# Patient Record
Sex: Male | Born: 1950 | Race: Black or African American | Hispanic: No | Marital: Married | Smoking: Never smoker
Health system: Southern US, Community
[De-identification: ages and names within clinical notes are randomized; demographics above are authoritative.]

## PROBLEM LIST (undated history)

## (undated) DIAGNOSIS — E119 Type 2 diabetes mellitus without complications: Secondary | ICD-10-CM

## (undated) DIAGNOSIS — G4730 Sleep apnea, unspecified: Secondary | ICD-10-CM

## (undated) DIAGNOSIS — C801 Malignant (primary) neoplasm, unspecified: Secondary | ICD-10-CM

## (undated) DIAGNOSIS — M109 Gout, unspecified: Secondary | ICD-10-CM

## (undated) DIAGNOSIS — E785 Hyperlipidemia, unspecified: Secondary | ICD-10-CM

## (undated) DIAGNOSIS — I10 Essential (primary) hypertension: Secondary | ICD-10-CM

## (undated) HISTORY — DX: Hyperlipidemia, unspecified: E78.5

## (undated) HISTORY — DX: Gout, unspecified: M10.9

## (undated) HISTORY — DX: Type 2 diabetes mellitus without complications: E11.9

## (undated) HISTORY — DX: Sleep apnea, unspecified: G47.30

## (undated) HISTORY — PX: CIRCUMCISION: SUR203

## (undated) HISTORY — PX: PROSTATE SURGERY: SHX751

## (undated) HISTORY — DX: Essential (primary) hypertension: I10

## (undated) HISTORY — DX: Malignant (primary) neoplasm, unspecified: C80.1

---

## 2001-05-22 HISTORY — PX: OTHER SURGICAL HISTORY: SHX169

## 2002-05-22 HISTORY — PX: TONSILLECTOMY AND ADENOIDECTOMY: SHX28

## 2004-10-24 ENCOUNTER — Other Ambulatory Visit: Payer: Self-pay

## 2004-10-24 ENCOUNTER — Emergency Department: Payer: Self-pay | Admitting: Emergency Medicine

## 2005-11-21 ENCOUNTER — Emergency Department: Payer: Self-pay | Admitting: Emergency Medicine

## 2006-04-10 ENCOUNTER — Ambulatory Visit: Payer: Self-pay | Admitting: Unknown Physician Specialty

## 2006-12-13 ENCOUNTER — Ambulatory Visit: Payer: Self-pay | Admitting: Unknown Physician Specialty

## 2009-02-08 ENCOUNTER — Ambulatory Visit: Payer: Self-pay | Admitting: Family Medicine

## 2009-03-30 ENCOUNTER — Emergency Department: Payer: Self-pay | Admitting: Emergency Medicine

## 2010-04-04 ENCOUNTER — Emergency Department: Payer: Self-pay | Admitting: Emergency Medicine

## 2010-04-20 ENCOUNTER — Ambulatory Visit: Payer: Self-pay | Admitting: Urology

## 2010-05-10 ENCOUNTER — Ambulatory Visit: Payer: Self-pay | Admitting: Gastroenterology

## 2010-05-18 LAB — PATHOLOGY REPORT

## 2010-06-28 ENCOUNTER — Ambulatory Visit: Payer: Self-pay | Admitting: Unknown Physician Specialty

## 2011-05-23 DIAGNOSIS — C801 Malignant (primary) neoplasm, unspecified: Secondary | ICD-10-CM

## 2011-05-23 HISTORY — PX: PROSTATECTOMY: SHX69

## 2011-05-23 HISTORY — DX: Malignant (primary) neoplasm, unspecified: C80.1

## 2012-09-25 ENCOUNTER — Ambulatory Visit: Payer: Self-pay | Admitting: Radiation Oncology

## 2012-09-26 LAB — PSA: PSA: <0.1 ng/mL (ref 0.0–4.0)

## 2012-10-17 LAB — CBC CANCER CENTER
Basophil #: 0.1 x10 3/mm (ref 0.0–0.1)
Basophil %: 0.8 %
Eosinophil #: 0.2 x10 3/mm (ref 0.0–0.7)
Eosinophil %: 2.7 %
HCT: 34.7 % — ABNORMAL LOW (ref 40.0–52.0)
HGB: 11.5 g/dL — ABNORMAL LOW (ref 13.0–18.0)
Lymphocyte #: 1.8 x10 3/mm (ref 1.0–3.6)
Lymphocyte %: 20.2 %
MCH: 27.2 pg (ref 26.0–34.0)
MCHC: 33.1 g/dL (ref 32.0–36.0)
MCV: 82 fL (ref 80–100)
Monocyte #: 0.8 x10 3/mm (ref 0.2–1.0)
Monocyte %: 9.1 %
Neutrophil #: 6 x10 3/mm (ref 1.4–6.5)
Neutrophil %: 67.2 %
Platelet: 241 x10 3/mm (ref 150–440)
RBC: 4.22 10*6/uL — ABNORMAL LOW (ref 4.40–5.90)
RDW: 16.7 % — ABNORMAL HIGH (ref 11.5–14.5)
WBC: 8.9 x10 3/mm (ref 3.8–10.6)

## 2012-10-20 ENCOUNTER — Ambulatory Visit: Payer: Self-pay | Admitting: Radiation Oncology

## 2012-10-24 LAB — CBC CANCER CENTER
Basophil %: 1 %
Eosinophil #: 0.3 x10 3/mm (ref 0.0–0.7)
Eosinophil %: 4.1 %
HCT: 36 % — ABNORMAL LOW (ref 40.0–52.0)
Lymphocyte #: 1.4 x10 3/mm (ref 1.0–3.6)
Monocyte %: 8.5 %
Neutrophil %: 67.7 %
Platelet: 231 x10 3/mm (ref 150–440)
RDW: 17.6 % — ABNORMAL HIGH (ref 11.5–14.5)
WBC: 7.4 x10 3/mm (ref 3.8–10.6)

## 2012-10-26 ENCOUNTER — Emergency Department: Payer: Self-pay | Admitting: Emergency Medicine

## 2012-10-26 LAB — CBC WITH DIFFERENTIAL/PLATELET
Eosinophil #: 0.1 10*3/uL (ref 0.0–0.7)
Lymphocyte %: 6.4 %
MCHC: 33.5 g/dL (ref 32.0–36.0)
Monocyte #: 1.2 x10 3/mm — ABNORMAL HIGH (ref 0.2–1.0)
Monocyte %: 8.1 %
Neutrophil #: 12.1 10*3/uL — ABNORMAL HIGH (ref 1.4–6.5)
RBC: 4.22 10*6/uL — ABNORMAL LOW (ref 4.40–5.90)
RDW: 17.8 % — ABNORMAL HIGH (ref 11.5–14.5)

## 2012-10-26 LAB — URINALYSIS, COMPLETE
Bilirubin,UR: NEGATIVE
Glucose,UR: NEGATIVE mg/dL (ref 0–75)
Nitrite: NEGATIVE
Protein: NEGATIVE
RBC,UR: 11 /HPF (ref 0–5)
Specific Gravity: 1.014 (ref 1.003–1.030)
Squamous Epithelial: <1
WBC UR: 38 /HPF (ref 0–5)

## 2012-10-26 LAB — BASIC METABOLIC PANEL
BUN: 24 mg/dL — ABNORMAL HIGH (ref 7–18)
Calcium, Total: 9.2 mg/dL (ref 8.5–10.1)
Chloride: 104 mmol/L (ref 98–107)
Co2: 27 mmol/L (ref 21–32)
Creatinine: 1.49 mg/dL — ABNORMAL HIGH (ref 0.60–1.30)
EGFR (Non-African Amer.): 50 — ABNORMAL LOW
Osmolality: 280 (ref 275–301)
Potassium: 3.9 mmol/L (ref 3.5–5.1)

## 2012-10-29 LAB — URINE CULTURE

## 2012-10-31 LAB — CBC CANCER CENTER
Basophil #: 0 x10 3/mm (ref 0.0–0.1)
Eosinophil #: 0.2 x10 3/mm (ref 0.0–0.7)
HGB: 11.4 g/dL — ABNORMAL LOW (ref 13.0–18.0)
Lymphocyte #: 1.2 x10 3/mm (ref 1.0–3.6)
MCH: 28.1 pg (ref 26.0–34.0)
MCHC: 34.3 g/dL (ref 32.0–36.0)
MCV: 82 fL (ref 80–100)
Neutrophil #: 4.7 x10 3/mm (ref 1.4–6.5)
Platelet: 237 x10 3/mm (ref 150–440)
RDW: 17.1 % — ABNORMAL HIGH (ref 11.5–14.5)

## 2012-11-01 LAB — CULTURE, BLOOD (SINGLE)

## 2012-11-07 LAB — CBC CANCER CENTER
Eosinophil %: 3.4 %
HCT: 34.1 % — ABNORMAL LOW (ref 40.0–52.0)
HGB: 11.6 g/dL — ABNORMAL LOW (ref 13.0–18.0)
Lymphocyte #: 1.1 x10 3/mm (ref 1.0–3.6)
MCHC: 34 g/dL (ref 32.0–36.0)
MCV: 83 fL (ref 80–100)
Monocyte #: 0.6 x10 3/mm (ref 0.2–1.0)
Monocyte %: 7.8 %
Platelet: 246 x10 3/mm (ref 150–440)
RBC: 4.11 10*6/uL — ABNORMAL LOW (ref 4.40–5.90)
RDW: 17.7 % — ABNORMAL HIGH (ref 11.5–14.5)
WBC: 7.1 x10 3/mm (ref 3.8–10.6)

## 2012-11-14 LAB — CBC CANCER CENTER
Basophil #: 0 x10 3/mm (ref 0.0–0.1)
Eosinophil %: 2.8 %
HCT: 34.5 % — ABNORMAL LOW (ref 40.0–52.0)
Lymphocyte #: 0.8 x10 3/mm — ABNORMAL LOW (ref 1.0–3.6)
MCH: 28.3 pg (ref 26.0–34.0)
MCHC: 34.1 g/dL (ref 32.0–36.0)
MCV: 83 fL (ref 80–100)
Monocyte %: 8.9 %
Platelet: 211 x10 3/mm (ref 150–440)
RDW: 17.9 % — ABNORMAL HIGH (ref 11.5–14.5)
WBC: 5.9 x10 3/mm (ref 3.8–10.6)

## 2012-11-19 ENCOUNTER — Ambulatory Visit: Payer: Self-pay | Admitting: Radiation Oncology

## 2012-11-21 LAB — CBC CANCER CENTER
Basophil %: 0.6 %
HCT: 33.9 % — ABNORMAL LOW (ref 40.0–52.0)
HGB: 11.5 g/dL — ABNORMAL LOW (ref 13.0–18.0)
Lymphocyte #: 0.9 x10 3/mm — ABNORMAL LOW (ref 1.0–3.6)
MCH: 28.5 pg (ref 26.0–34.0)
MCV: 84 fL (ref 80–100)
Monocyte %: 11.4 %
Neutrophil #: 4.8 x10 3/mm (ref 1.4–6.5)
Neutrophil %: 72.5 %
Platelet: 180 x10 3/mm (ref 150–440)
RBC: 4.05 10*6/uL — ABNORMAL LOW (ref 4.40–5.90)
WBC: 6.6 x10 3/mm (ref 3.8–10.6)

## 2012-11-28 LAB — CBC CANCER CENTER
Basophil #: 0 x10 3/mm (ref 0.0–0.1)
Basophil %: 0.6 %
Eosinophil #: 0.2 x10 3/mm (ref 0.0–0.7)
Lymphocyte #: 0.8 x10 3/mm — ABNORMAL LOW (ref 1.0–3.6)
MCV: 84 fL (ref 80–100)
Monocyte #: 0.7 x10 3/mm (ref 0.2–1.0)
Neutrophil #: 6.2 x10 3/mm (ref 1.4–6.5)
RDW: 17.5 % — ABNORMAL HIGH (ref 11.5–14.5)
WBC: 7.9 x10 3/mm (ref 3.8–10.6)

## 2012-12-20 ENCOUNTER — Ambulatory Visit: Payer: Self-pay | Admitting: Radiation Oncology

## 2013-05-06 ENCOUNTER — Ambulatory Visit: Payer: Self-pay | Admitting: Radiation Oncology

## 2013-05-08 LAB — PSA: PSA: <0.1 ng/mL (ref 0.0–4.0)

## 2013-05-22 ENCOUNTER — Ambulatory Visit: Payer: Self-pay | Admitting: Radiation Oncology

## 2013-05-22 HISTORY — PX: CYST EXCISION: SHX5701

## 2013-11-04 ENCOUNTER — Ambulatory Visit: Payer: Self-pay | Admitting: Radiation Oncology

## 2013-11-06 LAB — PSA: PSA: <0.1 ng/mL (ref 0.0–4.0)

## 2013-11-19 ENCOUNTER — Ambulatory Visit: Payer: Self-pay | Admitting: Radiation Oncology

## 2014-05-22 HISTORY — PX: TOTAL HIP ARTHROPLASTY: SHX124

## 2014-05-22 HISTORY — PX: JOINT REPLACEMENT: SHX530

## 2014-09-11 NOTE — Consult Note (Signed)
Reason for Visit: This 64 year old Male patient presents to the clinic for initial evaluation of  prostate cancer .   Referred by to Dr. Arrie Senate Department of radiation oncology.  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 Bernardo Heater for several years for rising PSA which began 2000 well. Eventually came to biopsy was found to have Gleason score of 7 (4+3) adenocarcinoma of the prostate. Was seen at [facility] and underwent a laparoscopic assisted robotic prostatectomy in June 07, 2012. 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 Patient has had postop urinary incontinence improving over time although still uses a pad. He was seen by radiation oncology at [facility] and recommended to have salvage radiation therapy. He is seen today closer to home for consultation. He has not had postop PSA check. He is doing fairly well. He is having no diarrhea. Still has urinary incontinence as described above. Does have trouble obtaining an erection at this time. This  Past Hx:    Prostate Cancer:    Hypercholesterolemia:    Diabetes:    htn:    Tonsillectomy:   Past, Family and Social History:  Past Medical History positive   Cardiovascular hyperlipidemia; hypertension   Endocrine diabetes mellitus   Past Surgical History tonsillectomy   Past Medical History Comments morbid obesity   Family History positive   Family History Comments family history positive for lung cancer, prostate cancer breast cancer and hypertension.   Social  History noncontributory   Additional Past Medical and Surgical History seen by himself today   Allergies:   No Known Allergies:   Home Meds:  Home Medications: Medication Instructions Status  Humulin 70/30 subcutaneous suspension 100 units subcutaneous 2 times a day  Active  ibuprofen 800 mg oral tablet 1 tab(s) orally 3 times a day as needed for pain.  Active  carvedilol 25 mg oral tablet 1   2 times a day Active  simvastatin 20 mg oral tablet 1 tab(s) orally once a day (at bedtime) Active  clonidine 0.2 mg oral tablet 1  orally 2 times a day Active  Levemir 100 units/mL subcutaneous solution 50 unit(s) subcutaneous once a day (at bedtime) Active  allopurinol 300 mg oral tablet 1 tab(s) orally once a day Active  Tribenzor 10 mg-25 mg-40 mg oral tablet 1 tab(s) orally once a day Active   Review of Systems:  General negative   Performance Status (ECOG) 0   Skin negative   Breast negative   Ophthalmologic negative   ENMT negative   Respiratory and Thorax negative   Cardiovascular negative   Gastrointestinal negative   Genitourinary see HPI   Musculoskeletal negative   Neurological negative   Hematology/Lymphatics negative   Endocrine negative   Allergic/Immunologic negative   Nursing Notes:  Nursing Vital Signs and Chemo Nursing Nursing Notes: *CC Vital Signs Flowsheet:   07-May-14 14:12  Temp Temperature 99.6  Pulse Pulse 84  Respirations Respirations 20  SBP SBP 161  DBP DBP 86  Pain Scale (0-10)  0  Current Weight (kg) (kg) 145.8  Height (  cm) centimeters 188  BSA (m2) 2.6   Physical Exam:  General/Skin/HEENT:  Skin normal   Eyes normal   ENMT normal   Head and Neck normal   Additional PE well-developed morbidly obese male in NAD. No cervical or supraclavicular adenopathy is identified lungs are clear to A&P cardiac examination shows regular rate and rhythm. Abdomen is obese no organomegaly or masses are noted. On rectal exam rectal  sphincter tone is good. Prostate fossa is clear without evidence of nodularity or mass.   Breasts/Resp/CV/GI/GU:  Respiratory and Thorax normal   Cardiovascular normal   Gastrointestinal normal   Genitourinary normal   MS/Neuro/Psych/Lymph:  Musculoskeletal normal   Neurological normal   Lymphatics normal   Assessment and Plan: Impression:   locally advanced stage III adenocarcinoma prostate status post prostatectomy in 64 year old male Plan:   at this time I am attempting to obtain the official pathology report as well as operative report and clinical notes from urology at [facility]. From what I am reviewing at this time do not see an indication for treating his pelvic nodes. Would follow [place] guidelines and treat his prostatic fossa up to approximately 7000 cGy over 7 weeks using IMRT treatment planning and delivery. I've also obtained a PSA on him today. Risks and benefits of treatment including possible worsening of his urinary incontinence, increasing in severity of frequency and urgency and nocturia, possible diarrhea, possible fatigue, and possible alteration of blood counts were all explained in detail to the patient. He seems to comprehend her treatment plan well. I have set him up for CT simulation early next week.  I would like to take this opportunity to thank you for allowing me to continue to participate in this patient's care.  CC Referral:  cc: Dr. Bridgett Larsson Department radiation oncology [facility], Dr. Lottie Rater Department of urology [facility], Dr. Bernardo Heater, Dr. Golden Pop   Electronic Signatures: Armstead Peaks (MD)  (Signed 07-May-14 15:23)  Authored: HPI, Diagnosis, Past Hx, PFSH, Allergies, Home Meds, ROS, Nursing Notes, Physical Exam, Encounter Assessment and Plan, CC Referring Physician   Last Updated: 07-May-14 15:23 by Armstead Peaks (MD)

## 2014-11-03 ENCOUNTER — Other Ambulatory Visit: Payer: Self-pay | Admitting: Family Medicine

## 2014-11-09 ENCOUNTER — Other Ambulatory Visit: Payer: Self-pay | Admitting: *Deleted

## 2014-11-09 ENCOUNTER — Ambulatory Visit: Payer: BLUE CROSS/BLUE SHIELD | Attending: Radiation Oncology | Admitting: Radiation Oncology

## 2014-11-09 ENCOUNTER — Inpatient Hospital Stay: Payer: BLUE CROSS/BLUE SHIELD | Attending: Radiation Oncology

## 2014-11-09 DIAGNOSIS — C61 Malignant neoplasm of prostate: Secondary | ICD-10-CM

## 2014-11-16 DIAGNOSIS — E785 Hyperlipidemia, unspecified: Secondary | ICD-10-CM | POA: Insufficient documentation

## 2014-11-16 DIAGNOSIS — I10 Essential (primary) hypertension: Secondary | ICD-10-CM | POA: Insufficient documentation

## 2014-11-16 DIAGNOSIS — E1159 Type 2 diabetes mellitus with other circulatory complications: Secondary | ICD-10-CM | POA: Insufficient documentation

## 2014-11-16 DIAGNOSIS — I152 Hypertension secondary to endocrine disorders: Secondary | ICD-10-CM | POA: Insufficient documentation

## 2014-11-16 DIAGNOSIS — E1169 Type 2 diabetes mellitus with other specified complication: Secondary | ICD-10-CM | POA: Insufficient documentation

## 2014-11-17 ENCOUNTER — Ambulatory Visit (INDEPENDENT_AMBULATORY_CARE_PROVIDER_SITE_OTHER): Payer: BLUE CROSS/BLUE SHIELD | Admitting: Family Medicine

## 2014-11-17 ENCOUNTER — Encounter: Payer: Self-pay | Admitting: Family Medicine

## 2014-11-17 VITALS — BP 134/74 | HR 73 | Temp 98.4°F | Ht 74.5 in | Wt 328.0 lb

## 2014-11-17 DIAGNOSIS — R310 Gross hematuria: Secondary | ICD-10-CM

## 2014-11-17 DIAGNOSIS — R319 Hematuria, unspecified: Secondary | ICD-10-CM

## 2014-11-17 LAB — UA/M W/RFLX CULTURE, ROUTINE
Bilirubin, UA: NEGATIVE
GLUCOSE, UA: NEGATIVE
KETONES UA: NEGATIVE
Leukocytes, UA: NEGATIVE
Nitrite, UA: NEGATIVE
Protein, UA: NEGATIVE
SPEC GRAV UA: 1.02 (ref 1.005–1.030)
UUROB: 0.2 mg/dL (ref 0.2–1.0)
pH, UA: 6 (ref 5.0–7.5)

## 2014-11-17 LAB — MICROSCOPIC EXAMINATION: WBC, UA: NONE SEEN /hpf (ref 0–?)

## 2014-11-17 NOTE — Progress Notes (Signed)
   BP 134/74 mmHg  Pulse 73  Temp(Src) 98.4 F (36.9 C)  Ht 6' 2.5" (1.892 m)  Wt 328 lb (148.78 kg)  BMI 41.56 kg/m2  SpO2 98%   Subjective:    Patient ID: Aaron [name], male    DOB: 1951/04/21, 64 y.o.   MRN: [id number]  HPI: Aaron [name] is a 64 y.o. male  Chief Complaint  Patient presents with  . Hematuria  Painless blood with no sx 2 times yesterday blood gross and in PM no blood No blood in semen  No back pain or GU pain  No trauma Had after prostate cancer surger and tx for UTI Had radiation 35 tx No outside lesions No UTI sx   Relevant past medical, surgical, family and social history reviewed and updated as indicated. Interim medical history since our last visit reviewed. Allergies and medications reviewed and updated.  Review of Systems  Constitutional: Negative.   Respiratory: Negative.   Cardiovascular: Negative.     Per HPI unless specifically indicated above     Objective:    BP 134/74 mmHg  Pulse 73  Temp(Src) 98.4 F (36.9 C)  Ht 6' 2.5" (1.892 m)  Wt 328 lb (148.78 kg)  BMI 41.56 kg/m2  SpO2 98%  Wt Readings from Last 3 Encounters:  11/17/14 328 lb (148.78 kg)  09/24/14 330 lb (149.687 kg)    Physical Exam  Constitutional: He is oriented to person, place, and time. He appears well-developed and well-nourished. No distress.  HENT:  Head: Normocephalic and atraumatic.  Right Ear: Hearing normal.  Left Ear: Hearing normal.  Nose: Nose normal.  Eyes: Conjunctivae and lids are normal. Right eye exhibits no discharge. Left eye exhibits no discharge. No scleral icterus.  Pulmonary/Chest: Effort normal. No respiratory distress.  Genitourinary: Testes normal and penis normal. No phimosis, penile erythema or penile tenderness. No discharge found.  Musculoskeletal: Normal range of motion.  Neurological: He is alert and oriented to person, place, and time.  Skin: Skin is intact. No rash noted.  Psychiatric: He has a normal mood and  affect. His speech is normal and behavior is normal. Judgment and thought content normal. Cognition and memory are normal.    Results for orders placed or performed in visit on 11/04/13  PSA  Result Value Ref Range   PSA <0.1 0.0-4.0 ng/mL      Assessment & Plan:   Problem List Items Addressed This Visit    None    Visit Diagnoses    Hematuria    -  Primary    Relevant Orders    Urinalysis, Routine w reflex microscopic    Urine culture    Ambulatory referral to Urology    Hematuria, gross        pt with no signs or sx now with hx radiation most 7 old radiation injury. will get urine culter and referral to his urology [facility]        Follow up plan: Return if symptoms worsen or fail to improve.

## 2014-11-26 LAB — URINE CULTURE

## 2014-11-30 ENCOUNTER — Other Ambulatory Visit: Payer: Self-pay

## 2014-11-30 ENCOUNTER — Other Ambulatory Visit: Payer: BLUE CROSS/BLUE SHIELD

## 2014-11-30 DIAGNOSIS — R319 Hematuria, unspecified: Secondary | ICD-10-CM

## 2014-12-01 ENCOUNTER — Telehealth: Payer: Self-pay

## 2014-12-02 LAB — URINE CULTURE

## 2014-12-21 DIAGNOSIS — R310 Gross hematuria: Secondary | ICD-10-CM | POA: Insufficient documentation

## 2015-01-05 ENCOUNTER — Telehealth: Payer: Self-pay

## 2015-01-05 ENCOUNTER — Encounter: Payer: Self-pay | Admitting: Family Medicine

## 2015-01-05 ENCOUNTER — Ambulatory Visit (INDEPENDENT_AMBULATORY_CARE_PROVIDER_SITE_OTHER): Payer: BLUE CROSS/BLUE SHIELD | Admitting: Family Medicine

## 2015-01-05 VITALS — BP 137/71 | HR 68 | Temp 98.2°F | Ht 74.2 in | Wt 333.0 lb

## 2015-01-05 DIAGNOSIS — E119 Type 2 diabetes mellitus without complications: Secondary | ICD-10-CM

## 2015-01-05 DIAGNOSIS — I10 Essential (primary) hypertension: Secondary | ICD-10-CM

## 2015-01-05 DIAGNOSIS — E785 Hyperlipidemia, unspecified: Secondary | ICD-10-CM

## 2015-01-05 LAB — LP+ALT+AST PICCOLO, WAIVED
ALT (SGPT) PICCOLO, WAIVED: 20 U/L (ref 10–47)
AST (SGOT) PICCOLO, WAIVED: 23 U/L (ref 11–38)
Chol/HDL Ratio Piccolo,Waive: 3.7 mg/dL
Cholesterol Piccolo, Waived: 99 mg/dL (ref <200)
HDL CHOL PICCOLO, WAIVED: 27 mg/dL — AB (ref >59)
LDL Chol Calc Piccolo Waived: 37 mg/dL (ref <100)
Triglycerides Piccolo,Waived: 177 mg/dL — ABNORMAL HIGH (ref <150)
VLDL Chol Calc Piccolo,Waive: 35 mg/dL — ABNORMAL HIGH (ref <30)

## 2015-01-05 LAB — MICROALBUMIN, URINE WAIVED
Creatinine, Urine Waived: 300 mg/dL (ref 10–300)
MICROALB, UR WAIVED: 30 mg/L — AB (ref 0–19)

## 2015-01-05 LAB — BAYER DCA HB A1C WAIVED: HB A1C: 8.2 % — AB (ref <7.0)

## 2015-01-05 MED ORDER — ALLOPURINOL 300 MG PO TABS: 300.0000 mg | ORAL_TABLET | Freq: Every day | ORAL | Status: DC

## 2015-01-05 MED ORDER — DICLOFENAC SODIUM 75 MG PO TBEC: 75.0000 mg | DELAYED_RELEASE_TABLET | Freq: Two times a day (BID) | ORAL | Status: DC

## 2015-01-05 MED ORDER — ALBIGLUTIDE 50 MG ~~LOC~~ PEN: 50.0000 mg | PEN_INJECTOR | SUBCUTANEOUS | Status: DC

## 2015-01-05 MED ORDER — HYDROCHLOROTHIAZIDE 25 MG PO TABS: 25.0000 mg | ORAL_TABLET | Freq: Every day | ORAL | Status: DC

## 2015-01-05 MED ORDER — DAPAGLIFLOZIN PROPANEDIOL 10 MG PO TABS: 10.0000 mg | ORAL_TABLET | Freq: Every day | ORAL | Status: DC

## 2015-01-05 MED ORDER — INSULIN DETEMIR 100 UNIT/ML FLEXPEN: 60.0000 [IU] | PEN_INJECTOR | Freq: Every day | SUBCUTANEOUS | Status: DC

## 2015-01-05 MED ORDER — INSULIN NPH ISOPHANE & REGULAR (70-30) 100 UNIT/ML ~~LOC~~ SUSP: 100.0000 [IU] | Freq: Two times a day (BID) | SUBCUTANEOUS | Status: DC

## 2015-01-05 MED ORDER — ALBIGLUTIDE 50 MG ~~LOC~~ PEN: 50.0000 mg | PEN_INJECTOR | Freq: Every day | SUBCUTANEOUS | Status: DC

## 2015-01-05 MED ORDER — SITAGLIPTIN PHOSPHATE 100 MG PO TABS: 100.0000 mg | ORAL_TABLET | Freq: Every day | ORAL | Status: DC

## 2015-01-05 MED ORDER — LOSARTAN POTASSIUM 100 MG PO TABS: 100.0000 mg | ORAL_TABLET | Freq: Every day | ORAL | Status: DC

## 2015-01-05 MED ORDER — AMLODIPINE BESYLATE 10 MG PO TABS: 10.0000 mg | ORAL_TABLET | Freq: Every day | ORAL | Status: DC

## 2015-01-05 MED ORDER — CARVEDILOL 25 MG PO TABS: 25.0000 mg | ORAL_TABLET | Freq: Two times a day (BID) | ORAL | Status: DC

## 2015-01-05 MED ORDER — ATORVASTATIN CALCIUM 20 MG PO TABS: 20.0000 mg | ORAL_TABLET | Freq: Every day | ORAL | Status: DC

## 2015-01-05 NOTE — Assessment & Plan Note (Signed)
Diabetes with poor control hemoglobin A1c of 8.2 today Will double  down on diet add Farxiga Discuss adjusting daytime insulin

## 2015-01-05 NOTE — Progress Notes (Signed)
BP 137/71 mmHg  Pulse 68  Temp(Src) 98.2 F (36.8 C)  Ht 6' 2.2" (1.885 m)  Wt 333 lb (151.048 kg)  BMI 42.51 kg/m2  SpO2 97%   Subjective:    Patient ID: Aaron [name], male    DOB: 1950/08/26, 64 y.o.   MRN: [id number]  HPI: Aaron [name] is a 64 y.o. male  Chief Complaint  Patient presents with  . Diabetes  . Hyperlipidemia  . Hypertension   Patient follow-up medical problems has been doing stable no complaints from medications no side effects takes faithfully. Diabetes fasting glucose around low 100s. Uses Levemir 50 units. Novolin 70/30 100 units twice a day and blood sugars after meals around mid to upper 100s.  Blood pressure lipids no complaints no problems with medicines and no gout symptoms.  Relevant past medical, surgical, family and social history reviewed and updated as indicated. Interim medical history since our last visit reviewed. Allergies and medications reviewed and updated.  Review of Systems  Constitutional: Negative.   Respiratory: Negative.   Cardiovascular: Negative.     Per HPI unless specifically indicated above     Objective:    BP 137/71 mmHg  Pulse 68  Temp(Src) 98.2 F (36.8 C)  Ht 6' 2.2" (1.885 m)  Wt 333 lb (151.048 kg)  BMI 42.51 kg/m2  SpO2 97%  Wt Readings from Last 3 Encounters:  01/05/15 333 lb (151.048 kg)  11/17/14 328 lb (148.78 kg)  09/24/14 330 lb (149.687 kg)    Physical Exam  Constitutional: He is oriented to person, place, and time. He appears well-developed and well-nourished. No distress.  HENT:  Head: Normocephalic and atraumatic.  Right Ear: Hearing normal.  Left Ear: Hearing normal.  Nose: Nose normal.  Eyes: Conjunctivae and lids are normal. Right eye exhibits no discharge. Left eye exhibits no discharge. No scleral icterus.  Cardiovascular: Normal rate, regular rhythm and normal heart sounds.   Pulmonary/Chest: Effort normal and breath sounds normal. No respiratory distress.   Musculoskeletal: Normal range of motion.  Neurological: He is alert and oriented to person, place, and time.  Skin: Skin is intact. No rash noted.  Psychiatric: He has a normal mood and affect. His speech is normal and behavior is normal. Judgment and thought content normal. Cognition and memory are normal.    Results for orders placed or performed in visit on 11/30/14  Urine Culture  Result Value Ref Range   Urine Culture, Routine Final report (A)    Result 1 Enterococcus species (A)    ANTIMICROBIAL SUSCEPTIBILITY Comment       Assessment & Plan:   Problem List Items Addressed This Visit      Cardiovascular and Mediastinum   Hypertension    The current medical regimen is effective;  continue present plan and medications.       Relevant Medications   amLODipine (NORVASC) 10 MG tablet   atorvastatin (LIPITOR) 20 MG tablet   carvedilol (COREG) 25 MG tablet   hydrochlorothiazide (HYDRODIURIL) 25 MG tablet   losartan (COZAAR) 100 MG tablet     Endocrine   Diabetes mellitus without complication - Primary    Diabetes with poor control hemoglobin A1c of 8.2 today Will double  down on diet add Farxiga Discuss adjusting daytime insulin      Relevant Medications   Albiglutide (TANZEUM) 50 MG PEN   atorvastatin (LIPITOR) 20 MG tablet   Insulin Detemir (LEVEMIR FLEXPEN) 100 UNIT/ML Pen   insulin NPH-regular Human (NOVOLIN 70/30) (  70-30) 100 UNIT/ML injection   losartan (COZAAR) 100 MG tablet   sitaGLIPtin (JANUVIA) 100 MG tablet   dapagliflozin propanediol (FARXIGA) 10 MG TABS tablet   Other Relevant Orders   Bayer DCA Hb A1c Waived   Microalbumin, Urine Waived     Other   Hyperlipidemia    The current medical regimen is effective;  continue present plan and medications.       Relevant Medications   amLODipine (NORVASC) 10 MG tablet   atorvastatin (LIPITOR) 20 MG tablet   carvedilol (COREG) 25 MG tablet   hydrochlorothiazide (HYDRODIURIL) 25 MG tablet   losartan  (COZAAR) 100 MG tablet    Other Visit Diagnoses    Hyperlipemia        Relevant Medications    amLODipine (NORVASC) 10 MG tablet    atorvastatin (LIPITOR) 20 MG tablet    carvedilol (COREG) 25 MG tablet    hydrochlorothiazide (HYDRODIURIL) 25 MG tablet    losartan (COZAAR) 100 MG tablet    Other Relevant Orders    LP+ALT+AST Piccolo, Waived    Essential hypertension, benign        Relevant Medications    amLODipine (NORVASC) 10 MG tablet    atorvastatin (LIPITOR) 20 MG tablet    carvedilol (COREG) 25 MG tablet    hydrochlorothiazide (HYDRODIURIL) 25 MG tablet    losartan (COZAAR) 100 MG tablet    Other Relevant Orders    Basic metabolic panel    Microalbumin, Urine Waived        Follow up plan: Return in about 3 months (around 04/07/2015), or if symptoms worsen or fail to improve, for Physical Exam.

## 2015-01-05 NOTE — Telephone Encounter (Signed)
Requesting a 90 day Rx for patient's Levemir And also need Instructions for Tanzeum Pen - Resend including new directions

## 2015-01-05 NOTE — Assessment & Plan Note (Signed)
The current medical regimen is effective;  continue present plan and medications.  

## 2015-01-05 NOTE — Telephone Encounter (Signed)
New rx for Novolin vial 70/30 mix

## 2015-01-05 NOTE — Telephone Encounter (Signed)
Need 90 day quantity - new rx  [facility]

## 2015-01-06 ENCOUNTER — Encounter: Payer: Self-pay | Admitting: Family Medicine

## 2015-01-06 ENCOUNTER — Telehealth: Payer: Self-pay | Admitting: Family Medicine

## 2015-01-06 ENCOUNTER — Other Ambulatory Visit: Payer: Self-pay | Admitting: Family Medicine

## 2015-01-06 DIAGNOSIS — E119 Type 2 diabetes mellitus without complications: Secondary | ICD-10-CM

## 2015-01-06 LAB — BASIC METABOLIC PANEL
BUN / CREAT RATIO: 12 (ref 10–22)
BUN: 13 mg/dL (ref 8–27)
CALCIUM: 9 mg/dL (ref 8.6–10.2)
CO2: 27 mmol/L (ref 18–29)
CREATININE: 1.06 mg/dL (ref 0.76–1.27)
Chloride: 101 mmol/L (ref 97–108)
GFR calc Af Amer: 87 mL/min/{1.73_m2} (ref >59)
GFR calc non Af Amer: 75 mL/min/{1.73_m2} (ref >59)
GLUCOSE: 107 mg/dL — AB (ref 65–99)
Potassium: 4.1 mmol/L (ref 3.5–5.2)
Sodium: 142 mmol/L (ref 134–144)

## 2015-01-06 MED ORDER — INSULIN NPH ISOPHANE & REGULAR (70-30) 100 UNIT/ML ~~LOC~~ SUSP: 100.0000 [IU] | Freq: Two times a day (BID) | SUBCUTANEOUS | Status: DC

## 2015-01-06 NOTE — Telephone Encounter (Signed)
[facility] has a Careers information officer for [employer], 10 ml would only last pt 5 days. They would like to make sure this is the correct quantity. Thanks.

## 2015-01-12 ENCOUNTER — Other Ambulatory Visit: Payer: Self-pay | Admitting: Family Medicine

## 2015-01-12 DIAGNOSIS — E119 Type 2 diabetes mellitus without complications: Secondary | ICD-10-CM

## 2015-01-12 DIAGNOSIS — M5440 Lumbago with sciatica, unspecified side: Secondary | ICD-10-CM

## 2015-01-12 MED ORDER — INSULIN DETEMIR 100 UNIT/ML FLEXPEN: 60.0000 [IU] | PEN_INJECTOR | Freq: Every day | SUBCUTANEOUS | Status: DC

## 2015-01-12 NOTE — Progress Notes (Signed)
Patient with note from Dr. Darlys Gales showing CT of lumbar spine with mottled appearance of the L1 vertebral body's recommendation for MRI. Patient in the past treated for prostate cancer. PSA is 0 so unlikely metastatic prostate cancer causing CT changes. We will contact the patient in order the MRI of lumbar spine

## 2015-01-22 ENCOUNTER — Ambulatory Visit
Admission: RE | Admit: 2015-01-22 | Discharge: 2015-01-22 | Disposition: A | Discharge status: Home / Self Care | Payer: BLUE CROSS/BLUE SHIELD | Source: Ambulatory Visit | Attending: Family Medicine | Admitting: Family Medicine

## 2015-01-22 ENCOUNTER — Telehealth: Payer: Self-pay | Admitting: Family Medicine

## 2015-01-22 DIAGNOSIS — M4806 Spinal stenosis, lumbar region: Secondary | ICD-10-CM | POA: Insufficient documentation

## 2015-01-22 DIAGNOSIS — D1800 Hemangioma unspecified site: Secondary | ICD-10-CM | POA: Insufficient documentation

## 2015-01-22 DIAGNOSIS — M5440 Lumbago with sciatica, unspecified side: Secondary | ICD-10-CM

## 2015-01-22 NOTE — Telephone Encounter (Signed)
See previous note from today not sure if got through?

## 2015-01-22 NOTE — Telephone Encounter (Signed)
Phone call with pt MRI with no metastatic or ca concern. Discussed DDD and DJD and wt loss posture etc. Will try to get current CT from [facility] for [facility] to review with MRI as per MRI note and request from Dr  [facility] office.

## 2015-02-01 NOTE — Telephone Encounter (Signed)
Can you please contact [facility] for CT results, thank you

## 2015-02-07 ENCOUNTER — Other Ambulatory Visit: Payer: Self-pay | Admitting: Unknown Physician Specialty

## 2015-02-07 NOTE — Telephone Encounter (Signed)
Your patient.  Thanks 

## 2015-04-26 ENCOUNTER — Ambulatory Visit (INDEPENDENT_AMBULATORY_CARE_PROVIDER_SITE_OTHER): Payer: BLUE CROSS/BLUE SHIELD | Admitting: Family Medicine

## 2015-04-26 ENCOUNTER — Encounter: Payer: Self-pay | Admitting: Family Medicine

## 2015-04-26 VITALS — BP 133/77 | HR 75 | Temp 98.9°F | Ht 73.8 in | Wt 326.0 lb

## 2015-04-26 DIAGNOSIS — I10 Essential (primary) hypertension: Secondary | ICD-10-CM

## 2015-04-26 DIAGNOSIS — E119 Type 2 diabetes mellitus without complications: Secondary | ICD-10-CM | POA: Diagnosis not present

## 2015-04-26 DIAGNOSIS — C61 Malignant neoplasm of prostate: Secondary | ICD-10-CM

## 2015-04-26 DIAGNOSIS — G4730 Sleep apnea, unspecified: Secondary | ICD-10-CM | POA: Diagnosis not present

## 2015-04-26 DIAGNOSIS — M109 Gout, unspecified: Secondary | ICD-10-CM | POA: Insufficient documentation

## 2015-04-26 DIAGNOSIS — Z113 Encounter for screening for infections with a predominantly sexual mode of transmission: Secondary | ICD-10-CM | POA: Diagnosis not present

## 2015-04-26 DIAGNOSIS — M1A0720 Idiopathic chronic gout, left ankle and foot, without tophus (tophi): Secondary | ICD-10-CM | POA: Diagnosis not present

## 2015-04-26 DIAGNOSIS — Z0000 Encounter for general adult medical examination without abnormal findings: Secondary | ICD-10-CM

## 2015-04-26 DIAGNOSIS — Z6841 Body Mass Index (BMI) 40.0 and over, adult: Secondary | ICD-10-CM | POA: Diagnosis not present

## 2015-04-26 DIAGNOSIS — E785 Hyperlipidemia, unspecified: Secondary | ICD-10-CM | POA: Diagnosis not present

## 2015-04-26 DIAGNOSIS — Z96649 Presence of unspecified artificial hip joint: Secondary | ICD-10-CM | POA: Insufficient documentation

## 2015-04-26 DIAGNOSIS — Z8546 Personal history of malignant neoplasm of prostate: Secondary | ICD-10-CM | POA: Insufficient documentation

## 2015-04-26 DIAGNOSIS — Z9660 Presence of unspecified orthopedic joint implant: Secondary | ICD-10-CM | POA: Diagnosis not present

## 2015-04-26 LAB — MICROSCOPIC EXAMINATION: WBC, UA: NONE SEEN /hpf (ref 0–?)

## 2015-04-26 LAB — URINALYSIS, ROUTINE W REFLEX MICROSCOPIC
Bilirubin, UA: NEGATIVE
KETONES UA: NEGATIVE
LEUKOCYTES UA: NEGATIVE
Nitrite, UA: NEGATIVE
Protein, UA: NEGATIVE
SPEC GRAV UA: 1.02 (ref 1.005–1.030)
Urobilinogen, Ur: 0.2 mg/dL (ref 0.2–1.0)
pH, UA: 5.5 (ref 5.0–7.5)

## 2015-04-26 LAB — BAYER DCA HB A1C WAIVED: HB A1C (BAYER DCA - WAIVED): 6.5 %

## 2015-04-26 NOTE — Assessment & Plan Note (Signed)
The current medical regimen is effective;  continue present plan and medications.  

## 2015-04-26 NOTE — Progress Notes (Signed)
BP 133/77 mmHg  Pulse 75  Temp(Src) 98.9 F (37.2 C)  Ht 6' 1.8" (1.875 m)  Wt 326 lb (147.873 kg)  BMI 42.06 kg/m2  SpO2 99%   Subjective:    Patient ID: Aaron [name], male    DOB: 08-Dec-1950, 64 y.o.   MRN: [id number]  HPI: Aaron [name] is a 64 y.o. male  Chief Complaint  Patient presents with  . Annual Exam   Patient doing well with medications noted low blood sugar spells doing well with medications taken faithfully  Relevant past medical, surgical, family and social history reviewed and updated as indicated. Interim medical history since our last visit reviewed. Allergies and medications reviewed and updated.  Review of Systems  Constitutional: Negative.   HENT: Negative.   Eyes: Negative.   Respiratory: Negative.   Cardiovascular: Negative.   Gastrointestinal: Negative.   Endocrine: Negative.   Genitourinary: Negative.   Musculoskeletal: Negative.   Skin: Negative.   Allergic/Immunologic: Negative.   Neurological: Negative.   Hematological: Negative.   Psychiatric/Behavioral: Negative.     Per HPI unless specifically indicated above     Objective:    BP 133/77 mmHg  Pulse 75  Temp(Src) 98.9 F (37.2 C)  Ht 6' 1.8" (1.875 m)  Wt 326 lb (147.873 kg)  BMI 42.06 kg/m2  SpO2 99%  Wt Readings from Last 3 Encounters:  04/26/15 326 lb (147.873 kg)  01/22/15 333 lb (151.048 kg)  01/05/15 333 lb (151.048 kg)    Physical Exam  Constitutional: He is oriented to person, place, and time. He appears well-developed and well-nourished.  HENT:  Head: Normocephalic and atraumatic.  Right Ear: External ear normal.  Left Ear: External ear normal.  Eyes: Conjunctivae and EOM are normal. Pupils are equal, round, and reactive to light.  Neck: Normal range of motion. Neck supple.  Cardiovascular: Normal rate, regular rhythm, normal heart sounds and intact distal pulses.   Pulmonary/Chest: Effort normal and breath sounds normal.  Abdominal: Soft. Bowel  sounds are normal. There is no splenomegaly or hepatomegaly.  Genitourinary: Rectum normal and penis normal.  Prostate removed  Musculoskeletal: Normal range of motion.  Neurological: He is alert and oriented to person, place, and time. He has normal reflexes.  Skin: No rash noted. No erythema.  Psychiatric: He has a normal mood and affect. His behavior is normal. Judgment and thought content normal.    Results for orders placed or performed in visit on 01/05/15  Bayer DCA Hb A1c Waived  Result Value Ref Range   Bayer DCA Hb A1c Waived 8.2 (H) <7.0 %  LP+ALT+AST Piccolo, Waived  Result Value Ref Range   ALT (SGPT) Piccolo, Waived 20 10 - 47 U/L   AST (SGOT) Piccolo, Waived 23 11 - 38 U/L   Cholesterol Piccolo, Waived 99 <200 mg/dL   HDL Chol Piccolo, Waived 27 (L) >59 mg/dL   Triglycerides Piccolo,Waived 177 (H) <150 mg/dL   Chol/HDL Ratio Piccolo,Waive 3.7 mg/dL   LDL Chol Calc Piccolo Waived 37 <100 mg/dL   VLDL Chol Calc Piccolo,Waive 35 (H) <30 mg/dL  Basic metabolic panel  Result Value Ref Range   Glucose 107 (H) 65 - 99 mg/dL   BUN 13 8 - 27 mg/dL   Creatinine, Ser 1.06 0.76 - 1.27 mg/dL   GFR calc non Af Amer 75 >59 mL/min/1.73   GFR calc Af Amer 87 >59 mL/min/1.73   BUN/Creatinine Ratio 12 10 - 22   Sodium 142 134 - 144 mmol/L  Potassium 4.1 3.5 - 5.2 mmol/L   Chloride 101 97 - 108 mmol/L   CO2 27 18 - 29 mmol/L   Calcium 9.0 8.6 - 10.2 mg/dL  Microalbumin, Urine Waived  Result Value Ref Range   Microalb, Ur Waived 30 (H) 0 - 19 mg/L   Creatinine, Urine Waived 300 10 - 300 mg/dL   Microalb/Creat Ratio <30 <30 mg/g      Assessment & Plan:   Problem List Items Addressed This Visit      Cardiovascular and Mediastinum   Hypertension    The current medical regimen is effective;  continue present plan and medications.         Endocrine   Diabetes mellitus without complication ([place])    The current medical regimen is effective;  continue present plan and  medications.       Relevant Orders   Bayer DCA Hb A1c Waived     Genitourinary   Prostate cancer [facility])     Other   Sleep apnea   S/P hip replacement   Hyperlipidemia    The current medical regimen is effective;  continue present plan and medications.       Gout   BMI 40.0-44.9, adult (HCC)    Discuss diet and exercise       Other Visit Diagnoses    Routine general medical examination at a health care facility    -  Primary    Relevant Orders    CBC with Differential/Platelet    Comprehensive metabolic panel    Lipid Panel w/o Chol/HDL Ratio    PSA    TSH    Urinalysis, Routine w reflex microscopic (not at [facility])    Routine screening for STI (sexually transmitted infection)        Relevant Orders    HIV antibody    Hepatitis C Antibody        Follow up plan: Return in about 3 months (around 07/25/2015) for a1c.

## 2015-04-26 NOTE — Assessment & Plan Note (Signed)
Discuss diet and exercise 

## 2015-04-27 ENCOUNTER — Encounter: Payer: Self-pay | Admitting: Family Medicine

## 2015-04-27 NOTE — Telephone Encounter (Signed)
done

## 2015-05-28 ENCOUNTER — Telehealth: Payer: Self-pay | Admitting: Family Medicine

## 2015-05-28 DIAGNOSIS — E119 Type 2 diabetes mellitus without complications: Secondary | ICD-10-CM

## 2015-05-28 MED ORDER — INSULIN DETEMIR 100 UNIT/ML FLEXPEN: 60.0000 [IU] | PEN_INJECTOR | Freq: Every day | SUBCUTANEOUS | Status: DC

## 2015-05-28 MED ORDER — INSULIN NPH ISOPHANE & REGULAR (70-30) 100 UNIT/ML ~~LOC~~ SUSP: SUBCUTANEOUS | Status: DC

## 2015-05-28 NOTE — Telephone Encounter (Signed)
NOVOLIN 70/30 (70-30) 100 UNIT/ML injection Insulin Detemir (LEVEMIR FLEXPEN) 100 UNIT/ML Pen Ibuprofen  [facility] on [address]  Patient needs refill on his medication for 90 day supply. He change insurance and this will cost him less.

## 2015-06-09 LAB — CBC WITH DIFFERENTIAL/PLATELET
BASOS: 0 %
Basophils Absolute: 0 10*3/uL (ref 0.0–0.2)
EOS (ABSOLUTE): 0.3 10*3/uL (ref 0.0–0.4)
EOS: 4 %
HEMATOCRIT: 35.7 % — AB (ref 37.5–51.0)
Hemoglobin: 11.4 g/dL — ABNORMAL LOW (ref 12.6–17.7)
IMMATURE GRANS (ABS): 0 10*3/uL (ref 0.0–0.1)
IMMATURE GRANULOCYTES: 0 %
LYMPHS: 14 %
Lymphocytes Absolute: 1.1 10*3/uL (ref 0.7–3.1)
MCH: 26.7 pg (ref 26.6–33.0)
MCHC: 31.9 g/dL (ref 31.5–35.7)
MCV: 84 fL (ref 79–97)
Monocytes Absolute: 0.7 10*3/uL (ref 0.1–0.9)
Monocytes: 9 %
NEUTROS ABS: 5.8 10*3/uL (ref 1.4–7.0)
NEUTROS PCT: 73 %
Platelets: 235 10*3/uL (ref 150–379)
RBC: 4.27 x10E6/uL (ref 4.14–5.80)
RDW: 16.4 % — ABNORMAL HIGH (ref 12.3–15.4)
WBC: 8 10*3/uL (ref 3.4–10.8)

## 2015-06-09 LAB — COMPREHENSIVE METABOLIC PANEL
ALK PHOS: 113 IU/L (ref 39–117)
ALT: 21 IU/L (ref 0–44)
AST: 14 IU/L (ref 0–40)
Albumin/Globulin Ratio: 1.4 (ref 1.1–2.5)
Albumin: 3.8 g/dL (ref 3.6–4.8)
BUN/Creatinine Ratio: 15 (ref 10–22)
BUN: 16 mg/dL (ref 8–27)
Bilirubin Total: 0.3 mg/dL (ref 0.0–1.2)
CALCIUM: 8.9 mg/dL (ref 8.6–10.2)
CO2: 25 mmol/L (ref 18–29)
CREATININE: 1.09 mg/dL (ref 0.76–1.27)
Chloride: 104 mmol/L (ref 97–106)
GFR calc Af Amer: 84 mL/min/{1.73_m2} (ref >59)
GFR, EST NON AFRICAN AMERICAN: 72 mL/min/{1.73_m2} (ref >59)
Globulin, Total: 2.8 g/dL (ref 1.5–4.5)
Glucose: 53 mg/dL — ABNORMAL LOW (ref 65–99)
POTASSIUM: 3.9 mmol/L (ref 3.5–5.2)
Sodium: 143 mmol/L (ref 136–144)
Total Protein: 6.6 g/dL (ref 6.0–8.5)

## 2015-06-09 LAB — TSH: TSH: 3.12 u[IU]/mL (ref 0.450–4.500)

## 2015-06-09 LAB — LIPID PANEL W/O CHOL/HDL RATIO
CHOLESTEROL TOTAL: 140 mg/dL (ref 100–199)
HDL: 30 mg/dL — ABNORMAL LOW (ref >39)
LDL Calculated: 78 mg/dL (ref 0–99)
Triglycerides: 160 mg/dL — ABNORMAL HIGH (ref 0–149)
VLDL Cholesterol Cal: 32 mg/dL (ref 5–40)

## 2015-06-09 LAB — HEPATITIS C ANTIBODY

## 2015-06-09 LAB — PSA: Prostate Specific Ag, Serum: <0.1 ng/mL (ref 0.0–4.0)

## 2015-06-09 LAB — HIV ANTIBODY (ROUTINE TESTING W REFLEX): HIV SCREEN 4TH GENERATION: NONREACTIVE

## 2015-06-22 ENCOUNTER — Telehealth: Payer: Self-pay | Admitting: Family Medicine

## 2015-06-22 DIAGNOSIS — E785 Hyperlipidemia, unspecified: Secondary | ICD-10-CM

## 2015-06-22 DIAGNOSIS — I10 Essential (primary) hypertension: Secondary | ICD-10-CM

## 2015-06-22 MED ORDER — LOSARTAN POTASSIUM 100 MG PO TABS: 100.0000 mg | ORAL_TABLET | Freq: Every day | ORAL | Status: DC

## 2015-06-22 MED ORDER — CARVEDILOL 25 MG PO TABS: 25.0000 mg | ORAL_TABLET | Freq: Two times a day (BID) | ORAL | Status: DC

## 2015-06-22 MED ORDER — HYDROCHLOROTHIAZIDE 25 MG PO TABS: 25.0000 mg | ORAL_TABLET | Freq: Every day | ORAL | Status: DC

## 2015-06-22 MED ORDER — ATORVASTATIN CALCIUM 20 MG PO TABS: 20.0000 mg | ORAL_TABLET | Freq: Every day | ORAL | Status: DC

## 2015-06-22 MED ORDER — AMLODIPINE BESYLATE 10 MG PO TABS: 10.0000 mg | ORAL_TABLET | Freq: Every day | ORAL | Status: DC

## 2015-06-22 NOTE — Telephone Encounter (Signed)
Pt has changed to [facility] [address] and would like losartan, amlodipine, atorvastatin, and hydochlorothiazide, carvedilol sent in with a 90 day supply.

## 2015-07-23 ENCOUNTER — Other Ambulatory Visit: Payer: Self-pay | Admitting: Family Medicine

## 2015-07-27 ENCOUNTER — Encounter: Payer: Self-pay | Admitting: Family Medicine

## 2015-07-27 ENCOUNTER — Telehealth: Payer: Self-pay | Admitting: Family Medicine

## 2015-07-27 ENCOUNTER — Ambulatory Visit (INDEPENDENT_AMBULATORY_CARE_PROVIDER_SITE_OTHER): Payer: BLUE CROSS/BLUE SHIELD | Admitting: Family Medicine

## 2015-07-27 VITALS — BP 109/64 | HR 64 | Temp 98.5°F | Ht 74.2 in | Wt 317.0 lb

## 2015-07-27 DIAGNOSIS — E785 Hyperlipidemia, unspecified: Secondary | ICD-10-CM | POA: Diagnosis not present

## 2015-07-27 DIAGNOSIS — I10 Essential (primary) hypertension: Secondary | ICD-10-CM | POA: Diagnosis not present

## 2015-07-27 DIAGNOSIS — E119 Type 2 diabetes mellitus without complications: Secondary | ICD-10-CM

## 2015-07-27 DIAGNOSIS — M1A0720 Idiopathic chronic gout, left ankle and foot, without tophus (tophi): Secondary | ICD-10-CM

## 2015-07-27 LAB — BAYER DCA HB A1C WAIVED: HB A1C: 9 % — AB (ref <7.0)

## 2015-07-27 MED ORDER — AMLODIPINE BESYLATE 10 MG PO TABS: 10.0000 mg | ORAL_TABLET | Freq: Every day | ORAL | Status: DC

## 2015-07-27 MED ORDER — CARVEDILOL 25 MG PO TABS: 25.0000 mg | ORAL_TABLET | Freq: Two times a day (BID) | ORAL | Status: DC

## 2015-07-27 MED ORDER — ATORVASTATIN CALCIUM 20 MG PO TABS: 20.0000 mg | ORAL_TABLET | Freq: Every day | ORAL | Status: DC

## 2015-07-27 MED ORDER — ALLOPURINOL 300 MG PO TABS: 300.0000 mg | ORAL_TABLET | Freq: Every day | ORAL | Status: DC

## 2015-07-27 MED ORDER — HYDROCHLOROTHIAZIDE 25 MG PO TABS: 25.0000 mg | ORAL_TABLET | Freq: Every day | ORAL | Status: DC

## 2015-07-27 MED ORDER — SITAGLIPTIN PHOSPHATE 100 MG PO TABS: 100.0000 mg | ORAL_TABLET | Freq: Every day | ORAL | Status: DC

## 2015-07-27 MED ORDER — LOSARTAN POTASSIUM 100 MG PO TABS: 100.0000 mg | ORAL_TABLET | Freq: Every day | ORAL | Status: DC

## 2015-07-27 MED ORDER — ALBIGLUTIDE 50 MG ~~LOC~~ PEN: 50.0000 mg | PEN_INJECTOR | SUBCUTANEOUS | Status: DC

## 2015-07-27 NOTE — Progress Notes (Signed)
BP 109/64 mmHg  Pulse 64  Temp(Src) 98.5 F (36.9 C)  Ht 6' 2.2" (1.885 m)  Wt 317 lb (143.79 kg)  BMI 40.47 kg/m2  SpO2 99%   Subjective:    Patient ID: Aaron [name], male    DOB: 19-Mar-1951, 65 y.o.   MRN: [id number]  HPI: Aaron [name] is a 66 y.o. male  Chief Complaint  Patient presents with  . Diabetes   patient follow-up diabetes blood sugar has been all in all doing well taking 60 units of Levemir in 100 units of NovoLog 7030 blood sugar still being up doesn't seem to be coming down fasting blood sugars in the 180s hasn't gone pass 60 units of Levemir Other medications premature blood pressure cholesterol all seem to be doing well Concerned with knee replacement surgery done in [place] in November was told to have stage III renal failure. Chart review of labs since that time renal functions returned to normal  `  Relevant past medical, surgical, family and social history reviewed and updated as indicated. Interim medical history since our last visit reviewed. Allergies and medications reviewed and updated.  Review of Systems  Constitutional: Negative.   Respiratory: Negative.   Cardiovascular: Negative.     Per HPI unless specifically indicated above     Objective:    BP 109/64 mmHg  Pulse 64  Temp(Src) 98.5 F (36.9 C)  Ht 6' 2.2" (1.885 m)  Wt 317 lb (143.79 kg)  BMI 40.47 kg/m2  SpO2 99%  Wt Readings from Last 3 Encounters:  07/27/15 317 lb (143.79 kg)  04/26/15 326 lb (147.873 kg)  01/22/15 333 lb (151.048 kg)    Physical Exam  Constitutional: He is oriented to person, place, and time. He appears well-developed and well-nourished. No distress.  HENT:  Head: Normocephalic and atraumatic.  Right Ear: Hearing normal.  Left Ear: Hearing normal.  Nose: Nose normal.  Eyes: Conjunctivae and lids are normal. Right eye exhibits no discharge. Left eye exhibits no discharge. No scleral icterus.  Cardiovascular: Normal rate, regular rhythm and normal  heart sounds.   Pulmonary/Chest: Effort normal and breath sounds normal. No respiratory distress.  Musculoskeletal: Normal range of motion.  Neurological: He is alert and oriented to person, place, and time.  Skin: Skin is intact. No rash noted.  Psychiatric: He has a normal mood and affect. His speech is normal and behavior is normal. Judgment and thought content normal. Cognition and memory are normal.    Results for orders placed or performed in visit on 04/26/15  Microscopic Examination  Result Value Ref Range   WBC, UA None seen 0 -  5 /hpf   RBC, UA 0-2 0 -  2 /hpf   Epithelial Cells (non renal) 0-10 0 - 10 /hpf   Mucus, UA Present Not Estab.   Bacteria, UA Few None seen/Few  CBC with Differential/Platelet  Result Value Ref Range   WBC 8.0 3.4 - 10.8 x10E3/uL   RBC 4.27 4.14 - 5.80 x10E6/uL   Hemoglobin 11.4 (L) 12.6 - 17.7 g/dL   Hematocrit 35.7 (L) 37.5 - 51.0 %   MCV 84 79 - 97 fL   MCH 26.7 26.6 - 33.0 pg   MCHC 31.9 31.5 - 35.7 g/dL   RDW 16.4 (H) 12.3 - 15.4 %   Platelets 235 150 - 379 x10E3/uL   Neutrophils 73 %   Lymphs 14 %   Monocytes 9 %   Eos 4 %   Basos 0 %  Neutrophils Absolute 5.8 1.4 - 7.0 x10E3/uL   Lymphocytes Absolute 1.1 0.7 - 3.1 x10E3/uL   Monocytes Absolute 0.7 0.1 - 0.9 x10E3/uL   EOS (ABSOLUTE) 0.3 0.0 - 0.4 x10E3/uL   Basophils Absolute 0.0 0.0 - 0.2 x10E3/uL   Immature Granulocytes 0 %   Immature Grans (Abs) 0.0 0.0 - 0.1 x10E3/uL  Comprehensive metabolic panel  Result Value Ref Range   Glucose 53 (L) 65 - 99 mg/dL   BUN 16 8 - 27 mg/dL   Creatinine, Ser 1.09 0.76 - 1.27 mg/dL   GFR calc non Af Amer 72 >59 mL/min/1.73   GFR calc Af Amer 84 >59 mL/min/1.73   BUN/Creatinine Ratio 15 10 - 22   Sodium 143 136 - 144 mmol/L   Potassium 3.9 3.5 - 5.2 mmol/L   Chloride 104 97 - 106 mmol/L   CO2 25 18 - 29 mmol/L   Calcium 8.9 8.6 - 10.2 mg/dL   Total Protein 6.6 6.0 - 8.5 g/dL   Albumin 3.8 3.6 - 4.8 g/dL   Globulin, Total 2.8 1.5 - 4.5  g/dL   Albumin/Globulin Ratio 1.4 1.1 - 2.5   Bilirubin Total 0.3 0.0 - 1.2 mg/dL   Alkaline Phosphatase 113 39 - 117 IU/L   AST 14 0 - 40 IU/L   ALT 21 0 - 44 IU/L  Lipid Panel w/o Chol/HDL Ratio  Result Value Ref Range   Cholesterol, Total 140 100 - 199 mg/dL   Triglycerides 160 (H) 0 - 149 mg/dL   HDL 30 (L) >39 mg/dL   VLDL Cholesterol Cal 32 5 - 40 mg/dL   LDL Calculated 78 0 - 99 mg/dL  PSA  Result Value Ref Range   Prostate Specific Ag, Serum <0.1 0.0 - 4.0 ng/mL  TSH  Result Value Ref Range   TSH 3.120 0.450 - 4.500 uIU/mL  Urinalysis, Routine w reflex microscopic (not at [facility])  Result Value Ref Range   Specific Gravity, UA 1.020 1.005 - 1.030   pH, UA 5.5 5.0 - 7.5   Color, UA Yellow Yellow   Appearance Ur Clear Clear   Leukocytes, UA Negative Negative   Protein, UA Negative Negative/Trace   Glucose, UA 2+ (A) Negative   Ketones, UA Negative Negative   RBC, UA Trace (A) Negative   Bilirubin, UA Negative Negative   Urobilinogen, Ur 0.2 0.2 - 1.0 mg/dL   Nitrite, UA Negative Negative   Microscopic Examination See below:   HIV antibody  Result Value Ref Range   HIV Screen 4th Generation wRfx Non Reactive Non Reactive  Hepatitis C Antibody  Result Value Ref Range   Hep C Virus Ab <0.1 0.0 - 0.9 s/co ratio  Bayer DCA Hb A1c Waived  Result Value Ref Range   Bayer DCA Hb A1c Waived 6.5 <7.0 %      Assessment & Plan:   Problem List Items Addressed This Visit      Cardiovascular and Mediastinum   Hypertension    The current medical regimen is effective;  continue present plan and medications.       Relevant Medications   amLODipine (NORVASC) 10 MG tablet   atorvastatin (LIPITOR) 20 MG tablet   carvedilol (COREG) 25 MG tablet   hydrochlorothiazide (HYDRODIURIL) 25 MG tablet   losartan (COZAAR) 100 MG tablet     Endocrine   Diabetes mellitus without complication (HCC) - Primary    Review of chart patient with poor control of diabetes with intermittent  spells of good control  patient on high-dose insulin will refer to endocrinology to further optimize patient's care of diabetes For now will continue to titrate Levemir up Continued daytime insulins Discussed diet weight loss is patient's doing a good job will continue      Relevant Medications   Albiglutide (TANZEUM) 50 MG PEN   atorvastatin (LIPITOR) 20 MG tablet   losartan (COZAAR) 100 MG tablet   sitaGLIPtin (JANUVIA) 100 MG tablet   Other Relevant Orders   Bayer DCA Hb A1c Waived   Ambulatory referral to Endocrinology     Other   Hyperlipidemia    The current medical regimen is effective;  continue present plan and medications.       Relevant Medications   amLODipine (NORVASC) 10 MG tablet   atorvastatin (LIPITOR) 20 MG tablet   carvedilol (COREG) 25 MG tablet   hydrochlorothiazide (HYDRODIURIL) 25 MG tablet   losartan (COZAAR) 100 MG tablet   Gout    The current medical regimen is effective;  continue present plan and medications.        Other Visit Diagnoses    Essential hypertension, benign        Relevant Medications    amLODipine (NORVASC) 10 MG tablet    atorvastatin (LIPITOR) 20 MG tablet    carvedilol (COREG) 25 MG tablet    hydrochlorothiazide (HYDRODIURIL) 25 MG tablet    losartan (COZAAR) 100 MG tablet    Hyperlipemia        Relevant Medications    amLODipine (NORVASC) 10 MG tablet    atorvastatin (LIPITOR) 20 MG tablet    carvedilol (COREG) 25 MG tablet    hydrochlorothiazide (HYDRODIURIL) 25 MG tablet    losartan (COZAAR) 100 MG tablet        Follow up plan: Return in about 3 months (around 10/27/2015) for BMP, lipids, ALT, AST no A1c is patient will be followed at endocrinology.

## 2015-07-27 NOTE — Assessment & Plan Note (Signed)
The current medical regimen is effective;  continue present plan and medications.  

## 2015-07-27 NOTE — Telephone Encounter (Signed)
Call in rxs plz   :(

## 2015-07-27 NOTE — Telephone Encounter (Signed)
Pt called stated Dr. Jeananne Rama called in some medications for him this morning but not the ones the pt needs. Pt needs the following:  Syringes Needles Test strips for One Touch Ultra Meter Lancets  Pharm is [facility] on [address]. Thanks.

## 2015-07-27 NOTE — Assessment & Plan Note (Signed)
Review of chart patient with poor control of diabetes with intermittent spells of good control patient on high-dose insulin will refer to endocrinology to further optimize patient's care of diabetes For now will continue to titrate Levemir up Continued daytime insulins Discussed diet weight loss is patient's doing a good job will continue

## 2015-08-16 ENCOUNTER — Encounter: Payer: Self-pay | Admitting: Family Medicine

## 2015-09-10 ENCOUNTER — Other Ambulatory Visit: Payer: Self-pay | Admitting: Family Medicine

## 2015-09-11 ENCOUNTER — Other Ambulatory Visit: Payer: Self-pay | Admitting: Family Medicine

## 2015-11-09 ENCOUNTER — Encounter: Payer: Self-pay | Admitting: Family Medicine

## 2015-11-09 ENCOUNTER — Ambulatory Visit (INDEPENDENT_AMBULATORY_CARE_PROVIDER_SITE_OTHER): Payer: BLUE CROSS/BLUE SHIELD | Admitting: Family Medicine

## 2015-11-09 VITALS — BP 126/73 | HR 65 | Temp 98.1°F | Ht 74.2 in | Wt 323.0 lb

## 2015-11-09 DIAGNOSIS — E785 Hyperlipidemia, unspecified: Secondary | ICD-10-CM | POA: Diagnosis not present

## 2015-11-09 DIAGNOSIS — M1A0720 Idiopathic chronic gout, left ankle and foot, without tophus (tophi): Secondary | ICD-10-CM | POA: Diagnosis not present

## 2015-11-09 DIAGNOSIS — I10 Essential (primary) hypertension: Secondary | ICD-10-CM | POA: Diagnosis not present

## 2015-11-09 DIAGNOSIS — E119 Type 2 diabetes mellitus without complications: Secondary | ICD-10-CM | POA: Diagnosis not present

## 2015-11-09 LAB — LP+ALT+AST PICCOLO, WAIVED
ALT (SGPT) Piccolo, Waived: 14 U/L (ref 10–47)
AST (SGOT) PICCOLO, WAIVED: 23 U/L (ref 11–38)
CHOL/HDL RATIO PICCOLO,WAIVE: 3.3 mg/dL
Cholesterol Piccolo, Waived: 90 mg/dL (ref <200)
HDL Chol Piccolo, Waived: 27 mg/dL — ABNORMAL LOW (ref >59)
LDL CHOL CALC PICCOLO WAIVED: 40 mg/dL (ref <100)
TRIGLYCERIDES PICCOLO,WAIVED: 113 mg/dL (ref <150)
VLDL CHOL CALC PICCOLO,WAIVE: 23 mg/dL (ref <30)

## 2015-11-09 NOTE — Progress Notes (Signed)
   BP 126/73 mmHg  Pulse 65  Temp(Src) 98.1 F (36.7 C)  Ht 6' 2.2" (1.885 m)  Wt 323 lb (146.512 kg)  BMI 41.23 kg/m2  SpO2 97%   Subjective:    Patient ID: Aaron [name], male    DOB: 02/22/1951, 65 y.o.   MRN: [id number]  HPI: Aaron [name] is a 65 y.o. male  Chief Complaint  Patient presents with  . Hypertension  . Hyperlipidemia   Patient presents for routine follow up today.   HTN - Blood pressures have been WNL, no dizzy spells or syncopal episodes. Hyperlipidemia - Doing well on lipitor, no side effects noted.  DM - Following with endocrinology. On insulin pump now and doing well.  Gout - No issues recently, no concerns with the allopurinol Sleep apnea - Sleeping well with CPAP  Continuing to work on lifestyle modification, very motivated to lose weight. Counting carbs very closely. Compliant with medications.   Relevant past medical, surgical, family and social history reviewed and updated as indicated. Interim medical history since our last visit reviewed. Allergies and medications reviewed and updated.  Review of Systems  Per HPI unless specifically indicated above     Objective:    BP 126/73 mmHg  Pulse 65  Temp(Src) 98.1 F (36.7 C)  Ht 6' 2.2" (1.885 m)  Wt 323 lb (146.512 kg)  BMI 41.23 kg/m2  SpO2 97%  Wt Readings from Last 3 Encounters:  11/09/15 323 lb (146.512 kg)  07/27/15 317 lb (143.79 kg)  04/26/15 326 lb (147.873 kg)    Physical Exam  Results for orders placed or performed in visit on 07/27/15  Bayer DCA Hb A1c Waived  Result Value Ref Range   Bayer DCA Hb A1c Waived 9.0 (H) <7.0 %      Assessment & Plan:   Problem List Items Addressed This Visit      Cardiovascular and Mediastinum   Hypertension    The current medical regimen is effective;  continue present plan and medications.       Relevant Orders   Basic metabolic panel   LP+ALT+AST [place], [place]     Endocrine   Diabetes mellitus without complication  ([place])    Followed by endocrinology. Recently switched to insulin pump and BS much improved.       Relevant Medications   HUMALOG 100 UNIT/ML injection     Other   Hyperlipidemia - Primary    The current medical regimen is effective;  continue present plan and medications.       Relevant Orders   Basic metabolic panel   LP+ALT+AST [place], Waived   Gout    The current medical regimen is effective;  continue present plan and medications.           Follow up plan: Return in about 6 months (around 05/10/2016) for Physical Exam.

## 2015-11-09 NOTE — Assessment & Plan Note (Signed)
Followed by endocrinology. Recently switched to insulin pump and BS much improved.

## 2015-11-09 NOTE — Assessment & Plan Note (Signed)
The current medical regimen is effective;  continue present plan and medications.  

## 2015-11-10 ENCOUNTER — Encounter: Payer: Self-pay | Admitting: Family Medicine

## 2015-11-10 LAB — BASIC METABOLIC PANEL
BUN / CREAT RATIO: 16 (ref 10–24)
BUN: 17 mg/dL (ref 8–27)
CO2: 26 mmol/L (ref 18–29)
CREATININE: 1.08 mg/dL (ref 0.76–1.27)
Calcium: 9.1 mg/dL (ref 8.6–10.2)
Chloride: 100 mmol/L (ref 96–106)
GFR calc non Af Amer: 73 mL/min/{1.73_m2} (ref >59)
GFR, EST AFRICAN AMERICAN: 84 mL/min/{1.73_m2} (ref >59)
Glucose: 103 mg/dL — ABNORMAL HIGH (ref 65–99)
Potassium: 4.2 mmol/L (ref 3.5–5.2)
SODIUM: 142 mmol/L (ref 134–144)

## 2015-11-15 ENCOUNTER — Ambulatory Visit: Payer: BLUE CROSS/BLUE SHIELD | Admitting: Family Medicine

## 2016-04-26 ENCOUNTER — Encounter: Payer: BLUE CROSS/BLUE SHIELD | Admitting: Family Medicine

## 2016-05-01 ENCOUNTER — Encounter: Payer: BLUE CROSS/BLUE SHIELD | Admitting: Family Medicine

## 2016-05-29 ENCOUNTER — Encounter: Payer: BLUE CROSS/BLUE SHIELD | Admitting: Family Medicine

## 2016-06-11 ENCOUNTER — Emergency Department: Payer: BLUE CROSS/BLUE SHIELD

## 2016-06-11 ENCOUNTER — Encounter: Payer: Self-pay | Admitting: Emergency Medicine

## 2016-06-11 ENCOUNTER — Emergency Department
Admission: EM | Admit: 2016-06-11 | Discharge: 2016-06-11 | Disposition: A | Discharge status: Home / Self Care | Payer: BLUE CROSS/BLUE SHIELD | Attending: Student in an Organized Health Care Education/Training Program | Admitting: Student in an Organized Health Care Education/Training Program

## 2016-06-11 DIAGNOSIS — I10 Essential (primary) hypertension: Secondary | ICD-10-CM | POA: Insufficient documentation

## 2016-06-11 DIAGNOSIS — Z79899 Other long term (current) drug therapy: Secondary | ICD-10-CM | POA: Insufficient documentation

## 2016-06-11 DIAGNOSIS — M7522 Bicipital tendinitis, left shoulder: Secondary | ICD-10-CM | POA: Insufficient documentation

## 2016-06-11 DIAGNOSIS — R42 Dizziness and giddiness: Secondary | ICD-10-CM | POA: Diagnosis not present

## 2016-06-11 DIAGNOSIS — E119 Type 2 diabetes mellitus without complications: Secondary | ICD-10-CM | POA: Diagnosis not present

## 2016-06-11 DIAGNOSIS — R55 Syncope and collapse: Secondary | ICD-10-CM | POA: Insufficient documentation

## 2016-06-11 DIAGNOSIS — R519 Headache, unspecified: Secondary | ICD-10-CM

## 2016-06-11 DIAGNOSIS — M25512 Pain in left shoulder: Secondary | ICD-10-CM | POA: Diagnosis present

## 2016-06-11 DIAGNOSIS — R51 Headache: Secondary | ICD-10-CM | POA: Diagnosis not present

## 2016-06-11 LAB — URINALYSIS, COMPLETE (UACMP) WITH MICROSCOPIC
BACTERIA UA: NONE SEEN
BILIRUBIN URINE: NEGATIVE
Glucose, UA: NEGATIVE mg/dL
KETONES UR: NEGATIVE mg/dL
LEUKOCYTES UA: NEGATIVE
Nitrite: NEGATIVE
PH: 5 (ref 5.0–8.0)
Protein, ur: 30 mg/dL — AB
Specific Gravity, Urine: 1.027 (ref 1.005–1.030)

## 2016-06-11 LAB — CBC
HEMATOCRIT: 39.2 % — AB (ref 40.0–52.0)
Hemoglobin: 13.4 g/dL (ref 13.0–18.0)
MCH: 27.3 pg (ref 26.0–34.0)
MCHC: 34.2 g/dL (ref 32.0–36.0)
MCV: 80 fL (ref 80.0–100.0)
PLATELETS: 252 10*3/uL (ref 150–440)
RBC: 4.89 MIL/uL (ref 4.40–5.90)
RDW: 16.2 % — ABNORMAL HIGH (ref 11.5–14.5)
WBC: 10.1 10*3/uL (ref 3.8–10.6)

## 2016-06-11 LAB — BASIC METABOLIC PANEL
Anion gap: 7 (ref 5–15)
BUN: 20 mg/dL (ref 6–20)
CHLORIDE: 102 mmol/L (ref 101–111)
CO2: 29 mmol/L (ref 22–32)
CREATININE: 1.19 mg/dL (ref 0.61–1.24)
Calcium: 8.8 mg/dL — ABNORMAL LOW (ref 8.9–10.3)
GFR calc Af Amer: >60 mL/min (ref >60)
GFR calc non Af Amer: >60 mL/min (ref >60)
Glucose, Bld: 74 mg/dL (ref 65–99)
POTASSIUM: 3.7 mmol/L (ref 3.5–5.1)
Sodium: 138 mmol/L (ref 135–145)

## 2016-06-11 MED ORDER — NAPROXEN 375 MG PO TABS: 375.0000 mg | ORAL_TABLET | Freq: Two times a day (BID) | ORAL | 0 refills | Status: AC

## 2016-06-11 MED ORDER — CYCLOBENZAPRINE HCL 10 MG PO TABS: 10.0000 mg | ORAL_TABLET | Freq: Three times a day (TID) | ORAL | 0 refills | Status: DC | PRN

## 2016-06-11 NOTE — ED Triage Notes (Addendum)
Pt to ED after having a syncopal episode yesterday morning around 7:30 am. Pt states he has not had similar episodes before. Pt denies recent illness. Pt states he has a headache and left arm pain.

## 2016-06-11 NOTE — Discharge Instructions (Signed)

## 2016-06-11 NOTE — ED Provider Notes (Signed)
[facility] Emergency Department Provider Note    First MD Initiated Contact with Patient 06/11/16 1200     (approximate)  I have reviewed the triage vital signs and the nursing notes.   HISTORY  Chief Complaint Loss of Consciousness    HPI Aaron [name] is a 66 y.o. male history of diabetes and hypertension presents with concern for left shoulder pain and left face pain after having a syncopal episode yesterday morning around 7:30 AM after getting out of the shower. Since he was otherwise feeling well. Got out of the shower and began feeling dizzy. He then fell to the ground landing on his left shoulder and left face. There is no prolonged LOC. Patient states that he did not fully lose consciousness. States that he just felt dizzy and lost balance. Denies any chest pain. No headache. No otherwise had a normal day yesterday. He presents to the ER today at the encouragement of his family. He is not on any blood thinners.   Past Medical History:  Diagnosis Date  . Diabetes mellitus without complication ([name])   . Gout   . Hyperlipidemia   . Hypertension   . Sleep apnea    Family History  Problem Relation Age of Onset  . Cancer Mother   . Stroke Father   . Cancer Sister   . Cancer Maternal Uncle   . Stroke Maternal Uncle    Past Surgical History:  Procedure Laterality Date  . CIRCUMCISION    . deviated septrum    . JOINT REPLACEMENT Left    Hip  . PROSTATE SURGERY    . TONSILLECTOMY AND ADENOIDECTOMY     Patient Active Problem List   Diagnosis Date Noted  . Sleep apnea 04/26/2015  . Gout 04/26/2015  . Prostate cancer ([name]) 04/26/2015  . 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 BMI 40.0-44.9, adult ([name]) 04/26/2015  . Diabetes mellitus without complication ([name])   . Hyperlipidemia   . Hypertension       Prior to Admission medications   Medication Sig Start Date End Date Taking? Authorizing Provider  allopurinol (ZYLOPRIM) 300 MG tablet  Take 1 tablet (300 mg total) by mouth daily. 07/27/15  Yes [name], MD  amLODipine (NORVASC) 10 MG tablet Take 1 tablet (10 mg total) by mouth daily. 07/27/15  Yes [name], MD  atorvastatin (LIPITOR) 20 MG tablet Take 1 tablet (20 mg total) by mouth daily. 07/27/15  Yes [name], MD  BD INSULIN SYRINGE ULTRAFINE 31G X 15/64" 1 ML MISC USE AS DIRECTED 07/26/15  Yes [name], MD  carvedilol (COREG) 25 MG tablet Take 1 tablet (25 mg total) by mouth 2 (two) times daily with a meal. 07/27/15  Yes [name], MD  Dulaglutide 1.5 MG/0.5ML SOPN Inject 1.5 mg into the skin every 7 (seven) days. 03/02/16  Yes Historical Provider, MD  HUMALOG 100 UNIT/ML injection USE UP TO 300 UNITS D VIA INSULIN PUMP 10/28/15  Yes Historical Provider, MD  hydrochlorothiazide (HYDRODIURIL) 25 MG tablet Take 1 tablet (25 mg total) by mouth daily. 07/27/15  Yes [name], MD  losartan (COZAAR) 100 MG tablet Take 1 tablet (100 mg total) by mouth daily. 07/27/15  Yes [name], MD  Albiglutide (TANZEUM) 50 MG PEN Inject 50 mg into the skin once a week. Patient not taking: Reported on 06/11/2016 07/27/15   [name], MD  cyclobenzaprine (FLEXERIL) 10 MG tablet Take 1 tablet (10  mg total) by mouth 3 (three) times daily as needed for muscle spasms. 06/11/16   Merlyn Lot, MD  naproxen (NAPROSYN) 375 MG tablet Take 1 tablet (375 mg total) by mouth 2 (two) times daily with a meal. 06/11/16 06/21/16  Merlyn Lot, MD    Allergies Glucophage [metformin]    Social History Social History  Substance Use Topics  . Smoking status: Never Smoker  . Smokeless tobacco: Never Used  . Alcohol use No    Review of Systems Patient denies headaches, rhinorrhea, blurry vision, numbness, shortness of breath, chest pain, edema, cough, abdominal pain, nausea, vomiting, diarrhea, dysuria, fevers, rashes or hallucinations unless otherwise stated above in  HPI. ____________________________________________   PHYSICAL EXAM:  VITAL SIGNS: Vitals:   06/11/16 0952 06/11/16 1438  BP: 117/68 115/65  Pulse: 92 77  Resp: 18   Temp: 98.2 F (36.8 C)     Constitutional: Alert and oriented. Well appearing and in no acute distress. Eyes: Conjunctivae are normal. PERRL. EOMI. Head: Atraumatic. Nose: No congestion/rhinnorhea. Mouth/Throat: Mucous membranes are moist.  Oropharynx non-erythematous. Neck: No stridor. Painless ROM. No cervical spine tenderness to palpation Hematological/Lymphatic/Immunilogical: No cervical lymphadenopathy. Cardiovascular: Normal rate, regular rhythm. Grossly normal heart sounds.  Good peripheral circulation. Respiratory: Normal respiratory effort.  No retractions. Lungs CTAB. Gastrointestinal: Soft and nontender. No distention. No abdominal bruits. No CVA tenderness. Genitourinary:  Musculoskeletal: No lower extremity tenderness nor edema.  No joint effusions.  ttp to anterior humeral head of  Left shoulder with pain reproduced with anterior abduction.  No deformity or effusion Neurologic:  CN- intact.  No facial droop, Normal FNF.  Normal heel to shin.  Sensation intact bilaterally. Normal speech and language. No gross focal neurologic deficits are appreciated. No gait instability.  Skin:  Skin is warm, dry and intact. No rash noted. Psychiatric: Mood and affect are normal. Speech and behavior are normal.  ____________________________________________   LABS (all labs ordered are listed, but only abnormal results are displayed)  Results for orders placed or performed during the hospital encounter of 06/11/16 (from the past 24 hour(s))  Basic metabolic panel     Status: Abnormal   Collection Time: 06/11/16 10:08 AM  Result Value Ref Range   Sodium 138 135 - 145 mmol/L   Potassium 3.7 3.5 - 5.1 mmol/L   Chloride 102 101 - 111 mmol/L   CO2 29 22 - 32 mmol/L   Glucose, Bld 74 65 - 99 mg/dL   BUN 20 6 - 20 mg/dL    Creatinine, Ser 1.19 0.61 - 1.24 mg/dL   Calcium 8.8 (L) 8.9 - 10.3 mg/dL   GFR calc non Af Amer >60 >60 mL/min   GFR calc Af Amer >60 >60 mL/min   Anion gap 7 5 - 15  CBC     Status: Abnormal   Collection Time: 06/11/16 10:08 AM  Result Value Ref Range   WBC 10.1 3.8 - 10.6 K/uL   RBC 4.89 4.40 - 5.90 MIL/uL   Hemoglobin 13.4 13.0 - 18.0 g/dL   HCT 39.2 (L) 40.0 - 52.0 %   MCV 80.0 80.0 - 100.0 fL   MCH 27.3 26.0 - 34.0 pg   MCHC 34.2 32.0 - 36.0 g/dL   RDW 16.2 (H) 11.5 - 14.5 %   Platelets 252 150 - 440 K/uL  Urinalysis, Complete w Microscopic     Status: Abnormal   Collection Time: 06/11/16 12:48 PM  Result Value Ref Range   Color, Urine AMBER (A) YELLOW  APPearance CLEAR (A) CLEAR   Specific Gravity, Urine 1.027 1.005 - 1.030   pH 5.0 5.0 - 8.0   Glucose, UA NEGATIVE NEGATIVE mg/dL   Hgb urine dipstick SMALL (A) NEGATIVE   Bilirubin Urine NEGATIVE NEGATIVE   Ketones, ur NEGATIVE NEGATIVE mg/dL   Protein, ur 30 (A) NEGATIVE mg/dL   Nitrite NEGATIVE NEGATIVE   Leukocytes, UA NEGATIVE NEGATIVE   RBC / HPF 0-5 0 - 5 RBC/hpf   WBC, UA 0-5 0 - 5 WBC/hpf   Bacteria, UA NONE SEEN NONE SEEN   Squamous Epithelial / LPF 0-5 (A) NONE SEEN   Mucous PRESENT    ____________________________________________  EKG My review and personal interpretation at Time: 9:53   Indication: syncope  Rate: 85  Rhythm: sinus Axis: normal Other: normal intervals, no stemi ____________________________________________  RADIOLOGY  I personally reviewed all radiographic images ordered to evaluate for the above acute complaints and reviewed radiology reports and findings.  These findings were personally discussed with the patient.  Please see medical record for radiology report. ____________________________________________   PROCEDURES  Procedure(s) performed:  Procedures    Critical Care performed: no ____________________________________________   INITIAL IMPRESSION / ASSESSMENT AND  PLAN / ED COURSE  Pertinent labs & imaging results that were available during my care of the patient were reviewed by me and considered in my medical decision making (see chart for details).  DDX: dehydation, hypoglycemia, dysrythmia, fracture, sdh, iph  ARISTIDIS ADLER is a 66 y.o. who presents to the ED with with facial pain and left shoulder pain after nursing, Ven yesterday and fall. Patient is afebrile hemodynamically stable. No focal neurodeficits. EKG is without any dysrhythmia or acute ischemia. Blood work is reassuring. Lane film of left shoulder shows no evidence of dislocation. I do suspect his injury is consistent with acute biceps tendinitis. CT imaging of Max face and head ordered due to concern for acute trauma status post fall with left facial pain. CT imaging without acute intracranial abnormality or evidence of fracture. Patient able to tolerate oral hydration. No lightheadedness or dizziness today. Patient able to ambulate with a steady gait.  Have discussed with the patient and available family all diagnostics and treatments performed thus far and all questions were answered to the best of my ability. The patient demonstrates understanding and agreement with plan.    ____________________________________________   FINAL CLINICAL IMPRESSION(S) / ED DIAGNOSES  Final diagnoses:  Dizziness  Facial pain, acute  Biceps tendonitis on left      NEW MEDICATIONS STARTED DURING THIS VISIT:  Discharge Medication List as of 06/11/2016  2:10 PM    START taking these medications   Details  cyclobenzaprine (FLEXERIL) 10 MG tablet Take 1 tablet (10 mg total) by mouth 3 (three) times daily as needed for muscle spasms., Starting Sun 06/11/2016, Print    naproxen (NAPROSYN) 375 MG tablet Take 1 tablet (375 mg total) by mouth 2 (two) times daily with a meal., Starting Sun 06/11/2016, Until Wed 06/21/2016, Print         Note:  This document was prepared using Dragon voice recognition  software and may include unintentional dictation errors.    Merlyn Lot, MD 06/11/16 1710

## 2016-07-11 ENCOUNTER — Encounter: Payer: Self-pay | Admitting: Family Medicine

## 2016-07-11 ENCOUNTER — Ambulatory Visit (INDEPENDENT_AMBULATORY_CARE_PROVIDER_SITE_OTHER): Payer: BLUE CROSS/BLUE SHIELD | Admitting: Family Medicine

## 2016-07-11 VITALS — BP 126/68 | HR 71 | Ht 74.25 in | Wt 303.0 lb

## 2016-07-11 DIAGNOSIS — Z6841 Body Mass Index (BMI) 40.0 and over, adult: Secondary | ICD-10-CM

## 2016-07-11 DIAGNOSIS — E7800 Pure hypercholesterolemia, unspecified: Secondary | ICD-10-CM

## 2016-07-11 DIAGNOSIS — E119 Type 2 diabetes mellitus without complications: Secondary | ICD-10-CM

## 2016-07-11 DIAGNOSIS — Z0000 Encounter for general adult medical examination without abnormal findings: Secondary | ICD-10-CM

## 2016-07-11 DIAGNOSIS — Z23 Encounter for immunization: Secondary | ICD-10-CM | POA: Diagnosis not present

## 2016-07-11 DIAGNOSIS — C61 Malignant neoplasm of prostate: Secondary | ICD-10-CM

## 2016-07-11 DIAGNOSIS — G4730 Sleep apnea, unspecified: Secondary | ICD-10-CM

## 2016-07-11 DIAGNOSIS — M1A0720 Idiopathic chronic gout, left ankle and foot, without tophus (tophi): Secondary | ICD-10-CM

## 2016-07-11 DIAGNOSIS — Z96641 Presence of right artificial hip joint: Secondary | ICD-10-CM

## 2016-07-11 DIAGNOSIS — Z2911 Encounter for prophylactic immunotherapy for respiratory syncytial virus (RSV): Secondary | ICD-10-CM

## 2016-07-11 DIAGNOSIS — Z1329 Encounter for screening for other suspected endocrine disorder: Secondary | ICD-10-CM

## 2016-07-11 DIAGNOSIS — E785 Hyperlipidemia, unspecified: Secondary | ICD-10-CM

## 2016-07-11 DIAGNOSIS — I10 Essential (primary) hypertension: Secondary | ICD-10-CM

## 2016-07-11 LAB — URINALYSIS, ROUTINE W REFLEX MICROSCOPIC
Bilirubin, UA: NEGATIVE
GLUCOSE, UA: NEGATIVE
KETONES UA: NEGATIVE
LEUKOCYTES UA: NEGATIVE
Nitrite, UA: NEGATIVE
Protein, UA: NEGATIVE
Specific Gravity, UA: 1.02 (ref 1.005–1.030)
Urobilinogen, Ur: 1 mg/dL (ref 0.2–1.0)
pH, UA: 6 (ref 5.0–7.5)

## 2016-07-11 MED ORDER — LOSARTAN POTASSIUM 100 MG PO TABS: 100.0000 mg | ORAL_TABLET | Freq: Every day | ORAL | 4 refills | Status: DC

## 2016-07-11 MED ORDER — CARVEDILOL 25 MG PO TABS: 25.0000 mg | ORAL_TABLET | Freq: Two times a day (BID) | ORAL | 4 refills | Status: DC

## 2016-07-11 MED ORDER — ALLOPURINOL 300 MG PO TABS: 300.0000 mg | ORAL_TABLET | Freq: Every day | ORAL | 4 refills | Status: DC

## 2016-07-11 MED ORDER — ATORVASTATIN CALCIUM 20 MG PO TABS: 20.0000 mg | ORAL_TABLET | Freq: Every day | ORAL | 4 refills | Status: DC

## 2016-07-11 MED ORDER — AMLODIPINE BESYLATE 10 MG PO TABS: 10.0000 mg | ORAL_TABLET | Freq: Every day | ORAL | 4 refills | Status: DC

## 2016-07-11 MED ORDER — HYDROCHLOROTHIAZIDE 25 MG PO TABS: 25.0000 mg | ORAL_TABLET | Freq: Every day | ORAL | 4 refills | Status: DC

## 2016-07-11 NOTE — Assessment & Plan Note (Signed)
Doing well no signs or symptoms

## 2016-07-11 NOTE — Assessment & Plan Note (Signed)
The current medical regimen is effective;  continue present plan and medications.  

## 2016-07-11 NOTE — Assessment & Plan Note (Addendum)
The current medical regimen is effective;  continue present plan and medications.  

## 2016-07-11 NOTE — Progress Notes (Addendum)
BP 126/68 (BP Location: Left Arm)   Pulse 71   Ht 6' 2.25" (1.886 m)   Wt (!) 303 lb (137.4 kg)   SpO2 98%   BMI 38.64 kg/m    Subjective:    Patient ID: Aaron [name], male    DOB: Apr 02, 1951, 66 y.o.   MRN: [id number]  HPI: Aaron [name] is a 66 y.o. male  Chief Complaint  Patient presents with  . Annual Exam  Patient also follow-up blood pressure doing well taking medications without problems or concerns, blood pressure monitoring indicating good control as is here. Also rechecking diabetes doing well no complaints from medications has had no lows or 2 but otherwise doing well with no significant problems. No complaints with gout or signs or symptoms taking medications without problems. Cholesterol doing well no complaints taking medications without problems or issues.  Relevant past medical, surgical, family and social history reviewed and updated as indicated. Interim medical history since our last visit reviewed. Allergies and medications reviewed and updated.  Review of Systems  Constitutional: Negative.   HENT: Negative.   Eyes: Negative.   Respiratory: Negative.   Cardiovascular: Negative.   Gastrointestinal: Negative.   Endocrine: Negative.   Genitourinary: Negative.   Musculoskeletal: Negative.   Skin: Negative.   Allergic/Immunologic: Negative.   Neurological: Negative.   Hematological: Negative.   Psychiatric/Behavioral: Negative.     Per HPI unless specifically indicated above     Objective:    BP 126/68 (BP Location: Left Arm)   Pulse 71   Ht 6' 2.25" (1.886 m)   Wt (!) 303 lb (137.4 kg)   SpO2 98%   BMI 38.64 kg/m   Wt Readings from Last 3 Encounters:  07/11/16 (!) 303 lb (137.4 kg)  06/11/16 300 lb (136.1 kg)  11/09/15 (!) 323 lb (146.5 kg)    Physical Exam  Constitutional: He is oriented to person, place, and time. He appears well-developed and well-nourished.  HENT:  Head: Normocephalic and atraumatic.  Right Ear: External ear  normal.  Left Ear: External ear normal.  Eyes: Conjunctivae and EOM are normal. Pupils are equal, round, and reactive to light.  Neck: Normal range of motion. Neck supple.  Cardiovascular: Normal rate, regular rhythm, normal heart sounds and intact distal pulses.   Pulmonary/Chest: Effort normal and breath sounds normal.  Abdominal: Soft. Bowel sounds are normal. There is no splenomegaly or hepatomegaly.  Genitourinary: Rectum normal and penis normal.  Genitourinary Comments: Prostate removed nl exam  Musculoskeletal: Normal range of motion.  Neurological: He is alert and oriented to person, place, and time. He has normal reflexes.  Skin: No rash noted. No erythema.  Psychiatric: He has a normal mood and affect. His behavior is normal. Judgment and thought content normal.    Results for orders placed or performed during the hospital encounter of [id number]  Basic metabolic panel  Result Value Ref Range   Sodium 138 135 - 145 mmol/L   Potassium 3.7 3.5 - 5.1 mmol/L   Chloride 102 101 - 111 mmol/L   CO2 29 22 - 32 mmol/L   Glucose, Bld 74 65 - 99 mg/dL   BUN 20 6 - 20 mg/dL   Creatinine, Ser 1.19 0.61 - 1.24 mg/dL   Calcium 8.8 (L) 8.9 - 10.3 mg/dL   GFR calc non Af Amer >60 >60 mL/min   GFR calc Af Amer >60 >60 mL/min   Anion gap 7 5 - 15  CBC  Result Value Ref Range  WBC 10.1 3.8 - 10.6 K/uL   RBC 4.89 4.40 - 5.90 MIL/uL   Hemoglobin 13.4 13.0 - 18.0 g/dL   HCT 39.2 (L) 40.0 - 52.0 %   MCV 80.0 80.0 - 100.0 fL   MCH 27.3 26.0 - 34.0 pg   MCHC 34.2 32.0 - 36.0 g/dL   RDW 16.2 (H) 11.5 - 14.5 %   Platelets 252 150 - 440 K/uL  Urinalysis, Complete w Microscopic  Result Value Ref Range   Color, Urine AMBER (A) YELLOW   APPearance CLEAR (A) CLEAR   Specific Gravity, Urine 1.027 1.005 - 1.030   pH 5.0 5.0 - 8.0   Glucose, UA NEGATIVE NEGATIVE mg/dL   Hgb urine dipstick SMALL (A) NEGATIVE   Bilirubin Urine NEGATIVE NEGATIVE   Ketones, ur NEGATIVE NEGATIVE mg/dL   Protein, ur  30 (A) NEGATIVE mg/dL   Nitrite NEGATIVE NEGATIVE   Leukocytes, UA NEGATIVE NEGATIVE   RBC / HPF 0-5 0 - 5 RBC/hpf   WBC, UA 0-5 0 - 5 WBC/hpf   Bacteria, UA NONE SEEN NONE SEEN   Squamous Epithelial / LPF 0-5 (A) NONE SEEN   Mucous PRESENT       Assessment & Plan:   Problem List Items Addressed This Visit      Cardiovascular and Mediastinum   Hypertension    The current medical regimen is effective;  continue present plan and medications.       Relevant Medications   amLODipine (NORVASC) 10 MG tablet   atorvastatin (LIPITOR) 20 MG tablet   carvedilol (COREG) 25 MG tablet   hydrochlorothiazide (HYDRODIURIL) 25 MG tablet   losartan (COZAAR) 100 MG tablet   Other Relevant Orders   CBC with Differential/Platelet   Comprehensive metabolic panel   Urinalysis, Routine w reflex microscopic     Respiratory   Sleep apnea    Using CPAP faithfully without problems.        Endocrine   Diabetes mellitus without complication ([place])    The current medical regimen is effective;  continue present plan and medications. Followed by endocrinology insulin pump doing well.       Relevant Medications   atorvastatin (LIPITOR) 20 MG tablet   losartan (COZAAR) 100 MG tablet   Other Relevant Orders   CBC with Differential/Platelet   Comprehensive metabolic panel     Genitourinary   Prostate cancer ([place])    Doing well no signs or symptoms      Relevant Medications   allopurinol (ZYLOPRIM) 300 MG tablet   Other Relevant Orders   PSA     Other   Hyperlipidemia    The current medical regimen is effective;  continue present plan and medications.       Relevant Medications   amLODipine (NORVASC) 10 MG tablet   atorvastatin (LIPITOR) 20 MG tablet   carvedilol (COREG) 25 MG tablet   hydrochlorothiazide (HYDRODIURIL) 25 MG tablet   losartan (COZAAR) 100 MG tablet   Other Relevant Orders   CBC with Differential/Platelet   Comprehensive metabolic panel   Lipid panel    Urinalysis, Routine w reflex microscopic   Gout    The current medical regimen is effective;  continue present plan and medications.       Relevant Medications   allopurinol (ZYLOPRIM) 300 MG tablet   S/P hip replacement    Looking to  have his other hip replaced maybe this summer.      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       Other Visit Diagnoses    Annual physical exam    -  Primary   Relevant Orders   CBC with Differential/Platelet   Comprehensive metabolic panel   Lipid panel   PSA   TSH   Urinalysis, Routine w reflex microscopic   Thyroid disorder screen       Relevant Orders   TSH   Need for Zostavax administration       Relevant Orders   Varicella-zoster vaccine subcutaneous (Completed)   Need for influenza vaccination       Relevant Orders   Flu Vaccine QUAD 36+ mos PF IM (Fluarix & Fluzone Quad PF) (Completed)   Essential hypertension, benign       Relevant Medications   amLODipine (NORVASC) 10 MG tablet   atorvastatin (LIPITOR) 20 MG tablet   carvedilol (COREG) 25 MG tablet   hydrochlorothiazide (HYDRODIURIL) 25 MG tablet   losartan (COZAAR) 100 MG tablet       Follow up plan: Return in about 6 months (around 01/08/2017) for BMP,  Lipids, ALT, AST.

## 2016-07-11 NOTE — Assessment & Plan Note (Addendum)
The current medical regimen is effective;  continue present plan and medications. Followed by endocrinology insulin pump doing well.

## 2016-07-11 NOTE — Assessment & Plan Note (Signed)
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

## 2016-07-11 NOTE — Assessment & Plan Note (Signed)
Looking to  have his other hip replaced maybe this summer.

## 2016-07-12 ENCOUNTER — Telehealth: Payer: Self-pay | Admitting: Family Medicine

## 2016-07-12 DIAGNOSIS — D649 Anemia, unspecified: Secondary | ICD-10-CM

## 2016-07-12 LAB — PSA

## 2016-07-12 LAB — CBC WITH DIFFERENTIAL/PLATELET
BASOS: 0 %
Basophils Absolute: 0 10*3/uL (ref 0.0–0.2)
EOS (ABSOLUTE): 0.2 10*3/uL (ref 0.0–0.4)
EOS: 2 %
HEMATOCRIT: 38.5 % (ref 37.5–51.0)
HEMOGLOBIN: 12.5 g/dL — AB (ref 13.0–17.7)
IMMATURE GRANS (ABS): 0 10*3/uL (ref 0.0–0.1)
IMMATURE GRANULOCYTES: 1 %
LYMPHS: 20 %
Lymphocytes Absolute: 1.7 10*3/uL (ref 0.7–3.1)
MCH: 26.8 pg (ref 26.6–33.0)
MCHC: 32.5 g/dL (ref 31.5–35.7)
MCV: 82 fL (ref 79–97)
MONOCYTES: 7 %
Monocytes Absolute: 0.6 10*3/uL (ref 0.1–0.9)
NEUTROS ABS: 6.2 10*3/uL (ref 1.4–7.0)
NEUTROS PCT: 70 %
PLATELETS: 290 10*3/uL (ref 150–379)
RBC: 4.67 x10E6/uL (ref 4.14–5.80)
RDW: 17.1 % — ABNORMAL HIGH (ref 12.3–15.4)
WBC: 8.7 10*3/uL (ref 3.4–10.8)

## 2016-07-12 LAB — COMPREHENSIVE METABOLIC PANEL
A/G RATIO: 1.5 (ref 1.2–2.2)
ALT: 11 IU/L (ref 0–44)
AST: 15 IU/L (ref 0–40)
Albumin: 4.3 g/dL (ref 3.6–4.8)
Alkaline Phosphatase: 100 IU/L (ref 39–117)
BILIRUBIN TOTAL: 0.3 mg/dL (ref 0.0–1.2)
BUN / CREAT RATIO: 15 (ref 10–24)
BUN: 15 mg/dL (ref 8–27)
CALCIUM: 9.1 mg/dL (ref 8.6–10.2)
CHLORIDE: 102 mmol/L (ref 96–106)
CO2: 26 mmol/L (ref 18–29)
Creatinine, Ser: 1 mg/dL (ref 0.76–1.27)
GFR, EST AFRICAN AMERICAN: 92 (ref >59)
GFR, EST NON AFRICAN AMERICAN: 80 (ref >59)
Globulin, Total: 2.8 (ref 1.5–4.5)
Glucose: 101 mg/dL — ABNORMAL HIGH (ref 65–99)
POTASSIUM: 4.2 mmol/L (ref 3.5–5.2)
Sodium: 144 mmol/L (ref 134–144)
TOTAL PROTEIN: 7.1 g/dL (ref 6.0–8.5)

## 2016-07-12 LAB — LIPID PANEL
CHOL/HDL RATIO: 2.8 (ref 0.0–5.0)
Cholesterol, Total: 90 mg/dL — ABNORMAL LOW (ref 100–199)
HDL: 32 mg/dL — AB (ref >39)
LDL Calculated: 43 (ref 0–99)
Triglycerides: 75 mg/dL (ref 0–149)
VLDL CHOLESTEROL CAL: 15 (ref 5–40)

## 2016-07-12 LAB — TSH: TSH: 1.51 u[IU]/mL (ref 0.450–4.500)

## 2016-07-12 NOTE — Telephone Encounter (Signed)
Phone call Discussed with patient mild anemia normal last month with anemia from your year ago patient not aware of any anemia type workup. No complaints of bleeding bruising or other type blood loss symptoms. We will have patient come back in a week or 2 to recheck CBC.

## 2016-09-12 ENCOUNTER — Other Ambulatory Visit: Payer: Self-pay | Admitting: Family Medicine

## 2016-09-12 DIAGNOSIS — I10 Essential (primary) hypertension: Secondary | ICD-10-CM

## 2016-09-12 DIAGNOSIS — E785 Hyperlipidemia, unspecified: Secondary | ICD-10-CM

## 2017-01-09 ENCOUNTER — Ambulatory Visit: Payer: BLUE CROSS/BLUE SHIELD | Admitting: Family Medicine

## 2017-02-05 LAB — MICROALBUMIN, URINE
MICROALB UR: 9
Microalb, Ur: 9

## 2017-02-15 ENCOUNTER — Ambulatory Visit (INDEPENDENT_AMBULATORY_CARE_PROVIDER_SITE_OTHER): Payer: BLUE CROSS/BLUE SHIELD | Admitting: Family Medicine

## 2017-02-15 VITALS — BP 136/68 | HR 68 | Wt 314.0 lb

## 2017-02-15 DIAGNOSIS — E785 Hyperlipidemia, unspecified: Secondary | ICD-10-CM | POA: Diagnosis not present

## 2017-02-15 DIAGNOSIS — E119 Type 2 diabetes mellitus without complications: Secondary | ICD-10-CM

## 2017-02-15 DIAGNOSIS — Z23 Encounter for immunization: Secondary | ICD-10-CM

## 2017-02-15 DIAGNOSIS — I10 Essential (primary) hypertension: Secondary | ICD-10-CM | POA: Diagnosis not present

## 2017-02-15 DIAGNOSIS — M4802 Spinal stenosis, cervical region: Secondary | ICD-10-CM | POA: Insufficient documentation

## 2017-02-15 DIAGNOSIS — M47812 Spondylosis without myelopathy or radiculopathy, cervical region: Secondary | ICD-10-CM | POA: Diagnosis not present

## 2017-02-15 LAB — LP+ALT+AST PICCOLO, WAIVED
ALT (SGPT) PICCOLO, WAIVED: 16 U/L (ref 10–47)
AST (SGOT) PICCOLO, WAIVED: 15 U/L (ref 11–38)
Chol/HDL Ratio Piccolo,Waive: 2.9 mg/dL
Cholesterol Piccolo, Waived: 89 mg/dL (ref <200)
HDL Chol Piccolo, Waived: 30 mg/dL — ABNORMAL LOW (ref >59)
LDL Chol Calc Piccolo Waived: 35 mg/dL (ref <100)
TRIGLYCERIDES PICCOLO,WAIVED: 117 mg/dL (ref <150)
VLDL Chol Calc Piccolo,Waive: 23 mg/dL (ref <30)

## 2017-02-15 MED ORDER — MELOXICAM 15 MG PO TABS: 15.0000 mg | ORAL_TABLET | Freq: Every day | ORAL | 6 refills | Status: DC

## 2017-02-15 NOTE — Progress Notes (Signed)
BP 136/68 (BP Location: Left Arm)   Pulse 68   Wt (!) 314 lb (142.4 kg)   SpO2 98%   BMI 40.04 kg/m    Subjective:    Patient ID: Aaron [name], male    DOB: [birth year], 66 y.o.   MRN: [id number]  HPI: [name] is a 66 y.o. male  Chief Complaint  Patient presents with  . Follow-up  . Hyperlipidemia  . Hypertension  Patient follow-up doing really well following with endocrinology for diabetes and is on continuous glucose monitoring and insulin pump with great response. Hypertension doing well no complaints from medications taking medications faithfully with good response. Cholesterol doing well on low-dose Lipitor with no complaints or issues. Patient also concerned about spinal stenosis secondary to degenerative arthritis of the cervical spine. Patient with no cervical stenosis symptoms but just arthritis complaints in his neck. Hasn't tried arthritis medications to his knowledge. Reviewed orthopedic notes from this June.  Relevant past medical, surgical, family and social history reviewed and updated as indicated. Interim medical history since our last visit reviewed. Allergies and medications reviewed and updated.  Review of Systems  Constitutional: Negative.   Respiratory: Negative.   Cardiovascular: Negative.     Per HPI unless specifically indicated above     Objective:    BP 136/68 (BP Location: Left Arm)   Pulse 68   Wt (!) 314 lb (142.4 kg)   SpO2 98%   BMI 40.04 kg/m   Wt Readings from Last 3 Encounters:  02/15/17 (!) 314 lb (142.4 kg)  07/11/16 (!) 303 lb (137.4 kg)  06/11/16 300 lb (136.1 kg)    Physical Exam  Constitutional: He is oriented to person, place, and time. He appears well-developed and well-nourished.  HENT:  Head: Normocephalic and atraumatic.  Eyes: Conjunctivae and EOM are normal.  Neck: Normal range of motion.  Cardiovascular: Normal rate, regular rhythm and normal heart sounds.   Pulmonary/Chest: Effort normal and  breath sounds normal.  Musculoskeletal: Normal range of motion.  Neurological: He is alert and oriented to person, place, and time.  Skin: No erythema.  Psychiatric: He has a normal mood and affect. His behavior is normal. Judgment and thought content normal.    Results for orders placed or performed in visit on 07/11/16  CBC with Differential/Platelet  Result Value Ref Range   WBC 8.7 3.4 - 10.8 x10E3/uL   RBC 4.67 4.14 - 5.80 x10E6/uL   Hemoglobin 12.5 (L) 13.0 - 17.7 g/dL   Hematocrit 38.5 37.5 - 51.0 %   MCV 82 79 - 97 fL   MCH 26.8 26.6 - 33.0 pg   MCHC 32.5 31.5 - 35.7 g/dL   RDW 17.1 (H) 12.3 - 15.4 %   Platelets 290 150 - 379 x10E3/uL   Neutrophils 70 Not Estab. %   Lymphs 20 Not Estab. %   Monocytes 7 Not Estab. %   Eos 2 Not Estab. %   Basos 0 Not Estab. %   Neutrophils Absolute 6.2 1.4 - 7.0 x10E3/uL   Lymphocytes Absolute 1.7 0.7 - 3.1 x10E3/uL   Monocytes Absolute 0.6 0.1 - 0.9 x10E3/uL   EOS (ABSOLUTE) 0.2 0.0 - 0.4 x10E3/uL   Basophils Absolute 0.0 0.0 - 0.2 x10E3/uL   Immature Granulocytes 1 Not Estab. %   Immature Grans (Abs) 0.0 0.0 - 0.1 x10E3/uL  Comprehensive metabolic panel  Result Value Ref Range   Glucose 101 (H) 65 - 99 mg/dL   BUN 15 8 - 27  mg/dL   Creatinine, Ser 1.00 0.76 - 1.27 mg/dL   GFR calc non Af Amer 80 >59   GFR calc Af Amer 92 >59   BUN/Creatinine Ratio 15 10 - 24   Sodium 144 134 - 144 mmol/L   Potassium 4.2 3.5 - 5.2 mmol/L   Chloride 102 96 - 106 mmol/L   CO2 26 18 - 29 mmol/L   Calcium 9.1 8.6 - 10.2 mg/dL   Total Protein 7.1 6.0 - 8.5 g/dL   Albumin 4.3 3.6 - 4.8 g/dL   Globulin, Total 2.8 1.5 - 4.5   Albumin/Globulin Ratio 1.5 1.2 - 2.2   Bilirubin Total 0.3 0.0 - 1.2 mg/dL   Alkaline Phosphatase 100 39 - 117 IU/L   AST 15 0 - 40 IU/L   ALT 11 0 - 44 IU/L  Lipid panel  Result Value Ref Range   Cholesterol, Total 90 (L) 100 - 199 mg/dL   Triglycerides 75 0 - 149 mg/dL   HDL 32 (L) >39 mg/dL   VLDL Cholesterol Cal 15 5  - 40   LDL Calculated 43 0 - 99   Chol/HDL Ratio 2.8 0.0 - 5.0  PSA  Result Value Ref Range   Prostate Specific Ag, Serum <0.1 0.0 - 4.0 ng/mL  TSH  Result Value Ref Range   TSH 1.510 0.450 - 4.500 uIU/mL  Urinalysis, Routine w reflex microscopic  Result Value Ref Range   Specific Gravity, UA 1.020 1.005 - 1.030   pH, UA 6.0 5.0 - 7.5   Color, UA Yellow Yellow   Appearance Ur Clear Clear   Leukocytes, UA Negative Negative   Protein, UA Negative Negative/Trace   Glucose, UA Negative Negative   Ketones, UA Negative Negative   RBC, UA Trace (A) Negative   Bilirubin, UA Negative Negative   Urobilinogen, Ur 1.0 0.2 - 1.0 mg/dL   Nitrite, UA Negative Negative      Assessment & Plan:   Problem List Items Addressed This Visit      Cardiovascular and Mediastinum   Hypertension - Primary    The current medical regimen is effective;  continue present plan and medications.       Relevant Orders   Basic metabolic panel   LP+ALT+AST Piccolo, [place]     Endocrine   Diabetes mellitus without complication ([place])   Relevant Orders   Basic metabolic panel   LP+ALT+AST Piccolo, Waived     Musculoskeletal and Integument   Degenerative arthritis of cervical spine    Discussed care and treatment will try meloxicam      Relevant Medications   meloxicam (MOBIC) 15 MG tablet     Other   Hyperlipidemia    The current medical regimen is effective;  continue present plan and medications.        Other Visit Diagnoses    Needs flu shot       Relevant Orders   Flu Vaccine QUAD 6+ mos PF IM (Fluarix Quad PF) (Completed)       Follow up plan: Return in about 6 months (around 08/15/2017) for Physical Exam.

## 2017-02-15 NOTE — Patient Instructions (Signed)

## 2017-02-15 NOTE — Assessment & Plan Note (Signed)
The current medical regimen is effective;  continue present plan and medications.  

## 2017-02-15 NOTE — Assessment & Plan Note (Signed)
Discussed care and treatment will try meloxicam

## 2017-02-16 LAB — BASIC METABOLIC PANEL
BUN / CREAT RATIO: 13 (ref 10–24)
BUN: 17 mg/dL (ref 8–27)
CALCIUM: 9.6 mg/dL (ref 8.6–10.2)
CHLORIDE: 100 mmol/L (ref 96–106)
CO2: 27 mmol/L (ref 20–29)
CREATININE: 1.36 mg/dL — AB (ref 0.76–1.27)
GFR, EST AFRICAN AMERICAN: 63 mL/min/{1.73_m2} (ref >59)
GFR, EST NON AFRICAN AMERICAN: 55 mL/min/{1.73_m2} — AB (ref >59)
Glucose: 158 mg/dL — ABNORMAL HIGH (ref 65–99)
Potassium: 4.2 mmol/L (ref 3.5–5.2)
Sodium: 147 mmol/L — ABNORMAL HIGH (ref 134–144)

## 2017-06-06 ENCOUNTER — Other Ambulatory Visit: Payer: Self-pay | Admitting: Family Medicine

## 2017-06-06 DIAGNOSIS — I10 Essential (primary) hypertension: Secondary | ICD-10-CM

## 2017-06-06 DIAGNOSIS — E7800 Pure hypercholesterolemia, unspecified: Secondary | ICD-10-CM

## 2017-06-06 DIAGNOSIS — M1A0720 Idiopathic chronic gout, left ankle and foot, without tophus (tophi): Secondary | ICD-10-CM

## 2017-06-06 MED ORDER — CARVEDILOL 25 MG PO TABS: 25.0000 mg | ORAL_TABLET | Freq: Two times a day (BID) | ORAL | 0 refills | Status: DC

## 2017-06-06 MED ORDER — HYDROCHLOROTHIAZIDE 25 MG PO TABS: 25.0000 mg | ORAL_TABLET | Freq: Every day | ORAL | 0 refills | Status: DC

## 2017-06-06 MED ORDER — LOSARTAN POTASSIUM 100 MG PO TABS: 100.0000 mg | ORAL_TABLET | Freq: Every day | ORAL | 0 refills | Status: DC

## 2017-06-06 MED ORDER — ALLOPURINOL 300 MG PO TABS: 300.0000 mg | ORAL_TABLET | Freq: Every day | ORAL | 0 refills | Status: DC

## 2017-06-06 MED ORDER — AMLODIPINE BESYLATE 10 MG PO TABS: 10.0000 mg | ORAL_TABLET | Freq: Every day | ORAL | 0 refills | Status: DC

## 2017-06-06 MED ORDER — ATORVASTATIN CALCIUM 20 MG PO TABS: 20.0000 mg | ORAL_TABLET | Freq: Every day | ORAL | 0 refills | Status: DC

## 2017-08-15 ENCOUNTER — Ambulatory Visit (INDEPENDENT_AMBULATORY_CARE_PROVIDER_SITE_OTHER): Payer: Medicare HMO | Admitting: Family Medicine

## 2017-08-15 ENCOUNTER — Encounter: Payer: Self-pay | Admitting: Family Medicine

## 2017-08-15 VITALS — BP 135/80 | HR 73 | Ht 74.0 in | Wt 317.0 lb

## 2017-08-15 DIAGNOSIS — Z1329 Encounter for screening for other suspected endocrine disorder: Secondary | ICD-10-CM

## 2017-08-15 DIAGNOSIS — E7800 Pure hypercholesterolemia, unspecified: Secondary | ICD-10-CM

## 2017-08-15 DIAGNOSIS — M1A0720 Idiopathic chronic gout, left ankle and foot, without tophus (tophi): Secondary | ICD-10-CM | POA: Diagnosis not present

## 2017-08-15 DIAGNOSIS — Z6841 Body Mass Index (BMI) 40.0 and over, adult: Secondary | ICD-10-CM

## 2017-08-15 DIAGNOSIS — C61 Malignant neoplasm of prostate: Secondary | ICD-10-CM

## 2017-08-15 DIAGNOSIS — G4730 Sleep apnea, unspecified: Secondary | ICD-10-CM | POA: Diagnosis not present

## 2017-08-15 DIAGNOSIS — I10 Essential (primary) hypertension: Secondary | ICD-10-CM

## 2017-08-15 DIAGNOSIS — M47812 Spondylosis without myelopathy or radiculopathy, cervical region: Secondary | ICD-10-CM | POA: Diagnosis not present

## 2017-08-15 DIAGNOSIS — Z794 Long term (current) use of insulin: Secondary | ICD-10-CM | POA: Diagnosis not present

## 2017-08-15 DIAGNOSIS — E119 Type 2 diabetes mellitus without complications: Secondary | ICD-10-CM

## 2017-08-15 DIAGNOSIS — Z7189 Other specified counseling: Secondary | ICD-10-CM | POA: Insufficient documentation

## 2017-08-15 DIAGNOSIS — E782 Mixed hyperlipidemia: Secondary | ICD-10-CM | POA: Diagnosis not present

## 2017-08-15 LAB — MICROSCOPIC EXAMINATION: Bacteria, UA: NONE SEEN

## 2017-08-15 LAB — URINALYSIS, ROUTINE W REFLEX MICROSCOPIC
BILIRUBIN UA: NEGATIVE
Glucose, UA: NEGATIVE
Ketones, UA: NEGATIVE
LEUKOCYTES UA: NEGATIVE
Nitrite, UA: NEGATIVE
PH UA: 6.5 (ref 5.0–7.5)
Protein, UA: NEGATIVE
Specific Gravity, UA: 1.015 (ref 1.005–1.030)
Urobilinogen, Ur: 0.2 mg/dL (ref 0.2–1.0)

## 2017-08-15 LAB — HEMOGLOBIN A1C: Hemoglobin A1C: 7.6

## 2017-08-15 MED ORDER — LOSARTAN POTASSIUM 100 MG PO TABS: 100.0000 mg | ORAL_TABLET | Freq: Every day | ORAL | 4 refills | Status: DC

## 2017-08-15 MED ORDER — HYDROCHLOROTHIAZIDE 25 MG PO TABS: 25.0000 mg | ORAL_TABLET | Freq: Every day | ORAL | 4 refills | Status: DC

## 2017-08-15 MED ORDER — ALLOPURINOL 300 MG PO TABS: 300.0000 mg | ORAL_TABLET | Freq: Every day | ORAL | 4 refills | Status: DC

## 2017-08-15 MED ORDER — AMLODIPINE BESYLATE 10 MG PO TABS: 10.0000 mg | ORAL_TABLET | Freq: Every day | ORAL | 4 refills | Status: DC

## 2017-08-15 MED ORDER — ATORVASTATIN CALCIUM 20 MG PO TABS: 20.0000 mg | ORAL_TABLET | Freq: Every day | ORAL | 4 refills | Status: DC

## 2017-08-15 MED ORDER — CARVEDILOL 25 MG PO TABS: 25.0000 mg | ORAL_TABLET | Freq: Two times a day (BID) | ORAL | 4 refills | Status: DC

## 2017-08-15 NOTE — Progress Notes (Signed)
BP 135/80   Pulse 73   Ht 6\' 2"  (1.88 m)   Wt (!) 317 lb (143.8 kg)   SpO2 98%   BMI 40.70 kg/m    Subjective:    Patient ID: Aaron [name], male    DOB: 01-27-1951, 67 y.o.   MRN: [id number]  HPI: Aaron [name] is a 67 y.o. male  Chief Complaint  Patient presents with  . Annual Exam   Patient all in all doing well no complaints no gout issues. Blood pressure doing well with no issues with medications. Cholesterol also doing well. Diabetes has had a few low blood sugar spells but nothing special and doing well otherwise. Takes meloxicam for his neck which is not doing any good. Patient's retired and quit work and is less active and is gained 14 pounds over this last year.  Relevant past medical, surgical, family and social history reviewed and updated as indicated. Interim medical history since our last visit reviewed. Allergies and medications reviewed and updated.  Review of Systems  Constitutional: Negative.   HENT: Negative.   Eyes: Negative.   Respiratory: Negative.   Cardiovascular: Negative.   Gastrointestinal: Negative.   Endocrine: Negative.   Genitourinary: Negative.   Musculoskeletal: Negative.   Skin: Negative.   Allergic/Immunologic: Negative.   Neurological: Negative.   Hematological: Negative.   Psychiatric/Behavioral: Negative.     Per HPI unless specifically indicated above     Objective:    BP 135/80   Pulse 73   Ht 6\' 2"  (1.88 m)   Wt (!) 317 lb (143.8 kg)   SpO2 98%   BMI 40.70 kg/m   Wt Readings from Last 3 Encounters:  08/15/17 (!) 317 lb (143.8 kg)  02/15/17 (!) 314 lb (142.4 kg)  07/11/16 (!) 303 lb (137.4 kg)    Physical Exam  Constitutional: He is oriented to person, place, and time. He appears well-developed and well-nourished.  HENT:  Head: Normocephalic and atraumatic.  Right Ear: External ear normal.  Left Ear: External ear normal.  Eyes: Pupils are equal, round, and reactive to light. Conjunctivae and EOM are  normal.  Neck: Normal range of motion. Neck supple.  Cardiovascular: Normal rate, regular rhythm, normal heart sounds and intact distal pulses.  Pulmonary/Chest: Effort normal and breath sounds normal.  Abdominal: Soft. Bowel sounds are normal. There is no splenomegaly or hepatomegaly.  Genitourinary: Rectum normal, prostate normal and penis normal.  Musculoskeletal: Normal range of motion.  Limited range of motion of neck due to arthritis  Neurological: He is alert and oriented to person, place, and time. He has normal reflexes.  Skin: No rash noted. No erythema.  Psychiatric: He has a normal mood and affect. His behavior is normal. Judgment and thought content normal.    Results for orders placed or performed in visit on 72/53/66  Basic metabolic panel  Result Value Ref Range   Glucose 158 (H) 65 - 99 mg/dL   BUN 17 8 - 27 mg/dL   Creatinine, Ser 1.36 (H) 0.76 - 1.27 mg/dL   GFR calc non Af Amer 55 (L) >59 mL/min/1.73   GFR calc Af Amer 63 >59 mL/min/1.73   BUN/Creatinine Ratio 13 10 - 24   Sodium 147 (H) 134 - 144 mmol/L   Potassium 4.2 3.5 - 5.2 mmol/L   Chloride 100 96 - 106 mmol/L   CO2 27 20 - 29 mmol/L   Calcium 9.6 8.6 - 10.2 mg/dL  LP+ALT+AST Piccolo, Waived  Result Value Ref  Range   ALT (SGPT) Piccolo, Waived 16 10 - 47 U/L   AST (SGOT) Piccolo, Waived 15 11 - 38 U/L   Cholesterol Piccolo, Waived 89 <200 mg/dL   HDL Chol Piccolo, Waived 30 (L) >59 mg/dL   Triglycerides Piccolo,Waived 117 <150 mg/dL   Chol/HDL Ratio Piccolo,Waive 2.9 mg/dL   LDL Chol Calc Piccolo Waived 35 <100 mg/dL   VLDL Chol Calc Piccolo,Waive 23 <30 mg/dL      Assessment & Plan:   Problem List Items Addressed This Visit      Cardiovascular and Mediastinum   Hypertension - Primary    The current medical regimen is effective;  continue present plan and medications.       Relevant Medications   losartan (COZAAR) 100 MG tablet   hydrochlorothiazide (HYDRODIURIL) 25 MG tablet   carvedilol  (COREG) 25 MG tablet   atorvastatin (LIPITOR) 20 MG tablet   amLODipine (NORVASC) 10 MG tablet   Other Relevant Orders   CBC with Differential/Platelet   Comprehensive metabolic panel   Urinalysis, Routine w reflex microscopic     Respiratory   Sleep apnea    Uses every night        Endocrine   Diabetes mellitus without complication ([facility])    Followed by endo      Relevant Medications   losartan (COZAAR) 100 MG tablet   atorvastatin (LIPITOR) 20 MG tablet     Musculoskeletal and Integument   Degenerative arthritis of cervical spine    stable      Relevant Medications   allopurinol (ZYLOPRIM) 300 MG tablet     Genitourinary   Prostate cancer ([facility])   Relevant Medications   allopurinol (ZYLOPRIM) 300 MG tablet   Other Relevant Orders   PSA     Other   Hyperlipidemia    The current medical regimen is effective;  continue present plan and medications.       Relevant Medications   losartan (COZAAR) 100 MG tablet   hydrochlorothiazide (HYDRODIURIL) 25 MG tablet   carvedilol (COREG) 25 MG tablet   atorvastatin (LIPITOR) 20 MG tablet   amLODipine (NORVASC) 10 MG tablet   Other Relevant Orders   CBC with Differential/Platelet   Comprehensive metabolic panel   Lipid panel   Urinalysis, Routine w reflex microscopic   Gout    The current medical regimen is effective;  continue present plan and medications.       Relevant Medications   allopurinol (ZYLOPRIM) 300 MG tablet   Other Relevant Orders   Uric acid   BMI 40.0-44.9, adult ([facility])    Wt loss discussion      Advanced care planning/counseling discussion    A voluntary discussion about advance care planning including the explanation and discussion of advance directives was extensively discussed  with the patient.  Explanation about the health care proxy and Living will was reviewed and packet with forms with explanation of how to fill them out was given.    Time spent:  Encounter 16+ min      Individuals  present: pt        Other Visit Diagnoses    Thyroid disorder screen       Relevant Orders   TSH   Essential hypertension, benign       Relevant Medications   losartan (COZAAR) 100 MG tablet   hydrochlorothiazide (HYDRODIURIL) 25 MG tablet   carvedilol (COREG) 25 MG tablet   atorvastatin (LIPITOR) 20 MG tablet   amLODipine (NORVASC) 10 MG  tablet       Follow up plan: Return in about 6 months (around 02/15/2018) for BMP,  Lipids, ALT, AST.

## 2017-08-15 NOTE — Assessment & Plan Note (Signed)
A voluntary discussion about advance care planning including the explanation and discussion of advance directives was extensively discussed  with the patient.  Explanation about the health care proxy and Living will was reviewed and packet with forms with explanation of how to fill them out was given.    Time spent:   Encounter 16+ min     Individuals present pt  

## 2017-08-15 NOTE — Assessment & Plan Note (Signed)
The current medical regimen is effective;  continue present plan and medications.  

## 2017-08-15 NOTE — Assessment & Plan Note (Signed)
Wt loss discussion

## 2017-08-15 NOTE — Assessment & Plan Note (Signed)
Followed by endo.  

## 2017-08-15 NOTE — Assessment & Plan Note (Signed)
Uses every night

## 2017-08-15 NOTE — Assessment & Plan Note (Signed)
stable °

## 2017-08-16 ENCOUNTER — Telehealth: Payer: Self-pay | Admitting: Family Medicine

## 2017-08-16 DIAGNOSIS — R748 Abnormal levels of other serum enzymes: Secondary | ICD-10-CM

## 2017-08-16 LAB — CBC WITH DIFFERENTIAL/PLATELET
BASOS ABS: 0 10*3/uL (ref 0.0–0.2)
Basos: 1 %
EOS (ABSOLUTE): 0.2 10*3/uL (ref 0.0–0.4)
Eos: 2 %
HEMOGLOBIN: 13.7 g/dL (ref 13.0–17.7)
Hematocrit: 41.3 % (ref 37.5–51.0)
IMMATURE GRANS (ABS): 0 10*3/uL (ref 0.0–0.1)
Immature Granulocytes: 0 %
LYMPHS: 20 %
Lymphocytes Absolute: 1.7 10*3/uL (ref 0.7–3.1)
MCH: 27.3 pg (ref 26.6–33.0)
MCHC: 33.2 g/dL (ref 31.5–35.7)
MCV: 82 fL (ref 79–97)
MONOCYTES: 8 %
Monocytes Absolute: 0.7 10*3/uL (ref 0.1–0.9)
NEUTROS ABS: 6 10*3/uL (ref 1.4–7.0)
Neutrophils: 69 %
PLATELETS: 262 10*3/uL (ref 150–379)
RBC: 5.01 x10E6/uL (ref 4.14–5.80)
RDW: 15.8 % — ABNORMAL HIGH (ref 12.3–15.4)
WBC: 8.6 10*3/uL (ref 3.4–10.8)

## 2017-08-16 LAB — COMPREHENSIVE METABOLIC PANEL
ALBUMIN: 4.4 g/dL (ref 3.6–4.8)
ALK PHOS: 137 IU/L — AB (ref 39–117)
ALT: 12 IU/L (ref 0–44)
AST: 14 IU/L (ref 0–40)
Albumin/Globulin Ratio: 1.5 (ref 1.2–2.2)
BILIRUBIN TOTAL: 0.4 mg/dL (ref 0.0–1.2)
BUN/Creatinine Ratio: 15 (ref 10–24)
BUN: 17 mg/dL (ref 8–27)
CHLORIDE: 101 mmol/L (ref 96–106)
CO2: 26 mmol/L (ref 20–29)
CREATININE: 1.12 mg/dL (ref 0.76–1.27)
Calcium: 9.6 mg/dL (ref 8.6–10.2)
GFR calc Af Amer: 79 mL/min/{1.73_m2} (ref >59)
GFR calc non Af Amer: 69 mL/min/{1.73_m2} (ref >59)
GLUCOSE: 172 mg/dL — AB (ref 65–99)
Globulin, Total: 3 g/dL (ref 1.5–4.5)
Potassium: 4.3 mmol/L (ref 3.5–5.2)
Sodium: 143 mmol/L (ref 134–144)
TOTAL PROTEIN: 7.4 g/dL (ref 6.0–8.5)

## 2017-08-16 LAB — PSA: Prostate Specific Ag, Serum: <0.1 ng/mL (ref 0.0–4.0)

## 2017-08-16 LAB — LIPID PANEL
CHOLESTEROL TOTAL: 103 mg/dL (ref 100–199)
Chol/HDL Ratio: 3.1 ratio (ref 0.0–5.0)
HDL: 33 mg/dL — ABNORMAL LOW (ref >39)
LDL CALC: 49 mg/dL (ref 0–99)
TRIGLYCERIDES: 107 mg/dL (ref 0–149)
VLDL Cholesterol Cal: 21 mg/dL (ref 5–40)

## 2017-08-16 LAB — TSH: TSH: 2.78 u[IU]/mL (ref 0.450–4.500)

## 2017-08-16 LAB — URIC ACID: URIC ACID: 4.2 mg/dL (ref 3.7–8.6)

## 2017-08-16 NOTE — Telephone Encounter (Signed)
Phone call Discussed with patient elevated alkaline phosphatase will recheck in a month or so.

## 2017-08-16 NOTE — Telephone Encounter (Signed)
-----   Message from Amada Kingfisher, [place] sent at 08/16/2017 12:00 PM EDT ----- Patient was transferred to provider for telephone conversation.

## 2017-08-22 ENCOUNTER — Telehealth: Payer: Self-pay | Admitting: Family Medicine

## 2017-08-22 DIAGNOSIS — G4730 Sleep apnea, unspecified: Secondary | ICD-10-CM

## 2017-08-22 DIAGNOSIS — Z9641 Presence of insulin pump (external) (internal): Secondary | ICD-10-CM | POA: Diagnosis not present

## 2017-08-22 DIAGNOSIS — Z794 Long term (current) use of insulin: Secondary | ICD-10-CM | POA: Diagnosis not present

## 2017-08-22 DIAGNOSIS — E119 Type 2 diabetes mellitus without complications: Secondary | ICD-10-CM | POA: Diagnosis not present

## 2017-08-22 NOTE — Telephone Encounter (Signed)
Copied from [place]. Topic: Quick Communication - See Telephone Encounter >> Aug 22, 2017  1:19 PM Conception Chancy, NT wrote: CRM for notification. See Telephone encounter for: 08/22/17.  Patient is calling and states he needs a new cpap machine with a mask and tubing faxed to [facility] fax# [phone]

## 2017-08-23 MED ORDER — AMBULATORY NON FORMULARY MEDICATION: 0 refills | Status: DC

## 2017-08-23 NOTE — Telephone Encounter (Signed)
RX and sleep study faxed.

## 2017-08-23 NOTE — Telephone Encounter (Signed)
Ok to write? 

## 2017-08-23 NOTE — Telephone Encounter (Signed)
Okay to write? Please advise.   Pt did have sleep study done 11/01/2009. Report printed from [facility]

## 2017-08-30 NOTE — Telephone Encounter (Signed)
done

## 2017-08-30 NOTE — Telephone Encounter (Signed)
[facility] Fax: [phone]

## 2017-08-30 NOTE — Telephone Encounter (Signed)
Aaron [name] is needing a new Rx or documentation so that they can fill the request for the cpap machine, contact the number that is associated w/ the contact info w/ this Telephone Encounter

## 2017-08-31 NOTE — Telephone Encounter (Signed)
RX faxed to [facility]

## 2017-09-03 NOTE — Telephone Encounter (Signed)
OV notes along with RX faxed to [facility] [place] @ [phone]

## 2017-09-03 NOTE — Telephone Encounter (Signed)
Spoke with patient. He stated he uses '12'

## 2017-09-03 NOTE — Telephone Encounter (Signed)
Report printed and given to P.A.

## 2017-09-03 NOTE — Telephone Encounter (Signed)
Please review and update RX. Please advise.

## 2017-09-03 NOTE — Telephone Encounter (Signed)
Can you pull sleep study records on him?

## 2017-09-03 NOTE — Telephone Encounter (Signed)
Rx written and given to [facility]

## 2017-09-03 NOTE — Telephone Encounter (Signed)
I don't see a recommendation for pressure settings in sleep study - can we call the pt and ask if he knows what setting he's been on or if it's an auto-titrated machine?

## 2017-09-03 NOTE — Telephone Encounter (Signed)
Shanon Brow w/ [facility] calling to request Rx for the CPAP with pressure setting be faxed to their office at  [phone]. They will wait 2 days then hold the order if they do not receive the documents needed

## 2017-09-05 DIAGNOSIS — G4733 Obstructive sleep apnea (adult) (pediatric): Secondary | ICD-10-CM | POA: Diagnosis not present

## 2017-09-06 ENCOUNTER — Encounter: Payer: Self-pay | Admitting: Family Medicine

## 2017-09-06 ENCOUNTER — Ambulatory Visit (INDEPENDENT_AMBULATORY_CARE_PROVIDER_SITE_OTHER): Payer: Medicare HMO | Admitting: Family Medicine

## 2017-09-06 VITALS — BP 159/82 | HR 76 | Wt 321.4 lb

## 2017-09-06 DIAGNOSIS — R319 Hematuria, unspecified: Secondary | ICD-10-CM | POA: Diagnosis not present

## 2017-09-06 DIAGNOSIS — N3001 Acute cystitis with hematuria: Secondary | ICD-10-CM

## 2017-09-06 MED ORDER — CIPROFLOXACIN HCL 500 MG PO TABS: 500.0000 mg | ORAL_TABLET | Freq: Two times a day (BID) | ORAL | 0 refills | Status: DC

## 2017-09-06 NOTE — Progress Notes (Signed)
BP (!) 159/82 (BP Location: Left Arm, Patient Position: Sitting, Cuff Size: Large)   Pulse 76   Wt (!) 321 lb 6 oz (145.8 kg)   SpO2 99%   BMI 41.26 kg/m    Subjective:    Patient ID: Aaron [name], male    DOB: [birth year], 67 y.o.   MRN: [id number]  HPI: JB DULWORTH is a 67 y.o. male  Chief Complaint  Patient presents with  . Urinary Tract Infection    hematuria and frequency.   URINARY SYMPTOMS Duration: 4-5 days Dysuria: burning Urinary frequency: yes Urgency: yes Small volume voids: no Symptom severity: moderate Urinary incontinence: no Foul odor: no Hematuria: yes Abdominal pain: no Back pain: no Suprapubic pain/pressure: no Flank pain: no Fever:  no Vomiting: no Relief with cranberry juice: no Relief with pyridium: no Status: stable Previous urinary tract infection: yes Recurrent urinary tract infection: no History of sexually transmitted disease: no Penile discharge: no Treatments attempted: pyridium, cranberry and increasing fluids    Relevant past medical, surgical, family and social history reviewed and updated as indicated. Interim medical history since our last visit reviewed. Allergies and medications reviewed and updated.  Review of Systems  Constitutional: Negative.   Respiratory: Negative.   Cardiovascular: Negative.   Gastrointestinal: Negative.   Genitourinary: Positive for dysuria, flank pain, frequency, hematuria and urgency. Negative for decreased urine volume, difficulty urinating, discharge, enuresis, genital sores, penile pain, penile swelling, scrotal swelling and testicular pain.  Musculoskeletal: Negative for arthralgias, back pain, gait problem, joint swelling, myalgias, neck pain and neck stiffness.  Skin: Negative.   Psychiatric/Behavioral: Negative.     Per HPI unless specifically indicated above     Objective:    BP (!) 159/82 (BP Location: Left Arm, Patient Position: Sitting, Cuff Size: Large)   Pulse 76   Wt  (!) 321 lb 6 oz (145.8 kg)   SpO2 99%   BMI 41.26 kg/m   Wt Readings from Last 3 Encounters:  09/06/17 (!) 321 lb 6 oz (145.8 kg)  08/15/17 (!) 317 lb (143.8 kg)  02/15/17 (!) 314 lb (142.4 kg)    Physical Exam  Constitutional: He is oriented to person, place, and time. He appears well-developed and well-nourished. No distress.  HENT:  Head: Normocephalic and atraumatic.  Right Ear: Hearing normal.  Left Ear: Hearing normal.  Nose: Nose normal.  Eyes: Conjunctivae and lids are normal. Right eye exhibits no discharge. Left eye exhibits no discharge. No scleral icterus.  Cardiovascular: Normal rate, regular rhythm, normal heart sounds and intact distal pulses. Exam reveals no gallop and no friction rub.  No murmur heard. Pulmonary/Chest: Effort normal and breath sounds normal. No stridor. No respiratory distress. He has no wheezes. He has no rales. He exhibits no tenderness.  Abdominal: Soft. Bowel sounds are normal. He exhibits no distension and no mass. There is no tenderness. There is no rebound and no guarding. No hernia.  Musculoskeletal: Normal range of motion. He exhibits no edema, tenderness or deformity.  Neurological: He is alert and oriented to person, place, and time.  Skin: Skin is warm, dry and intact. Capillary refill takes less than 2 seconds. No rash noted. He is not diaphoretic. No erythema. No pallor.  Psychiatric: He has a normal mood and affect. His speech is normal and behavior is normal. Judgment and thought content normal. Cognition and memory are normal.  Nursing note and vitals reviewed.   Results for orders placed or performed in visit on 08/15/17  Microscopic  Examination  Result Value Ref Range   WBC, UA 0-5 0 - 5 /hpf   RBC, UA 0-2 0 - 2 /hpf   Epithelial Cells (non renal) 0-10 0 - 10 /hpf   Bacteria, UA None seen None seen/Few  CBC with Differential/Platelet  Result Value Ref Range   WBC 8.6 3.4 - 10.8 x10E3/uL   RBC 5.01 4.14 - 5.80 x10E6/uL    Hemoglobin 13.7 13.0 - 17.7 g/dL   Hematocrit 41.3 37.5 - 51.0 %   MCV 82 79 - 97 fL   MCH 27.3 26.6 - 33.0 pg   MCHC 33.2 31.5 - 35.7 g/dL   RDW 15.8 (H) 12.3 - 15.4 %   Platelets 262 150 - 379 x10E3/uL   Neutrophils 69 Not Estab. %   Lymphs 20 Not Estab. %   Monocytes 8 Not Estab. %   Eos 2 Not Estab. %   Basos 1 Not Estab. %   Neutrophils Absolute 6.0 1.4 - 7.0 x10E3/uL   Lymphocytes Absolute 1.7 0.7 - 3.1 x10E3/uL   Monocytes Absolute 0.7 0.1 - 0.9 x10E3/uL   EOS (ABSOLUTE) 0.2 0.0 - 0.4 x10E3/uL   Basophils Absolute 0.0 0.0 - 0.2 x10E3/uL   Immature Granulocytes 0 Not Estab. %   Immature Grans (Abs) 0.0 0.0 - 0.1 x10E3/uL  Comprehensive metabolic panel  Result Value Ref Range   Glucose 172 (H) 65 - 99 mg/dL   BUN 17 8 - 27 mg/dL   Creatinine, Ser 1.12 0.76 - 1.27 mg/dL   GFR calc non Af Amer 69 >59 mL/min/1.73   GFR calc Af Amer 79 >59 mL/min/1.73   BUN/Creatinine Ratio 15 10 - 24   Sodium 143 134 - 144 mmol/L   Potassium 4.3 3.5 - 5.2 mmol/L   Chloride 101 96 - 106 mmol/L   CO2 26 20 - 29 mmol/L   Calcium 9.6 8.6 - 10.2 mg/dL   Total Protein 7.4 6.0 - 8.5 g/dL   Albumin 4.4 3.6 - 4.8 g/dL   Globulin, Total 3.0 1.5 - 4.5 g/dL   Albumin/Globulin Ratio 1.5 1.2 - 2.2   Bilirubin Total 0.4 0.0 - 1.2 mg/dL   Alkaline Phosphatase 137 (H) 39 - 117 IU/L   AST 14 0 - 40 IU/L   ALT 12 0 - 44 IU/L  Lipid panel  Result Value Ref Range   Cholesterol, Total 103 100 - 199 mg/dL   Triglycerides 107 0 - 149 mg/dL   HDL 33 (L) >39 mg/dL   VLDL Cholesterol Cal 21 5 - 40 mg/dL   LDL Calculated 49 0 - 99 mg/dL   Chol/HDL Ratio 3.1 0.0 - 5.0 ratio  PSA  Result Value Ref Range   Prostate Specific Ag, Serum <0.1 0.0 - 4.0 ng/mL  TSH  Result Value Ref Range   TSH 2.780 0.450 - 4.500 uIU/mL  Urinalysis, Routine w reflex microscopic  Result Value Ref Range   Specific Gravity, UA 1.015 1.005 - 1.030   pH, UA 6.5 5.0 - 7.5   Color, UA Yellow Yellow   Appearance Ur Clear Clear    Leukocytes, UA Negative Negative   Protein, UA Negative Negative/Trace   Glucose, UA Negative Negative   Ketones, UA Negative Negative   RBC, UA Trace (A) Negative   Bilirubin, UA Negative Negative   Urobilinogen, Ur 0.2 0.2 - 1.0 mg/dL   Nitrite, UA Negative Negative   Microscopic Examination See below:   Uric acid  Result Value Ref Range   Uric Acid  4.2 3.7 - 8.6 mg/dL      Assessment & Plan:   Problem List Items Addressed This Visit    None    Visit Diagnoses    Acute cystitis with hematuria    -  Primary   Will treat with 2 weeks of cipro and recheck in about 20 days. Call with any concerns.    Relevant Orders   UA/M w/rflx Culture, Routine   Hematuria, unspecified type       + UTI- will treat   Relevant Orders   UA/M w/rflx Culture, Routine       Follow up plan: Return 20 days for lab visit for repeat urine, otherwise as scheduled.

## 2017-09-09 LAB — UA/M W/RFLX CULTURE, ROUTINE
Bilirubin, UA: NEGATIVE
Glucose, UA: NEGATIVE
Nitrite, UA: NEGATIVE
Specific Gravity, UA: 1.02 (ref 1.005–1.030)
Urobilinogen, Ur: 1 mg/dL (ref 0.2–1.0)
pH, UA: 7 (ref 5.0–7.5)

## 2017-09-09 LAB — MICROSCOPIC EXAMINATION: WBC, UA: >30 /hpf — AB (ref 0–5)

## 2017-09-09 LAB — URINE CULTURE, REFLEX

## 2017-09-27 ENCOUNTER — Other Ambulatory Visit: Payer: Medicare HMO

## 2017-09-27 DIAGNOSIS — N3001 Acute cystitis with hematuria: Secondary | ICD-10-CM | POA: Diagnosis not present

## 2017-09-27 LAB — UA/M W/RFLX CULTURE, ROUTINE
Bilirubin, UA: NEGATIVE
Glucose, UA: NEGATIVE
Ketones, UA: NEGATIVE
Leukocytes, UA: NEGATIVE
NITRITE UA: NEGATIVE
Specific Gravity, UA: 1.025 (ref 1.005–1.030)
Urobilinogen, Ur: 0.2 mg/dL (ref 0.2–1.0)
pH, UA: 6 (ref 5.0–7.5)

## 2017-09-27 LAB — MICROSCOPIC EXAMINATION

## 2017-10-01 ENCOUNTER — Telehealth: Payer: Self-pay | Admitting: Family Medicine

## 2017-10-01 DIAGNOSIS — N3001 Acute cystitis with hematuria: Secondary | ICD-10-CM

## 2017-10-01 NOTE — Telephone Encounter (Signed)
Copied from [place] [id number]. Topic: Quick Communication - See Telephone Encounter >> Oct 01, 2017  2:30 PM Vernona Rieger wrote: CRM for notification. See Telephone encounter for: 10/01/17. Patient states he had labs done on Thursday 5/9. He said is wanting to know the results and what he needs to further do because his condition has not changed. Call back is [phone]

## 2017-10-01 NOTE — Telephone Encounter (Signed)
Please advise 

## 2017-10-01 NOTE — Telephone Encounter (Signed)
Spoke with Merry Proud at [facility]. No culture was done as it was a reflex culture and did not meet the requirements. Provider was made aware. Asked to have pt come in for another sample or refer to urology. Pt wishes to leave another specimen. Will come by in the morning.

## 2017-10-01 NOTE — Telephone Encounter (Signed)
Culture still not back- can we check on this please?

## 2017-10-05 DIAGNOSIS — G4733 Obstructive sleep apnea (adult) (pediatric): Secondary | ICD-10-CM | POA: Diagnosis not present

## 2017-10-10 ENCOUNTER — Telehealth: Payer: Self-pay | Admitting: Family Medicine

## 2017-10-10 DIAGNOSIS — N3001 Acute cystitis with hematuria: Secondary | ICD-10-CM

## 2017-10-10 NOTE — Telephone Encounter (Signed)
Copied from [place] [id number]. Topic: Referral - Request >> Oct 10, 2017  3:35 PM Robina Ade, Helene Kelp D wrote: Reason for CRM: Patient called and said that his UTI has not gone away and would like for Dr. Wynetta Emery to send a referral for him to see urologist about this. If there are any question please call patient back, thanks.

## 2017-10-11 NOTE — Telephone Encounter (Signed)
Referral generated

## 2017-10-12 DIAGNOSIS — E119 Type 2 diabetes mellitus without complications: Secondary | ICD-10-CM | POA: Diagnosis not present

## 2017-11-05 DIAGNOSIS — G4733 Obstructive sleep apnea (adult) (pediatric): Secondary | ICD-10-CM | POA: Diagnosis not present

## 2017-11-07 DIAGNOSIS — M1611 Unilateral primary osteoarthritis, right hip: Secondary | ICD-10-CM | POA: Diagnosis not present

## 2017-11-14 ENCOUNTER — Encounter: Payer: Self-pay | Admitting: Urology

## 2017-11-14 ENCOUNTER — Encounter
Admission: RE | Admit: 2017-11-14 | Discharge: 2017-11-14 | Disposition: A | Discharge status: Home / Self Care | Payer: Medicare HMO | Source: Ambulatory Visit | Attending: Orthopedic Surgery | Admitting: Orthopedic Surgery

## 2017-11-14 ENCOUNTER — Ambulatory Visit: Payer: Medicare HMO | Admitting: Urology

## 2017-11-14 ENCOUNTER — Other Ambulatory Visit: Payer: Self-pay

## 2017-11-14 VITALS — BP 115/68 | HR 64 | Ht 74.0 in | Wt 315.8 lb

## 2017-11-14 DIAGNOSIS — Z01812 Encounter for preprocedural laboratory examination: Secondary | ICD-10-CM | POA: Insufficient documentation

## 2017-11-14 DIAGNOSIS — Z8546 Personal history of malignant neoplasm of prostate: Secondary | ICD-10-CM | POA: Diagnosis not present

## 2017-11-14 DIAGNOSIS — E119 Type 2 diabetes mellitus without complications: Secondary | ICD-10-CM | POA: Diagnosis not present

## 2017-11-14 DIAGNOSIS — R310 Gross hematuria: Secondary | ICD-10-CM

## 2017-11-14 DIAGNOSIS — I10 Essential (primary) hypertension: Secondary | ICD-10-CM | POA: Diagnosis not present

## 2017-11-14 DIAGNOSIS — N3001 Acute cystitis with hematuria: Secondary | ICD-10-CM | POA: Diagnosis not present

## 2017-11-14 DIAGNOSIS — Z01810 Encounter for preprocedural cardiovascular examination: Secondary | ICD-10-CM | POA: Diagnosis not present

## 2017-11-14 LAB — CBC
HEMATOCRIT: 42.6 % (ref 40.0–52.0)
HEMOGLOBIN: 14.2 g/dL (ref 13.0–18.0)
MCH: 27.8 pg (ref 26.0–34.0)
MCHC: 33.4 g/dL (ref 32.0–36.0)
MCV: 83.3 fL (ref 80.0–100.0)
Platelets: 268 10*3/uL (ref 150–440)
RBC: 5.11 MIL/uL (ref 4.40–5.90)
RDW: 15.9 % — ABNORMAL HIGH (ref 11.5–14.5)
WBC: 9.5 10*3/uL (ref 3.8–10.6)

## 2017-11-14 LAB — URINALYSIS, ROUTINE W REFLEX MICROSCOPIC
BILIRUBIN URINE: NEGATIVE
Bacteria, UA: NONE SEEN
GLUCOSE, UA: NEGATIVE mg/dL
KETONES UR: NEGATIVE mg/dL
Leukocytes, UA: NEGATIVE
NITRITE: NEGATIVE
PH: 6 (ref 5.0–8.0)
Protein, ur: NEGATIVE mg/dL
SPECIFIC GRAVITY, URINE: 1.016 (ref 1.005–1.030)
Squamous Epithelial / LPF: NONE SEEN (ref 0–5)

## 2017-11-14 LAB — BASIC METABOLIC PANEL
Anion gap: 11 (ref 5–15)
BUN: 16 mg/dL (ref 8–23)
CHLORIDE: 102 mmol/L (ref 98–111)
CO2: 27 mmol/L (ref 22–32)
CREATININE: 1.13 mg/dL (ref 0.61–1.24)
Calcium: 9.2 mg/dL (ref 8.9–10.3)
GFR calc Af Amer: >60 mL/min (ref >60)
GFR calc non Af Amer: >60 mL/min (ref >60)
Glucose, Bld: 137 mg/dL — ABNORMAL HIGH (ref 70–99)
POTASSIUM: 3.3 mmol/L — AB (ref 3.5–5.1)
Sodium: 140 mmol/L (ref 135–145)

## 2017-11-14 LAB — TYPE AND SCREEN
ABO/RH(D): O POS
Antibody Screen: NEGATIVE

## 2017-11-14 LAB — APTT: aPTT: 38 seconds — ABNORMAL HIGH (ref 24–36)

## 2017-11-14 LAB — SURGICAL PCR SCREEN
MRSA, PCR: NEGATIVE
STAPHYLOCOCCUS AUREUS: NEGATIVE

## 2017-11-14 LAB — SEDIMENTATION RATE: Sed Rate: 43 mm/hr — ABNORMAL HIGH (ref 0–20)

## 2017-11-14 LAB — PROTIME-INR
INR: 1.06
Prothrombin Time: 13.7 seconds (ref 11.4–15.2)

## 2017-11-14 NOTE — Progress Notes (Signed)
11/14/2017 1:42 PM   DEVAUN HERNANDEZ 1950-12-08 [id number]  Referring provider: Valerie Roys, DO [place], [place] [address]  Chief Complaint  Patient presents with  . Hematuria    HPI: Tremont Gavitt is a 67 year old male seen at the request of Dr. Wynetta Emery for evaluation of UTI and gross hematuria.  He was seen on 09/06/2017 with a 5-day history of urinary frequency, urgency and dysuria. His urinalysis showed 11-30 RBC and greater than 30  WBCs.  A urine culture was positive for E. coli.  He was treated with Cipro with resolution of his symptoms.  He developed gross hematuria on 09/27/2017 and urinalysis at that time showed greater than 30 RBCs.  A urine culture was not run.  He continues to have intermittent gross hematuria but remains free of dysuria.  He does have baseline frequency and urgency.  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  I saw him in August 2016 for total gross painless hematuria.  A CT urogram showed no significant upper tract abnormalities.  There was a punctate intraparenchymal calcification in the right kidney.  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  His gross hematuria subsequently resolved until now.  His last PSA was in March 2019 and was undetectable at <0.1.    PMH: Past Medical History:  Diagnosis Date  . Cancer [facility]) 2013   prostate (radiation and surgery)  . Diabetes mellitus without complication ([place])   . Gout   . Hyperlipidemia   . Hypertension   . Sleep apnea     Surgical History: Past Surgical History:  Procedure Laterality Date  . CIRCUMCISION    . CYST EXCISION Left 2015   arm  . deviated septrum  2003  . JOINT REPLACEMENT Left 2016   Hip  . PROSTATE SURGERY     prostatectomy  due to cancer  . PROSTATECTOMY  2013  . TONSILLECTOMY AND ADENOIDECTOMY  2004  . TOTAL HIP ARTHROPLASTY Left 2016    Home Medications:  Allergies as of 11/14/2017      Reactions   Glucophage [metformin] Diarrhea      Medication List        Accurate as of 11/14/17  1:42 PM. Always use your most recent med list.          allopurinol 300 MG tablet Commonly known as:  ZYLOPRIM Take 1 tablet (300 mg total) by mouth daily.   AMBULATORY NON FORMULARY MEDICATION Medication Name: CPAP Equipment, including new machine, mask, and tubing   amLODipine 10 MG tablet Commonly known as:  NORVASC Take 1 tablet (10 mg total) by mouth daily.   atorvastatin 20 MG tablet Commonly known as:  LIPITOR Take 1 tablet (20 mg total) by mouth daily.   BD INSULIN SYRINGE ULTRAFINE 31G X 15/64" 1 ML Misc Generic drug:  Insulin Syringe-Needle U-100 USE AS DIRECTED   carvedilol 25 MG tablet Commonly known as:  COREG Take 1 tablet (25 mg total) by mouth 2 (two) times daily with a meal.   hydrochlorothiazide 25 MG tablet Commonly known as:  HYDRODIURIL Take 1 tablet (25 mg total) by mouth daily.   insulin pump Soln Inject into the skin. HUMALOG 100 UNIT/ML injection  USE UP TO 300 UNITS D VIA INSULIN PUMP   losartan 100 MG tablet  Commonly known as:  COZAAR Take 1 tablet (100 mg total) by mouth daily.       Allergies:  Allergies  Allergen Reactions  . Glucophage [Metformin] Diarrhea    Family History: Family History  Problem Relation Age of Onset  . Cancer Mother   . Breast cancer Mother   . Stroke Father   . Cancer Sister   . Cancer Maternal Uncle   . Stroke Maternal Uncle     Social History:  reports that he has never smoked. He has never used smokeless tobacco. He reports that he does not drink alcohol or use drugs.  ROS: UROLOGY Frequent Urination?: Yes Hard to postpone urination?: Yes Burning/pain with urination?: No Get up at night to urinate?: Yes Leakage of urine?:  No Urine stream starts and stops?: No Trouble starting stream?: No Do you have to strain to urinate?: No Blood in urine?: Yes Urinary tract infection?: Yes Sexually transmitted disease?: No Injury to kidneys or bladder?: No Painful intercourse?: No Weak stream?: No Erection problems?: Yes Penile pain?: No  Gastrointestinal Nausea?: No Vomiting?: No Indigestion/heartburn?: No Diarrhea?: No Constipation?: No  Constitutional Fever: No Night sweats?: No Weight loss?: No Fatigue?: No  Skin Skin rash/lesions?: No Itching?: No  Eyes Blurred vision?: No Double vision?: No  Ears/Nose/Throat Sore throat?: No Sinus problems?: No  Hematologic/Lymphatic Swollen glands?: No Easy bruising?: No  Cardiovascular Leg swelling?: No Chest pain?: No  Respiratory Cough?: No Shortness of breath?: No  Endocrine Excessive thirst?: No  Musculoskeletal Back pain?: No Joint pain?: Yes  Neurological Headaches?: No Dizziness?: No  Psychologic Depression?: No Anxiety?: No  Physical Exam: BP 115/68 (BP Location: Left Arm, Patient Position: Sitting, Cuff Size: Large)   Pulse 64   Ht 6\' 2"  (1.88 m)   Wt (!) 315 lb 12.8 oz (143.2 kg)   BMI 40.55 kg/m    Constitutional:  Alert and oriented, No acute distress. HEENT: [place] AT, moist mucus membranes.  Trachea midline, no masses. Cardiovascular: No clubbing, cyanosis, or edema. Respiratory: Normal respiratory effort, no increased work of breathing. GI: Abdomen is soft, nontender, nondistended, no abdominal masses GU: No CVA tenderness Lymph: No cervical or inguinal lymphadenopathy. Skin: No rashes, bruises or suspicious lesions. Neurologic: Grossly intact, no focal deficits, moving all 4 extremities. Psychiatric: Normal mood and affect.  Laboratory Data:  Urinalysis Dipstick 3+ blood; microscopy > 30 RBC.   Assessment & Plan:   67 year old male with intermittent gross hematuria and microhematuria.  He had a positive  urine culture in April however was symptomatic and had primarily pyuria.  A culture was repeated today however I suspect it will be negative.  Since it is been 3 years since his last hematuria evaluation I recommended restudying with CT urogram and cystoscopy.  He desires to schedule.   Abbie Sons, [facility] [address] [place], [place] [address] [phone]

## 2017-11-14 NOTE — Patient Instructions (Addendum)
Your procedure is scheduled on: Tuesday, November 27, 2017 Report to Day Surgery on the 2nd floor of the [facility]. To find out your arrival time, please call [phone] between 1PM - 3PM on: Monday, November 26, 2017  REMEMBER: Instructions that are not followed completely may result in serious medical risk, up to and including death; or upon the discretion of your surgeon and anesthesiologist your surgery may need to be rescheduled.  Do not eat food after midnight the night before your procedure.  No gum chewing, lozengers or hard candies.  You may however, drink water up to 2 hours before you are scheduled to arrive for your surgery. Do not drink anything within 2 hours of the start of your surgery.  No Alcohol for 24 hours before or after surgery.  No Smoking including e-cigarettes for 24 hours prior to surgery.  No chewable tobacco products for at least 6 hours prior to surgery.  No nicotine patches on the day of surgery.  On the morning of surgery brush your teeth with toothpaste and water, you may rinse your mouth with mouthwash if you wish. Do not swallow any toothpaste or mouthwash.  Notify your doctor if there is any change in your medical condition (cold, fever, infection).  Do not wear jewelry, make-up, hairpins, clips or nail polish.  Do not wear lotions, powders, or perfumes. You may wear deodorant.  Do not shave 48 hours prior to surgery. Men may shave face and neck.  Contacts and dentures may not be worn into surgery.  Do not bring valuables to the hospital, including drivers license, insurance or credit cards.  [facility] is not responsible for any belongings or valuables.   TAKE THESE MEDICATIONS THE MORNING OF SURGERY:  1. CARVEDILOL  Use CHG Soap as directed on instruction sheet.  Bring your C-PAP to the hospital with you.   Leave your insulin pump on as usual until you arrive to the hospital and are told to remove it. Your capillary blood sugar will be  checked when you arrive to the hospital.  On July 2 - Stop Anti-inflammatories (NSAIDS) such as Advil, Aleve, Ibuprofen, Motrin, Naproxen, Naprosyn and Aspirin based products such as Excedrin, Goodys Powder, BC Powder. (May take Tylenol or Acetaminophen if needed.)  On July 2 - Stop ANY OVER THE COUNTER supplements until after surgery.  Wear comfortable clothing (specific to your surgery type) to the hospital.  Plan for stool softeners for home use.  If you are being admitted to the hospital overnight, leave your suitcase in the car. After surgery it may be brought to your room.  Please call [phone] if you have any questions about these instructions.

## 2017-11-15 LAB — URINALYSIS, COMPLETE
Bilirubin, UA: NEGATIVE
GLUCOSE, UA: NEGATIVE
KETONES UA: NEGATIVE
Leukocytes, UA: NEGATIVE
NITRITE UA: NEGATIVE
PROTEIN UA: NEGATIVE
SPEC GRAV UA: 1.015 (ref 1.005–1.030)
UUROB: 0.2 mg/dL (ref 0.2–1.0)
pH, UA: 6 (ref 5.0–7.5)

## 2017-11-15 LAB — URINE CULTURE: Culture: NO GROWTH

## 2017-11-15 LAB — MICROSCOPIC EXAMINATION
RBC, UA: >30 /hpf — ABNORMAL HIGH (ref 0–2)
WBC UA: NONE SEEN /HPF (ref 0–5)

## 2017-11-19 LAB — CULTURE, URINE COMPREHENSIVE

## 2017-11-20 ENCOUNTER — Telehealth: Payer: Self-pay

## 2017-11-20 NOTE — Telephone Encounter (Signed)
-----   Message from Abbie Sons, MD sent at 11/20/2017 11:18 AM EDT ----- Urine culture was negative for infection.  Follow-up as scheduled

## 2017-11-27 ENCOUNTER — Inpatient Hospital Stay: Payer: Medicare HMO

## 2017-11-27 ENCOUNTER — Other Ambulatory Visit: Payer: Self-pay

## 2017-11-27 ENCOUNTER — Inpatient Hospital Stay
Admission: RE | Admit: 2017-11-27 | Discharge: 2017-11-30 | DRG: 470 | Disposition: A | Discharge status: Home Health | Payer: Medicare HMO | Attending: Orthopedic Surgery | Admitting: Orthopedic Surgery

## 2017-11-27 ENCOUNTER — Inpatient Hospital Stay: Payer: Medicare HMO | Admitting: Anesthesiology

## 2017-11-27 ENCOUNTER — Encounter
Admission: RE | Disposition: A | Discharge status: Home Health | Payer: Self-pay | Source: Home / Self Care | Attending: Orthopedic Surgery

## 2017-11-27 ENCOUNTER — Encounter: Payer: Self-pay | Admitting: *Deleted

## 2017-11-27 DIAGNOSIS — M1611 Unilateral primary osteoarthritis, right hip: Secondary | ICD-10-CM | POA: Diagnosis not present

## 2017-11-27 DIAGNOSIS — G4730 Sleep apnea, unspecified: Secondary | ICD-10-CM | POA: Diagnosis present

## 2017-11-27 DIAGNOSIS — G8918 Other acute postprocedural pain: Secondary | ICD-10-CM

## 2017-11-27 DIAGNOSIS — Z794 Long term (current) use of insulin: Secondary | ICD-10-CM

## 2017-11-27 DIAGNOSIS — M109 Gout, unspecified: Secondary | ICD-10-CM | POA: Diagnosis present

## 2017-11-27 DIAGNOSIS — Z6841 Body Mass Index (BMI) 40.0 and over, adult: Secondary | ICD-10-CM

## 2017-11-27 DIAGNOSIS — I959 Hypotension, unspecified: Secondary | ICD-10-CM | POA: Diagnosis not present

## 2017-11-27 DIAGNOSIS — Z8546 Personal history of malignant neoplasm of prostate: Secondary | ICD-10-CM

## 2017-11-27 DIAGNOSIS — I10 Essential (primary) hypertension: Secondary | ICD-10-CM | POA: Diagnosis present

## 2017-11-27 DIAGNOSIS — Z803 Family history of malignant neoplasm of breast: Secondary | ICD-10-CM

## 2017-11-27 DIAGNOSIS — Z96641 Presence of right artificial hip joint: Secondary | ICD-10-CM | POA: Diagnosis not present

## 2017-11-27 DIAGNOSIS — Z471 Aftercare following joint replacement surgery: Secondary | ICD-10-CM | POA: Diagnosis not present

## 2017-11-27 DIAGNOSIS — Z833 Family history of diabetes mellitus: Secondary | ICD-10-CM | POA: Diagnosis not present

## 2017-11-27 DIAGNOSIS — E119 Type 2 diabetes mellitus without complications: Secondary | ICD-10-CM | POA: Diagnosis not present

## 2017-11-27 DIAGNOSIS — Z419 Encounter for procedure for purposes other than remedying health state, unspecified: Secondary | ICD-10-CM

## 2017-11-27 DIAGNOSIS — Z96642 Presence of left artificial hip joint: Secondary | ICD-10-CM | POA: Diagnosis present

## 2017-11-27 DIAGNOSIS — E785 Hyperlipidemia, unspecified: Secondary | ICD-10-CM | POA: Diagnosis not present

## 2017-11-27 HISTORY — PX: TOTAL HIP ARTHROPLASTY: SHX124

## 2017-11-27 LAB — POCT I-STAT 4, (NA,K, GLUC, HGB,HCT)
Glucose, Bld: 136 mg/dL — ABNORMAL HIGH (ref 70–99)
HCT: 39 % (ref 39.0–52.0)
HEMOGLOBIN: 13.3 g/dL (ref 13.0–17.0)
Potassium: 3.6 mmol/L (ref 3.5–5.1)
SODIUM: 143 mmol/L (ref 135–145)

## 2017-11-27 LAB — GLUCOSE, CAPILLARY
Glucose-Capillary: 133 mg/dL — ABNORMAL HIGH (ref 70–99)
Glucose-Capillary: 122 mg/dL — ABNORMAL HIGH (ref 70–99)
Glucose-Capillary: 140 mg/dL — ABNORMAL HIGH (ref 70–99)
Glucose-Capillary: 111 mg/dL — ABNORMAL HIGH (ref 70–99)
Glucose-Capillary: 91 mg/dL (ref 70–99)

## 2017-11-27 LAB — ABO/RH: ABO/RH(D): O POS

## 2017-11-27 SURGERY — ARTHROPLASTY, HIP, TOTAL, ANTERIOR APPROACH
Anesthesia: Spinal | Site: Hip | Laterality: Right | Wound class: Clean

## 2017-11-27 MED ORDER — INSULIN PUMP
Freq: Three times a day (TID) | SUBCUTANEOUS | Status: DC
  Administered 2017-11-27: 17:00:00 via SUBCUTANEOUS
  Administered 2017-11-27: 1 via SUBCUTANEOUS
  Administered 2017-11-28 – 2017-11-29 (×3): via SUBCUTANEOUS
  Administered 2017-11-29: 9 via SUBCUTANEOUS
  Administered 2017-11-29: 08:00:00 via SUBCUTANEOUS
  Filled 2017-11-27: qty 1

## 2017-11-27 MED ORDER — ACETAMINOPHEN 500 MG PO TABS
1000.0000 mg | ORAL_TABLET | Freq: Four times a day (QID) | ORAL | Status: AC
  Administered 2017-11-27 – 2017-11-28 (×4): 1000 mg via ORAL
  Filled 2017-11-27 (×4): qty 2

## 2017-11-27 MED ORDER — FENTANYL CITRATE (PF) 100 MCG/2ML IJ SOLN
INTRAMUSCULAR | Status: AC
  Filled 2017-11-27: qty 2

## 2017-11-27 MED ORDER — METOCLOPRAMIDE HCL 5 MG/ML IJ SOLN: 5.0000 mg | Freq: Three times a day (TID) | INTRAMUSCULAR | Status: DC | PRN

## 2017-11-27 MED ORDER — SODIUM CHLORIDE 0.9 % IV SOLN
INTRAVENOUS | Status: DC
  Administered 2017-11-27 – 2017-11-28 (×2): via INTRAVENOUS

## 2017-11-27 MED ORDER — ALUM & MAG HYDROXIDE-SIMETH 200-200-20 MG/5ML PO SUSP: 30.0000 mL | ORAL | Status: DC | PRN

## 2017-11-27 MED ORDER — MIDAZOLAM HCL 5 MG/5ML IJ SOLN
INTRAMUSCULAR | Status: DC | PRN
  Administered 2017-11-27: 2 mg via INTRAVENOUS

## 2017-11-27 MED ORDER — FAMOTIDINE 20 MG PO TABS
20.0000 mg | ORAL_TABLET | Freq: Once | ORAL | Status: AC
  Administered 2017-11-27: 20 mg via ORAL

## 2017-11-27 MED ORDER — LACTATED RINGERS IV SOLN
INTRAVENOUS | Status: DC | PRN
  Administered 2017-11-27: 11:00:00 via INTRAVENOUS

## 2017-11-27 MED ORDER — PROPOFOL 500 MG/50ML IV EMUL
INTRAVENOUS | Status: AC
  Filled 2017-11-27: qty 50

## 2017-11-27 MED ORDER — PROPOFOL 500 MG/50ML IV EMUL
INTRAVENOUS | Status: DC | PRN
  Administered 2017-11-27: 100 ug/kg/min via INTRAVENOUS

## 2017-11-27 MED ORDER — HYDROCHLOROTHIAZIDE 25 MG PO TABS
25.0000 mg | ORAL_TABLET | Freq: Every day | ORAL | Status: DC
  Administered 2017-11-27 – 2017-11-29 (×3): 25 mg via ORAL
  Filled 2017-11-27 (×3): qty 1

## 2017-11-27 MED ORDER — OXYCODONE HCL 5 MG PO TABS: 10.0000 mg | ORAL_TABLET | ORAL | Status: DC | PRN

## 2017-11-27 MED ORDER — ONDANSETRON HCL 4 MG/2ML IJ SOLN: 4.0000 mg | Freq: Four times a day (QID) | INTRAMUSCULAR | Status: DC | PRN

## 2017-11-27 MED ORDER — METHOCARBAMOL 500 MG PO TABS
500.0000 mg | ORAL_TABLET | Freq: Four times a day (QID) | ORAL | Status: DC | PRN
  Administered 2017-11-27: 500 mg via ORAL
  Filled 2017-11-27: qty 1

## 2017-11-27 MED ORDER — INSULIN PUMP
Freq: Three times a day (TID) | SUBCUTANEOUS | Status: DC
  Filled 2017-11-27: qty 1

## 2017-11-27 MED ORDER — CARVEDILOL 25 MG PO TABS
25.0000 mg | ORAL_TABLET | Freq: Two times a day (BID) | ORAL | Status: DC
  Administered 2017-11-28 (×2): 25 mg via ORAL
  Filled 2017-11-27 (×3): qty 1

## 2017-11-27 MED ORDER — SEVOFLURANE IN SOLN
RESPIRATORY_TRACT | Status: AC
  Filled 2017-11-27: qty 250

## 2017-11-27 MED ORDER — PHENOL 1.4 % MT LIQD
1.0000 | OROMUCOSAL | Status: DC | PRN
  Filled 2017-11-27: qty 177

## 2017-11-27 MED ORDER — NON FORMULARY: 1.0000 "application " | Freq: Every day | Status: DC

## 2017-11-27 MED ORDER — SODIUM CHLORIDE 0.9 % IV SOLN
INTRAVENOUS | Status: DC
  Administered 2017-11-27: 09:00:00 via INTRAVENOUS

## 2017-11-27 MED ORDER — ONDANSETRON HCL 4 MG PO TABS: 4.0000 mg | ORAL_TABLET | Freq: Four times a day (QID) | ORAL | Status: DC | PRN

## 2017-11-27 MED ORDER — OXYCODONE HCL 5 MG PO TABS: 5.0000 mg | ORAL_TABLET | Freq: Once | ORAL | Status: DC | PRN

## 2017-11-27 MED ORDER — EPHEDRINE SULFATE 50 MG/ML IJ SOLN
INTRAMUSCULAR | Status: DC | PRN
  Administered 2017-11-27 (×2): 10 mg via INTRAVENOUS

## 2017-11-27 MED ORDER — OXYCODONE HCL 5 MG PO TABS
5.0000 mg | ORAL_TABLET | ORAL | Status: DC | PRN
  Administered 2017-11-27: 5 mg via ORAL
  Administered 2017-11-28 (×2): 10 mg via ORAL
  Administered 2017-11-29 (×2): 5 mg via ORAL
  Filled 2017-11-27 (×4): qty 1
  Filled 2017-11-27: qty 2
  Filled 2017-11-27: qty 1

## 2017-11-27 MED ORDER — FENTANYL CITRATE (PF) 100 MCG/2ML IJ SOLN: 25.0000 ug | INTRAMUSCULAR | Status: DC | PRN

## 2017-11-27 MED ORDER — PROPOFOL 10 MG/ML IV BOLUS
INTRAVENOUS | Status: AC
  Filled 2017-11-27: qty 20

## 2017-11-27 MED ORDER — METOCLOPRAMIDE HCL 10 MG PO TABS: 5.0000 mg | ORAL_TABLET | Freq: Three times a day (TID) | ORAL | Status: DC | PRN

## 2017-11-27 MED ORDER — LOSARTAN POTASSIUM 50 MG PO TABS
100.0000 mg | ORAL_TABLET | Freq: Every day | ORAL | Status: DC
  Administered 2017-11-27 – 2017-11-29 (×3): 100 mg via ORAL
  Filled 2017-11-27 (×4): qty 2

## 2017-11-27 MED ORDER — SODIUM CHLORIDE 0.9 % IV SOLN
INTRAVENOUS | Status: DC | PRN
  Administered 2017-11-27: 50 ug/min via INTRAVENOUS

## 2017-11-27 MED ORDER — CEFAZOLIN SODIUM 10 G IJ SOLR
3.0000 g | Freq: Four times a day (QID) | INTRAMUSCULAR | Status: DC
  Administered 2017-11-27 (×2): 3 g via INTRAVENOUS
  Filled 2017-11-27 (×2): qty 3000
  Filled 2017-11-27 (×2): qty 3

## 2017-11-27 MED ORDER — TRANEXAMIC ACID 1000 MG/10ML IV SOLN
1000.0000 mg | INTRAVENOUS | Status: AC
  Administered 2017-11-27: 1000 mg via INTRAVENOUS
  Filled 2017-11-27: qty 1100

## 2017-11-27 MED ORDER — MIDAZOLAM HCL 2 MG/2ML IJ SOLN
INTRAMUSCULAR | Status: AC
  Filled 2017-11-27: qty 2

## 2017-11-27 MED ORDER — FENTANYL CITRATE (PF) 100 MCG/2ML IJ SOLN
INTRAMUSCULAR | Status: DC | PRN
  Administered 2017-11-27: 100 ug via INTRAVENOUS
  Administered 2017-11-27: 50 ug via INTRAVENOUS

## 2017-11-27 MED ORDER — BUPIVACAINE HCL (PF) 0.5 % IJ SOLN
INTRAMUSCULAR | Status: DC | PRN
  Administered 2017-11-27: 3.5 mL

## 2017-11-27 MED ORDER — METHOCARBAMOL 1000 MG/10ML IJ SOLN
500.0000 mg | Freq: Four times a day (QID) | INTRAMUSCULAR | Status: DC | PRN
  Filled 2017-11-27: qty 5

## 2017-11-27 MED ORDER — AMLODIPINE BESYLATE 10 MG PO TABS
10.0000 mg | ORAL_TABLET | Freq: Every evening | ORAL | Status: DC
  Administered 2017-11-28: 10 mg via ORAL
  Filled 2017-11-27 (×2): qty 1

## 2017-11-27 MED ORDER — GLYCOPYRROLATE 0.2 MG/ML IJ SOLN
INTRAMUSCULAR | Status: DC | PRN
  Administered 2017-11-27: 0.2 mg via INTRAVENOUS

## 2017-11-27 MED ORDER — SENNOSIDES-DOCUSATE SODIUM 8.6-50 MG PO TABS: 1.0000 | ORAL_TABLET | Freq: Every evening | ORAL | Status: DC | PRN

## 2017-11-27 MED ORDER — ATORVASTATIN CALCIUM 20 MG PO TABS
20.0000 mg | ORAL_TABLET | Freq: Every evening | ORAL | Status: DC
  Administered 2017-11-27 – 2017-11-29 (×3): 20 mg via ORAL
  Filled 2017-11-27 (×3): qty 1

## 2017-11-27 MED ORDER — ASPIRIN EC 325 MG PO TBEC
325.0000 mg | DELAYED_RELEASE_TABLET | Freq: Every day | ORAL | Status: DC
  Administered 2017-11-28 – 2017-11-30 (×3): 325 mg via ORAL
  Filled 2017-11-27 (×3): qty 1

## 2017-11-27 MED ORDER — TRAMADOL HCL 50 MG PO TABS
50.0000 mg | ORAL_TABLET | Freq: Four times a day (QID) | ORAL | Status: DC
  Administered 2017-11-27 – 2017-11-30 (×12): 50 mg via ORAL
  Filled 2017-11-27 (×12): qty 1

## 2017-11-27 MED ORDER — INSULIN ASPART 100 UNIT/ML ~~LOC~~ SOLN: 0.0000 [IU] | Freq: Three times a day (TID) | SUBCUTANEOUS | Status: DC

## 2017-11-27 MED ORDER — ZOLPIDEM TARTRATE 5 MG PO TABS: 5.0000 mg | ORAL_TABLET | Freq: Every evening | ORAL | Status: DC | PRN

## 2017-11-27 MED ORDER — MENTHOL 3 MG MT LOZG
1.0000 | LOZENGE | OROMUCOSAL | Status: DC | PRN
  Filled 2017-11-27: qty 9

## 2017-11-27 MED ORDER — ACETAMINOPHEN 325 MG PO TABS: 325.0000 mg | ORAL_TABLET | Freq: Four times a day (QID) | ORAL | Status: DC | PRN

## 2017-11-27 MED ORDER — HYDROMORPHONE HCL 1 MG/ML IJ SOLN: 0.5000 mg | INTRAMUSCULAR | Status: DC | PRN

## 2017-11-27 MED ORDER — DIPHENHYDRAMINE HCL 12.5 MG/5ML PO ELIX: 12.5000 mg | ORAL_SOLUTION | ORAL | Status: DC | PRN

## 2017-11-27 MED ORDER — DOCUSATE SODIUM 100 MG PO CAPS
100.0000 mg | ORAL_CAPSULE | Freq: Two times a day (BID) | ORAL | Status: DC
  Administered 2017-11-27 – 2017-11-30 (×6): 100 mg via ORAL
  Filled 2017-11-27 (×6): qty 1

## 2017-11-27 MED ORDER — MAGNESIUM CITRATE PO SOLN
1.0000 | Freq: Once | ORAL | Status: AC | PRN
  Administered 2017-11-28: 1 via ORAL
  Filled 2017-11-27 (×2): qty 296

## 2017-11-27 MED ORDER — ALLOPURINOL 300 MG PO TABS
300.0000 mg | ORAL_TABLET | Freq: Every day | ORAL | Status: DC
  Administered 2017-11-27 – 2017-11-29 (×3): 300 mg via ORAL
  Filled 2017-11-27 (×4): qty 1

## 2017-11-27 MED ORDER — DEXTROSE 5 % IV SOLN
3.0000 g | Freq: Once | INTRAVENOUS | Status: AC
  Administered 2017-11-27: 3 g via INTRAVENOUS
  Filled 2017-11-27: qty 3000

## 2017-11-27 MED ORDER — NEOMYCIN-POLYMYXIN B GU 40-200000 IR SOLN
Status: DC | PRN
  Administered 2017-11-27: 4 mL

## 2017-11-27 MED ORDER — BISACODYL 5 MG PO TBEC: 5.0000 mg | DELAYED_RELEASE_TABLET | Freq: Every day | ORAL | Status: DC | PRN

## 2017-11-27 MED ORDER — OXYCODONE HCL 5 MG/5ML PO SOLN: 5.0000 mg | Freq: Once | ORAL | Status: DC | PRN

## 2017-11-27 MED ORDER — FAMOTIDINE 20 MG PO TABS
ORAL_TABLET | ORAL | Status: AC
  Filled 2017-11-27: qty 1

## 2017-11-27 MED ORDER — BUPIVACAINE-EPINEPHRINE 0.25% -1:200000 IJ SOLN
INTRAMUSCULAR | Status: DC | PRN
  Administered 2017-11-27: 30 mL

## 2017-11-27 MED ORDER — PROPOFOL 10 MG/ML IV BOLUS
INTRAVENOUS | Status: DC | PRN
  Administered 2017-11-27: 30 mg via INTRAVENOUS
  Administered 2017-11-27: 50 mg via INTRAVENOUS

## 2017-11-27 SURGICAL SUPPLY — 59 items
BLADE SAGITTAL AGGR TOOTH XLG (BLADE) ×3 IMPLANT
BNDG COHESIVE 6X5 TAN STRL LF (GAUZE/BANDAGES/DRESSINGS) ×9 IMPLANT
CANISTER SUCT 1200ML W/VALVE (MISCELLANEOUS) ×3 IMPLANT
CHLORAPREP W/TINT 26ML (MISCELLANEOUS) ×3 IMPLANT
DRAPE C-ARM XRAY 36X54 (DRAPES) ×3 IMPLANT
DRAPE INCISE IOBAN 66X60 STRL (DRAPES) IMPLANT
DRAPE POUCH INSTRU U-SHP 10X18 (DRAPES) ×3 IMPLANT
DRAPE SHEET LG 3/4 BI-LAMINATE (DRAPES) ×9 IMPLANT
DRAPE TABLE BACK 80X90 (DRAPES) ×3 IMPLANT
DRESSING SURGICEL FIBRLLR 1X2 (HEMOSTASIS) ×2 IMPLANT
DRSG OPSITE POSTOP 4X8 (GAUZE/BANDAGES/DRESSINGS) IMPLANT
DRSG SURGICEL FIBRILLAR 1X2 (HEMOSTASIS) ×6
ELECT BLADE 6.5 EXT (BLADE) ×3 IMPLANT
ELECT REM PT RETURN 9FT ADLT (ELECTROSURGICAL) ×3
ELECTRODE REM PT RTRN 9FT ADLT (ELECTROSURGICAL) ×1 IMPLANT
GLOVE BIOGEL PI IND STRL 7.0 (GLOVE) ×6 IMPLANT
GLOVE BIOGEL PI IND STRL 9 (GLOVE) ×1 IMPLANT
GLOVE BIOGEL PI INDICATOR 7.0 (GLOVE) ×12
GLOVE BIOGEL PI INDICATOR 9 (GLOVE) ×2
GLOVE SURG SYN 9.0  PF PI (GLOVE) ×4
GLOVE SURG SYN 9.0 PF PI (GLOVE) ×2 IMPLANT
GOWN SRG 2XL LVL 4 RGLN SLV (GOWNS) ×1 IMPLANT
GOWN STRL NON-REIN 2XL LVL4 (GOWNS) ×2
GOWN STRL REUS W/ TWL LRG LVL3 (GOWN DISPOSABLE) ×2 IMPLANT
GOWN STRL REUS W/TWL LRG LVL3 (GOWN DISPOSABLE) ×4
HEAD FEMORAL 28MM SZ S (Head) ×3 IMPLANT
HEMOVAC 400CC 10FR (MISCELLANEOUS) ×3 IMPLANT
HOLDER FOLEY CATH W/STRAP (MISCELLANEOUS) ×3 IMPLANT
HOOD PEEL AWAY FLYTE STAYCOOL (MISCELLANEOUS) ×3 IMPLANT
KIT PREVENA INCISION MGT 13 (CANNISTER) ×3 IMPLANT
LINER DM 28MM (Liner) ×3 IMPLANT
LINER DM SZH 28X56 (Liner) ×1 IMPLANT
MAT BLUE FLOOR 46X72 FLO (MISCELLANEOUS) ×3 IMPLANT
NDL SAFETY ECLIPSE 18X1.5 (NEEDLE) ×1 IMPLANT
NEEDLE HYPO 18GX1.5 SHARP (NEEDLE) ×2
NEEDLE SPNL 18GX3.5 QUINCKE PK (NEEDLE) ×3 IMPLANT
NS IRRIG 1000ML POUR BTL (IV SOLUTION) ×3 IMPLANT
PACK HIP COMPR (MISCELLANEOUS) ×3 IMPLANT
SCALPEL PROTECTED #10 DISP (BLADE) ×6 IMPLANT
SHELL ACETABULAR DM  56MM (Shell) ×3 IMPLANT
SOL PREP PVP 2OZ (MISCELLANEOUS) ×3
SOLUTION PREP PVP 2OZ (MISCELLANEOUS) ×1 IMPLANT
SPONGE DRAIN TRACH 4X4 STRL 2S (GAUZE/BANDAGES/DRESSINGS) ×3 IMPLANT
STAPLER SKIN PROX 35W (STAPLE) ×3 IMPLANT
STEM FEMORAL SZ3  STD COLLARED (Stem) ×3 IMPLANT
STRAP SAFETY 5IN WIDE (MISCELLANEOUS) ×3 IMPLANT
SUT DVC 2 QUILL PDO  T11 36X36 (SUTURE) ×2
SUT DVC 2 QUILL PDO T11 36X36 (SUTURE) ×1 IMPLANT
SUT SILK 0 (SUTURE) ×2
SUT SILK 0 30XBRD TIE 6 (SUTURE) ×1 IMPLANT
SUT V-LOC 90 ABS DVC 3-0 CL (SUTURE) ×3 IMPLANT
SUT VIC AB 1 CT1 36 (SUTURE) ×3 IMPLANT
SYR 20CC LL (SYRINGE) ×3 IMPLANT
SYR 30ML LL (SYRINGE) ×3 IMPLANT
SYR BULB IRRIG 60ML STRL (SYRINGE) ×3 IMPLANT
TAPE MICROFOAM 4IN (TAPE) ×3 IMPLANT
TOWEL OR 17X26 4PK STRL BLUE (TOWEL DISPOSABLE) IMPLANT
TRAY FOLEY MTR SLVR 16FR STAT (SET/KITS/TRAYS/PACK) ×3 IMPLANT
WND VAC CANISTER 500ML (MISCELLANEOUS) ×3 IMPLANT

## 2017-11-27 NOTE — Anesthesia Preprocedure Evaluation (Signed)
Anesthesia Evaluation  Patient identified by MRN, date of birth, ID band Patient awake    Reviewed: Allergy & Precautions, H&P , NPO status , Patient's Chart, lab work & pertinent test results  History of Anesthesia Complications Negative for: history of anesthetic complications  Airway Mallampati: III  TM Distance: <3 FB Neck ROM: limited    Dental  (+) Chipped, Poor Dentition   Pulmonary neg shortness of breath, sleep apnea and Continuous Positive Airway Pressure Ventilation ,           Cardiovascular Exercise Tolerance: Good hypertension, (-) angina(-) Past MI and (-) DOE      Neuro/Psych negative neurological ROS  negative psych ROS   GI/Hepatic negative GI ROS, Neg liver ROS, neg GERD  ,  Endo/Other  diabetes, Type 2, Insulin DependentInsulin pump  Renal/GU      Musculoskeletal  (+) Arthritis ,   Abdominal   Peds  Hematology negative hematology ROS (+)   Anesthesia Other Findings Past Medical History: 2013: Cancer ([place])     Comment:  prostate (radiation and surgery) No date: Diabetes mellitus without complication ([place]) No date: Gout No date: Hyperlipidemia No date: Hypertension No date: Sleep apnea  Past Surgical History: No date: CIRCUMCISION 2015: CYST EXCISION; Left     Comment:  arm 2003: deviated septrum 2016: JOINT REPLACEMENT; Left     Comment:  Hip No date: PROSTATE SURGERY     Comment:  prostatectomy due to cancer 2013: PROSTATECTOMY 2004: TONSILLECTOMY AND ADENOIDECTOMY 2016: TOTAL HIP ARTHROPLASTY; Left  BMI    Body Mass Index:  40.55 kg/m      Reproductive/Obstetrics negative OB ROS                             Anesthesia Physical Anesthesia Plan  ASA: III  Anesthesia Plan: Spinal   Post-op Pain Management:    Induction:   PONV Risk Score and Plan:   Airway Management Planned: Natural Airway and Nasal Cannula  Additional Equipment:    Intra-op Plan:   Post-operative Plan:   Informed Consent: I have reviewed the patients History and Physical, chart, labs and discussed the procedure including the risks, benefits and alternatives for the proposed anesthesia with the patient or authorized representative who has indicated his/her understanding and acceptance.   Dental Advisory Given  Plan Discussed with: Anesthesiologist, CRNA and Surgeon  Anesthesia Plan Comments: (Patient reports no bleeding problems and no anticoagulant use.  Plan for spinal with backup GA  Patient consented for risks of anesthesia including but not limited to:  - adverse reactions to medications - risk of bleeding, infection, nerve damage and headache - risk of failed spinal - damage to teeth, lips or other oral mucosa - sore throat or hoarseness - Damage to heart, brain, lungs or loss of life  Patient voiced understanding.)        Anesthesia Quick Evaluation

## 2017-11-27 NOTE — NC FL2 (Signed)
[place] LEVEL OF CARE SCREENING TOOL     IDENTIFICATION  Patient Name: Aaron [name] Birthdate: Jan 12, 1951 Sex: male Admission Date (Current Location): 11/27/2017  [place] and [place] Number:  Engineering geologist and Address:  [facility], [address] [place], [place] [address]      Provider Number: [id number]  Attending Physician Name and Address:  Hessie Knows, MD  Relative Name and Phone Number:       Current Level of Care: Hospital Recommended Level of Care: [place] Prior Approval Number:    Date Approved/Denied:   PASRR Number: [id number])  Discharge Plan: SNF    Current Diagnoses: Patient Active Problem List   Diagnosis Date Noted  . Status post total hip replacement, right 11/27/2017  . Personal history of prostate cancer 11/14/2017  . Advanced care planning/counseling discussion 08/15/2017  . Degenerative arthritis of cervical spine 02/15/2017  . Sleep apnea 04/26/2015  . Gout 04/26/2015  . Prostate cancer ([place]) 04/26/2015  . 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 BMI 40.0-44.9, adult ([place]) 04/26/2015  . Hematuria, gross 12/21/2014  . Diabetes mellitus without complication ([place])   . Hyperlipidemia   . Hypertension     Orientation RESPIRATION BLADDER Height & Weight     Self, Time, Situation, Place  Normal Continent Weight: (!) 315 lb 12.8 oz (143.2 kg) Height:  6\' 2"  (188 cm)  BEHAVIORAL SYMPTOMS/MOOD NEUROLOGICAL BOWEL NUTRITION STATUS      Continent Diet(Diet: Carb Modified. )  AMBULATORY STATUS COMMUNICATION OF NEEDS Skin   Extensive Assist Verbally Surgical wounds, Wound Vac(Incision: Right Hip, provena wound vac. )                       Personal Care Assistance Level of Assistance  Bathing, Feeding, Dressing Bathing Assistance: Limited assistance Feeding assistance: Independent Dressing Assistance: Limited assistance     Functional Limitations Info  Sight, Hearing, Speech  Sight Info: Adequate Hearing Info: Adequate Speech Info: Adequate    SPECIAL CARE FACTORS FREQUENCY  PT (By licensed PT), OT (By licensed OT)     PT Frequency: (5) OT Frequency: (5)            Contractures      Additional Factors Info  Code Status, Allergies Code Status Info: (Full Code. ) Allergies Info: (Glucophage Metformin)           Current Medications (11/27/2017):  This is the current hospital active medication list Current Facility-Administered Medications  Medication Dose Route Frequency Provider Last Rate Last Dose  . 0.9 %  sodium chloride infusion   Intravenous Continuous Hessie Knows, MD 100 mL/hr at 11/27/17 1335    . acetaminophen (TYLENOL) tablet 1,000 mg  1,000 mg Oral Q6H Hessie Knows, MD      . Derrill Memo ON 11/28/2017] acetaminophen (TYLENOL) tablet 325-650 mg  325-650 mg Oral Q6H PRN Hessie Knows, MD      . allopurinol (ZYLOPRIM) tablet 300 mg  300 mg Oral QHS Hessie Knows, MD      . alum & mag hydroxide-simeth (MAALOX/MYLANTA) 200-200-20 MG/5ML suspension 30 mL  30 mL Oral Q4H PRN Hessie Knows, MD      . amLODipine (NORVASC) tablet 10 mg  10 mg Oral QPM Hessie Knows, MD      . Derrill Memo ON 11/28/2017] aspirin EC tablet 325 mg  325 mg Oral Q breakfast Hessie Knows, MD      . atorvastatin (LIPITOR) tablet 20 mg  20 mg Oral  QPM Hessie Knows, MD      . bisacodyl (DULCOLAX) EC tablet 5 mg  5 mg Oral Daily PRN Hessie Knows, MD      . carvedilol (COREG) tablet 25 mg  25 mg Oral BID WC Hessie Knows, MD      . ceFAZolin (ANCEF) 3 g in dextrose 5 % 50 mL IVPB  3 g Intravenous Q6H Hessie Knows, MD      . diphenhydrAMINE (BENADRYL) 12.5 MG/5ML elixir 12.5-25 mg  12.5-25 mg Oral Q4H PRN Hessie Knows, MD      . docusate sodium (COLACE) capsule 100 mg  100 mg Oral BID Hessie Knows, MD      . famotidine (PEPCID) 20 MG tablet           . hydrochlorothiazide (HYDRODIURIL) tablet 25 mg  25 mg Oral QHS Hessie Knows, MD      . HYDROmorphone (DILAUDID) injection 0.5-1  mg  0.5-1 mg Intravenous Q4H PRN Hessie Knows, MD      . insulin pump   Subcutaneous TID AC, HS, 0200 Hessie Knows, MD      . losartan (COZAAR) tablet 100 mg  100 mg Oral QHS Hessie Knows, MD      . magnesium citrate solution 1 Bottle  1 Bottle Oral Once PRN Hessie Knows, MD      . menthol-cetylpyridinium (CEPACOL) lozenge 3 mg  1 lozenge Oral PRN Hessie Knows, MD       Or  . phenol (CHLORASEPTIC) mouth spray 1 spray  1 spray Mouth/Throat PRN Hessie Knows, MD      . methocarbamol (ROBAXIN) tablet 500 mg  500 mg Oral Q6H PRN Hessie Knows, MD       Or  . methocarbamol (ROBAXIN) 500 mg in dextrose 5 % 50 mL IVPB  500 mg Intravenous Q6H PRN Hessie Knows, MD      . metoCLOPramide (REGLAN) tablet 5-10 mg  5-10 mg Oral Q8H PRN Hessie Knows, MD       Or  . metoCLOPramide (REGLAN) injection 5-10 mg  5-10 mg Intravenous Q8H PRN Hessie Knows, MD      . ondansetron [facility]) tablet 4 mg  4 mg Oral Q6H PRN Hessie Knows, MD       Or  . ondansetron [facility]) injection 4 mg  4 mg Intravenous Q6H PRN Hessie Knows, MD      . oxyCODONE (Oxy IR/ROXICODONE) immediate release tablet 10-15 mg  10-15 mg Oral Q4H PRN Hessie Knows, MD      . oxyCODONE (Oxy IR/ROXICODONE) immediate release tablet 5-10 mg  5-10 mg Oral Q4H PRN Hessie Knows, MD      . senna-docusate (Senokot-S) tablet 1 tablet  1 tablet Oral QHS PRN Hessie Knows, MD      . traMADol [facility]) tablet 50 mg  50 mg Oral Q6H Hessie Knows, MD      . zolpidem (AMBIEN) tablet 5 mg  5 mg Oral QHS PRN Hessie Knows, MD         Discharge Medications: Please see discharge summary for a list of discharge medications.  Relevant Imaging Results:  Relevant Lab Results:   Additional Information (SSN: [id number])  Samyra Limb, Veronia Beets, LCSW

## 2017-11-27 NOTE — Op Note (Signed)
11/27/2017  12:10 PM  PATIENT:  Aaron [name]  67 y.o. male  PRE-OPERATIVE DIAGNOSIS:  primary osteoarthritis of right hip  POST-OPERATIVE DIAGNOSIS:  primary osteoarthritis of right hip  PROCEDURE:  Procedure(s): TOTAL HIP ARTHROPLASTY ANTERIOR APPROACH (Right)  SURGEON: Laurene Footman, MD  ASSISTANTS: None  ANESTHESIA:   spinal  EBL:  Total I/O In: 1000 [I.V.:1000] Out: -   BLOOD ADMINISTERED:none  DRAINS: (To) Hemovact drain(s) in the Subcutaneous layer with  Suction Open   LOCAL MEDICATIONS USED:  MARCAINE     SPECIMEN:  Source of Specimen:  Right femoral head  DISPOSITION OF SPECIMEN:  PATHOLOGY  COUNTS:  YES  TOURNIQUET:  * No tourniquets in log *  IMPLANTS: Medacta 3 AMIS standard stem, 56 mm Mpact DM cup with liner and S 28 mm ceramic head  DICTATION: .Dragon Dictation   The patient was brought to the operating room and after spinal anesthesia was obtained patient was placed on the operative table with the ipsilateral foot into the Medacta attachment, contralateral leg on a well-padded table. C-arm was brought in and preop template x-ray taken. After prepping and draping in usual sterile fashion appropriate patient identification and timeout procedures were completed. Anterior approach to the hip was performed with a bikini type approach and centered over the greater trochanter and TFL muscle. The subcutaneous tissue was incised hemostasis being achieved by electrocautery. TFL fascia was incised and the muscle retracted laterally deep retractor placed. The lateral femoral circumflex vessels were identified and ligated. The anterior capsule was exposed and a capsulotomy performed. The neck was identified and a femoral neck cut carried out with a saw. The head was removed without difficulty and showed sclerotic femoral head and acetabulum. Reaming was carried out to 56 mm and a 56 mm cup trial gave appropriate tightness to the acetabular component a 56 DM cup was  impacted into position. The leg was then externally rotated and ischiofemoral and publofemoral releases carried out. The femur was sequentially broached to a size 3, size 3 standard with S head trials were placed and the final components chosen. The 3 standard stem was inserted along with a ceramic S 28 mm head and 56 mm liner. The hip was reduced and was stable the wound was thoroughly irrigated with fibrillar placed along the posterior capsule and medial neck. The deep fascia was closed using a heavy Quill after infiltration of 30 cc of quarter percent Sensorcaine with epinephrine. Subcutaneous drains were then inserted, 3-0 v-locto close the skin with skin staples and incisional wound VAC applied  PLAN OF CARE: Admit to inpatient

## 2017-11-27 NOTE — H&P (Signed)
Reviewed paper H+P, will be scanned into chart. No changes noted.  

## 2017-11-27 NOTE — Progress Notes (Signed)
Pt has home CPAP plugged into red outlet. No signs of damage

## 2017-11-27 NOTE — Anesthesia Procedure Notes (Signed)
Spinal  Patient location during procedure: OR Start time: 11/27/2017 9:30 AM End time: 11/27/2017 10:00 AM Staffing Anesthesiologist: Piscitello, Precious Haws, MD Resident/CRNA: Nelda Marseille, CRNA Performed: resident/CRNA and anesthesiologist  Preanesthetic Checklist Completed: patient identified, site marked, surgical consent, pre-op evaluation, timeout performed, IV checked, risks and benefits discussed and monitors and equipment checked Spinal Block Patient position: sitting Prep: Betadine Patient monitoring: heart rate, continuous pulse ox, blood pressure and cardiac monitor Approach: midline Location: L4-5 Injection technique: single-shot Needle Needle type: Introducer and Quincke  Needle gauge: 22 G Needle length: 9 cm Additional Notes Negative paresthesia. Negative blood return. Positive free-flowing CSF. Expiration date of kit checked and confirmed. Patient tolerated procedure well, without complications. First attempt per CRNA unsuccessful.  @nd  attempt per MDA successful JP

## 2017-11-27 NOTE — Anesthesia Post-op Follow-up Note (Signed)
Anesthesia QCDR form completed.        

## 2017-11-27 NOTE — Evaluation (Addendum)
Physical Therapy Evaluation Patient Details Name: Aaron [name] MRN: [id number] DOB: [birth year] Today's Date: 11/27/2017   History of Present Illness  PAtient is 67 yo male s/p R THA direct anterior approach with PWB  (50%)  with PMH of HTN, diabetes type II insulin dependent, arthritis, prostate cancer, hyperlipidemia, L THA in 2016.  Clinical Impression  Patient A&Ox4 with pain 6/10 at start of session. Patient reports independence prior to admission, with single point cane use due to R hip pain. The patient was able to complete therapeutic exercises with minimal assistance/verbal cues, bed mobility minAx1, and sit to stand CGA. Patient needed min Ax1 for stand to sit due to low chair. Patient ambulated 2 ft to chair with RW, CGA and cues for PWB precautions. The patient would benefit from further skilled PT to address change from PLOF including decreased mobility, strength, activity tolerance, endurance, gait and balance abnormalities and continued patient/family education.     Follow Up Recommendations Home health PT    Equipment Recommendations  None recommended by PT    Recommendations for Other Services       Precautions / Restrictions Precautions Precautions: Anterior Hip Restrictions Weight Bearing Restrictions: Yes RLE Weight Bearing: Partial weight bearing(50%)      Mobility  Bed Mobility Overal bed mobility: Needs Assistance Bed Mobility: Supine to Sit     Supine to sit: Min assist     General bed mobility comments: min Ax1 for RLE, handheld assist to pull up to sitting fully.   Transfers Overall transfer level: Needs assistance Equipment used: Rolling walker (2 wheeled) Transfers: Sit to/from Stand Sit to Stand: Min guard;Min assist         General transfer comment: min Ax1 for eccentric lowering to chair due to low chair.   Ambulation/Gait Ambulation/Gait assistance: Min guard Gait Distance (Feet): 2 Feet Assistive device: Rolling walker (2  wheeled) Gait Pattern/deviations: Step-to pattern;Decreased stride length     General Gait Details: Patient demonstrated good adherence to [facility] precautions on RLE with verbal cues.   Stairs            Wheelchair Mobility    Modified Rankin (Stroke Patients Only)       Balance Overall balance assessment: Needs assistance Sitting-balance support: Single extremity supported;Feet supported Sitting balance-Leahy Scale: Fair       Standing balance-Leahy Scale: Poor                               Pertinent Vitals/Pain Pain Assessment: 0-10 Pain Score: 6  Pain Location: R hip Pain Descriptors / Indicators: Aching Pain Intervention(s): Limited activity within patient's tolerance;Monitored during session;Repositioned    Home Living Family/patient expects to be discharged to:: Private residence Living Arrangements: Spouse/significant other;Children Available Help at Discharge: Family Type of Home: House Home Access: Stairs to enter Entrance Stairs-Rails: Can reach both Entrance Stairs-Number of Steps: 3 STE Home Layout: One level Home Equipment: Bedside commode;Toilet riser;Walker - 2 wheels;Cane - single point      Prior Function Level of Independence: Independent         Comments: Patient reports that he started using his cane when his hip became really painful a few months ago     Hand Dominance   Dominant Hand: Right    Extremity/Trunk Assessment   Upper Extremity Assessment Upper Extremity Assessment: Overall WFL for tasks assessed    Lower Extremity Assessment Lower Extremity Assessment: RLE deficits/detail;LLE deficits/detail RLE Deficits /  Details: 3/5  RLE: Unable to fully assess due to pain RLE Sensation: WNL LLE Deficits / Details: 4/5 LLE Sensation: WNL       Communication   Communication: No difficulties  Cognition Arousal/Alertness: Awake/alert Behavior During Therapy: WFL for tasks assessed/performed                                           General Comments      Exercises Total Joint Exercises Ankle Circles/Pumps: AROM;Both;20 reps Quad Sets: AROM;Both;20 reps Gluteal Sets: AROM;Both;20 reps Towel Squeeze: AROM;Both;20 reps Hip ABduction/ADduction: AROM;Right;AAROM;10 reps Straight Leg Raises: AAROM;Strengthening;Right;10 reps   Assessment/Plan    PT Assessment Patient needs continued PT services  PT Problem List Pain;Decreased strength;Decreased range of motion;Decreased activity tolerance;Decreased knowledge of use of DME;Decreased balance;Decreased safety awareness;Decreased mobility       PT Treatment Interventions DME instruction;Therapeutic exercise;Gait training;Balance training;Stair training;Neuromuscular re-education;Therapeutic activities;Patient/family education;Functional mobility training    PT Goals (Current goals can be found in the Care Plan section)  Acute Rehab PT Goals Patient Stated Goal: Patient wants to return to PLOF PT Goal Formulation: With patient Time For Goal Achievement: 12/11/17 Potential to Achieve Goals: Good Additional Goals Additional Goal #1: Patient will demonstrate bed mobility mod I to maximize independence and in prep for EOB/OOB activities    Frequency BID   Barriers to discharge        Co-evaluation               AM-PAC PT "6 Clicks" Daily Activity  Outcome Measure Difficulty turning over in bed (including adjusting bedclothes, sheets and blankets)?: Unable Difficulty moving from lying on back to sitting on the side of the bed? : Unable Difficulty sitting down on and standing up from a chair with arms (e.g., wheelchair, bedside commode, etc,.)?: Unable Help needed moving to and from a bed to chair (including a wheelchair)?: A Little Help needed walking in hospital room?: A Little Help needed climbing 3-5 steps with a railing? : A Lot 6 Click Score: 11    End of Session Equipment Utilized During Treatment: Gait  belt Activity Tolerance: Patient limited by pain;Patient limited by fatigue Patient left: in chair;with chair alarm set;with call bell/phone within reach;with SCD's reapplied(heels elevated) Nurse Communication: Mobility status PT Visit Diagnosis: Unsteadiness on feet (R26.81);Other abnormalities of gait and mobility (R26.89);Difficulty in walking, not elsewhere classified (R26.2);Muscle weakness (generalized) (M62.81);Pain Pain - Right/Left: Right Pain - part of body: Hip    Time: 1449-1536 PT Time Calculation (min) (ACUTE ONLY): 47 min   Charges:   PT Evaluation $PT Eval Low Complexity: 1 Low PT Treatments $Therapeutic Exercise: 8-22 mins $Therapeutic Activity: 8-22 mins   PT G Codes:       Lieutenant Diego PT, DPT 3:55 PM,11/27/17 [phone]

## 2017-11-27 NOTE — Transfer of Care (Signed)
Immediate Anesthesia Transfer of Care Note  Patient: Aaron [name]  Procedure(s) Performed: TOTAL HIP ARTHROPLASTY ANTERIOR APPROACH (Right Hip)  Patient Location: PACU  Anesthesia Type:Spinal  Level of Consciousness: awake and sedated  Airway & Oxygen Therapy: Patient Spontanous Breathing and Patient connected to face mask oxygen  Post-op Assessment: Report given to RN and Post -op Vital signs reviewed and stable  Post vital signs: Reviewed and stable  Last Vitals:  Vitals Value Taken Time  BP 110/62 11/27/2017 12:15 PM  Temp    Pulse 59 11/27/2017 12:15 PM  Resp 15 11/27/2017 12:15 PM  SpO2 100 % 11/27/2017 12:15 PM  Vitals shown include unvalidated device data.  Last Pain:  Vitals:   11/27/17 0758  TempSrc: Temporal  PainSc: 5          Complications: No apparent anesthesia complications

## 2017-11-27 NOTE — Progress Notes (Addendum)
Inpatient Diabetes Program Recommendations  AACE/ADA: New Consensus Statement on Inpatient Glycemic Control (2015)  Target Ranges:  Prepandial:   less than 140 mg/dL      Peak postprandial:   less than 180 mg/dL (1-2 hours)      Critically ill patients:  140 - 180 mg/dL   Lab Results  Component Value Date   GLUCAP 140 (H) 11/27/2017    Review of Glycemic ControlResults for Aaron, [name] (MRN [id number]) as of 11/27/2017 14:26  Ref. Range 11/27/2017 08:10 11/27/2017 09:19 11/27/2017 12:17  Glucose-Capillary Latest Ref Range: 70 - 99 mg/dL 133 (H) 122 (H) 140 (H)    Diabetes history: Type 2 DM per MD's notes- 630 G insulin pump  Outpatient Diabetes medications:  Insulin pump (settings per Dr. Gabriel Carina visist on 08/21/17) Basal rates 12 am 3.85 units/hr 4 am 4.0 units/hr 8:30 am 2 units/hr 11 am 2.8 units.hr 2 pm 3.6 units/hr 24-hr basal = 82.8 units Humalog Bolus settings I:C ratios 12 am 1:4, 1 pm 1:3 Sensitivity 10 Target 100-110 Actuve insulin time 4 hrs, max bolus 25 units  Current orders for Inpatient glycemic control:  Insulin pump, Novolog moderate tid with meals and HS  Inpatient Diabetes Program Recommendations:    Call received from RN regarding patient having insulin pump.  Current blood sugars well controlled.  Called and discussed with Dr. Rudene Christians.  Orders received for insulin pump order set and d/c of Novolog moderate correction tid with meals. Will follow.   Thanks,  Adah Perl, RN, BC-ADM Inpatient Diabetes Coordinator Pager [phone] (8a-5p)  15:30 Spoke briefly with patient.  He states that he changed site this morning.  He states that insulin pump settings have not changed since he saw Dr. Gabriel Carina in April, 2019.  He has no needs or questions at this time.  RN has contract for patient to sign and flowsheet for him to fill out.

## 2017-11-27 NOTE — Progress Notes (Signed)
ADMISSION NOTE:  Pt arrived to room 141 from PACU. Pt a&o X4. No complaints of pain. Surgical dressing clean dry and intact. Pt has home insulin pump in LLQ, clean dry intact. Home CPAP at bedside. Family at bedside. Bed in lowest position call bell in reach bed alarm on.

## 2017-11-28 ENCOUNTER — Encounter: Payer: Self-pay | Admitting: Orthopedic Surgery

## 2017-11-28 LAB — BASIC METABOLIC PANEL
ANION GAP: 9 (ref 5–15)
BUN: 15 mg/dL (ref 8–23)
CO2: 27 mmol/L (ref 22–32)
Calcium: 8.2 mg/dL — ABNORMAL LOW (ref 8.9–10.3)
Chloride: 101 mmol/L (ref 98–111)
Creatinine, Ser: 1.01 mg/dL (ref 0.61–1.24)
GFR calc Af Amer: >60 mL/min (ref >60)
GFR calc non Af Amer: >60 mL/min (ref >60)
GLUCOSE: 124 mg/dL — AB (ref 70–99)
POTASSIUM: 3.6 mmol/L (ref 3.5–5.1)
Sodium: 137 mmol/L (ref 135–145)

## 2017-11-28 LAB — CBC
HEMATOCRIT: 34.3 % — AB (ref 40.0–52.0)
Hemoglobin: 11.5 g/dL — ABNORMAL LOW (ref 13.0–18.0)
MCH: 27.9 pg (ref 26.0–34.0)
MCHC: 33.6 g/dL (ref 32.0–36.0)
MCV: 83 fL (ref 80.0–100.0)
Platelets: 239 10*3/uL (ref 150–440)
RBC: 4.13 MIL/uL — AB (ref 4.40–5.90)
RDW: 16 % — ABNORMAL HIGH (ref 11.5–14.5)
WBC: 14.3 10*3/uL — ABNORMAL HIGH (ref 3.8–10.6)

## 2017-11-28 LAB — GLUCOSE, CAPILLARY
GLUCOSE-CAPILLARY: 139 mg/dL — AB (ref 70–99)
GLUCOSE-CAPILLARY: 113 mg/dL — AB (ref 70–99)
GLUCOSE-CAPILLARY: 72 mg/dL (ref 70–99)
Glucose-Capillary: 109 mg/dL — ABNORMAL HIGH (ref 70–99)
Glucose-Capillary: 120 mg/dL — ABNORMAL HIGH (ref 70–99)

## 2017-11-28 NOTE — Progress Notes (Signed)
Physical Therapy Treatment Patient Details Name: Aaron [name] MRN: [id number] DOB: [birth year] Today's Date: 11/28/2017    History of Present Illness Patient is 67 yo male s/p R THA direct anterior approach with PWB  (50%)  with PMH of HTN, diabetes type II insulin dependent, arthritis, prostate cancer, hyperlipidemia, L THA in 2016.    PT Comments    Patient up in chair at start of session agreeable to therapy with R hip pain 5/10, has had pain medications prior to session. Patient needed assistance with R hip abduction/adduction but able to complete other exercises without assistance. Patient transferred and ambulated 41ft with RW, CGA, and occasional cueing PWB precautions. Patient attempted to progress to step through pattern while ambulating for some distance which increased fatigue. The patient would benefit from further skilled PT to continue to address limitations in mobility and return patient to PLOF.     Follow Up Recommendations  Home health PT     Equipment Recommendations  Rolling walker with 5" wheels;Other (comment)(TALL walker)    Recommendations for Other Services       Precautions / Restrictions Precautions Precautions: Anterior Hip;Fall Restrictions Weight Bearing Restrictions: Yes RLE Weight Bearing: Partial weight bearing RLE Partial Weight Bearing Percentage or Pounds: 50%    Mobility  Bed Mobility Overal bed mobility: Needs Assistance Bed Mobility: Sit to Supine       Sit to supine: Min assist   General bed mobility comments: Patient needed assistance for RLE management for sit to supine transfer  Transfers Overall transfer level: Needs assistance Equipment used: Rolling walker (2 wheeled) Transfers: Sit to/from Stand Sit to Stand: Min guard         General transfer comment: Patient able to perform sit <> stand transfer with CGA with elevated bed due to patient's height.   Ambulation/Gait Ambulation/Gait assistance: Min guard Gait  Distance (Feet): 70 Feet Assistive device: Rolling walker (2 wheeled)       General Gait Details: Patient demonstrated improved AD management with less verbal cues needed, increased pace, less rest breaks needed. Patient attempted to progress to step through pattern for some distance.    Stairs             Wheelchair Mobility    Modified Rankin (Stroke Patients Only)       Balance                                  Cognition                                      Exercises Total Joint Exercises Ankle Circles/Pumps: AROM;Both;20 reps Quad Sets: AROM;Both;20 reps Gluteal Sets: AROM;Both;20 reps Towel Squeeze: AROM;Strengthening;Both;20 reps Hip ABduction/ADduction: AROM;Left;20 reps;AAROM;Right Long Arc Quad: AROM;10 reps;Right Seated marching: AROM;10 reps;Right    General Comments        Pertinent Vitals/Pain Pain Assessment: 0-10 Pain Score: 5  Pain Location: R hip Pain Descriptors / Indicators: Sore;Moaning Pain Intervention(s): Repositioned;Premedicated before session    Home Living      Prior Function    PT Goals (current goals can now be found in the care plan section) Acute Rehab PT Goals Patient Stated Goal: Patient wants to return to PLOF Progress towards PT goals: Progressing toward goals    Frequency    BID      PT Plan  Current plan remains appropriate    Co-evaluation              AM-PAC PT "6 Clicks" Daily Activity  Outcome Measure  Difficulty turning over in bed (including adjusting bedclothes, sheets and blankets)?: Unable Difficulty moving from lying on back to sitting on the side of the bed? : Unable Difficulty sitting down on and standing up from a chair with arms (e.g., wheelchair, bedside commode, etc,.)?: Unable Help needed moving to and from a bed to chair (including a wheelchair)?: A Little Help needed walking in hospital room?: A Little Help needed climbing 3-5 steps with a railing? :  A Lot 6 Click Score: 11    End of Session Equipment Utilized During Treatment: Gait belt Activity Tolerance: Patient limited by pain;Patient limited by fatigue Patient left: in bed;with call bell/phone within reach;with bed alarm set;with SCD's reapplied;Other (comment)(heels elevated) Nurse Communication: Mobility status PT Visit Diagnosis: Unsteadiness on feet (R26.81);Other abnormalities of gait and mobility (R26.89);Difficulty in walking, not elsewhere classified (R26.2);Muscle weakness (generalized) (M62.81);Pain Pain - Right/Left: Right Pain - part of body: Hip     Time: 7628-3151 PT Time Calculation (min) (ACUTE ONLY): 38 min  Charges:  $Gait Training: 8-22 mins $Therapeutic Exercise: 8-22 mins $Therapeutic Activity: 8-22 mins                    G Codes:      Lieutenant Diego PT, DPT 2:27 PM,11/28/17 [phone]

## 2017-11-28 NOTE — Progress Notes (Signed)
Physical Therapy Treatment Patient Details Name: Aaron [name] MRN: [id number] DOB: 11-04-1950 Today's Date: 11/28/2017    History of Present Illness PAtient is 67 yo male s/p R THA direct anterior approach with PWB  (50%)  with PMH of HTN, diabetes type II insulin dependent, arthritis, prostate cancer, hyperlipidemia, L THA in 2016.    PT Comments    Patient alert in bed agreeable to therapy at start of session, pain 5/10 in R hip. Patient able to complete bed level exercises AROM/AAROM: AAROM for SLR, hip abduction, heel slides. Patient demonstrated improved bed mobility with min Ax1 for RLE only, extended time given to promote independence. Patient ambulated 18ft in room with RW, CGA cues for pacing and AD management. Stand to sit transfer patient mod Ax1 due to poor eccentric control/low chair. Patient in chair at end of session with all needs in reach.     Follow Up Recommendations  Home health PT     Equipment Recommendations  Rolling walker with 5" wheels;Other (comment)(TALL walker)    Recommendations for Other Services       Precautions / Restrictions Precautions Precautions: Anterior Hip Restrictions Weight Bearing Restrictions: Yes RLE Weight Bearing: Partial weight bearing RLE Partial Weight Bearing Percentage or Pounds: 50%    Mobility  Bed Mobility Overal bed mobility: Needs Assistance Bed Mobility: Supine to Sit     Supine to sit: Min assist     General bed mobility comments: min Ax1 for RLE management, verbal/visual cues for hand placement/technique, extended time needed to increase independence  Transfers Overall transfer level: Needs assistance Equipment used: Rolling walker (2 wheeled)(TALL walker) Transfers: Sit to/from Stand Sit to Stand: Min guard            Ambulation/Gait Ambulation/Gait assistance: Min guard Gait Distance (Feet): 30 Feet Assistive device: Rolling walker (2 wheeled) Gait Pattern/deviations: Step-to pattern;Decreased  stride length     General Gait Details: Patient demonstrated good adherence to [facility] precautions on RLE with verbal cues. Slow pace, needed cues for AD management   Stairs             Wheelchair Mobility    Modified Rankin (Stroke Patients Only)       Balance                                            Cognition                                              Exercises Total Joint Exercises Ankle Circles/Pumps: AROM;Both;20 reps Quad Sets: AROM;Both;20 reps Gluteal Sets: AROM;Both;20 reps Short Arc Quad: AROM;Right;20 reps Heel Slides: AAROM;Right;15 reps Hip ABduction/ADduction: AAROM;Strengthening;Right;20 reps Straight Leg Raises: AAROM;Right;20 reps    General Comments        Pertinent Vitals/Pain Pain Assessment: 0-10 Pain Score: 5  Pain Descriptors / Indicators: Sore Pain Intervention(s): Limited activity within patient's tolerance;Monitored during session;Premedicated before session;Repositioned    Home Living                      Prior Function            PT Goals (current goals can now be found in the care plan section) Acute Rehab PT Goals Patient Stated Goal: Patient  wants to return to PLOF PT Goal Formulation: With patient Time For Goal Achievement: 12/11/17 Potential to Achieve Goals: Good Progress towards PT goals: Progressing toward goals    Frequency    BID      PT Plan      Co-evaluation              AM-PAC PT "6 Clicks" Daily Activity  Outcome Measure  Difficulty turning over in bed (including adjusting bedclothes, sheets and blankets)?: Unable Difficulty moving from lying on back to sitting on the side of the bed? : Unable Difficulty sitting down on and standing up from a chair with arms (e.g., wheelchair, bedside commode, etc,.)?: Unable Help needed moving to and from a bed to chair (including a wheelchair)?: A Little Help needed walking in hospital room?: A Little Help  needed climbing 3-5 steps with a railing? : A Lot 6 Click Score: 11    End of Session Equipment Utilized During Treatment: Gait belt Activity Tolerance: Patient limited by pain;Patient limited by fatigue Patient left: in chair;with chair alarm set;with call bell/phone within reach;with SCD's reapplied(heels elevated) Nurse Communication: Mobility status PT Visit Diagnosis: Unsteadiness on feet (R26.81);Other abnormalities of gait and mobility (R26.89);Difficulty in walking, not elsewhere classified (R26.2);Muscle weakness (generalized) (M62.81);Pain Pain - Right/Left: Right Pain - part of body: Hip     Time: 0910-0956 PT Time Calculation (min) (ACUTE ONLY): 46 min  Charges:  $Gait Training: 8-22 mins $Therapeutic Exercise: 8-22 mins $Therapeutic Activity: 8-22 mins                    G Codes:       Lieutenant Diego PT, DPT 10:13 AM,11/28/17 [phone]

## 2017-11-28 NOTE — Progress Notes (Signed)
   Subjective: 1 Day Post-Op Procedure(s) (LRB): TOTAL HIP ARTHROPLASTY ANTERIOR APPROACH (Right) Patient reports pain as 5 on 0-10 scale.   Patient is well, and has had no acute complaints or problems Denies any CP, SOB, ABD pain. We will continue therapy today.  Plan is to go Home after hospital stay.  Objective: Vital signs in last 24 hours: Temp:  [97.5 F (36.4 C)-99.4 F (37.4 C)] 99.4 F (37.4 C) (07/10 0757) Pulse Rate:  [51-80] 61 (07/10 0757) Resp:  [11-19] 18 (07/10 0757) BP: (74-146)/(47-86) 128/61 (07/10 0757) SpO2:  [98 %-100 %] 98 % (07/10 0757)  Intake/Output from previous day: 07/09 0701 - 07/10 0700 In: 3614.3 [I.V.:3564.3; IV Piggyback:50] Out: 3888 [Urine:3200; Drains:70; Blood:500] Intake/Output this shift: No intake/output data recorded.  Recent Labs    11/27/17 0819 11/28/17 0449  HGB 13.3 11.5*   Recent Labs    11/27/17 0819 11/28/17 0449  WBC  --  14.3*  RBC  --  4.13*  HCT 39.0 34.3*  PLT  --  239   Recent Labs    11/27/17 0819 11/28/17 0449  NA 143 137  K 3.6 3.6  CL  --  101  CO2  --  27  BUN  --  15  CREATININE  --  1.01  GLUCOSE 136* 124*  CALCIUM  --  8.2*   No results for input(s): LABPT, INR in the last 72 hours.  EXAM General - Patient is Alert, Appropriate and Oriented Extremity - Sensation intact distally Intact pulses distally Dorsiflexion/Plantar flexion intact No cellulitis present Compartment soft Dressing - dressing C/D/I and no drainage, hemovac and wound vac intact Motor Function - intact, moving foot and toes well on exam.   Past Medical History:  Diagnosis Date  . Cancer [facility]) 2013   prostate (radiation and surgery)  . Diabetes mellitus without complication ([place])   . Gout   . Hyperlipidemia   . Hypertension   . Sleep apnea     Assessment/Plan:   1 Day Post-Op Procedure(s) (LRB): TOTAL HIP ARTHROPLASTY ANTERIOR APPROACH (Right) Active Problems:   Status post total hip replacement,  right  Estimated body mass index is 40.55 kg/m as calculated from the following:   Height as of this encounter: 6\' 2"  (1.88 m).   Weight as of this encounter: 143.2 kg (315 lb 12.8 oz). Advance diet Up with therapy  Needs BM Recheck labs in the am Pain controlled VSS CM to assist with discharge  DVT Prophylaxis - Aspirin, TED hose and SCD Weight-Bearing as tolerated to right leg   T. Rachelle Hora, PA-C [place] 11/28/2017, 8:00 AM

## 2017-11-28 NOTE — Evaluation (Signed)
Occupational Therapy Evaluation Patient Details Name: Aaron [name] MRN: [id number] DOB: 1951/05/11 Today's Date: 11/28/2017    History of Present Illness PAtient is 67 yo male s/p R THA direct anterior approach with PWB  (50%)  with PMH of HTN, diabetes type II insulin dependent, arthritis, prostate cancer, hyperlipidemia, L THA in 2016.   Clinical Impression   Pt seen for OT evaluation this date, POD#1 from above surgery. Pt was independent in all ADLs prior to surgery, living in single story home with significant other, however occasionally using SPC in recent months for functional mobility due to hip pain. Pt is eager to return to PLOF with less pain and improved safety and independence. Pt currently requires minimal assist for LB dressing while in seated position with use of AE (sock aid/reacher) due to pain and limited AROM of R hip. Pt able to recall PWB (50%) precautions at start of session and with education provided by OT-demonstrates understanding of implementation with sit to stand for functional ADL transfer training and practice. Because pt is familiar with procedures following THA, has knowledge of use of AE and demonstrates correct use on eval, and has assistance available around the clock at home, it does not appear OT follow up is necessary in acute setting at this time and no HHOT needs anticipated.    Follow Up Recommendations  No OT follow up    Equipment Recommendations       Recommendations for Other Services       Precautions / Restrictions Precautions Precautions: Anterior Hip;Fall Restrictions Weight Bearing Restrictions: Yes RLE Weight Bearing: Partial weight bearing RLE Partial Weight Bearing Percentage or Pounds: 50%      Mobility Bed Mobility Overal bed mobility: Needs Assistance Bed Mobility: Supine to Sit     Supine to sit: Min assist     General bed mobility comments: min Ax1 for RLE management, verbal/visual cues for hand  placement/technique, extended time needed to increase independence  Transfers Overall transfer level: Needs assistance Equipment used: Rolling walker (2 wheeled) Transfers: Sit to/from Stand Sit to Stand: Mod assist         General transfer comment: MOD A required to stand from recliner, pt had been sitting up for >1hr since PT as well and chair is low.     Balance Overall balance assessment: Needs assistance         Standing balance support: Bilateral upper extremity supported Standing balance-Leahy Scale: Poor Standing balance comment: Pt standing with FWW in front of recliner with min A for stabilization.                            ADL either performed or assessed with clinical judgement   ADL Overall ADL's : Needs assistance/impaired Eating/Feeding: Independent   Grooming: Independent       Lower Body Bathing: Minimal assistance Lower Body Bathing Details (indicate cue type and reason): secondary to pain, aware of use and demonstrates simulated use of AE for bathing     Lower Body Dressing: Minimal assistance Lower Body Dressing Details (indicate cue type and reason): with AE secondary to guarding/limiting d/t pain in R hip                     Vision Baseline Vision/History: Wears glasses Patient Visual Report: No change from baseline       Perception     Praxis      Pertinent Vitals/Pain Pain Assessment:  0-10 Pain Score: 5  Pain Location: R hip Pain Descriptors / Indicators: Aching Pain Intervention(s): Limited activity within patient's tolerance;Monitored during session     Hand Dominance Right   Extremity/Trunk Assessment Upper Extremity Assessment Upper Extremity Assessment: Overall WFL for tasks assessed;RUE deficits/detail;LUE deficits/detail RUE Deficits / Details: 4/5 MMT shoulder/elbow LUE Deficits / Details: 4/5 MMT shoulder/elbow   Lower Extremity Assessment Lower Extremity Assessment: Defer to PT evaluation RLE  Deficits / Details: noted guarding/limiting due to pain, but grossly 3/5       Communication Communication Communication: No difficulties   Cognition Arousal/Alertness: Awake/alert Behavior During Therapy: WFL for tasks assessed/performed Overall Cognitive Status: Within Functional Limits for tasks assessed                                     General Comments       Exercises  Other Exercises Other Exercises: Pt educated on two tricep execises to improve independence with push to stand given PWB status: push up from seated position on arm rests, and tricep extensions from seated position with household items as a weight. Pt returns demonstration and verbalizes understanding with 100% accuracy.  Other Exercises: Pt educated about compression stocking management.    Shoulder Instructions      Home Living Family/patient expects to be discharged to:: Private residence Living Arrangements: Spouse/significant other;Children Available Help at Discharge: Family Type of Home: House Home Access: Stairs to enter Technical brewer of Steps: 3 STE Entrance Stairs-Rails: Can reach both [place]: One level     Bathroom Shower/Tub: Occupational psychologist: [place]: Bedside commode;Toilet riser;Walker - 2 wheels;Cane - single point;Shower seat          Prior Functioning/Environment Level of Independence: Independent        Comments: started using SPC in recent months secondary to pain.        OT Problem List: Decreased strength;Decreased range of motion;Decreased activity tolerance;Impaired balance (sitting and/or standing);Pain      OT Treatment/Interventions:      OT Goals(Current goals can be found in the care plan section) Acute Rehab OT Goals Patient Stated Goal: Patient wants to return to PLOF OT Goal Formulation: With patient Time For Goal Achievement: 12/12/17 Potential to Achieve Goals: Good  OT Frequency:      Barriers to D/C:            Co-evaluation              AM-PAC PT "6 Clicks" Daily Activity     Outcome Measure Help from another person eating meals?: None   Help from another person toileting, which includes using toliet, bedpan, or urinal?: A Little Help from another person bathing (including washing, rinsing, drying)?: A Little Help from another person to put on and taking off regular upper body clothing?: A Little Help from another person to put on and taking off regular lower body clothing?: A Little 6 Click Score: 16   End of Session Equipment Utilized During Treatment: Gait belt;Rolling walker Nurse Communication: Mobility status  Activity Tolerance: Patient tolerated treatment well;Patient limited by pain Patient left: in chair;with chair alarm set;with SCD's reapplied  OT Visit Diagnosis: Unsteadiness on feet (R26.81);Other abnormalities of gait and mobility (R26.89);Muscle weakness (generalized) (M62.81)                Time: 7026-3785 OT Time Calculation (min): 24  min Charges:  OT General Charges $OT Visit: 1 Visit OT Evaluation $OT Eval Low Complexity: 1 Low OT Treatments $Self Care/Home Management : 8-22 mins Gerrianne Scale, MS, OTR/L ascom [phone] or [phone] 11/28/17, 1:27 PM

## 2017-11-28 NOTE — Progress Notes (Signed)
Clinical Social Worker (CSW) received SNF consult. PT is recommending home health. RN case manager aware of above. Please reconsult if future social work needs arise. CSW signing off.   Elsi Stelzer, LCSW [phone] 

## 2017-11-28 NOTE — Care Management Note (Addendum)
Case Management Note  Patient Details  Name: Aaron [name] MRN: [id number] Date of Birth: Sep 16, 1950  Subjective/Objective:  Met with patient at bedside to discuss discharge planning. Patient lives at home with his spouse, step daughter and niece. He has a walker and bsc. Provided patient with a list of home care providers. Referral to [facility] for HHPT. Anticoagulant- ASA.  PCP is Dr. Jeananne Rama                 Action/Plan: [facility] for HHPT, No DME, ASA.  Expected Discharge Date:                  Expected Discharge Plan:  [place]  In-House Referral:     Discharge planning Services  CM Consult  Post Acute Care Choice:  Home Health Choice offered to:  Patient  DME Arranged:    DME Agency:     HH Arranged:  PT [place]:  [facility] (formerly [employer])  Status of Service:  In process, will continue to follow  If discussed at Long Length of Stay Meetings, dates discussed:    Additional Comments:  Jolly Mango, RN 11/28/2017, 3:08 PM

## 2017-11-29 LAB — CBC
HCT: 33.4 % — ABNORMAL LOW (ref 40.0–52.0)
Hemoglobin: 11.2 g/dL — ABNORMAL LOW (ref 13.0–18.0)
MCH: 28.2 pg (ref 26.0–34.0)
MCHC: 33.6 g/dL (ref 32.0–36.0)
MCV: 84.1 fL (ref 80.0–100.0)
PLATELETS: 199 10*3/uL (ref 150–440)
RBC: 3.97 MIL/uL — ABNORMAL LOW (ref 4.40–5.90)
RDW: 16.1 % — AB (ref 11.5–14.5)
WBC: 15 10*3/uL — ABNORMAL HIGH (ref 3.8–10.6)

## 2017-11-29 LAB — GLUCOSE, CAPILLARY
GLUCOSE-CAPILLARY: 190 mg/dL — AB (ref 70–99)
GLUCOSE-CAPILLARY: 164 mg/dL — AB (ref 70–99)
Glucose-Capillary: 163 mg/dL — ABNORMAL HIGH (ref 70–99)
Glucose-Capillary: 101 mg/dL — ABNORMAL HIGH (ref 70–99)
Glucose-Capillary: 121 mg/dL — ABNORMAL HIGH (ref 70–99)

## 2017-11-29 LAB — BASIC METABOLIC PANEL
Anion gap: 7 (ref 5–15)
BUN: 19 mg/dL (ref 8–23)
CALCIUM: 8.3 mg/dL — AB (ref 8.9–10.3)
CO2: 32 mmol/L (ref 22–32)
Chloride: 98 mmol/L (ref 98–111)
Creatinine, Ser: 1.52 mg/dL — ABNORMAL HIGH (ref 0.61–1.24)
GFR, EST AFRICAN AMERICAN: 54 mL/min — AB (ref >60)
GFR, EST NON AFRICAN AMERICAN: 46 mL/min — AB (ref >60)
Glucose, Bld: 162 mg/dL — ABNORMAL HIGH (ref 70–99)
Potassium: 3.7 mmol/L (ref 3.5–5.1)
SODIUM: 137 mmol/L (ref 135–145)

## 2017-11-29 LAB — SURGICAL PATHOLOGY

## 2017-11-29 MED ORDER — SODIUM CHLORIDE 0.9 % IV BOLUS
500.0000 mL | Freq: Once | INTRAVENOUS | Status: AC
  Administered 2017-11-29: 500 mL via INTRAVENOUS

## 2017-11-29 MED ORDER — BISACODYL 10 MG RE SUPP
10.0000 mg | Freq: Once | RECTAL | Status: AC
  Administered 2017-11-29: 10 mg via RECTAL
  Filled 2017-11-29: qty 1

## 2017-11-29 MED ORDER — OXYCODONE HCL 5 MG PO TABS: 5.0000 mg | ORAL_TABLET | ORAL | 0 refills | Status: DC | PRN

## 2017-11-29 MED ORDER — METHOCARBAMOL 500 MG PO TABS: 500.0000 mg | ORAL_TABLET | Freq: Four times a day (QID) | ORAL | 0 refills | Status: DC | PRN

## 2017-11-29 MED ORDER — MAGNESIUM HYDROXIDE 400 MG/5ML PO SUSP
15.0000 mL | Freq: Every day | ORAL | Status: DC | PRN
  Administered 2017-11-29: 15 mL via ORAL
  Filled 2017-11-29: qty 30

## 2017-11-29 MED ORDER — DOCUSATE SODIUM 100 MG PO CAPS: 100.0000 mg | ORAL_CAPSULE | Freq: Two times a day (BID) | ORAL | 0 refills | Status: DC

## 2017-11-29 MED ORDER — ACETAMINOPHEN 500 MG PO TABS: 500.0000 mg | ORAL_TABLET | Freq: Four times a day (QID) | ORAL | Status: DC | PRN

## 2017-11-29 MED ORDER — ASPIRIN 325 MG PO TBEC: 325.0000 mg | DELAYED_RELEASE_TABLET | Freq: Every day | ORAL | 0 refills | Status: DC

## 2017-11-29 NOTE — Progress Notes (Addendum)
   Subjective: 2 Days Post-Op Procedure(s) (LRB): TOTAL HIP ARTHROPLASTY ANTERIOR APPROACH (Right) Patient reports pain as 5 on 0-10 scale.   Patient is well, and has had no acute complaints or problems Denies any CP, SOB, ABD pain. We will continue therapy today.  Plan is to go Home after hospital stay.  Objective: Vital signs in last 24 hours: Temp:  [97.8 F (36.6 C)-99.4 F (37.4 C)] 97.8 F (36.6 C) (07/11 0017) Pulse Rate:  [61-82] 82 (07/11 0017) Resp:  [17-20] 17 (07/11 0017) BP: (91-135)/(56-61) 91/56 (07/11 0017) SpO2:  [97 %-100 %] 97 % (07/11 0017)  Intake/Output from previous day: 07/10 0701 - 07/11 0700 In: 1491.7 [P.O.:840; I.V.:651.7] Out: 1595 [NATFT:7322; Drains:20] Intake/Output this shift: No intake/output data recorded.  Recent Labs    11/27/17 0819 11/28/17 0449 11/29/17 0454  HGB 13.3 11.5* 11.2*   Recent Labs    11/28/17 0449 11/29/17 0454  WBC 14.3* 15.0*  RBC 4.13* 3.97*  HCT 34.3* 33.4*  PLT 239 199   Recent Labs    11/28/17 0449 11/29/17 0454  NA 137 137  K 3.6 3.7  CL 101 98  CO2 27 32  BUN 15 19  CREATININE 1.01 1.52*  GLUCOSE 124* 162*  CALCIUM 8.2* 8.3*   No results for input(s): LABPT, INR in the last 72 hours.  EXAM General - Patient is Alert, Appropriate and Oriented Extremity - Sensation intact distally Intact pulses distally Dorsiflexion/Plantar flexion intact No cellulitis present Compartment soft Dressing - dressing C/D/I and no drainage, wound vac intact. hemovac removed Motor Function - intact, moving foot and toes well on exam.   Past Medical History:  Diagnosis Date  . Cancer [facility]) 2013   prostate (radiation and surgery)  . Diabetes mellitus without complication ([place])   . Gout   . Hyperlipidemia   . Hypertension   . Sleep apnea     Assessment/Plan:   2 Days Post-Op Procedure(s) (LRB): TOTAL HIP ARTHROPLASTY ANTERIOR APPROACH (Right) Active Problems:   Status post total hip replacement,  right  Estimated body mass index is 40.55 kg/m as calculated from the following:   Height as of this encounter: 6\' 2"  (1.88 m).   Weight as of this encounter: 143.2 kg (315 lb 12.8 oz). Advance diet Up with therapy, PWB RLE Needs BM Recheck labs in the am Encouraged PO fluids Pain controlled VSS - BP low, patient asymptomatic. Will hold am Carvedilol. Give IV fluids 500cc.  Continue to monitor VSS. May need to hold PM meds if continued Hypotension. CM to assist with discharge to home with HHPT   DVT Prophylaxis - Aspirin, TED hose and SCD Partial weight-Bearing as tolerated to right leg   T. Rachelle Hora, PA-C [place] 11/29/2017, 7:13 AM

## 2017-11-29 NOTE — Progress Notes (Signed)
Pt's last two BP readings have been 91/56 & 91/53. HR ranging from 70-80s. Pt is asymptomatic, but has not been out of bed. Rachelle Hora PA notified, received orders for 500 cc NS bolus and to hold this mornings dose of coreg. Will continue to monitor.   [place], Jerry Caras

## 2017-11-29 NOTE — Progress Notes (Signed)
Physical Therapy Treatment Patient Details Name: Aaron [name] MRN: [id number] DOB: August 20, 1950 Today's Date: 11/29/2017    History of Present Illness Patient is 67 yo male s/p R THA direct anterior approach with PWB  (50%)  with PMH of HTN, diabetes type II insulin dependent, arthritis, prostate cancer, hyperlipidemia, L THA in 2016.    PT Comments    Patient alert and in bed, agreeable to PT R hip pain 4/10. BP medication had just finished at start of session, BP in supine 99/49 HR 60, patient asymptomatic. Patient demonstrated improved quality of therapeutic exercises, though assistance from PT still needed for SLR, hip abduction, and heel slides on RLE. Patient mobilized to EOB with supervision and extended time, BP in sitting 126/55, HR 79.  Patient wanted to ambulate to the bathroom, ambulated a total of 39ft in room with RW, CGA and adherence to [facility] precautions on RLE, fatigue and minimal SOB noted though patient without complaint. Patient up in chair at end of session all needs in reach. BP in sitting 87/43, nursing notified immediately. Patient in NAD, pulse and spO2 stable. Nursing gave consent for patient to remain in chair and was on their way to check on patient.      Follow Up Recommendations  Home health PT     Equipment Recommendations  Rolling walker with 5" wheels;Other (comment)    Recommendations for Other Services       Precautions / Restrictions Precautions Precautions: Anterior Hip;Fall Restrictions Weight Bearing Restrictions: Yes RLE Weight Bearing: Partial weight bearing RLE Partial Weight Bearing Percentage or Pounds: 50%    Mobility  Bed Mobility Overal bed mobility: Needs Assistance Bed Mobility: Sit to Supine     Supine to sit: Supervision     General bed mobility comments: Patient needed extended time to perform bed mobility to maximize independence, verbal cues for encouragement  Transfers Overall transfer level: Needs  assistance Equipment used: Rolling walker (2 wheeled) Transfers: Sit to/from Stand Sit to Stand: Min guard         General transfer comment: Patient able to perform sit <> stand transfer with CGA with elevated bed due to patient's height. Cues for hand placement/technique. Two pillows placed in chair to elevate chair height, patient still exhibiting poor eccentric control for stand to sit, though better than last session.   Ambulation/Gait Ambulation/Gait assistance: Min guard Gait Distance (Feet): 35 Feet Assistive device: Rolling walker (2 wheeled) Gait Pattern/deviations: Step-to pattern;Decreased stride length     General Gait Details: Patient able to ambulate in room with RW and PWB precautions in place, demonstrated fatigue after commode transfer and minimal SOB.    Stairs             Wheelchair Mobility    Modified Rankin (Stroke Patients Only)       Balance                                            Cognition                                              Exercises Total Joint Exercises Ankle Circles/Pumps: AROM;Both;20 reps Quad Sets: AROM;Both;20 reps;Strengthening Gluteal Sets: AROM;Both;20 reps;Strengthening Heel Slides: AROM;Right;20 reps Hip ABduction/ADduction: AROM;AAROM;Right;20 reps Straight Leg Raises:  AAROM;Right;15 reps    General Comments        Pertinent Vitals/Pain Pain Assessment: 0-10 Pain Score: 4  Pain Location: R hip Pain Descriptors / Indicators: Aching Pain Intervention(s): Repositioned;Premedicated before session    Home Living                      Prior Function            PT Goals (current goals can now be found in the care plan section) Progress towards PT goals: Progressing toward goals    Frequency    BID      PT Plan Current plan remains appropriate    Co-evaluation              AM-PAC PT "6 Clicks" Daily Activity  Outcome Measure  Difficulty  turning over in bed (including adjusting bedclothes, sheets and blankets)?: A Little Difficulty moving from lying on back to sitting on the side of the bed? : A Lot Difficulty sitting down on and standing up from a chair with arms (e.g., wheelchair, bedside commode, etc,.)?: Unable Help needed moving to and from a bed to chair (including a wheelchair)?: A Little Help needed walking in hospital room?: A Little Help needed climbing 3-5 steps with a railing? : A Lot 6 Click Score: 14    End of Session Equipment Utilized During Treatment: Gait belt Activity Tolerance: Patient limited by pain;Patient limited by fatigue Patient left: in bed;with call bell/phone within reach;with bed alarm set;with SCD's reapplied;Other (comment) Nurse Communication: Mobility status PT Visit Diagnosis: Unsteadiness on feet (R26.81);Other abnormalities of gait and mobility (R26.89);Difficulty in walking, not elsewhere classified (R26.2);Muscle weakness (generalized) (M62.81);Pain Pain - Right/Left: Right Pain - part of body: Hip     Time: 9432-7614 PT Time Calculation (min) (ACUTE ONLY): 48 min  Charges:  $Gait Training: 8-22 mins $Therapeutic Exercise: 8-22 mins $Therapeutic Activity: 8-22 mins                    G Codes:       Lieutenant Diego PT, DPT 11:41 AM,11/29/17 [phone]

## 2017-11-29 NOTE — Anesthesia Postprocedure Evaluation (Signed)
Anesthesia Post Note  Patient: CHARBEL LOS  Procedure(s) Performed: TOTAL HIP ARTHROPLASTY ANTERIOR APPROACH (Right Hip)  Patient location during evaluation: Nursing Unit Anesthesia Type: Spinal Level of consciousness: awake, awake and alert and oriented Pain management: pain level controlled Vital Signs Assessment: post-procedure vital signs reviewed and stable Respiratory status: spontaneous breathing Cardiovascular status: stable Postop Assessment: no headache, no backache, patient able to bend at knees, no apparent nausea or vomiting, adequate PO intake and able to ambulate Anesthetic complications: no     Last Vitals:  Vitals:   11/29/17 0017 11/29/17 0747  BP: (!) 91/56 (!) 91/53  Pulse: 82 71  Resp: 17   Temp: 36.6 C 37.2 C  SpO2: 97% 95%    Last Pain:  Vitals:   11/29/17 0747  TempSrc: Oral  PainSc:                  Ricki Miller

## 2017-11-29 NOTE — Progress Notes (Signed)
Physical Therapy Treatment Patient Details Name: Aaron [name] MRN: [id number] DOB: [birth year] Today's Date: 11/29/2017    History of Present Illness Patient is 67 yo male s/p R THA direct anterior approach with PWB  (50%)  with PMH of HTN, diabetes type II insulin dependent, arthritis, prostate cancer, hyperlipidemia, L THA in 2016.    PT Comments    Patient agreeable to therapy at start of session, pain in R hip 4/10. BP in supine 111/55. Patient able to assist more with SLR for RLE this session. EOB BP 128/62. Patient ambulated with RW and CGA ~72ft, patient reports fatigue and did not want to walk any further. Returned to chair with verbal/visual cues for controlled transfer. BP in sitting 100/49, patient asymptomatic and nursing notified of BP.      Follow Up Recommendations  Home health PT     Equipment Recommendations  Rolling walker with 5" wheels;Other (comment)    Recommendations for Other Services       Precautions / Restrictions Precautions Precautions: Anterior Hip;Fall Restrictions Weight Bearing Restrictions: Yes RLE Weight Bearing: Partial weight bearing RLE Partial Weight Bearing Percentage or Pounds: 50%    Mobility  Bed Mobility Overal bed mobility: Needs Assistance Bed Mobility: Supine to Sit     Supine to sit: Supervision     General bed mobility comments: Patient needed extended time to complete supine to sit transfer  Transfers Overall transfer level: Needs assistance Equipment used: Rolling walker (2 wheeled) Transfers: Sit to/from Stand Sit to Stand: Min guard         General transfer comment: Patient able to perform sit <> stand transfer with CGA with elevated bed due to patient's height. Cues for hand placement/technique.   Ambulation/Gait Ambulation/Gait assistance: Min guard Gait Distance (Feet): 40 Feet Assistive device: Rolling walker (2 wheeled) Gait Pattern/deviations: Step-to pattern;Decreased stride length     General  Gait Details: Patient able to ambulated to doorway this session, demonstrated less fatigue than last time.   Stairs             Wheelchair Mobility    Modified Rankin (Stroke Patients Only)       Balance                                            Cognition                                              Exercises Total Joint Exercises Ankle Circles/Pumps: AROM;Both;20 reps Gluteal Sets: AROM;Both;20 reps;Strengthening Short Arc Quad: AROM;Right;20 reps Heel Slides: Right;20 reps;AAROM Straight Leg Raises: AAROM;Right;20 reps Long Arc Quad: AROM;10 reps;Right    General Comments        Pertinent Vitals/Pain Pain Assessment: 0-10 Pain Score: 4  Pain Location: R hip Pain Descriptors / Indicators: Sore Pain Intervention(s): Repositioned;Monitored during session    Home Living                      Prior Function            PT Goals (current goals can now be found in the care plan section) Progress towards PT goals: Progressing toward goals    Frequency    BID      PT  Plan Current plan remains appropriate    Co-evaluation              AM-PAC PT "6 Clicks" Daily Activity  Outcome Measure  Difficulty turning over in bed (including adjusting bedclothes, sheets and blankets)?: A Little Difficulty moving from lying on back to sitting on the side of the bed? : A Lot Difficulty sitting down on and standing up from a chair with arms (e.g., wheelchair, bedside commode, etc,.)?: Unable Help needed moving to and from a bed to chair (including a wheelchair)?: A Little Help needed walking in hospital room?: A Little Help needed climbing 3-5 steps with a railing? : A Lot 6 Click Score: 14    End of Session Equipment Utilized During Treatment: Gait belt Activity Tolerance: Patient limited by fatigue Patient left: in chair;with chair alarm set;with call bell/phone within reach;with SCD's reapplied Nurse  Communication: Mobility status PT Visit Diagnosis: Unsteadiness on feet (R26.81);Other abnormalities of gait and mobility (R26.89);Difficulty in walking, not elsewhere classified (R26.2);Muscle weakness (generalized) (M62.81);Pain Pain - Right/Left: Right Pain - part of body: Shoulder     Time: 6812-7517 PT Time Calculation (min) (ACUTE ONLY): 38 min  Charges:  $Gait Training: 8-22 mins $Therapeutic Exercise: 8-22 mins $Therapeutic Activity: 8-22 mins                    G Codes:      Lieutenant Diego PT, DPT 4:52 PM,11/29/17 [phone]

## 2017-11-29 NOTE — Discharge Summary (Addendum)
Physician Discharge Summary  Patient ID: Aaron [name] MRN: [id number] DOB/AGE: 02/14/1951 67 y.o.  Admit date: 11/27/2017 Discharge date: 11/30/2017 Admission Diagnoses:  primary osteoarthritis of right hip   Discharge Diagnoses: Patient Active Problem List   Diagnosis Date Noted  . Status post total hip replacement, right 11/27/2017  . Personal history of prostate cancer 11/14/2017  . Advanced care planning/counseling discussion 08/15/2017  . Degenerative arthritis of cervical spine 02/15/2017  . Sleep apnea 04/26/2015  . Gout 04/26/2015  . Prostate cancer ([place]) 04/26/2015  . 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 BMI 40.0-44.9, adult ([place]) 04/26/2015  . Hematuria, gross 12/21/2014  . Diabetes mellitus without complication ([place])   . Hyperlipidemia   . Hypertension     Past Medical History:  Diagnosis Date  . Cancer [facility]) 2013   prostate (radiation and surgery)  . Diabetes mellitus without complication ([place])   . Gout   . Hyperlipidemia   . Hypertension   . Sleep apnea      Transfusion: none   Consultants (if any):   Discharged Condition: Improved  Hospital Course: Aaron [name] is an 67 y.o. male who was admitted 11/27/2017 with a diagnosis of right hip osteoarthritis and went to the operating room on 11/27/2017 and underwent the above named procedures.    Surgeries: Procedure(s): TOTAL HIP ARTHROPLASTY ANTERIOR APPROACH on 11/27/2017 Patient tolerated the surgery well. Taken to PACU where she was stabilized and then transferred to the orthopedic floor.  Started on aspirin 325 mg daily. SCDs applied bilaterally at 80 mm. Heels elevated on bed with rolled towels. No evidence of DVT. Negative [place]. Physical therapy started on day #1 for gait training and transfer. OT started day #1 for ADL and assisted devices.  Patient's foley was d/c on day #1. Patient's IV and hemovac was d/c on day #2. Patient with hypotension. BP meds held. Patient advised to dc BP meds for 1 weeks  then restart at home. He will monitor his BP daily.  On post op day #3 patient was stable and ready for discharge to home with HHPT.  Implants: Medacta 3 AMIS standard stem, 56 mm Mpact DM cup with liner and S 28 mm ceramic head    He was given perioperative antibiotics:  Anti-infectives (From admission, onward)   Start     Dose/Rate Route Frequency Ordered Stop   11/27/17 1600  ceFAZolin (ANCEF) 3 g in dextrose 5 % 50 mL IVPB  Status:  Discontinued     3 g 100 mL/hr over 30 Minutes Intravenous Every 6 hours 11/27/17 1320 11/28/17 0301   11/27/17 0245  ceFAZolin (ANCEF) 3 g in dextrose 5 % 50 mL IVPB     3 g 100 mL/hr over 30 Minutes Intravenous  Once 11/27/17 0235 11/27/17 1019    .  He was given sequential compression devices, early ambulation, and aspirin with TED hose for DVT prophylaxis.  He benefited maximally from the hospital stay and there were no complications.    Recent vital signs:  Vitals:   11/30/17 0934 11/30/17 0938  BP: (!) 99/50 (!) 100/52  Pulse: 64 65  Resp:    Temp:    SpO2:      Recent laboratory studies:  Lab Results  Component Value Date   HGB 10.2 (L) 11/30/2017   HGB 11.2 (L) 11/29/2017   HGB 11.5 (L) 11/28/2017   Lab Results  Component Value Date   WBC 13.1 (H) 11/30/2017   PLT 185 11/30/2017   Lab Results  Component Value Date   INR 1.06 11/14/2017   Lab Results  Component Value Date   NA 136 11/30/2017   K 4.0 11/30/2017   CL 99 11/30/2017   CO2 30 11/30/2017   BUN 28 (H) 11/30/2017   CREATININE 1.30 (H) 11/30/2017   GLUCOSE 116 (H) 11/30/2017    Discharge Medications:   Allergies as of 11/30/2017      Reactions   Glucophage [metformin] Diarrhea      Medication List    STOP taking these medications   amLODipine 10 MG tablet Commonly known as:  NORVASC   carvedilol 25 MG tablet Commonly known as:  COREG   hydrochlorothiazide 25 MG tablet Commonly known as:  HYDRODIURIL   losartan 100 MG tablet Commonly known  as:  COZAAR     TAKE these medications   acetaminophen 500 MG tablet Commonly known as:  TYLENOL Take 1-2 tablets (500-1,000 mg total) by mouth every 6 (six) hours as needed for mild pain (pain score 1-3 or temp > 100.5).   allopurinol 300 MG tablet Commonly known as:  ZYLOPRIM Take 1 tablet (300 mg total) by mouth daily. What changed:  when to take this   [place] Medication Name: CPAP Equipment, including new machine, mask, and tubing   aspirin 325 MG EC tablet Take 1 tablet (325 mg total) by mouth daily with breakfast.   atorvastatin 20 MG tablet Commonly known as:  LIPITOR Take 1 tablet (20 mg total) by mouth daily. What changed:  when to take this   BD INSULIN SYRINGE ULTRAFINE 31G X 15/64" 1 ML Misc Generic drug:  Insulin Syringe-Needle U-100 USE AS DIRECTED   docusate sodium 100 MG capsule Commonly known as:  COLACE Take 1 capsule (100 mg total) by mouth 2 (two) times daily.   insulin pump Soln Inject into the skin. HUMALOG 100 UNIT/ML injection  USE UP TO 300 UNITS D VIA INSULIN PUMP   methocarbamol 500 MG tablet Commonly known as:  ROBAXIN Take 1 tablet (500 mg total) by mouth every 6 (six) hours as needed for muscle spasms.   oxyCODONE 5 MG immediate release tablet Commonly known as:  Oxy IR/ROXICODONE Take 1-2 tablets (5-10 mg total) by mouth every 4 (four) hours as needed for moderate pain (pain score 4-6).            Durable Medical Equipment  (From admission, onward)        Start     Ordered   11/27/17 1321  DME Walker rolling  Once    Question:  Patient needs a walker to treat with the following condition  Answer:  Status post total hip replacement, right   11/27/17 1320   11/27/17 1321  DME 3 n 1  Once     11/27/17 1320   11/27/17 1321  DME Bedside commode  Once    Question:  Patient needs a bedside commode to treat with the following condition  Answer:  Status post total hip replacement, right   11/27/17 1320       Diagnostic Studies: Dg Hip Operative Unilat W Or W/o Pelvis Right  Result Date: 11/27/2017 CLINICAL DATA:  RIGHT hip surgery EXAM: OPERATIVE RIGHT HIP (WITH PELVIS IF PERFORMED) 2 VIEWS TECHNIQUE: Fluoroscopic spot image(s) were submitted for interpretation post-operatively. COMPARISON:  0 minutes 12 seconds Dose: 7.2 mGy FINDINGS: RIGHT hip prosthesis identified. Tip of femoral component not imaged. No fracture or dislocation identified within visualized structures. IMPRESSION: RIGHT hip prosthesis without acute  complication. Electronically Signed   By: Lavonia Dana M.D.   On: 11/27/2017 12:17   Dg Hip Unilat W Or W/o Pelvis 2-3 Views Right  Result Date: 11/27/2017 CLINICAL DATA:  Status post right hip replacement. EXAM: DG HIP (WITH OR WITHOUT PELVIS) 2-3V RIGHT COMPARISON:  No recent studies in PACs FINDINGS: Two fluoro spot images of the right hip are reviewed. Radiographic positioning of the prosthetic components is good. The interface with the native bone appears normal. There are surgical drain lines and skin staples present. IMPRESSION: No immediate postprocedure complication following right hip joint prosthesis placement. Electronically Signed   By: David  Martinique M.D.   On: 11/27/2017 13:15    Disposition: Discharge disposition: 01-Home or Self Care         Follow-up Information    Duanne Guess, PA-C On 12/12/2017.   Specialties:  Orthopedic Surgery, Emergency Medicine Why:  for staple removal and steri strip application AT [id number] Contact information: [place] [address] [phone]            Signed: Dorise H[facility] 11/30/2017, 12:06 PM

## 2017-11-29 NOTE — Discharge Instructions (Signed)

## 2017-11-30 LAB — BASIC METABOLIC PANEL
Anion gap: 7 (ref 5–15)
BUN: 28 mg/dL — ABNORMAL HIGH (ref 8–23)
CALCIUM: 8.3 mg/dL — AB (ref 8.9–10.3)
CHLORIDE: 99 mmol/L (ref 98–111)
CO2: 30 mmol/L (ref 22–32)
CREATININE: 1.3 mg/dL — AB (ref 0.61–1.24)
GFR calc Af Amer: >60 mL/min (ref >60)
GFR calc non Af Amer: 56 mL/min — ABNORMAL LOW (ref >60)
Glucose, Bld: 116 mg/dL — ABNORMAL HIGH (ref 70–99)
Potassium: 4 mmol/L (ref 3.5–5.1)
SODIUM: 136 mmol/L (ref 135–145)

## 2017-11-30 LAB — CBC
HCT: 30.3 % — ABNORMAL LOW (ref 40.0–52.0)
Hemoglobin: 10.2 g/dL — ABNORMAL LOW (ref 13.0–18.0)
MCH: 28.1 pg (ref 26.0–34.0)
MCHC: 33.6 g/dL (ref 32.0–36.0)
MCV: 83.5 fL (ref 80.0–100.0)
Platelets: 185 10*3/uL (ref 150–440)
RBC: 3.62 MIL/uL — ABNORMAL LOW (ref 4.40–5.90)
RDW: 15.9 % — AB (ref 11.5–14.5)
WBC: 13.1 10*3/uL — ABNORMAL HIGH (ref 3.8–10.6)

## 2017-11-30 LAB — GLUCOSE, CAPILLARY
GLUCOSE-CAPILLARY: 120 mg/dL — AB (ref 70–99)
Glucose-Capillary: 81 mg/dL (ref 70–99)
Glucose-Capillary: 77 mg/dL (ref 70–99)

## 2017-11-30 MED ORDER — SODIUM CHLORIDE 0.9 % IV BOLUS
500.0000 mL | Freq: Once | INTRAVENOUS | Status: AC
  Administered 2017-11-30: 500 mL via INTRAVENOUS

## 2017-11-30 NOTE — Care Management Important Message (Signed)
Important Message  Patient Details  Name: Aaron [name] MRN: [id number] Date of Birth: [birth year]   Medicare Important Message Given:  Yes    [name] 11/30/2017, 11:37 AM

## 2017-11-30 NOTE — Progress Notes (Signed)
Physical Therapy Treatment Patient Details Name: Aaron [name] MRN: [id number] DOB: September 14, 1950 Today's Date: 11/30/2017    History of Present Illness Patient is 67 yo male s/p R THA direct anterior approach with PWB  (50%)  with PMH of HTN, diabetes type II insulin dependent, arthritis, prostate cancer, hyperlipidemia, L THA in 2016.    PT Comments    Patient alert in bed agreeable to therapy, BP at rest 124/58. Patient able to complete therapeutic exercises AROM today, with some assistance for intermittent reps of SLR on RLE. Patient mobilized to EOB with extended time with supervision, transferred with CGA x2, BP slightly lower though no complaints from patient. The patient was able to ambulate with RW, CGA ~3ft this session with complaints of fatigue. Stair navigation performed with CGA, bilateral rails, step to pattern, and verbal cues, patient adhering to [facility] precautions. Patient in chair at end of session BP 78/43. Nursing notified immediately.  BP assessed every 2-56minutes, 88/48, and 76/44, 82/30. Patient in no acute distress, stable HR, spO2. Nursing in room to administer medications, as well as physician to address patient at end of session.      Follow Up Recommendations  Home health PT     Equipment Recommendations  Rolling walker with 5" wheels;Other (comment)    Recommendations for Other Services       Precautions / Restrictions Precautions Precautions: Anterior Hip;Fall Restrictions Weight Bearing Restrictions: Yes RLE Weight Bearing: Partial weight bearing RLE Partial Weight Bearing Percentage or Pounds: 50%    Mobility  Bed Mobility Overal bed mobility: Needs Assistance Bed Mobility: Supine to Sit     Supine to sit: Supervision        Transfers Overall transfer level: Needs assistance Equipment used: Rolling walker (2 wheeled) Transfers: Sit to/from Stand Sit to Stand: Min assist         General transfer comment: Patient needs minAx1 to return  to chair due to fatigue/poor eccentric control  Ambulation/Gait   Gait Distance (Feet): 80 Feet Assistive device: Rolling walker (2 wheeled) Gait Pattern/deviations: Step-to pattern;Decreased stride length     General Gait Details: Patient able to ambulate to stairs, perform stair navigation, and then to nurses station with RW, CGA and within weight bearing precautions. Complaints of fatigue.   Stairs             Wheelchair Mobility    Modified Rankin (Stroke Patients Only)       Balance                                            Cognition Arousal/Alertness: Awake/alert Behavior During Therapy: WFL for tasks assessed/performed                                          Exercises Total Joint Exercises Ankle Circles/Pumps: AROM;Both;20 reps Heel Slides: AROM;Right;20 reps Hip ABduction/ADduction: AROM;Right;20 reps Straight Leg Raises: AROM;Right;AAROM;20 reps    General Comments        Pertinent Vitals/Pain Pain Assessment: 0-10 Pain Score: 4  Pain Location: R hip Pain Descriptors / Indicators: Sore Pain Intervention(s): Repositioned;Monitored during session;Limited activity within patient's tolerance    Home Living  Prior Function            PT Goals (current goals can now be found in the care plan section) Acute Rehab PT Goals Patient Stated Goal: Patient wants to return to PLOF PT Goal Formulation: With patient Time For Goal Achievement: 12/11/17 Potential to Achieve Goals: Good Progress towards PT goals: Progressing toward goals    Frequency    BID      PT Plan Current plan remains appropriate    Co-evaluation              AM-PAC PT "6 Clicks" Daily Activity  Outcome Measure  Difficulty turning over in bed (including adjusting bedclothes, sheets and blankets)?: A Little Difficulty moving from lying on back to sitting on the side of the bed? : A Lot Difficulty  sitting down on and standing up from a chair with arms (e.g., wheelchair, bedside commode, etc,.)?: Unable Help needed moving to and from a bed to chair (including a wheelchair)?: A Little Help needed walking in hospital room?: A Little Help needed climbing 3-5 steps with a railing? : A Little 6 Click Score: 15    End of Session Equipment Utilized During Treatment: Gait belt Activity Tolerance: Patient limited by fatigue Patient left: in chair;with chair alarm set;with call bell/phone within reach;with SCD's reapplied;with nursing/sitter in room Nurse Communication: Mobility status;Other (comment)(Patients BP) PT Visit Diagnosis: Unsteadiness on feet (R26.81);Other abnormalities of gait and mobility (R26.89);Difficulty in walking, not elsewhere classified (R26.2);Muscle weakness (generalized) (M62.81);Pain Pain - Right/Left: Right Pain - part of body: Hip     Time: 0817-0912 PT Time Calculation (min) (ACUTE ONLY): 55 min  Charges:  $Gait Training: 8-22 mins $Therapeutic Exercise: 8-22 mins $Therapeutic Activity: 8-22 mins                    G Codes:      Lieutenant Diego PT, DPT 9:25 AM,11/30/17 [phone]

## 2017-11-30 NOTE — Care Management (Signed)
confirmed that patient does have a walker and bedside commode in the home

## 2017-11-30 NOTE — Progress Notes (Signed)
Patient wound vac converted to Prevena prior to discharge. AVS reviewed with verbal understanding. Answered all questions. Escorted to personal vehicle via wc.

## 2017-11-30 NOTE — Care Management (Signed)
Notified [facility] of discharge order.

## 2017-11-30 NOTE — Progress Notes (Addendum)
   Subjective: 3 Days Post-Op Procedure(s) (LRB): TOTAL HIP ARTHROPLASTY ANTERIOR APPROACH (Right) Patient reports pain as mild.   Patient is well, and has had no acute complaints or problems Denies any CP, SOB, ABD pain. We will continue therapy today.  Plan is to go Home after hospital stay.  Objective: Vital signs in last 24 hours: Temp:  [98 F (36.7 C)-98.8 F (37.1 C)] 98 F (36.7 C) (07/12 0351) Pulse Rate:  [63-75] 63 (07/12 0351) Resp:  [18] 18 (07/12 0351) BP: (87-111)/(43-56) 102/52 (07/12 0351) SpO2:  [97 %-100 %] 99 % (07/12 0351)  Intake/Output from previous day: 07/11 0701 - 07/12 0700 In: 1320 [P.O.:1320] Out: 850 [Urine:850] Intake/Output this shift: Total I/O In: 0  Out: 325 [Urine:325]  Recent Labs    11/27/17 0819 11/28/17 0449 11/29/17 0454 11/30/17 0355  HGB 13.3 11.5* 11.2* 10.2*   Recent Labs    11/29/17 0454 11/30/17 0355  WBC 15.0* 13.1*  RBC 3.97* 3.62*  HCT 33.4* 30.3*  PLT 199 185   Recent Labs    11/29/17 0454 11/30/17 0355  NA 137 136  K 3.7 4.0  CL 98 99  CO2 32 30  BUN 19 28*  CREATININE 1.52* 1.30*  GLUCOSE 162* 116*  CALCIUM 8.3* 8.3*   No results for input(s): LABPT, INR in the last 72 hours.  EXAM General - Patient is Alert, Appropriate and Oriented Extremity - Sensation intact distally Intact pulses distally Dorsiflexion/Plantar flexion intact No cellulitis present Compartment soft Dressing - dressing C/D/I and no drainage, wound vac intact.  Motor Function - intact, moving foot and toes well on exam.   Past Medical History:  Diagnosis Date  . Cancer [facility]) 2013   prostate (radiation and surgery)  . Diabetes mellitus without complication ([place])   . Gout   . Hyperlipidemia   . Hypertension   . Sleep apnea     Assessment/Plan:   3 Days Post-Op Procedure(s) (LRB): TOTAL HIP ARTHROPLASTY ANTERIOR APPROACH (Right) Active Problems:   Status post total hip replacement, right  Estimated body mass  index is 40.55 kg/m as calculated from the following:   Height as of this encounter: 6\' 2"  (1.88 m).   Weight as of this encounter: 143.2 kg (315 lb 12.8 oz). Advance diet Up with therapy, PWB RLE Encouraged PO fluids Pain controlled VSS - BP Improving, patient asymptomatic. Will hold am Carvedilol. Continue to monitor VS. Addendum: BP low this am, BP meds held and patient will Hold BP meds for 1 week at home then restart. Patient will continue to monitor BP at home daily. CM to assist with discharge to home with [place]  discharge home with home health PT today pending completion of physical therapy goals  DVT Prophylaxis - Aspirin, TED hose and SCD Partial weight-Bearing as tolerated to right leg   T. Rachelle Hora, PA-C [place] 11/30/2017, 7:47 AM

## 2017-12-02 DIAGNOSIS — M109 Gout, unspecified: Secondary | ICD-10-CM | POA: Diagnosis not present

## 2017-12-02 DIAGNOSIS — Z794 Long term (current) use of insulin: Secondary | ICD-10-CM | POA: Diagnosis not present

## 2017-12-02 DIAGNOSIS — I10 Essential (primary) hypertension: Secondary | ICD-10-CM | POA: Diagnosis not present

## 2017-12-02 DIAGNOSIS — E119 Type 2 diabetes mellitus without complications: Secondary | ICD-10-CM | POA: Diagnosis not present

## 2017-12-02 DIAGNOSIS — M47812 Spondylosis without myelopathy or radiculopathy, cervical region: Secondary | ICD-10-CM | POA: Diagnosis not present

## 2017-12-02 DIAGNOSIS — Z471 Aftercare following joint replacement surgery: Secondary | ICD-10-CM | POA: Diagnosis not present

## 2017-12-04 DIAGNOSIS — I10 Essential (primary) hypertension: Secondary | ICD-10-CM | POA: Diagnosis not present

## 2017-12-04 DIAGNOSIS — Z794 Long term (current) use of insulin: Secondary | ICD-10-CM | POA: Diagnosis not present

## 2017-12-04 DIAGNOSIS — Z471 Aftercare following joint replacement surgery: Secondary | ICD-10-CM | POA: Diagnosis not present

## 2017-12-04 DIAGNOSIS — E119 Type 2 diabetes mellitus without complications: Secondary | ICD-10-CM | POA: Diagnosis not present

## 2017-12-04 DIAGNOSIS — M109 Gout, unspecified: Secondary | ICD-10-CM | POA: Diagnosis not present

## 2017-12-04 DIAGNOSIS — M47812 Spondylosis without myelopathy or radiculopathy, cervical region: Secondary | ICD-10-CM | POA: Diagnosis not present

## 2017-12-05 DIAGNOSIS — G4733 Obstructive sleep apnea (adult) (pediatric): Secondary | ICD-10-CM | POA: Diagnosis not present

## 2017-12-06 DIAGNOSIS — M47812 Spondylosis without myelopathy or radiculopathy, cervical region: Secondary | ICD-10-CM | POA: Diagnosis not present

## 2017-12-06 DIAGNOSIS — Z471 Aftercare following joint replacement surgery: Secondary | ICD-10-CM | POA: Diagnosis not present

## 2017-12-06 DIAGNOSIS — E119 Type 2 diabetes mellitus without complications: Secondary | ICD-10-CM | POA: Diagnosis not present

## 2017-12-06 DIAGNOSIS — I10 Essential (primary) hypertension: Secondary | ICD-10-CM | POA: Diagnosis not present

## 2017-12-06 DIAGNOSIS — M109 Gout, unspecified: Secondary | ICD-10-CM | POA: Diagnosis not present

## 2017-12-06 DIAGNOSIS — Z794 Long term (current) use of insulin: Secondary | ICD-10-CM | POA: Diagnosis not present

## 2017-12-10 DIAGNOSIS — M47812 Spondylosis without myelopathy or radiculopathy, cervical region: Secondary | ICD-10-CM | POA: Diagnosis not present

## 2017-12-10 DIAGNOSIS — M109 Gout, unspecified: Secondary | ICD-10-CM | POA: Diagnosis not present

## 2017-12-10 DIAGNOSIS — Z471 Aftercare following joint replacement surgery: Secondary | ICD-10-CM | POA: Diagnosis not present

## 2017-12-10 DIAGNOSIS — I10 Essential (primary) hypertension: Secondary | ICD-10-CM | POA: Diagnosis not present

## 2017-12-10 DIAGNOSIS — Z794 Long term (current) use of insulin: Secondary | ICD-10-CM | POA: Diagnosis not present

## 2017-12-10 DIAGNOSIS — E119 Type 2 diabetes mellitus without complications: Secondary | ICD-10-CM | POA: Diagnosis not present

## 2017-12-12 DIAGNOSIS — M109 Gout, unspecified: Secondary | ICD-10-CM | POA: Diagnosis not present

## 2017-12-12 DIAGNOSIS — M47812 Spondylosis without myelopathy or radiculopathy, cervical region: Secondary | ICD-10-CM | POA: Diagnosis not present

## 2017-12-12 DIAGNOSIS — I10 Essential (primary) hypertension: Secondary | ICD-10-CM | POA: Diagnosis not present

## 2017-12-12 DIAGNOSIS — Z471 Aftercare following joint replacement surgery: Secondary | ICD-10-CM | POA: Diagnosis not present

## 2017-12-12 DIAGNOSIS — E119 Type 2 diabetes mellitus without complications: Secondary | ICD-10-CM | POA: Diagnosis not present

## 2017-12-12 DIAGNOSIS — Z794 Long term (current) use of insulin: Secondary | ICD-10-CM | POA: Diagnosis not present

## 2017-12-13 DIAGNOSIS — Z794 Long term (current) use of insulin: Secondary | ICD-10-CM | POA: Diagnosis not present

## 2017-12-13 DIAGNOSIS — M109 Gout, unspecified: Secondary | ICD-10-CM | POA: Diagnosis not present

## 2017-12-13 DIAGNOSIS — M47812 Spondylosis without myelopathy or radiculopathy, cervical region: Secondary | ICD-10-CM | POA: Diagnosis not present

## 2017-12-13 DIAGNOSIS — Z471 Aftercare following joint replacement surgery: Secondary | ICD-10-CM | POA: Diagnosis not present

## 2017-12-13 DIAGNOSIS — I10 Essential (primary) hypertension: Secondary | ICD-10-CM | POA: Diagnosis not present

## 2017-12-13 DIAGNOSIS — E119 Type 2 diabetes mellitus without complications: Secondary | ICD-10-CM | POA: Diagnosis not present

## 2017-12-17 DIAGNOSIS — M109 Gout, unspecified: Secondary | ICD-10-CM | POA: Diagnosis not present

## 2017-12-17 DIAGNOSIS — M47812 Spondylosis without myelopathy or radiculopathy, cervical region: Secondary | ICD-10-CM | POA: Diagnosis not present

## 2017-12-17 DIAGNOSIS — Z794 Long term (current) use of insulin: Secondary | ICD-10-CM | POA: Diagnosis not present

## 2017-12-17 DIAGNOSIS — Z471 Aftercare following joint replacement surgery: Secondary | ICD-10-CM | POA: Diagnosis not present

## 2017-12-17 DIAGNOSIS — E119 Type 2 diabetes mellitus without complications: Secondary | ICD-10-CM | POA: Diagnosis not present

## 2017-12-17 DIAGNOSIS — I10 Essential (primary) hypertension: Secondary | ICD-10-CM | POA: Diagnosis not present

## 2017-12-19 DIAGNOSIS — I10 Essential (primary) hypertension: Secondary | ICD-10-CM | POA: Diagnosis not present

## 2017-12-19 DIAGNOSIS — E119 Type 2 diabetes mellitus without complications: Secondary | ICD-10-CM | POA: Diagnosis not present

## 2017-12-19 DIAGNOSIS — M109 Gout, unspecified: Secondary | ICD-10-CM | POA: Diagnosis not present

## 2017-12-19 DIAGNOSIS — M47812 Spondylosis without myelopathy or radiculopathy, cervical region: Secondary | ICD-10-CM | POA: Diagnosis not present

## 2017-12-19 DIAGNOSIS — Z794 Long term (current) use of insulin: Secondary | ICD-10-CM | POA: Diagnosis not present

## 2017-12-19 DIAGNOSIS — Z471 Aftercare following joint replacement surgery: Secondary | ICD-10-CM | POA: Diagnosis not present

## 2017-12-24 DIAGNOSIS — Z794 Long term (current) use of insulin: Secondary | ICD-10-CM | POA: Diagnosis not present

## 2017-12-24 DIAGNOSIS — Z471 Aftercare following joint replacement surgery: Secondary | ICD-10-CM | POA: Diagnosis not present

## 2017-12-24 DIAGNOSIS — M47812 Spondylosis without myelopathy or radiculopathy, cervical region: Secondary | ICD-10-CM | POA: Diagnosis not present

## 2017-12-24 DIAGNOSIS — M109 Gout, unspecified: Secondary | ICD-10-CM | POA: Diagnosis not present

## 2017-12-24 DIAGNOSIS — E119 Type 2 diabetes mellitus without complications: Secondary | ICD-10-CM | POA: Diagnosis not present

## 2017-12-24 DIAGNOSIS — I10 Essential (primary) hypertension: Secondary | ICD-10-CM | POA: Diagnosis not present

## 2017-12-27 ENCOUNTER — Encounter: Payer: Self-pay | Admitting: Urology

## 2017-12-27 ENCOUNTER — Ambulatory Visit: Payer: Medicare HMO | Admitting: Urology

## 2017-12-27 VITALS — BP 173/93 | HR 80 | Ht 74.0 in | Wt 310.2 lb

## 2017-12-27 DIAGNOSIS — R82998 Other abnormal findings in urine: Secondary | ICD-10-CM | POA: Diagnosis not present

## 2017-12-27 DIAGNOSIS — Z8546 Personal history of malignant neoplasm of prostate: Secondary | ICD-10-CM

## 2017-12-27 DIAGNOSIS — R310 Gross hematuria: Secondary | ICD-10-CM | POA: Diagnosis not present

## 2017-12-27 LAB — URINALYSIS, COMPLETE
Bilirubin, UA: NEGATIVE
Glucose, UA: NEGATIVE
Ketones, UA: NEGATIVE
Leukocytes, UA: NEGATIVE
NITRITE UA: NEGATIVE
PH UA: 5.5 (ref 5.0–7.5)
Specific Gravity, UA: >1.03 — ABNORMAL HIGH (ref 1.005–1.030)
UUROB: 0.2 mg/dL (ref 0.2–1.0)

## 2017-12-27 LAB — MICROSCOPIC EXAMINATION

## 2017-12-27 MED ORDER — CIPROFLOXACIN HCL 500 MG PO TABS
500.0000 mg | ORAL_TABLET | Freq: Once | ORAL | Status: AC
  Administered 2017-12-27: 500 mg via ORAL

## 2017-12-27 MED ORDER — LIDOCAINE HCL URETHRAL/MUCOSAL 2 % EX GEL
1.0000 "application " | Freq: Once | CUTANEOUS | Status: AC
  Administered 2017-12-27: 1 via URETHRAL

## 2017-12-27 NOTE — Progress Notes (Signed)
   12/27/17  CC:  Chief Complaint  Patient presents with  . Cysto    HPI: Seen 6/26 for a UTI and intermittent gross hematuria.  CTU not performed and patient states he was never called by radiology to schedule.  Blood pressure (!) 173/93, pulse 80, height 6\' 2"  (1.88 m), weight (!) 310 lb 3.2 oz (140.7 kg). NED. A&Ox3.   No respiratory distress   Abd soft, NT, ND Normal phallus with bilateral descended testicles  Cystoscopy Procedure Note  Patient identification was confirmed, informed consent was obtained, and patient was prepped using Betadine solution.  Lidocaine jelly was administered per urethral meatus.    Preoperative abx where received prior to procedure.     Pre-Procedure: - Inspection reveals a normal caliber ureteral meatus.  Procedure: The flexible cystoscope was introduced without difficulty - No urethral strictures/lesions are present. - Surgically absent prostate  - Normal bladder neck - Bilateral ureteral orifices identified - Bladder mucosa  reveals no ulcers, tumors.  Scattered hypervascularity consistent with radiation change. - No bladder stones - No trabeculation  Retroflexion shows no abnormalities   Post-Procedure: - Patient tolerated the procedure well  Assessment/ Plan: Cystoscopy consistent with radiation change which is the most likely source of his hematuria.  Urine cytology was sent.  CT urogram will be scheduled.

## 2017-12-28 DIAGNOSIS — Z471 Aftercare following joint replacement surgery: Secondary | ICD-10-CM | POA: Diagnosis not present

## 2017-12-28 DIAGNOSIS — E119 Type 2 diabetes mellitus without complications: Secondary | ICD-10-CM | POA: Diagnosis not present

## 2017-12-28 DIAGNOSIS — M47812 Spondylosis without myelopathy or radiculopathy, cervical region: Secondary | ICD-10-CM | POA: Diagnosis not present

## 2017-12-28 DIAGNOSIS — M109 Gout, unspecified: Secondary | ICD-10-CM | POA: Diagnosis not present

## 2017-12-28 DIAGNOSIS — I10 Essential (primary) hypertension: Secondary | ICD-10-CM | POA: Diagnosis not present

## 2017-12-28 DIAGNOSIS — Z794 Long term (current) use of insulin: Secondary | ICD-10-CM | POA: Diagnosis not present

## 2017-12-28 IMAGING — CR DG SHOULDER 2+V*L*
1 series · 3 of 3 positions shown · non-contrast
Comparison: None.

CLINICAL DATA: Pain after fall

EXAM:
LEFT SHOULDER - 2+ VIEW

[Series 1: dg shoulder left · 0.14mm/px · 3 of 3 slices shown]
[im 1/3]
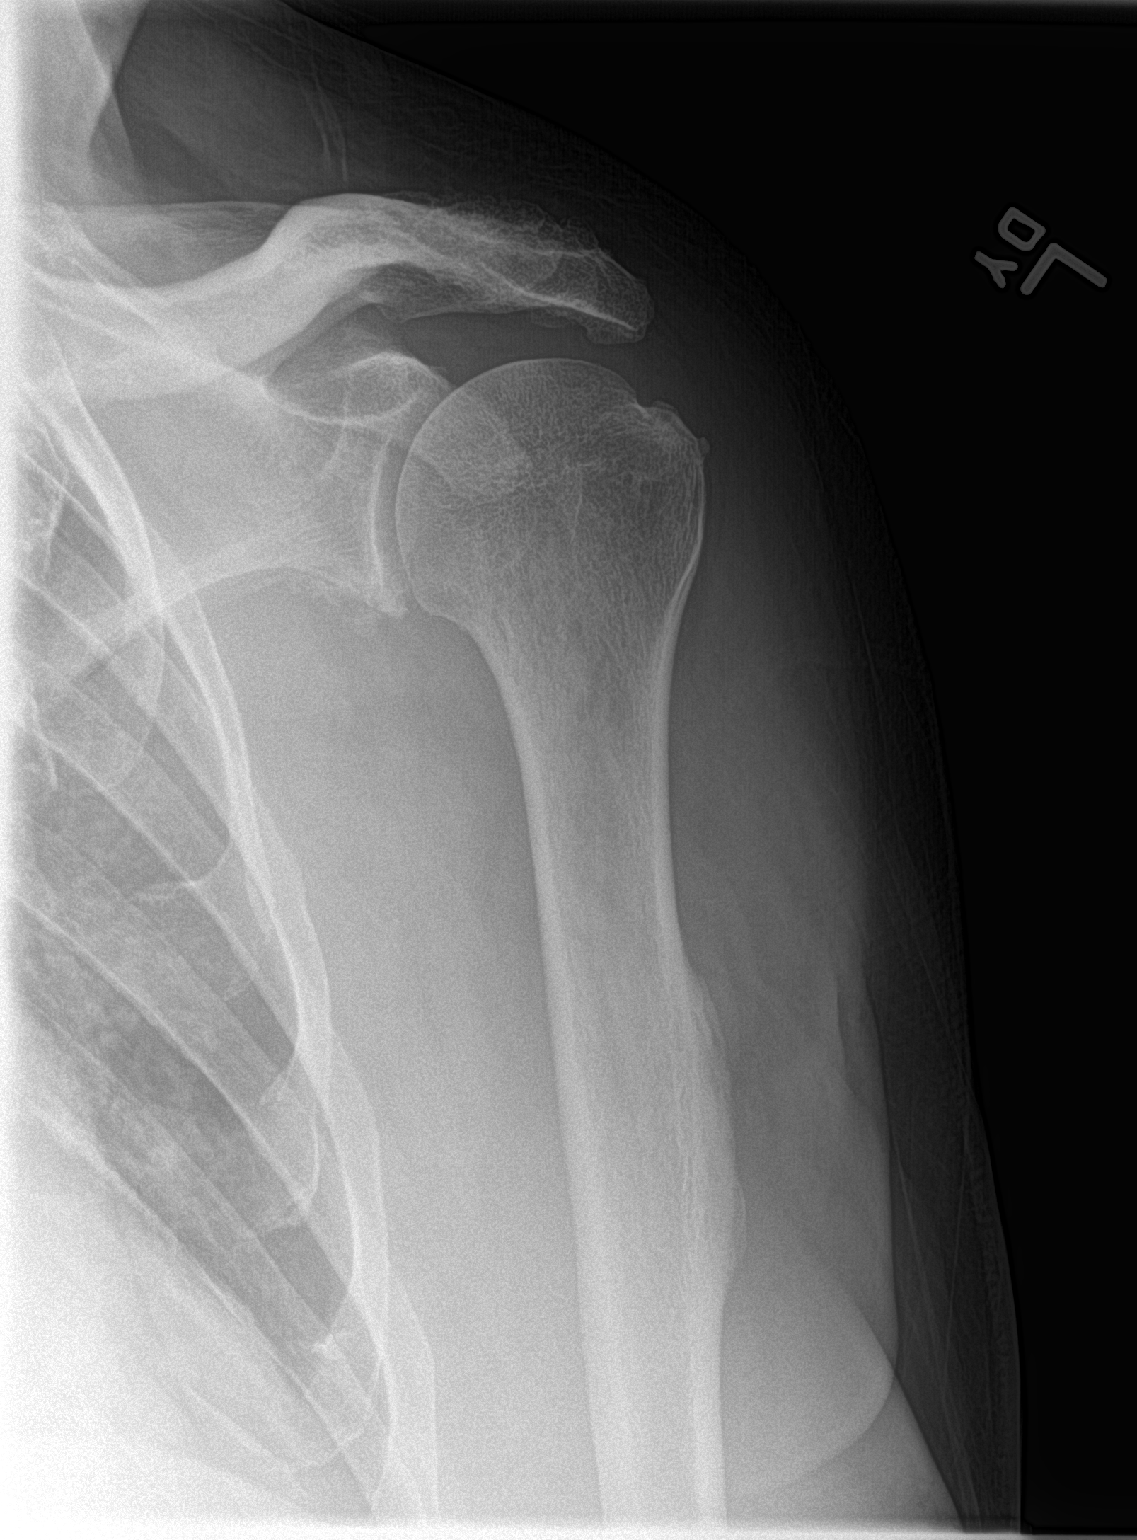
[im 2/3]
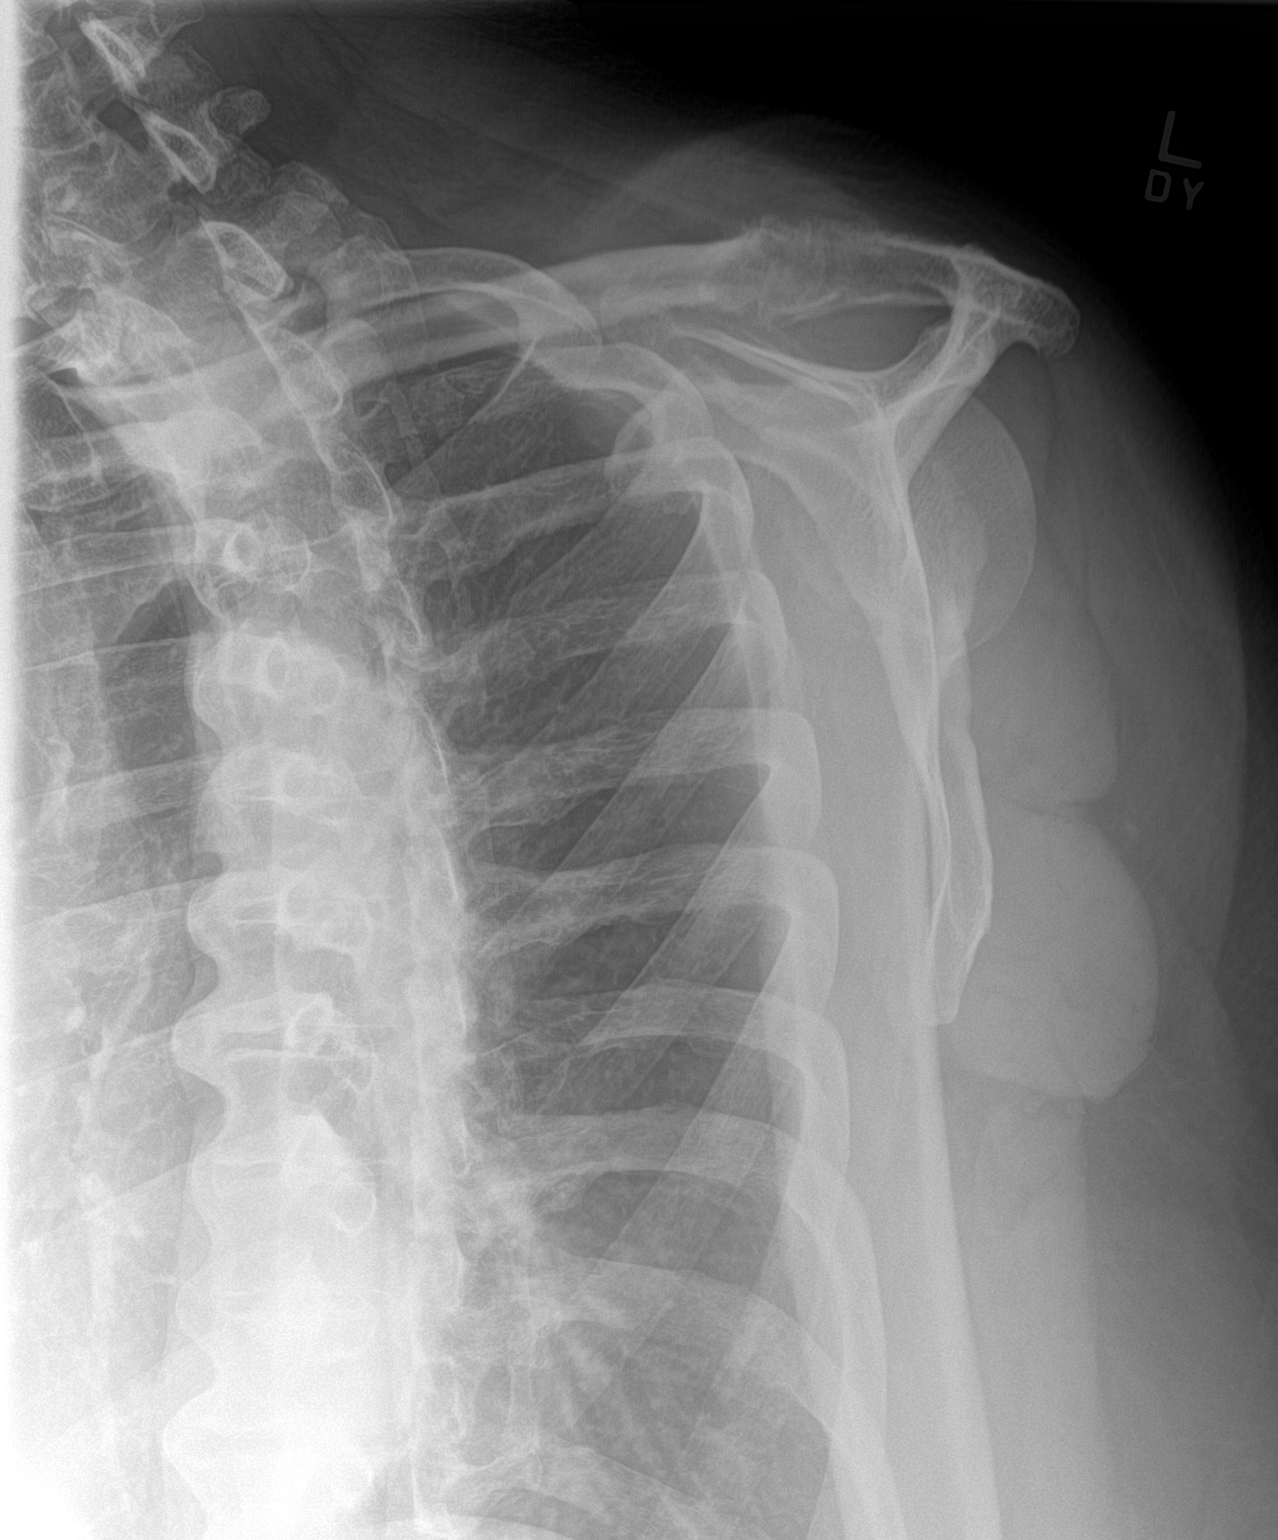
[im 3/3]
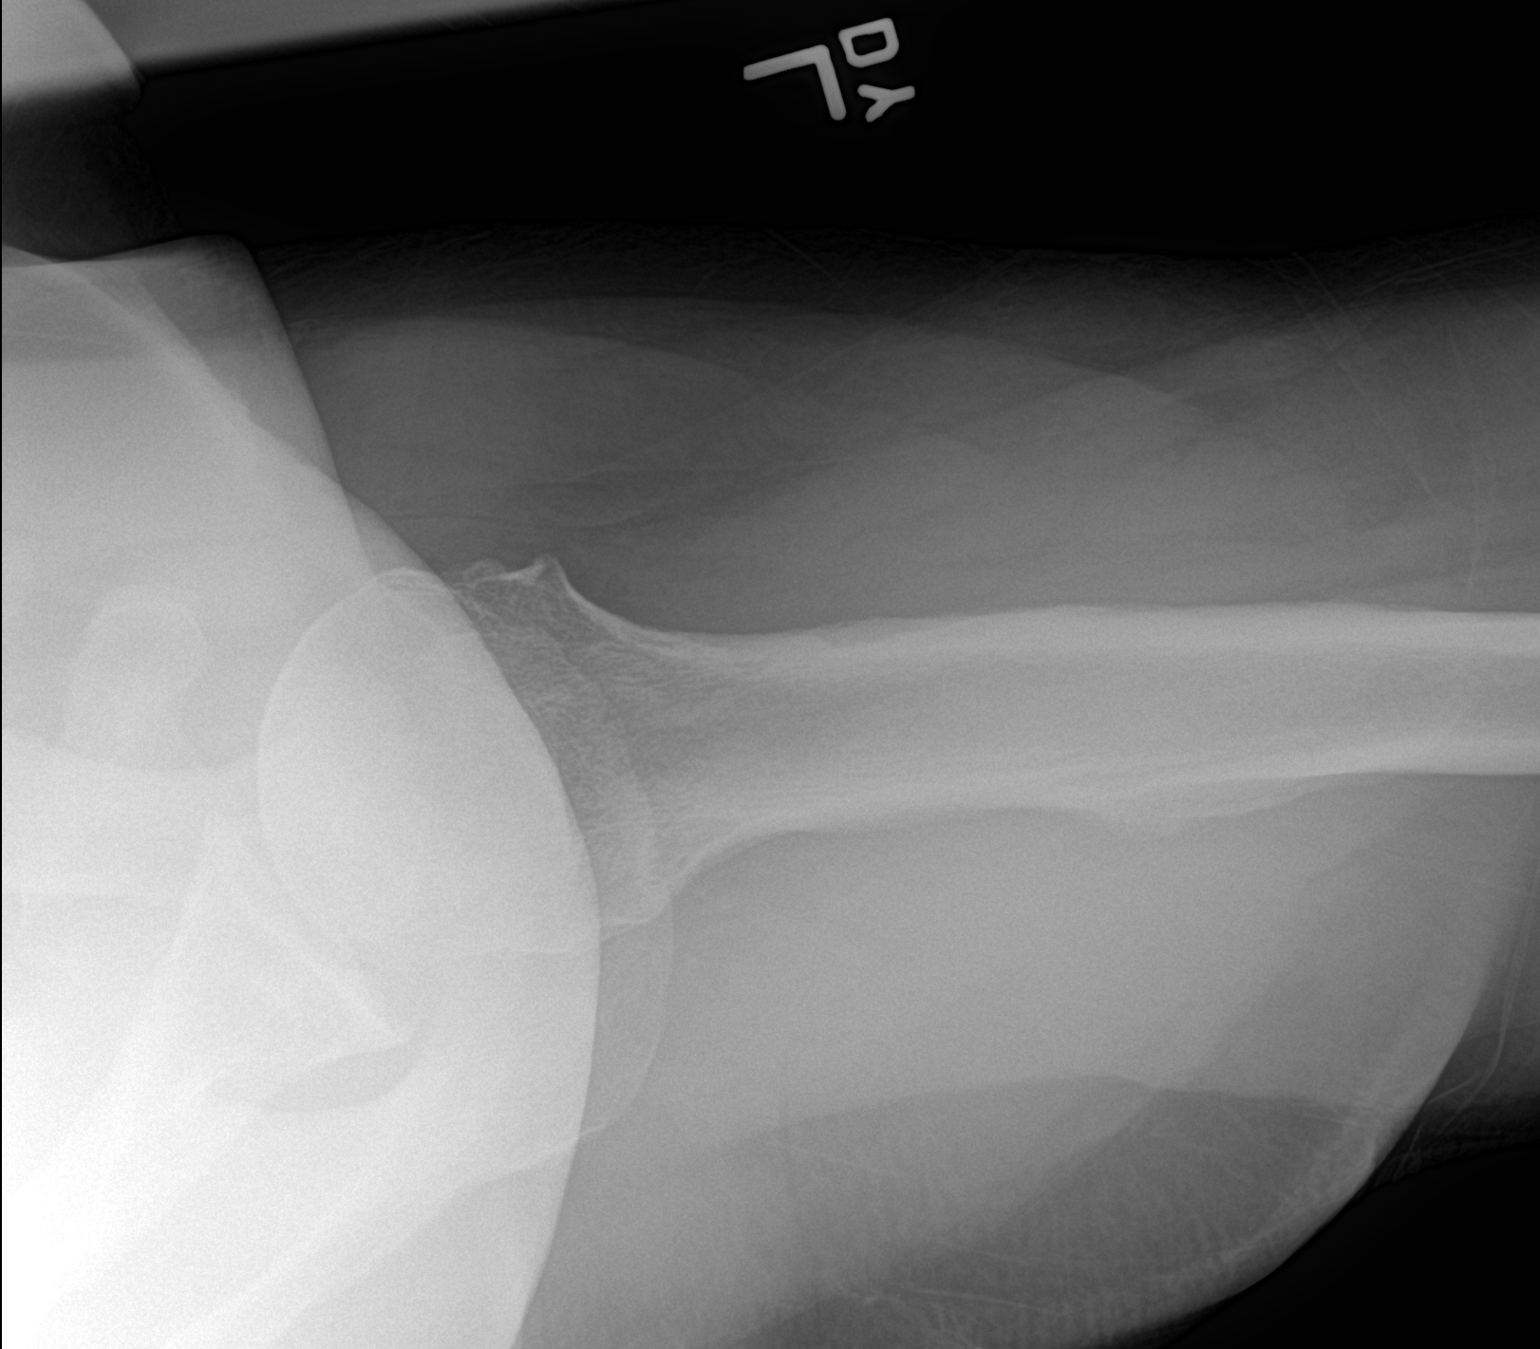

[3 of 3 positions shown; findings below may reference images not displayed]

FINDINGS: There is no evidence of fracture or dislocation. There is no
evidence of arthropathy or other focal bone abnormality. Soft
tissues are unremarkable.
IMPRESSION: Negative.

## 2017-12-31 ENCOUNTER — Other Ambulatory Visit: Payer: Self-pay | Admitting: Urology

## 2017-12-31 DIAGNOSIS — N029 Recurrent and persistent hematuria with unspecified morphologic changes: Secondary | ICD-10-CM

## 2018-01-01 DIAGNOSIS — Z471 Aftercare following joint replacement surgery: Secondary | ICD-10-CM | POA: Diagnosis not present

## 2018-01-02 ENCOUNTER — Ambulatory Visit
Admission: RE | Admit: 2018-01-02 | Discharge: 2018-01-02 | Disposition: A | Discharge status: Home / Self Care | Payer: Medicare HMO | Source: Ambulatory Visit | Attending: Urology | Admitting: Urology

## 2018-01-02 DIAGNOSIS — R310 Gross hematuria: Secondary | ICD-10-CM | POA: Diagnosis not present

## 2018-01-02 DIAGNOSIS — I2510 Atherosclerotic heart disease of native coronary artery without angina pectoris: Secondary | ICD-10-CM | POA: Insufficient documentation

## 2018-01-02 DIAGNOSIS — Z9079 Acquired absence of other genital organ(s): Secondary | ICD-10-CM | POA: Insufficient documentation

## 2018-01-02 DIAGNOSIS — N200 Calculus of kidney: Secondary | ICD-10-CM | POA: Insufficient documentation

## 2018-01-02 DIAGNOSIS — Z96643 Presence of artificial hip joint, bilateral: Secondary | ICD-10-CM | POA: Insufficient documentation

## 2018-01-02 DIAGNOSIS — I700 Atherosclerosis of aorta: Secondary | ICD-10-CM | POA: Diagnosis not present

## 2018-01-02 MED ORDER — IOPAMIDOL (ISOVUE-300) INJECTION 61%
125.0000 mL | Freq: Once | INTRAVENOUS | Status: AC | PRN
  Administered 2018-01-02: 125 mL via INTRAVENOUS

## 2018-01-03 NOTE — Progress Notes (Signed)
Urine cytology showed no evidence of malignant cells.  He did have findings and a cytology indicating a potential source of bleeding from the kidney.  Would recommend a nephrology evaluation.  Order was entered.

## 2018-01-04 NOTE — Progress Notes (Signed)
Patient notified

## 2018-01-05 DIAGNOSIS — G4733 Obstructive sleep apnea (adult) (pediatric): Secondary | ICD-10-CM | POA: Diagnosis not present

## 2018-01-09 DIAGNOSIS — E119 Type 2 diabetes mellitus without complications: Secondary | ICD-10-CM | POA: Diagnosis not present

## 2018-01-09 DIAGNOSIS — Z96641 Presence of right artificial hip joint: Secondary | ICD-10-CM | POA: Diagnosis not present

## 2018-01-09 DIAGNOSIS — Z794 Long term (current) use of insulin: Secondary | ICD-10-CM | POA: Diagnosis not present

## 2018-01-14 DIAGNOSIS — G4733 Obstructive sleep apnea (adult) (pediatric): Secondary | ICD-10-CM | POA: Diagnosis not present

## 2018-01-18 DIAGNOSIS — E119 Type 2 diabetes mellitus without complications: Secondary | ICD-10-CM | POA: Diagnosis not present

## 2018-01-18 DIAGNOSIS — Z794 Long term (current) use of insulin: Secondary | ICD-10-CM | POA: Diagnosis not present

## 2018-01-18 DIAGNOSIS — E669 Obesity, unspecified: Secondary | ICD-10-CM | POA: Diagnosis not present

## 2018-01-18 DIAGNOSIS — E1129 Type 2 diabetes mellitus with other diabetic kidney complication: Secondary | ICD-10-CM | POA: Insufficient documentation

## 2018-01-18 DIAGNOSIS — E1169 Type 2 diabetes mellitus with other specified complication: Secondary | ICD-10-CM | POA: Insufficient documentation

## 2018-01-18 DIAGNOSIS — Z9641 Presence of insulin pump (external) (internal): Secondary | ICD-10-CM | POA: Insufficient documentation

## 2018-02-04 DIAGNOSIS — G4733 Obstructive sleep apnea (adult) (pediatric): Secondary | ICD-10-CM | POA: Diagnosis not present

## 2018-02-05 DIAGNOSIS — G4733 Obstructive sleep apnea (adult) (pediatric): Secondary | ICD-10-CM | POA: Diagnosis not present

## 2018-02-20 ENCOUNTER — Ambulatory Visit: Payer: Medicare HMO | Admitting: Family Medicine

## 2018-03-07 DIAGNOSIS — G4733 Obstructive sleep apnea (adult) (pediatric): Secondary | ICD-10-CM | POA: Diagnosis not present

## 2018-03-21 ENCOUNTER — Encounter: Payer: Self-pay | Admitting: Family Medicine

## 2018-03-21 ENCOUNTER — Ambulatory Visit (INDEPENDENT_AMBULATORY_CARE_PROVIDER_SITE_OTHER): Payer: Medicare HMO | Admitting: Family Medicine

## 2018-03-21 VITALS — BP 117/71 | HR 61 | Wt 317.0 lb

## 2018-03-21 DIAGNOSIS — E785 Hyperlipidemia, unspecified: Secondary | ICD-10-CM

## 2018-03-21 DIAGNOSIS — I10 Essential (primary) hypertension: Secondary | ICD-10-CM | POA: Diagnosis not present

## 2018-03-21 DIAGNOSIS — G4730 Sleep apnea, unspecified: Secondary | ICD-10-CM

## 2018-03-21 DIAGNOSIS — Z23 Encounter for immunization: Secondary | ICD-10-CM | POA: Diagnosis not present

## 2018-03-21 NOTE — Assessment & Plan Note (Signed)
Discussed wt loss 

## 2018-03-21 NOTE — Assessment & Plan Note (Signed)
Uses CPAP every night faithfully with good resolution of daytime drowsiness

## 2018-03-21 NOTE — Progress Notes (Signed)
BP 117/71   Pulse 61   Wt (!) 317 lb (143.8 kg)   SpO2 99%   BMI 40.70 kg/m    Subjective:    Patient ID: Aaron [name], male    DOB: 11/13/1950, 67 y.o.   MRN: [id number]  HPI: Aaron [name] is a 67 y.o. male  Chief Complaint  Patient presents with  . Follow-up  . Hypertension  . Hyperlipidemia  . Diabetes   Patient doing well diabetes last A1c through endocrinology was 6.2 and is done well. Budde pressure good control no issues with blood pressure medications. Cholesterol doing well also with no complaints or issues. Patient still struggling with weight and weight gain.  Is thinking about cutting back on bread as that his weakness.  Relevant past medical, surgical, family and social history reviewed and updated as indicated. Interim medical history since our last visit reviewed. Allergies and medications reviewed and updated.  Review of Systems  Constitutional: Negative.   Respiratory: Negative.   Cardiovascular: Negative.     Per HPI unless specifically indicated above     Objective:    BP 117/71   Pulse 61   Wt (!) 317 lb (143.8 kg)   SpO2 99%   BMI 40.70 kg/m   Wt Readings from Last 3 Encounters:  03/21/18 (!) 317 lb (143.8 kg)  12/27/17 (!) 310 lb 3.2 oz (140.7 kg)  11/27/17 (!) 315 lb 12.8 oz (143.2 kg)    Physical Exam  Constitutional: He is oriented to person, place, and time. He appears well-developed and well-nourished.  HENT:  Head: Normocephalic and atraumatic.  Eyes: Conjunctivae and EOM are normal.  Neck: Normal range of motion.  Cardiovascular: Normal rate, regular rhythm and normal heart sounds.  Pulmonary/Chest: Effort normal and breath sounds normal.  Musculoskeletal: Normal range of motion.  Neurological: He is alert and oriented to person, place, and time.  Skin: No erythema.  Psychiatric: He has a normal mood and affect. His behavior is normal. Judgment and thought content normal.    Results for orders placed or performed  in visit on 12/27/17  Microscopic Examination  Result Value Ref Range   WBC, UA 0-5 0 - 5 /hpf   RBC, UA 3-10 (A) 0 - 2 /hpf   Epithelial Cells (non renal) 0-10 0 - 10 /hpf   Mucus, UA Present (A) Not Estab.   Bacteria, UA Moderate (A) None seen/Few  Urinalysis, Complete  Result Value Ref Range   Specific Gravity, UA >1.030 (H) 1.005 - 1.030   pH, UA 5.5 5.0 - 7.5   Color, UA Yellow Yellow   Appearance Ur Clear Clear   Leukocytes, UA Negative Negative   Protein, UA Trace (A) Negative/Trace   Glucose, UA Negative Negative   Ketones, UA Negative Negative   RBC, UA 1+ (A) Negative   Bilirubin, UA Negative Negative   Urobilinogen, Ur 0.2 0.2 - 1.0 mg/dL   Nitrite, UA Negative Negative   Microscopic Examination See below:       Assessment & Plan:   Problem List Items Addressed This Visit      Cardiovascular and Mediastinum   Hypertension - Primary    The current medical regimen is effective;  continue present plan and medications.       Relevant Medications   ASPIRIN 81 PO   Other Relevant Orders   Basic metabolic panel   LP+ALT+AST Piccolo, Waived   Microalbumin, Urine Waived     Respiratory   Sleep apnea  Uses CPAP every night faithfully with good resolution of daytime drowsiness        Other   Hyperlipidemia    The current medical regimen is effective;  continue present plan and medications.       Relevant Medications   ASPIRIN 81 PO   Other Relevant Orders   Basic metabolic panel   LP+ALT+AST Piccolo, Waived   Microalbumin, Urine Waived   Morbid obesity ([place])    Discussed wt loss       Other Visit Diagnoses    Need for pneumococcal vaccination       Relevant Orders   Pneumococcal conjugate vaccine 13-valent (Completed)       Follow up plan: Return in about 6 months (around 09/19/2018) for Physical Exam.

## 2018-03-21 NOTE — Patient Instructions (Signed)
Pneumococcal Conjugate Vaccine (PCV13) What You Need to Know 1. Why get vaccinated? Vaccination can protect both children and adults from pneumococcal disease. Pneumococcal disease is caused by bacteria that can spread from person to person through close contact. It can cause ear infections, and it can also lead to more serious infections of the:  Lungs (pneumonia),  Blood (bacteremia), and  Covering of the brain and spinal cord (meningitis).  Pneumococcal pneumonia is most common among adults. Pneumococcal meningitis can cause deafness and brain damage, and it kills about 1 child in 10 who get it. Anyone can get pneumococcal disease, but children under 2 years of age and adults 65 years and older, people with certain medical conditions, and cigarette smokers are at the highest risk. Before there was a vaccine, the [place] saw:  more than 700 cases of meningitis,  about 13,000 blood infections,  about 5 million ear infections, and  about 200 deaths  in children under 5 each year from pneumococcal disease. Since vaccine became available, severe pneumococcal disease in these children has fallen by 88%. About 18,000 older adults die of pneumococcal disease each year in the [place]. Treatment of pneumococcal infections with penicillin and other drugs is not as effective as it used to be, because some strains of the disease have become resistant to these drugs. This makes prevention of the disease, through vaccination, even more important. 2. PCV13 vaccine Pneumococcal conjugate vaccine (called PCV13) protects against 13 types of pneumococcal bacteria. PCV13 is routinely given to children at 2, 4, 6, and 12-15 months of age. It is also recommended for children and adults 2 to 64 years of age with certain health conditions, and for all adults 65 years of age and older. Your doctor can give you details. 3. Some people should not get this vaccine Anyone who has ever had a  life-threatening allergic reaction to a dose of this vaccine, to an earlier pneumococcal vaccine called PCV7, or to any vaccine containing diphtheria toxoid (for example, DTaP), should not get PCV13. Anyone with a severe allergy to any component of PCV13 should not get the vaccine. Tell your doctor if the person being vaccinated has any severe allergies. If the person scheduled for vaccination is not feeling well, your healthcare provider might decide to reschedule the shot on another day. 4. Risks of a vaccine reaction With any medicine, including vaccines, there is a chance of reactions. These are usually mild and go away on their own, but serious reactions are also possible. Problems reported following PCV13 varied by age and dose in the series. The most common problems reported among children were:  About half became drowsy after the shot, had a temporary loss of appetite, or had redness or tenderness where the shot was given.  About 1 out of 3 had swelling where the shot was given.  About 1 out of 3 had a mild fever, and about 1 in 20 had a fever over 102.2F.  Up to about 8 out of 10 became fussy or irritable.  Adults have reported pain, redness, and swelling where the shot was given; also mild fever, fatigue, headache, chills, or muscle pain. Young children who get PCV13 along with inactivated flu vaccine at the same time may be at increased risk for seizures caused by fever. Ask your doctor for more information. Problems that could happen after any vaccine:  People sometimes faint after a medical procedure, including vaccination. Sitting or lying down for about 15 minutes can help prevent   fainting, and injuries caused by a fall. Tell your doctor if you feel dizzy, or have vision changes or ringing in the ears.  Some older children and adults get severe pain in the shoulder and have difficulty moving the arm where a shot was given. This happens very rarely.  Any medication can cause a  severe allergic reaction. Such reactions from a vaccine are very rare, estimated at about 1 in a million doses, and would happen within a few minutes to a few hours after the vaccination. As with any medicine, there is a very small chance of a vaccine causing a serious injury or death. The safety of vaccines is always being monitored. For more information, visit: www.cdc.gov/vaccinesafety/ 5. What if there is a serious reaction? What should I look for? Look for anything that concerns you, such as signs of a severe allergic reaction, very high fever, or unusual behavior. Signs of a severe allergic reaction can include hives, swelling of the face and throat, difficulty breathing, a fast heartbeat, dizziness, and weakness-usually within a few minutes to a few hours after the vaccination. What should I do?  If you think it is a severe allergic reaction or other emergency that can't wait, call 9-1-1 or get the person to the nearest hospital. Otherwise, call your doctor.  Reactions should be reported to the Vaccine Adverse Event Reporting System (VAERS). Your doctor should file this report, or you can do it yourself through the VAERS web site at www.vaers.hhs.gov, or by calling 1-800-822-7967. ? VAERS does not give medical advice. 6. The National Vaccine Injury Compensation Program The National Vaccine Injury Compensation Program (VICP) is a federal program that was created to compensate people who may have been injured by certain vaccines. Persons who believe they may have been injured by a vaccine can learn about the program and about filing a claim by calling 1-800-338-2382 or visiting the VICP website at www.hrsa.gov/vaccinecompensation. There is a time limit to file a claim for compensation. 7. How can I learn more?  Ask your healthcare provider. He or she can give you the vaccine package insert or suggest other sources of information.  Call your local or state health department.  Contact the  Centers for Disease Control and Prevention (CDC): ? Call 1-800-232-4636 (1-800-CDC-INFO) or ? Visit CDC's website at www.cdc.gov/vaccines Vaccine Information Statement, PCV13 Vaccine (03/26/2014) This information is not intended to replace advice given to you by your health care provider. Make sure you discuss any questions you have with your health care provider. Document Released: 03/05/2006 Document Revised: 01/27/2016 Document Reviewed: 01/27/2016 Elsevier Interactive Patient Education  2017 Elsevier Inc.  

## 2018-03-21 NOTE — Assessment & Plan Note (Signed)
The current medical regimen is effective;  continue present plan and medications.  

## 2018-03-22 ENCOUNTER — Encounter: Payer: Self-pay | Admitting: Family Medicine

## 2018-03-22 LAB — BASIC METABOLIC PANEL
BUN / CREAT RATIO: 13 (ref 10–24)
BUN: 15 mg/dL (ref 8–27)
CHLORIDE: 100 mmol/L (ref 96–106)
CO2: 25 mmol/L (ref 20–29)
Calcium: 9.2 mg/dL (ref 8.6–10.2)
Creatinine, Ser: 1.17 mg/dL (ref 0.76–1.27)
GFR calc non Af Amer: 65 mL/min/{1.73_m2} (ref >59)
GFR, EST AFRICAN AMERICAN: 75 mL/min/{1.73_m2} (ref >59)
GLUCOSE: 122 mg/dL — AB (ref 65–99)
Potassium: 3.9 mmol/L (ref 3.5–5.2)
SODIUM: 142 mmol/L (ref 134–144)

## 2018-03-22 LAB — LP+ALT+AST PICCOLO, WAIVED
ALT (SGPT) Piccolo, Waived: 17 U/L (ref 10–47)
AST (SGOT) Piccolo, Waived: 21 U/L (ref 11–38)
Chol/HDL Ratio Piccolo,Waive: 2.7 mg/dL
Cholesterol Piccolo, Waived: 78 mg/dL (ref <200)
HDL Chol Piccolo, Waived: 29 mg/dL — ABNORMAL LOW (ref >59)
LDL CHOL CALC PICCOLO WAIVED: 35 mg/dL (ref <100)
Triglycerides Piccolo,Waived: 67 mg/dL (ref <150)
VLDL CHOL CALC PICCOLO,WAIVE: 13 mg/dL (ref <30)

## 2018-03-22 LAB — MICROALBUMIN, URINE WAIVED
Creatinine, Urine Waived: 200 mg/dL (ref 10–300)
MICROALB, UR WAIVED: 30 mg/L — AB (ref 0–19)

## 2018-03-28 ENCOUNTER — Encounter: Payer: Self-pay | Admitting: Family Medicine

## 2018-03-29 ENCOUNTER — Ambulatory Visit (INDEPENDENT_AMBULATORY_CARE_PROVIDER_SITE_OTHER): Payer: Medicare HMO

## 2018-03-29 DIAGNOSIS — Z23 Encounter for immunization: Secondary | ICD-10-CM | POA: Diagnosis not present

## 2018-03-29 NOTE — Patient Instructions (Signed)

## 2018-04-07 DIAGNOSIS — G4733 Obstructive sleep apnea (adult) (pediatric): Secondary | ICD-10-CM | POA: Diagnosis not present

## 2018-04-15 DIAGNOSIS — E119 Type 2 diabetes mellitus without complications: Secondary | ICD-10-CM | POA: Diagnosis not present

## 2018-05-06 ENCOUNTER — Encounter: Payer: Self-pay | Admitting: Family Medicine

## 2018-05-06 DIAGNOSIS — G4733 Obstructive sleep apnea (adult) (pediatric): Secondary | ICD-10-CM | POA: Diagnosis not present

## 2018-05-07 DIAGNOSIS — G4733 Obstructive sleep apnea (adult) (pediatric): Secondary | ICD-10-CM | POA: Diagnosis not present

## 2018-06-07 DIAGNOSIS — G4733 Obstructive sleep apnea (adult) (pediatric): Secondary | ICD-10-CM | POA: Diagnosis not present

## 2018-07-08 DIAGNOSIS — G4733 Obstructive sleep apnea (adult) (pediatric): Secondary | ICD-10-CM | POA: Diagnosis not present

## 2018-07-14 ENCOUNTER — Other Ambulatory Visit: Payer: Self-pay | Admitting: Family Medicine

## 2018-07-14 DIAGNOSIS — E7800 Pure hypercholesterolemia, unspecified: Secondary | ICD-10-CM

## 2018-07-14 DIAGNOSIS — M1A0720 Idiopathic chronic gout, left ankle and foot, without tophus (tophi): Secondary | ICD-10-CM

## 2018-07-16 DIAGNOSIS — E119 Type 2 diabetes mellitus without complications: Secondary | ICD-10-CM | POA: Diagnosis not present

## 2018-07-16 DIAGNOSIS — Z794 Long term (current) use of insulin: Secondary | ICD-10-CM | POA: Diagnosis not present

## 2018-07-17 DIAGNOSIS — Z7984 Long term (current) use of oral hypoglycemic drugs: Secondary | ICD-10-CM | POA: Diagnosis not present

## 2018-07-17 DIAGNOSIS — H524 Presbyopia: Secondary | ICD-10-CM | POA: Diagnosis not present

## 2018-07-17 DIAGNOSIS — H5203 Hypermetropia, bilateral: Secondary | ICD-10-CM | POA: Diagnosis not present

## 2018-07-17 DIAGNOSIS — H2513 Age-related nuclear cataract, bilateral: Secondary | ICD-10-CM | POA: Diagnosis not present

## 2018-07-17 DIAGNOSIS — E119 Type 2 diabetes mellitus without complications: Secondary | ICD-10-CM | POA: Diagnosis not present

## 2018-07-17 DIAGNOSIS — H52223 Regular astigmatism, bilateral: Secondary | ICD-10-CM | POA: Diagnosis not present

## 2018-07-17 LAB — HM DIABETES EYE EXAM

## 2018-07-23 DIAGNOSIS — Z794 Long term (current) use of insulin: Secondary | ICD-10-CM | POA: Diagnosis not present

## 2018-07-23 DIAGNOSIS — E1169 Type 2 diabetes mellitus with other specified complication: Secondary | ICD-10-CM | POA: Diagnosis not present

## 2018-07-23 DIAGNOSIS — E119 Type 2 diabetes mellitus without complications: Secondary | ICD-10-CM | POA: Diagnosis not present

## 2018-07-23 DIAGNOSIS — E669 Obesity, unspecified: Secondary | ICD-10-CM | POA: Diagnosis not present

## 2018-07-23 DIAGNOSIS — Z9641 Presence of insulin pump (external) (internal): Secondary | ICD-10-CM | POA: Diagnosis not present

## 2018-08-05 DIAGNOSIS — G4733 Obstructive sleep apnea (adult) (pediatric): Secondary | ICD-10-CM | POA: Diagnosis not present

## 2018-08-06 DIAGNOSIS — G4733 Obstructive sleep apnea (adult) (pediatric): Secondary | ICD-10-CM | POA: Diagnosis not present

## 2018-08-19 ENCOUNTER — Ambulatory Visit: Payer: Medicare HMO

## 2018-08-19 ENCOUNTER — Encounter: Payer: Self-pay | Admitting: Family Medicine

## 2018-08-19 ENCOUNTER — Other Ambulatory Visit: Payer: Self-pay

## 2018-08-19 ENCOUNTER — Ambulatory Visit (INDEPENDENT_AMBULATORY_CARE_PROVIDER_SITE_OTHER): Payer: Medicare HMO | Admitting: Family Medicine

## 2018-08-19 DIAGNOSIS — M1A0720 Idiopathic chronic gout, left ankle and foot, without tophus (tophi): Secondary | ICD-10-CM

## 2018-08-19 DIAGNOSIS — I10 Essential (primary) hypertension: Secondary | ICD-10-CM | POA: Diagnosis not present

## 2018-08-19 DIAGNOSIS — E1169 Type 2 diabetes mellitus with other specified complication: Secondary | ICD-10-CM

## 2018-08-19 DIAGNOSIS — E7800 Pure hypercholesterolemia, unspecified: Secondary | ICD-10-CM | POA: Diagnosis not present

## 2018-08-19 MED ORDER — HYDROCHLOROTHIAZIDE 25 MG PO TABS: 25.0000 mg | ORAL_TABLET | Freq: Every day | ORAL | 3 refills | Status: DC

## 2018-08-19 MED ORDER — ALLOPURINOL 300 MG PO TABS: 300.0000 mg | ORAL_TABLET | Freq: Every day | ORAL | 3 refills | Status: DC

## 2018-08-19 MED ORDER — CARVEDILOL 25 MG PO TABS: 25.0000 mg | ORAL_TABLET | Freq: Two times a day (BID) | ORAL | 4 refills | Status: DC

## 2018-08-19 MED ORDER — ATORVASTATIN CALCIUM 20 MG PO TABS: 20.0000 mg | ORAL_TABLET | Freq: Every day | ORAL | 4 refills | Status: DC

## 2018-08-19 MED ORDER — AMLODIPINE BESYLATE 10 MG PO TABS: 10.0000 mg | ORAL_TABLET | Freq: Every day | ORAL | 3 refills | Status: DC

## 2018-08-19 MED ORDER — LOSARTAN POTASSIUM 100 MG PO TABS: 100.0000 mg | ORAL_TABLET | Freq: Every day | ORAL | 4 refills | Status: DC

## 2018-08-19 NOTE — Assessment & Plan Note (Signed)
Followed by endocrinology and stable

## 2018-08-19 NOTE — Assessment & Plan Note (Signed)
The current medical regimen is effective;  continue present plan and medications.  

## 2018-08-19 NOTE — Progress Notes (Signed)
BP 121/74   Temp 97.9 F (36.6 C)   Wt (!) 315 lb (142.9 kg)   BMI 40.44 kg/m    Subjective:    Patient ID: Aaron [name], male    DOB: June 21, 1950, 68 y.o.   MRN: [id number]  HPI: Aaron [name] is a 68 y.o. male  Chief Complaint  Patient presents with  . Annual Exam  . Hypertension  Telemedicine using audio and telecommunications for a synchronous communication visit. Today's visit due to COVID-19 isolation precautions I connected with [name] and verified that I am speaking with the correct person using two identifiers.   I discussed the limitations, risks, security and privacy concerns of performing an evaluation and management service by telephone and the availability of in person appointments. I also discussed with the patient that there may be a patient responsible charge related to this service. The patient expressed understanding and agreed to proceed. The patient's location is home I am at home. Discussed with patient diabetes followed by endocrinology with insulin polyps doing well hemoglobin A1c was 7.1 at last month. Blood pressure doing well with no concerns good blood pressure control. Cholesterol doing well without problems. No gout signs or symptoms doing well on allopurinol.  Relevant past medical, surgical, family and social history reviewed and updated as indicated. Interim medical history since our last visit reviewed. Allergies and medications reviewed and updated.  Review of Systems  Constitutional: Negative.   Respiratory: Negative.   Cardiovascular: Negative.     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       Relevant Medications   amLODipine (NORVASC) 10 MG tablet   losartan (COZAAR) 100 MG tablet   hydrochlorothiazide (HYDRODIURIL) 25 MG tablet   carvedilol (COREG) 25 MG tablet   carvedilol (COREG) 25 MG tablet   hydrochlorothiazide (HYDRODIURIL) 25 MG tablet   losartan (COZAAR) 100 MG tablet   amLODipine (NORVASC) 10 MG tablet   atorvastatin (LIPITOR) 20 MG tablet     Endocrine   Diabetes mellitus associated with hormonal etiology ([place])    Followed by endocrinology and stable      Relevant Medications   losartan (COZAAR) 100 MG tablet   losartan (COZAAR) 100 MG tablet   atorvastatin (LIPITOR) 20 MG tablet     Other   Hyperlipidemia    The current medical regimen is effective;  continue present plan and medications.       Relevant Medications   amLODipine (NORVASC) 10 MG tablet   losartan (COZAAR) 100 MG tablet   hydrochlorothiazide (HYDRODIURIL) 25 MG tablet   carvedilol (COREG) 25 MG tablet   carvedilol (COREG) 25 MG tablet   hydrochlorothiazide (HYDRODIURIL) 25 MG tablet  losartan (COZAAR) 100 MG tablet   amLODipine (NORVASC) 10 MG tablet   atorvastatin (LIPITOR) 20 MG tablet   Gout    The current medical regimen is effective;  continue present plan and medications.       Morbid obesity ([place])    Discussed weight loss.        Other Visit Diagnoses    Essential hypertension, benign       Relevant Medications   amLODipine (NORVASC) 10 MG tablet   losartan (COZAAR) 100 MG tablet   hydrochlorothiazide (HYDRODIURIL) 25 MG tablet   carvedilol (COREG) 25 MG tablet   carvedilol (COREG) 25 MG tablet   hydrochlorothiazide (HYDRODIURIL) 25 MG tablet    losartan (COZAAR) 100 MG tablet   amLODipine (NORVASC) 10 MG tablet   atorvastatin (LIPITOR) 20 MG tablet     I discussed the assessment and treatment plan with the patient. The patient was provided an opportunity to ask questions and all were answered. The patient agreed with the plan and demonstrated an understanding of the instructions.   The patient was advised to call back or seek an in-person evaluation if the symptoms worsen or if the condition fails to improve as anticipated.   I provided 21+ minutes of time during this encounter.  Follow up plan: Return in about 3 months (around 11/19/2018).

## 2018-08-19 NOTE — Assessment & Plan Note (Signed)
Discussed weight loss. 

## 2018-09-06 DIAGNOSIS — G4733 Obstructive sleep apnea (adult) (pediatric): Secondary | ICD-10-CM | POA: Diagnosis not present

## 2018-10-17 DIAGNOSIS — H524 Presbyopia: Secondary | ICD-10-CM | POA: Diagnosis not present

## 2018-10-17 DIAGNOSIS — H52223 Regular astigmatism, bilateral: Secondary | ICD-10-CM | POA: Diagnosis not present

## 2018-10-23 ENCOUNTER — Ambulatory Visit (INDEPENDENT_AMBULATORY_CARE_PROVIDER_SITE_OTHER): Payer: Medicare HMO

## 2018-10-23 DIAGNOSIS — E1169 Type 2 diabetes mellitus with other specified complication: Secondary | ICD-10-CM | POA: Diagnosis not present

## 2018-10-23 DIAGNOSIS — Z0000 Encounter for general adult medical examination without abnormal findings: Secondary | ICD-10-CM | POA: Diagnosis not present

## 2018-10-23 NOTE — Progress Notes (Signed)
Subjective:   Aaron [name] is a 68 y.o. male who presents for an Initial Medicare Annual Wellness Visit.  This visit is being conducted via phone call  - after an attmept to do on video chat - due to the COVID-19 pandemic. This patient has given me verbal consent via phone to conduct this visit, patient states they are participating from their home address. Some vital signs may be absent or patient reported.   Patient identification: identified by name, DOB, and current address.    Review of Systems  Cardiac Risk Factors include: advanced age (>21men, >78 women);hypertension;dyslipidemia;diabetes mellitus;male gender;obesity (BMI >30kg/m2)    Objective:    Today's Vitals   10/23/18 0934  PainSc: 4    There is no height or weight on file to calculate BMI.  Advanced Directives 10/23/2018 11/27/2017 11/27/2017 11/14/2017 06/11/2016  Does Patient Have a Medical Advance Directive? No No No No No  Would patient like information on creating a medical advance directive? - No - Patient declined No - Patient declined No - Patient declined -    Current Medications (verified) Outpatient Encounter Medications as of 10/23/2018  Medication Sig  . acetaminophen (TYLENOL) 500 MG tablet Take 1-2 tablets (500-1,000 mg total) by mouth every 6 (six) hours as needed for mild pain (pain score 1-3 or temp > 100.5).  Marland Kitchen allopurinol (ZYLOPRIM) 300 MG tablet Take 1 tablet (300 mg total) by mouth daily.  Marland Kitchen amLODipine (NORVASC) 10 MG tablet Take 1 tablet (10 mg total) by mouth daily.  . ASPIRIN 81 PO   . atorvastatin (LIPITOR) 20 MG tablet Take 1 tablet (20 mg total) by mouth daily.  . BD INSULIN SYRINGE ULTRAFINE 31G X 15/64" 1 ML MISC USE AS DIRECTED  . carvedilol (COREG) 25 MG tablet Take 1 tablet (25 mg total) by mouth 2 (two) times daily with a meal.  . hydrochlorothiazide (HYDRODIURIL) 25 MG tablet Take 1 tablet (25 mg total) by mouth daily.  . insulin aspart (NOVOLOG FLEXPEN) 100 UNIT/ML FlexPen Inject  into the skin. 60 units 3 times a day  . Insulin Degludec (TRESIBA FLEXTOUCH) 200 UNIT/ML SOPN Inject into the skin. Using 70 units once a day  . losartan (COZAAR) 100 MG tablet Take 1 tablet (100 mg total) by mouth daily.  . [DISCONTINUED] amLODipine (NORVASC) 10 MG tablet Take 10 mg by mouth daily.  . [DISCONTINUED] aspirin EC 325 MG EC tablet Take 1 tablet (325 mg total) by mouth daily with breakfast. (Patient not taking: Reported on 10/23/2018)  . [DISCONTINUED] carvedilol (COREG) 25 MG tablet Take 25 mg by mouth 2 (two) times daily with a meal.  . [DISCONTINUED] hydrochlorothiazide (HYDRODIURIL) 25 MG tablet Take 25 mg by mouth daily.  . [DISCONTINUED] Insulin Human (INSULIN PUMP) SOLN Inject into the skin. HUMALOG 100 UNIT/ML injection  USE UP TO 300 UNITS D VIA INSULIN PUMP  . [DISCONTINUED] losartan (COZAAR) 100 MG tablet Take 100 mg by mouth daily.   No facility-administered encounter medications on file as of 10/23/2018.     Allergies (verified) Glucophage [metformin]   History: Past Medical History:  Diagnosis Date  . Cancer [facility]) 2013   prostate (radiation and surgery)  . Diabetes mellitus without complication ([place])   . Gout   . Hyperlipidemia   . Hypertension   . Sleep apnea    Past Surgical History:  Procedure Laterality Date  . CIRCUMCISION    . CYST EXCISION Left 2015   arm  . deviated septrum  2003  .  JOINT REPLACEMENT Left 2016   Hip  . PROSTATE SURGERY     prostatectomy due to cancer  . PROSTATECTOMY  2013  . TONSILLECTOMY AND ADENOIDECTOMY  2004  . TOTAL HIP ARTHROPLASTY Left 2016  . TOTAL HIP ARTHROPLASTY Right 11/27/2017   Procedure: TOTAL HIP ARTHROPLASTY ANTERIOR APPROACH;  Surgeon: Hessie Knows, MD;  Location: [facility] ORS;  Service: Orthopedics;  Laterality: Right;   Family History  Problem Relation Age of Onset  . Cancer Mother   . Breast cancer Mother   . Stroke Father   . Cancer Sister   . Cancer Maternal Uncle   . Stroke Maternal Uncle     Social History   Socioeconomic History  . Marital status: Married    Spouse name: Not on file  . Number of children: Not on file  . Years of education: Not on file  . Highest education level: Some college, no degree  Occupational History  . Occupation: retired  Scientific laboratory technician  . Financial resource strain: Not hard at all  . Food insecurity:    Worry: Never true    Inability: Never true  . Transportation needs:    Medical: No    Non-medical: No  Tobacco Use  . Smoking status: Never Smoker  . Smokeless tobacco: Never Used  Substance and Sexual Activity  . Alcohol use: No  . Drug use: No  . Sexual activity: Yes  Lifestyle  . Physical activity:    Days per week: 0 days    Minutes per session: 0 min  . Stress: Not at all  Relationships  . Social connections:    Talks on phone: More than three times a week    Gets together: More than three times a week    Attends religious service: More than 4 times per year    Active member of club or organization: No    Attends meetings of clubs or organizations: Never    Relationship status: Married  Other Topics Concern  . Not on file  Social History Narrative  . Not on file   Tobacco Counseling Counseling given: Not Answered   Clinical Intake:  Pre-visit preparation completed: Yes  Pain : 0-10 Pain Score: 4  Pain Type: Acute pain Pain Location: Arm Pain Orientation: Right Pain Descriptors / Indicators: Aching Pain Onset: In the past 7 days(happened monday when trying to start lawn mower ) Pain Frequency: Intermittent     Nutritional Status: BMI > 30  Obese Nutritional Risks: None Diabetes: Yes CBG done?: No Did pt. bring in CBG monitor from home?: No  How often do you need to have someone help you when you read instructions, pamphlets, or other written materials from your doctor or pharmacy?: 1 - Never  Nutrition Risk Assessment:  Has the patient had any N/V/D within the last 2 months?  No  Does the patient have  any non-healing wounds?  No  Has the patient had any unintentional weight loss or weight gain?  No   Diabetes:  Is the patient diabetic?  Yes  If diabetic, was a CBG obtained today?  No  Did the patient bring in their glucometer from home?  No  How often do you monitor your CBG's? 4 times a day   Financial Strains and Diabetes Management:  Are you having any financial strains with the device, your supplies or your medication? Yes, patient wants to be back on his insulin pump but it cost 1100 every 90 days. States it managed his diabetes  very well though. Does the patient want to be seen by Chronic Care Management for management of their diabetes?  No  Would the patient like to be referred to a Nutritionist or for Diabetic Management?  No   Diabetic Exams:  Diabetic Eye Exam: Completed 07/17/2018. Completed by Dr.Nice   Diabetic Foot Exam: Completed 03/21/2018. Done by Dr.Solums office   Interpreter Needed?: No  Information entered by :: Hale Chalfin,LPN   Activities of Daily Living In your present state of health, do you have any difficulty performing the following activities: 10/23/2018 08/19/2018  Hearing? N N  Vision? N N  Difficulty concentrating or making decisions? N N  Walking or climbing stairs? Y N  Comment has had 2 hip replacements  -  Dressing or bathing? N N  Doing errands, shopping? N N  Preparing Food and eating ? N -  Using the Toilet? N -  In the past six months, have you accidently leaked urine? N -  Do you have problems with loss of bowel control? N -  Managing your Medications? N -  Managing your Finances? N -  Housekeeping or managing your Housekeeping? N -  Some recent data might be hidden     Immunizations and Health Maintenance Immunization History  Administered Date(s) Administered  . Influenza, High Dose Seasonal PF 03/29/2018  . Influenza,inj,Quad PF,6+ Mos 07/11/2016, 02/15/2017  . Influenza-Unspecified 03/26/2015  . Pneumococcal Conjugate-13  03/21/2018  . Pneumococcal-Unspecified 10/20/1992, 02/20/1999  . Tdap 05/31/2011  . Zoster 07/11/2016   Health Maintenance Due  Topic Date Due  . HEMOGLOBIN A1C  02/15/2018    Patient Care Team: [name], MD as PCP - General (Family Medicine) Gabriel Carina, Betsey Holiday, MD as Consulting Physician (Endocrinology)  Indicate any recent Medical Services you may have received from other than [facility] providers in the past year (date may be approximate).    Assessment:   This is a routine wellness examination for [place].  Hearing/Vision screen Vision Screening Comments: Goes to Dr.Nice annually   Dietary issues and exercise activities discussed: Current Exercise Habits: The patient does not participate in regular exercise at present, Exercise limited by: orthopedic condition(s)  Goals   None    Depression Screen PHQ 2/9 Scores 10/23/2018 08/19/2018 08/15/2017 02/15/2017  PHQ - 2 Score 0 0 0 0  PHQ- 9 Score - 0 0 -    Fall Risk Fall Risk  10/23/2018 08/19/2018 08/15/2017 02/15/2017 07/11/2016  Falls in the past year? 0 0 No No No  Follow up - Falls evaluation completed - - -    FALL RISK PREVENTION PERTAINING TO THE HOME:  Any stairs in or around the home? Yes  If so, are there any without handrails? No   Home free of loose throw rugs in walkways, pet beds, electrical cords, etc? Yes  Adequate lighting in your home to reduce risk of falls? Yes   ASSISTIVE DEVICES UTILIZED TO PREVENT FALLS:  Life alert? No  Use of a cane, walker or w/c? No  Grab bars in the bathroom? No  Shower chair or bench in shower? No  Elevated toilet seat or a handicapped toilet? No    TIMED UP AND GO:  Unable to perform   Cognitive Function:     6CIT Screen 10/23/2018  What Year? 0 points  What month? 0 points  What time? 0 points  Count back from 20 0 points  Months in reverse 0 points  Repeat phrase 0 points  Total Score 0  Screening Tests Health Maintenance  Topic Date Due  . HEMOGLOBIN A1C   02/15/2018  . INFLUENZA VACCINE  12/21/2018  . FOOT EXAM  03/22/2019  . PNA vac Low Risk Adult (2 of 2 - PPSV23) 03/22/2019  . OPHTHALMOLOGY EXAM  07/18/2019  . COLONOSCOPY  06/13/2020  . TETANUS/TDAP  05/30/2021  . Hepatitis C Screening  Completed    Qualifies for Shingles Vaccine? Yes  Zostavax completed unknown date. Due for Shingrix. Education has been provided regarding the importance of this vaccine. Pt has been advised to call insurance company to determine out of pocket expense. Advised may also receive vaccine at local pharmacy or Health Dept. Verbalized acceptance and understanding.  Tdap: up to date   Flu Vaccine: up to date   Pneumococcal Vaccine: up to date   Cancer Screenings:  Colorectal Screening: Completed 06/13/2010. Repeat every 10 years  Lung Cancer Screening: (Low Dose CT Chest recommended if Age 71-80 years, 30 pack-year currently smoking OR have quit w/in 15years.) does not qualify.     Additional Screening:  Hepatitis C Screening: does qualify; Completed 04/26/2015  Vision Screening: Recommended annual ophthalmology exams for early detection of glaucoma and other disorders of the eye. Is the patient up to date with their annual eye exam?  Yes  Who is the provider or what is the name of the office in which the pt attends annual eye exams? Dr.Nice   Dental Screening: Recommended annual dental exams for proper oral hygiene  Community Resource Referral:  CRR required this visit?  No   CCM referral sent for diabetic medication assistance       Plan:  I have personally reviewed and addressed the Medicare Annual Wellness questionnaire and have noted the following in the patient's chart:  A. Medical and social history B. Use of alcohol, tobacco or illicit drugs  C. Current medications and supplements D. Functional ability and status E.  Nutritional status F.  Physical activity G. Advance directives H. List of other physicians I.  Hospitalizations,  surgeries, and ER visits in previous 12 months J.  [place] such as hearing and vision if needed, cognitive and depression L. Referrals and appointments   In addition, I have reviewed and discussed with patient certain preventive protocols, quality metrics, and best practice recommendations. A written personalized care plan for preventive services as well as general preventive health recommendations were provided to patient.   Signed,    Bevelyn Ngo, LPN   09/[birth year]84  Nurse Health Advisor    Nurse Notes: none

## 2018-10-23 NOTE — Patient Instructions (Addendum)
[name] , Thank you for taking time to come for your Medicare Wellness Visit. I appreciate your ongoing commitment to your health goals. Please review the following plan we discussed and let me know if I can assist you in the future.   Screening recommendations/referrals: Colonoscopy: completed 05/23/2010 Recommended yearly ophthalmology/optometry visit for glaucoma screening and checkup Recommended yearly dental visit for hygiene and checkup  Vaccinations: Influenza vaccine: up to date Pneumococcal vaccine: up to date, pneumovax 23 due 03/2019 Tdap vaccine: up to date Shingles vaccine: shingrix eligible, check with your insurance company for coverage   Advanced directives: Please let me know if you need any assistance with this or have any questions.   Conditions/risks identified: diabetic- someone will contact you from our Chronic Care Management team to discuss diabetic medication assistance with you  Next appointment: Follow up in one year for your annual wellness exam.   Preventive Care 65 Years and Older, Male Preventive care refers to lifestyle choices and visits with your health care provider that can promote health and wellness. What does preventive care include?  A yearly physical exam. This is also called an annual well check.  Dental exams once or twice a year.  Routine eye exams. Ask your health care provider how often you should have your eyes checked.  Personal lifestyle choices, including:  Daily care of your teeth and gums.  Regular physical activity.  Eating a healthy diet.  Avoiding tobacco and drug use.  Limiting alcohol use.  Practicing safe sex.  Taking low doses of aspirin every day.  Taking vitamin and mineral supplements as recommended by your health care provider. What happens during an annual well check? The services and screenings done by your health care provider during your annual well check will depend on your age, overall health,  lifestyle risk factors, and family history of disease. Counseling  Your health care provider may ask you questions about your:  Alcohol use.  Tobacco use.  Drug use.  Emotional well-being.  Home and relationship well-being.  Sexual activity.  Eating habits.  History of falls.  Memory and ability to understand (cognition).  Work and work Statistician. Screening  You may have the following tests or measurements:  Height, weight, and BMI.  Blood pressure.  Lipid and cholesterol levels. These may be checked every 5 years, or more frequently if you are over 27 years old.  Skin check.  Lung cancer screening. You may have this screening every year starting at age 60 if you have a 30-pack-year history of smoking and currently smoke or have quit within the past 15 years.  Fecal occult blood test (FOBT) of the stool. You may have this test every year starting at age 35.  Flexible sigmoidoscopy or colonoscopy. You may have a sigmoidoscopy every 5 years or a colonoscopy every 10 years starting at age 56.  Prostate cancer screening. Recommendations will vary depending on your family history and other risks.  Hepatitis C blood test.  Hepatitis B blood test.  Sexually transmitted disease (STD) testing.  Diabetes screening. This is done by checking your blood sugar (glucose) after you have not eaten for a while (fasting). You may have this done every 1-3 years.  Abdominal aortic aneurysm (AAA) screening. You may need this if you are a current or former smoker.  Osteoporosis. You may be screened starting at age 9 if you are at high risk. Talk with your health care provider about your test results, treatment options, and if necessary, the  need for more tests. Vaccines  Your health care provider may recommend certain vaccines, such as:  Influenza vaccine. This is recommended every year.  Tetanus, diphtheria, and acellular pertussis (Tdap, Td) vaccine. You may need a Td booster  every 10 years.  Zoster vaccine. You may need this after age 2.  Pneumococcal 13-valent conjugate (PCV13) vaccine. One dose is recommended after age 49.  Pneumococcal polysaccharide (PPSV23) vaccine. One dose is recommended after age 64. Talk to your health care provider about which screenings and vaccines you need and how often you need them. This information is not intended to replace advice given to you by your health care provider. Make sure you discuss any questions you have with your health care provider. Document Released: 06/04/2015 Document Revised: 01/26/2016 Document Reviewed: 03/09/2015 Elsevier Interactive Patient Education  2017 [place] Prevention in the Home Falls can cause injuries. They can happen to people of all ages. There are many things you can do to make your home safe and to help prevent falls. What can I do on the outside of my home?  Regularly fix the edges of walkways and driveways and fix any cracks.  Remove anything that might make you trip as you walk through a door, such as a raised step or threshold.  Trim any bushes or trees on the path to your home.  Use bright outdoor lighting.  Clear any walking paths of anything that might make someone trip, such as rocks or tools.  Regularly check to see if handrails are loose or broken. Make sure that both sides of any steps have handrails.  Any raised decks and porches should have guardrails on the edges.  Have any leaves, snow, or ice cleared regularly.  Use sand or salt on walking paths during winter.  Clean up any spills in your garage right away. This includes oil or grease spills. What can I do in the bathroom?  Use night lights.  Install grab bars by the toilet and in the tub and shower. Do not use towel bars as grab bars.  Use non-skid mats or decals in the tub or shower.  If you need to sit down in the shower, use a plastic, non-slip stool.  Keep the floor dry. Clean up any  water that spills on the floor as soon as it happens.  Remove soap buildup in the tub or shower regularly.  Attach bath mats securely with double-sided non-slip rug tape.  Do not have throw rugs and other things on the floor that can make you trip. What can I do in the bedroom?  Use night lights.  Make sure that you have a light by your bed that is easy to reach.  Do not use any sheets or blankets that are too big for your bed. They should not hang down onto the floor.  Have a firm chair that has side arms. You can use this for support while you get dressed.  Do not have throw rugs and other things on the floor that can make you trip. What can I do in the kitchen?  Clean up any spills right away.  Avoid walking on wet floors.  Keep items that you use a lot in easy-to-reach places.  If you need to reach something above you, use a strong step stool that has a grab bar.  Keep electrical cords out of the way.  Do not use floor polish or wax that makes floors slippery. If you must use wax,  use non-skid floor wax.  Do not have throw rugs and other things on the floor that can make you trip. What can I do with my stairs?  Do not leave any items on the stairs.  Make sure that there are handrails on both sides of the stairs and use them. Fix handrails that are broken or loose. Make sure that handrails are as long as the stairways.  Check any carpeting to make sure that it is firmly attached to the stairs. Fix any carpet that is loose or worn.  Avoid having throw rugs at the top or bottom of the stairs. If you do have throw rugs, attach them to the floor with carpet tape.  Make sure that you have a light switch at the top of the stairs and the bottom of the stairs. If you do not have them, ask someone to add them for you. What else can I do to help prevent falls?  Wear shoes that:  Do not have high heels.  Have rubber bottoms.  Are comfortable and fit you well.  Are closed  at the toe. Do not wear sandals.  If you use a stepladder:  Make sure that it is fully opened. Do not climb a closed stepladder.  Make sure that both sides of the stepladder are locked into place.  Ask someone to hold it for you, if possible.  Clearly mark and make sure that you can see:  Any grab bars or handrails.  First and last steps.  Where the edge of each step is.  Use tools that help you move around (mobility aids) if they are needed. These include:  Canes.  Walkers.  Scooters.  Crutches.  Turn on the lights when you go into a dark area. Replace any light bulbs as soon as they burn out.  Set up your furniture so you have a clear path. Avoid moving your furniture around.  If any of your floors are uneven, fix them.  If there are any pets around you, be aware of where they are.  Review your medicines with your doctor. Some medicines can make you feel dizzy. This can increase your chance of falling. Ask your doctor what other things that you can do to help prevent falls. This information is not intended to replace advice given to you by your health care provider. Make sure you discuss any questions you have with your health care provider. Document Released: 03/04/2009 Document Revised: 10/14/2015 Document Reviewed: 06/12/2014 Elsevier Interactive Patient Education  2017 [employer].

## 2018-10-29 ENCOUNTER — Ambulatory Visit (INDEPENDENT_AMBULATORY_CARE_PROVIDER_SITE_OTHER): Payer: Medicare HMO | Admitting: Pharmacist

## 2018-10-29 DIAGNOSIS — E1169 Type 2 diabetes mellitus with other specified complication: Secondary | ICD-10-CM

## 2018-10-29 DIAGNOSIS — E113293 Type 2 diabetes mellitus with mild nonproliferative diabetic retinopathy without macular edema, bilateral: Secondary | ICD-10-CM | POA: Diagnosis not present

## 2018-10-29 DIAGNOSIS — E669 Obesity, unspecified: Secondary | ICD-10-CM | POA: Diagnosis not present

## 2018-10-29 DIAGNOSIS — E119 Type 2 diabetes mellitus without complications: Secondary | ICD-10-CM | POA: Diagnosis not present

## 2018-10-29 DIAGNOSIS — Z794 Long term (current) use of insulin: Secondary | ICD-10-CM | POA: Diagnosis not present

## 2018-10-29 DIAGNOSIS — E1165 Type 2 diabetes mellitus with hyperglycemia: Secondary | ICD-10-CM | POA: Diagnosis not present

## 2018-10-29 NOTE — Patient Instructions (Signed)
Visit Information  Goals Addressed            This Visit's Progress     Patient Stated   . "I cant afford my insulin"  (pt-stated)       Current Barriers:  . Financial Barriers - patient states that he cannot afford his Tresiba and Novolog  o Previously was on insulin pump therapy with Novolog, but was transitioned to Tresiba + Novolog for financial reasons.  o A1c 7.1% at endocrinology in 06/2018, though generally <7% on pump therapy.  . Patient prefers switching back to using an insulin pump as he states it is has been difficult for him to self-administer his insulin multiple times a day.   Pharmacist Clinical Goal(s):  . Over the next 14 days, patient will work with patient to address needs related to medication access   Interventions: . Comprehensive medication review . Patient qualifies for Novo Nordisk patient assistance program.  . Contacted endocrinology to determine whether to apply for Tresiba + Novolog pens or Novolog vials to transition back to pump therapy. Spoke with Jessica at Dr. Solum's office; they are going to schedule an appointment with the patient this week to discuss financial options and determine next steps.   Patient Self Care Activities:  . Self administers medications as prescribed  Please see past updates related to this goal by clicking on the "Past Updates" button in the selected goal         Aaron [name] was given information about Chronic Care Management services today including:  1. CCM service includes personalized support from designated clinical staff supervised by his physician, including individualized plan of care and coordination with other care providers 2. 24/7 contact phone numbers for assistance for urgent and routine care needs. 3. Service will only be billed when office clinical staff spend 20 minutes or more in a month to coordinate care. 4. Only one practitioner may furnish and bill the service in a calendar month. 5. The patient  may stop CCM services at any time (effective at the end of the month) by phone call to the office staff. 6. The patient will be responsible for cost sharing (co-pay) of up to 20% of the service fee (after annual deductible is met).  Patient agreed to services and verbal consent obtained.   The patient verbalized understanding of instructions provided today and declined a print copy of patient instruction materials.    Plan: - PharmD will contacted patient in 2-3 business days to see what was decided upon at endocrinology visit regarding medication access.   Catie Travis, PharmD Clinical Pharmacist [name] [phone]   

## 2018-10-29 NOTE — Chronic Care Management (AMB) (Signed)
**Note Aaron-Identified via Obfuscation** Chronic Care Management   Note  10/29/2018 Name: Aaron [name] MRN: [id number] DOB: [birth year]   Subjective:  Aaron [name] is a 68 y.o. year old male who is a primary care patient of [name], [name], MD. The CCM team was consulted for assistance with chronic disease management and care coordination needs.    Received referral from [facility] regarding patient having affordability concerns with insulin.  Aaron [name] was given information about Chronic Care Management services today including:  1. CCM service includes personalized support from designated clinical staff supervised by his physician, including individualized plan of care and coordination with other care providers 2. 24/7 contact phone numbers for assistance for urgent and routine care needs. 3. Service will only be billed when office clinical staff spend 20 minutes or more in a month to coordinate care. 4. Only one practitioner may furnish and bill the service in a calendar month. 5. The patient may stop CCM services at any time (effective at the end of the month) by phone call to the office staff. 6. The patient will be responsible for cost sharing (co-pay) of up to 20% of the service fee (after annual deductible is met).  Patient agreed to services and verbal consent obtained.   Review of patient status, including review of consultants reports, laboratory and other test data, was performed as part of comprehensive evaluation and provision of chronic care management services.   Objective:  Lab Results  Component Value Date   CREATININE 1.17 03/21/2018   CREATININE 1.30 (H) 11/30/2017   CREATININE 1.52 (H) 11/29/2017    Lab Results  Component Value Date   HGBA1C 7.6 08/15/2017       Component Value Date/Time   CHOL 78 03/21/2018 1103   TRIG 67 03/21/2018 1103   HDL 33 (L) 08/15/2017 0858   CHOLHDL 3.1 08/15/2017 0858   VLDL 13 03/21/2018 1103   LDLCALC 49 08/15/2017 0858    Clinical ASCVD: No       BP Readings from Last 3 Encounters:  08/19/18 121/74  03/21/18 117/71  12/27/17 (!) 173/93    Allergies  Allergen Reactions  . Glucophage [Metformin] Diarrhea    Medications Reviewed Today    Reviewed by Aaron [name], [facility] (Pharmacist) on 10/29/18 at [phone]  Med List Status: <None>  Medication Order Taking? Sig Documenting Provider Last Dose Status Informant  acetaminophen (TYLENOL) 500 MG tablet [id number] Yes Take 1-2 tablets (500-1,000 mg total) by mouth every 6 (six) hours as needed for mild pain (pain score 1-3 or temp > 100.5). Aaron Guess, PA-C Taking Active   allopurinol (ZYLOPRIM) 300 MG tablet [id number] Yes Take 1 tablet (300 mg total) by mouth daily. Aaron Maple, MD Taking Active   amLODipine (NORVASC) 10 MG tablet [id number] Yes Take 1 tablet (10 mg total) by mouth daily. Aaron Maple, MD Taking Active   ASPIRIN 81 PO [id number] Yes  [provider] Taking Active   atorvastatin (LIPITOR) 20 MG tablet [id number] Yes Take 1 tablet (20 mg total) by mouth daily. Aaron Maple, MD Taking Active   BD INSULIN SYRINGE ULTRAFINE 31G X 15/64" 1 ML MISC [id number] Yes USE AS DIRECTED Aaron Maple, MD Taking Active Self  carvedilol (COREG) 25 MG tablet [id number] Yes Take 1 tablet (25 mg total) by mouth 2 (two) times daily with a meal. Aaron Maple, MD Taking Active   hydrochlorothiazide (HYDRODIURIL) 25 MG tablet [id number] Yes Take 1 tablet (25 mg total) by  mouth daily. Aaron Maple, MD Taking Active   insulin aspart (NOVOLOG FLEXPEN) 100 UNIT/ML FlexPen [id number] Yes Inject into the skin. 60 units 3 times a day [provider] Taking Active   Insulin Degludec (TRESIBA FLEXTOUCH) 200 UNIT/ML SOPN [id number] Yes Inject into the skin. Using 70 units once a day [provider] Taking Active   losartan (COZAAR) 100 MG tablet [id number] Yes Take 1 tablet (100 mg total) by mouth daily. Aaron Maple, MD Taking Active             Assessment:   Goals Addressed            This Visit's Progress     Patient Stated   . "I cant afford my insulin"  (pt-stated)       Current Barriers:  . Financial Barriers - patient states that he cannot afford his [place] and Novolog  o Previously was on insulin pump therapy with Novolog, but was transitioned to [place] + Novolog for financial reasons.  o A1c 7.1% at endocrinology in 06/2018, though generally <7% on pump therapy.  . Patient prefers switching back to using an insulin pump as he states it is has been difficult for him to self-administer his insulin multiple times a day.   Pharmacist Clinical Goal(s):  Aaron [name] Kitchen Over the next 14 days, patient will work with patient to address needs related to medication access   Interventions: . Comprehensive medication review . Patient qualifies for [name] patient assistance program.  . Contacted endocrinology to determine whether to apply for [place] + Novolog pens or Novolog vials to transition back to pump therapy. Spoke with Aaron [name] at Dr. Joycie Peek office; they are going to schedule an appointment with the patient this week to discuss financial options and determine next steps.   Patient Self Care Activities:  . Self administers medications as prescribed  Please see past updates related to this goal by clicking on the "Past Updates" button in the selected goal         Plan: - PharmD will contacted patient in 2-3 business days to see what was decided upon at endocrinology visit regarding medication access.   Aaron [name], PharmD Clinical Pharmacist [place] [phone]

## 2018-11-01 ENCOUNTER — Ambulatory Visit: Payer: Self-pay | Admitting: Pharmacist

## 2018-11-01 DIAGNOSIS — E1169 Type 2 diabetes mellitus with other specified complication: Secondary | ICD-10-CM

## 2018-11-01 NOTE — Patient Instructions (Signed)
Visit Information  Goals Addressed            This Visit's Progress     Patient Stated   . "I cant afford my insulin"  (pt-stated)       Current Barriers:  . Financial Barriers - patient states that he cannot afford his [place] and Novolog  o Previously was on insulin pump therapy with Novolog, but was transitioned to [place] + Novolog for financial reasons.  o A1c 7.1% at endocrinology in 06/2018, though generally <7% on pump therapy.  . Patient prefers switching back to using an insulin pump as he states it is has been difficult for him to self-administer his insulin multiple times a day.  . Patient had appointment with Dr. Gabriel Carina earlier this week and discussed patient assistance.   Pharmacist Clinical Goal(s):  Marland Kitchen Over the next 14 days, patient will work with patient to address needs related to medication access   Interventions: . Dr. Gabriel Carina discussed patient assistance process with the patient, but he asked for my assistance in coordination and submission. Printed and mailed the patient portion of the application to the patient, and faxed provider portion to Dr. Joycie Peek office.  . Called Dr. Joycie Peek office to discuss whether to apply for [place] + Novolog pens, or Novolog vials, as patient would prefer to transition back to pump therapy. Spoke with Janett Billow, RN with Dr. Gabriel Carina. She will speak with Dr. Gabriel Carina and get back to me on Monday with the plan . Will collaborate with Susy Frizzle, CPhT for submission and follow up  Patient Self Care Activities:  . Self administers medications as prescribed  Please see past updates related to this goal by clicking on the "Past Updates" button in the selected goal         The patient verbalized understanding of instructions provided today and declined a print copy of patient instruction materials.   Plan:  - PharmD will follow up with Dr. Joycie Peek office end of next week, if I have not heard anything back regarding patient assistance for Mr.  Bloodworth.  - Will follow up with patient in 7-10 business days to ensure he received patient portion of [place] application  Catie Irwin, PharmD Clinical Pharmacist [place] [phone]

## 2018-11-01 NOTE — Chronic Care Management (AMB) (Signed)
  Chronic Care Management   Follow Up Note   11/01/2018 Name: Aaron [name] MRN: [id number] DOB: 1950/06/09  Referred by: Aaron Maple, MD Reason for referral : No chief complaint on file.   Aaron [name] is a 68 y.o. year old male who is a primary care patient of [name], [name], MD. The CCM team was consulted for assistance with chronic disease management and care coordination needs.    Contacted patient to follow up on the plan for his insulin s/p endocrinology appointment yesterday.   Review of patient status, including review of consultants reports, relevant laboratory and other test results, and collaboration with appropriate care team members and the patient's provider was performed as part of comprehensive patient evaluation and provision of chronic care management services.    Goals Addressed            This Visit's Progress     Patient Stated   . "I cant afford my insulin"  (pt-stated)       Current Barriers:  . Financial Barriers - patient states that he cannot afford his [place] and Novolog  o Previously was on insulin pump therapy with Novolog, but was transitioned to [place] + Novolog for financial reasons.  o A1c 7.1% at endocrinology in 06/2018, though generally <7% on pump therapy.  . Patient prefers switching back to using an insulin pump as he states it is has been difficult for him to self-administer his insulin multiple times a day.  . Patient had appointment with Dr. Gabriel Carina earlier this week and discussed patient assistance.   Pharmacist Clinical Goal(s):  Marland Kitchen Over the next 14 days, patient will work with patient to address needs related to medication access   Interventions: . Dr. Gabriel Carina discussed patient assistance process with the patient, but he asked for my assistance in coordination and submission. Printed and mailed the patient portion of the application to the patient, and faxed provider portion to Dr. Joycie Peek office.  . Called Dr. Joycie Peek office  to discuss whether to apply for [place] + Novolog pens, or Novolog vials, as patient would prefer to transition back to pump therapy. Spoke with Janett Billow, RN with Dr. Gabriel Carina. She will speak with Dr. Gabriel Carina and get back to me on Monday with the plan . Will collaborate with Susy Frizzle, CPhT for submission and follow up  Patient Self Care Activities:  . Self administers medications as prescribed  Please see past updates related to this goal by clicking on the "Past Updates" button in the selected goal          Plan:  - PharmD will follow up with Dr. Joycie Peek office end of next week, if I have not heard anything back regarding patient assistance for Mr. Pehl.  - Will follow up with patient in 7-10 business days to ensure he received patient portion of [place] application  Aaron [name], PharmD Clinical Pharmacist [place] [phone]

## 2018-11-05 DIAGNOSIS — G4733 Obstructive sleep apnea (adult) (pediatric): Secondary | ICD-10-CM | POA: Diagnosis not present

## 2018-11-13 ENCOUNTER — Other Ambulatory Visit: Payer: Self-pay | Admitting: Pharmacy Technician

## 2018-11-13 NOTE — Patient Outreach (Signed)
[facility]) Care Management  11/13/2018  Aaron [name] Sep 30, 1950 [id number]  Successful outreach call placed to patient in regards to [name] application for [employer].  Spoke to patient, HIPPA identifers verified.  Patient informed he had received the application and had mailed them back in on Monday 11/11/2018.  Will followup with patient in 10-14 business days if application has not been received.  Wolfe Camarena P. Prerna Harold, [place] Management [phone]

## 2018-11-20 ENCOUNTER — Ambulatory Visit: Payer: Self-pay | Admitting: Pharmacist

## 2018-11-20 DIAGNOSIS — E1169 Type 2 diabetes mellitus with other specified complication: Secondary | ICD-10-CM

## 2018-11-20 NOTE — Chronic Care Management (AMB) (Signed)
  Chronic Care Management   Follow Up Note   11/20/2018 Name: Aaron [name] MRN: [id number] DOB: [birth year]  Referred by: Aaron Maple, MD Reason for referral : Chronic Care Management (Medication Management)   Aaron [name] is a 68 y.o. year old male who is a primary care patient of [name], [name], MD. The CCM team was consulted for assistance with chronic disease management and care coordination needs.    Received call from patient today regarding medication assistance.   Review of patient status, including review of consultants reports, relevant laboratory and other test results, and collaboration with appropriate care team members and the patient's provider was performed as part of comprehensive patient evaluation and provision of chronic care management services.    Goals Addressed            This Visit's Progress     Patient Stated   . "I can't afford my insulin" (pt-stated)       Current Barriers:  . Financial Barriers - patient states that he cannot afford his [place] and Novolog  o Previously was on insulin pump therapy with Novolog, but was transitioned to [place] + Novolog for financial reasons.  o A1c 7.1% at endocrinology in 06/2018, though generally <7% on pump therapy.  . Working on [name] patient assistance for [name] for patient to transition back to pump - received Dr. Gabriel [name] portion, waiting on patient portion to arrive in the mail (he mailed on 11/11/2018)  Pharmacist Clinical Goal(s):  Marland Kitchen Over the next 14 days, patient will work with patient to address needs related to medication access   Interventions: . Spoke with Aaron [name], CPhT. She has not received anything in the mail yet from the patient, but is typically to take 7-10 business days after mailing to get to her. Will continue to look out for this, and if we need to, can reprint patient portion of applications and have him physically bring to the office. Patient verbalizes  understanding.   Patient Self Care Activities:  . Self administers medications as prescribed  Please see past updates related to this goal by clicking on the "Past Updates" button in the selected goal         Plan:  - Will continue to collaborate with patient and Aaron [name] regarding patient assistance. Will outreach patient in the next 4-6 weeks for continued support  Aaron [name], PharmD Clinical Pharmacist [place] [phone]

## 2018-11-20 NOTE — Patient Instructions (Signed)
Visit Information  Goals Addressed            This Visit's Progress     Patient Stated   . "I can't afford my insulin" (pt-stated)       Current Barriers:  . Financial Barriers - patient states that he cannot afford his [place] and Novolog  o Previously was on insulin pump therapy with Novolog, but was transitioned to [place] + Novolog for financial reasons.  o A1c 7.1% at endocrinology in 06/2018, though generally <7% on pump therapy.  . Working on [name] patient assistance for [employer] for patient to transition back to pump - received Dr. Gabriel Carina portion, waiting on patient portion to arrive in the mail (he mailed on 11/11/2018)  Pharmacist Clinical Goal(s):  Marland Kitchen Over the next 14 days, patient will work with patient to address needs related to medication access   Interventions: . Spoke with [employer], CPhT. She has not received anything in the mail yet from the patient, but is typically to take 7-10 business days after mailing to get to her. Will continue to look out for this, and if we need to, can reprint patient portion of applications and have him physically bring to the office. Patient verbalizes understanding.   Patient Self Care Activities:  . Self administers medications as prescribed  Please see past updates related to this goal by clicking on the "Past Updates" button in the selected goal         The patient verbalized understanding of instructions provided today and declined a print copy of patient instruction materials.     Plan:  - Will continue to collaborate with patient and [facility] pharmacy technician regarding patient assistance. Will outreach patient in the next 4-6 weeks for continued support  [name], PharmD Clinical Pharmacist [place] [phone]

## 2018-11-29 ENCOUNTER — Ambulatory Visit (INDEPENDENT_AMBULATORY_CARE_PROVIDER_SITE_OTHER): Payer: Medicare HMO | Admitting: Pharmacist

## 2018-11-29 DIAGNOSIS — E1169 Type 2 diabetes mellitus with other specified complication: Secondary | ICD-10-CM

## 2018-11-29 NOTE — Patient Instructions (Signed)
Visit Information  Goals Addressed            This Visit's Progress     Patient Stated   . "I can't afford my insulin" (pt-stated)       Current Barriers:  . Financial Barriers - patient states that he cannot afford his [place] and Novolog  o Previously was on insulin pump therapy with Novolog, but was transitioned to [place] + Novolog for financial reasons.  o A1c 7.1% at endocrinology in 06/2018, though generally <7% on pump therapy.  . Working on [name] patient assistance for [employer] for patient to transition back to pump - received Dr. Gabriel Carina portion, waiting on patient portion to arrive in the mail (he mailed on 11/11/2018) . As of 7/10, patient portion has not been received at [facility] office  Pharmacist Clinical Goal(s):  Marland Kitchen Over the next 30 days, patient will work with patient to address needs related to medication access   Interventions: . As we have not received patient portion of applications in the mail yet, will re-print applications and leave at the front desk of [facility] for him to come by and sign. He notes he will also drop off income information.  . Have given instruction to front desk staff to then send the above paperwork to [employer], CPhT for submission and follow up  Patient Self Care Activities:  . Self administers medications as prescribed  Please see past updates related to this goal by clicking on the "Past Updates" button in the selected goal         The patient verbalized understanding of instructions provided today and declined a print copy of patient instruction materials.   Plan:  - Will collaborate with Susy Frizzle, CPhT as above - Will follow up with patient regarding medication access in 4-5 weeks  [name], PharmD Clinical Pharmacist [place] [phone]

## 2018-11-29 NOTE — Chronic Care Management (AMB) (Signed)
  Chronic Care Management   Follow Up Note   11/29/2018 Name: Aaron [name] MRN: [id number] DOB: 1950-08-21  Referred by: [name], MD Reason for referral : No chief complaint on file.   Aaron [name] is a 68 y.o. year old male who is a primary care patient of [name], [name], MD. The CCM team was consulted for assistance with chronic disease management and care coordination needs.    Received call from patient today, inquiring if we had received his patient assistance application materials in the mail.  Review of patient status, including review of consultants reports, relevant laboratory and other test results, and collaboration with appropriate care team members and the patient's provider was performed as part of comprehensive patient evaluation and provision of chronic care management services.    Goals Addressed            This Visit's Progress     Patient Stated   . "I can't afford my insulin" (pt-stated)       Current Barriers:  . Financial Barriers - patient states that he cannot afford his [place] and Novolog  o Previously was on insulin pump therapy with Novolog, but was transitioned to [place] + Novolog for financial reasons.  o A1c 7.1% at endocrinology in 06/2018, though generally <7% on pump therapy.  . Working on [name] patient assistance for [employer] for patient to transition back to pump - received Dr. Gabriel Carina portion, waiting on patient portion to arrive in the mail (he mailed on 11/11/2018) . As of 7/10, patient portion has not been received at [facility] office  Pharmacist Clinical Goal(s):  Marland Kitchen Over the next 30 days, patient will work with patient to address needs related to medication access   Interventions: . As we have not received patient portion of applications in the mail yet, will re-print applications and leave at the front desk of [facility] for him to come by and sign. He notes he will also drop off income information.  .  Have given instruction to front desk staff to then send the above paperwork to [employer], CPhT for submission and follow up  Patient Self Care Activities:  . Self administers medications as prescribed  Please see past updates related to this goal by clicking on the "Past Updates" button in the selected goal          Plan:  - Will collaborate with Susy Frizzle, CPhT as above - Will follow up with patient regarding medication access in 4-5 weeks  [name], PharmD Clinical Pharmacist [place] [phone]

## 2018-12-04 ENCOUNTER — Other Ambulatory Visit: Payer: Self-pay | Admitting: Pharmacy Technician

## 2018-12-04 NOTE — Patient Outreach (Signed)
[facility]) Care Management  12/04/2018  Aaron [name] March 04, 1951 [id number]  Successful outreach call placed to patient in regards to [name] application for [employer].  Spoke to patient, HIPPA identifiers verified.  Patient informed he was able to go by the office to sign the forms and drop off financial as instructed by [facility] [name].  Will await return of documents to [facility]. Will followup with [place] if documents not received in 3-7 business days.  Zani Kyllonen P. Keela Rubert, [place] Management [phone]

## 2018-12-05 ENCOUNTER — Other Ambulatory Visit: Payer: Self-pay | Admitting: Pharmacy Technician

## 2018-12-05 NOTE — Patient Outreach (Signed)
[facility] [facility]) Care Management  12/05/2018  Aaron [name] 23-Oct-1950 [id number]    Received all necessary documents and signatures from both provider and patient for [name] patient assistance for Novolog.  Submitted completed application via fax.  Will followup with [name] in 2-5 business days.  Jestina Stephani P. Crespin Forstrom, [place] Management [phone]

## 2018-12-11 ENCOUNTER — Other Ambulatory Visit: Payer: Self-pay | Admitting: Pharmacy Technician

## 2018-12-11 ENCOUNTER — Ambulatory Visit: Payer: Self-pay | Admitting: Pharmacist

## 2018-12-11 DIAGNOSIS — E1169 Type 2 diabetes mellitus with other specified complication: Secondary | ICD-10-CM

## 2018-12-11 NOTE — Patient Outreach (Signed)
[facility]) Care Management  12/11/2018  Aaron [name] 02-25-1951 [id number]   Care coordination call placed to [place] patient assistance in regards to patient's application for Novolog.  Spoke to [place] who informed patient had been APPROVED for the program 12/11/2018-05/22/2019.  Louretha informed patient would be receiving 32 vials and the medication should ship out in 10-14 business days arriving at the provider's office.  Will followup with patient in 14-21 business days to inquire if he has received the medication.  Latron Ribas P. Jushua Waltman, [place] Management [phone]

## 2018-12-11 NOTE — Chronic Care Management (AMB) (Signed)
  Chronic Care Management   Follow Up Note   12/11/2018 Name: Aaron [name] MRN: [id number] DOB: November 22, 1950  Referred by: Aaron Maple, MD Reason for referral : Chronic Care Management (Medication Management)   [name] is a 68 y.o. year old male who is a primary care patient of Aaron, [name], MD. The CCM team was consulted for assistance with chronic disease management and care coordination needs.    Received call from patient today inquiring about status of Novolog application  Review of patient status, including review of consultants reports, relevant laboratory and other test results, and collaboration with appropriate care team members and the patient's provider was performed as part of comprehensive patient evaluation and provision of chronic care management services.    Goals Addressed            This Visit's Progress     Patient Stated   . "I can't afford my insulin" (pt-stated)       Current Barriers:  . Financial Barriers - patient states that he cannot afford his [place] and Novolog  o Previously was on insulin pump therapy with Novolog, but was transitioned to [place] + Novolog for financial reasons.  o A1c 7.1% at endocrinology in 06/2018, though generally <7% on pump therapy.  . Working on [name] patient assistance for [employer] for patient to transition back to pump  Pharmacist Clinical Goal(s):  Marland Kitchen Over the next 30 days, patient will work with patient to address needs related to medication access   Interventions: . Received message from [facility], CPhT that patient was APPROVED for Novolog patient assistance. 32 vials will be shipped to Dr. Joycie Peek office in the next 10-14 business days . Contacted Dr. Joycie Peek office, left message for her nurse to let them know this will be coming. They should also receive a fax directly from [name] . Sharee Pimple Simcox will call patient to inform and discuss [name] to order refills   Patient Self Care  Activities:  . Self administers medications as prescribed  Please see past updates related to this goal by clicking on the "Past Updates" button in the selected goal         Plan:  - Will outreach patient in 2-4 weeks to follow up on status of receiving Novolog and transition back to pump therapy  [name], PharmD Clinical Pharmacist [place] [phone]

## 2018-12-11 NOTE — Patient Instructions (Signed)
Visit Information  Goals Addressed            This Visit's Progress     Patient Stated   . "I can't afford my insulin" (pt-stated)       Current Barriers:  . Financial Barriers - patient states that he cannot afford his [place] and Novolog  o Previously was on insulin pump therapy with Novolog, but was transitioned to [place] + Novolog for financial reasons.  o A1c 7.1% at endocrinology in 06/2018, though generally <7% on pump therapy.  . Working on [name] patient assistance for [employer] for patient to transition back to pump  Pharmacist Clinical Goal(s):  Marland Kitchen Over the next 30 days, patient will work with patient to address needs related to medication access   Interventions: . Received message from [facility], CPhT that patient was APPROVED for Novolog patient assistance. 32 vials will be shipped to Dr. Joycie Peek office in the next 10-14 business days . Contacted Dr. Joycie Peek office, left message for her nurse to let them know this will be coming. They should also receive a fax directly from [name] . Sharee Pimple Simcox will call patient to inform and discuss how to order refills   Patient Self Care Activities:  . Self administers medications as prescribed  Please see past updates related to this goal by clicking on the "Past Updates" button in the selected goal         Print copy of patient instructions provided.  Plan:  - Will outreach patient in 2-4 weeks to follow up on status of receiving Novolog and transition back to pump therapy  [name], PharmD Clinical Pharmacist [place] [phone]

## 2018-12-19 DIAGNOSIS — E119 Type 2 diabetes mellitus without complications: Secondary | ICD-10-CM | POA: Diagnosis not present

## 2018-12-30 ENCOUNTER — Other Ambulatory Visit: Payer: Self-pay | Admitting: Pharmacy Technician

## 2018-12-30 NOTE — Patient Outreach (Signed)
[facility]) Care Management  12/30/2018  Aaron [name] 1950-05-28 [id number]    Successful outreach call placed to patient in regards to [name] application for [employer].  Spoke to patient, HIPAA identifiers verified.  Patient informed he had received 32 vials of Novolog. Discussed refill procedure with the patient and he verbalized understanding.Refills have to be done through the provider's office, in this case Dr. Joycie Peek office.  Patient inquired if there were any companies that could help offset the cost of Contour Next Test Strips. Will route this question to embedded [facility] RPh Catie [place].  Will rote note to embedded [facility] RPh [name] that patient assistance is complete and will remove myself from care team.  Aaron [name]. Aaron [name], Aaron [name] Management [phone]

## 2019-01-01 ENCOUNTER — Ambulatory Visit (INDEPENDENT_AMBULATORY_CARE_PROVIDER_SITE_OTHER): Payer: Medicare HMO | Admitting: Pharmacist

## 2019-01-01 DIAGNOSIS — E1169 Type 2 diabetes mellitus with other specified complication: Secondary | ICD-10-CM | POA: Diagnosis not present

## 2019-01-01 NOTE — Patient Instructions (Signed)
Visit Information  Goals Addressed            This Visit's Progress     Patient Stated   . "I can't afford my insulin" (pt-stated)       Current Barriers:  . Financial Barriers - patient states that he cannot afford his [place] and Novolog  o Previously was on insulin pump therapy with Novolog, but was transitioned to [place] + Novolog for financial reasons.  o A1c 7.1% at endocrinology in 06/2018, though generally <7% on pump therapy.  . Received message from Susy Frizzle that patient was APPROVED for Novo assistance  Pharmacist Clinical Goal(s):  Marland Kitchen Over the next 90 days, patient will work with patient to address needs related to medication access   Interventions: . Contacted patient; he confirms he received Novolog and is back on insulin pump therapy, and much prefers this.  . Does ask if there are any assistance programs for Contour strips - he notes that this brand isn't covered by his insurance, but the Contour meter "talks" to his insulin pump. Will investigate if there are any options to help with strip cost.   Patient Self Care Activities:  . Self administers medications as prescribed  Please see past updates related to this goal by clicking on the "Past Updates" button in the selected goal         The patient verbalized understanding of instructions provided today and declined a print copy of patient instruction materials.   Plan:  - Will investigate options for Contour strip cost assistance  - Will outreach patient in 4-6 weeks for continued medication management support  [name], PharmD Clinical Pharmacist [place] [phone]

## 2019-01-01 NOTE — Chronic Care Management (AMB) (Signed)
  Chronic Care Management   Follow Up Note   01/01/2019 Name: Aaron [name] MRN: [id number] DOB: Apr 22, 1951  Referred by: Aaron Maple, MD Reason for referral : Chronic Care Management (Medication Management)   Aaron [name] is a 68 y.o. year old male who is a primary care patient of [name], [name], MD. The CCM team was consulted for assistance with chronic disease management and care coordination needs.    Contacted patient to follow up on medication management support.   Review of patient status, including review of consultants reports, relevant laboratory and other test results, and collaboration with appropriate care team members and the patient's provider was performed as part of comprehensive patient evaluation and provision of chronic care management services.    Goals Addressed            This Visit's Progress     Patient Stated   . "I can't afford my insulin" (pt-stated)       Current Barriers:  . Financial Barriers - patient states that he cannot afford his [place] and Novolog  o Previously was on insulin pump therapy with Novolog, but was transitioned to [place] + Novolog for financial reasons.  o A1c 7.1% at endocrinology in 06/2018, though generally <7% on pump therapy.  . Received message from Aaron [name] that patient was APPROVED for Novo assistance  Pharmacist Clinical Goal(s):  Marland Kitchen Over the next 90 days, patient will work with patient to address needs related to medication access   Interventions: . Contacted patient; he confirms he received Novolog and is back on insulin pump therapy, and much prefers this.  . Does ask if there are any assistance programs for Contour strips - he notes that this brand isn't covered by his insurance, but the Contour meter "talks" to his insulin pump. Will investigate if there are any options to help with strip cost.   Patient Self Care Activities:  . Self administers medications as prescribed  Please see past updates  related to this goal by clicking on the "Past Updates" button in the selected goal          Plan:  - Will investigate options for Contour strip cost assistance  - Will outreach patient in 4-6 weeks for continued medication management support  Aaron [name], PharmD Clinical Pharmacist Aaron [name] [phone]

## 2019-01-02 ENCOUNTER — Ambulatory Visit: Payer: Self-pay | Admitting: Pharmacist

## 2019-01-02 DIAGNOSIS — E1169 Type 2 diabetes mellitus with other specified complication: Secondary | ICD-10-CM

## 2019-01-02 NOTE — Chronic Care Management (AMB) (Signed)
  Chronic Care Management   Follow Up Note   01/02/2019 Name: Aaron [name] MRN: [id number] DOB: 1950/11/21  Referred by: [name], MD Reason for referral : No chief complaint on file.   Aaron [name] is a 68 y.o. year old male who is a primary care patient of [name], [name], MD. The CCM team was consulted for assistance with chronic disease management and care coordination needs.    Contacted patient telephonically to follow up on diabetic supplies cost concerns.   Review of patient status, including review of consultants reports, relevant laboratory and other test results, and collaboration with appropriate care team members and the patient's provider was performed as part of comprehensive patient evaluation and provision of chronic care management services.    Outpatient Encounter Medications as of 01/02/2019  Medication Sig  . acetaminophen (TYLENOL) 500 MG tablet Take 1-2 tablets (500-1,000 mg total) by mouth every 6 (six) hours as needed for mild pain (pain score 1-3 or temp > 100.5).  Marland Kitchen allopurinol (ZYLOPRIM) 300 MG tablet Take 1 tablet (300 mg total) by mouth daily.  Marland Kitchen amLODipine (NORVASC) 10 MG tablet Take 1 tablet (10 mg total) by mouth daily.  . ASPIRIN 81 PO   . atorvastatin (LIPITOR) 20 MG tablet Take 1 tablet (20 mg total) by mouth daily.  . BD INSULIN SYRINGE ULTRAFINE 31G X 15/64" 1 ML MISC USE AS DIRECTED  . carvedilol (COREG) 25 MG tablet Take 1 tablet (25 mg total) by mouth 2 (two) times daily with a meal.  . hydrochlorothiazide (HYDRODIURIL) 25 MG tablet Take 1 tablet (25 mg total) by mouth daily.  Marland Kitchen losartan (COZAAR) 100 MG tablet Take 1 tablet (100 mg total) by mouth daily.   No facility-administered encounter medications on file as of 01/02/2019.     Goals Addressed            This Visit's Progress     Patient Stated   . "I can't afford my insulin" (pt-stated)       Current Barriers:  . Financial Barriers - RESOLVED in regard to  medications; patient approved for Novolog vials from [facility], and is back on Medtronic pump therapy. However, he notes that he is having to pay out of pocket for Contour testing supplies, as this is the brand that connects with his pump. [employer] does not cover Contour. Notes that Dr. Gabriel Carina had tried to appeal for medical necessity with [facility], but this was denied.   Pharmacist Clinical Goal(s):  Marland Kitchen Over the next 90 days, patient will work with patient to address needs related to medication access   Interventions: . Contacted patient; he notes that he was attempting to fill supplies on Part D; it may be that the supplies can be covered on Part B, as they go along with pump. Will further investigate this by contacting a Medtronic rep.   Patient Self Care Activities:  . Self administers medications as prescribed  Please see past updates related to this goal by clicking on the "Past Updates" button in the selected goal          Plan:  - Will continue to investigate medication access concern above.   [name], PharmD Clinical Pharmacist [place] [phone]

## 2019-01-02 NOTE — Patient Instructions (Signed)
Visit Information  Goals Addressed            This Visit's Progress     Patient Stated   . "I can't afford my insulin" (pt-stated)       Current Barriers:  . Financial Barriers - RESOLVED in regard to medications; patient approved for Novolog vials from [facility], and is back on Medtronic pump therapy. However, he notes that he is having to pay out of pocket for Contour testing supplies, as this is the brand that connects with his pump. [employer] does not cover Contour. Notes that Dr. Gabriel Carina had tried to appeal for medical necessity with [facility], but this was denied.   Pharmacist Clinical Goal(s):  Marland Kitchen Over the next 90 days, patient will work with patient to address needs related to medication access   Interventions: . Contacted patient; he notes that he was attempting to fill supplies on Part D; it may be that the supplies can be covered on Part B, as they go along with pump. Will further investigate this by contacting a Medtronic rep.   Patient Self Care Activities:  . Self administers medications as prescribed  Please see past updates related to this goal by clicking on the "Past Updates" button in the selected goal         The patient verbalized understanding of instructions provided today and declined a print copy of patient instruction materials.   Plan:  - Will continue to investigate medication access concern above.   [name], PharmD Clinical Pharmacist [place] [phone]

## 2019-02-03 DIAGNOSIS — E1165 Type 2 diabetes mellitus with hyperglycemia: Secondary | ICD-10-CM | POA: Diagnosis not present

## 2019-02-05 ENCOUNTER — Ambulatory Visit (INDEPENDENT_AMBULATORY_CARE_PROVIDER_SITE_OTHER): Payer: Medicare HMO | Admitting: Pharmacist

## 2019-02-05 DIAGNOSIS — E1169 Type 2 diabetes mellitus with other specified complication: Secondary | ICD-10-CM | POA: Diagnosis not present

## 2019-02-05 DIAGNOSIS — G4733 Obstructive sleep apnea (adult) (pediatric): Secondary | ICD-10-CM | POA: Diagnosis not present

## 2019-02-05 NOTE — Patient Instructions (Signed)
Visit Information  Goals Addressed            This Visit's Progress     Patient Stated   . "I can't afford my insulin" (pt-stated)       Current Barriers:  . Financial Barriers - RESOLVED in regard to medications; patient approved for Novolog vials from [facility], and is back on Medtronic pump therapy. . However, endorses concerns with having to pay out of pocket for testing supplies, as [employer] does not cover the One Touch supplies that connect with his Medtronic pump o Outreached Med[employer] rep; she noted that supplies should also be covered under Medicare Part B  Pharmacist Clinical Goal(s):  Marland Kitchen Over the next 90 days, patient will work with patient to address needs related to medication access   Interventions: . Contacted patient; explained that he should talk with Dr. Joycie Peek office about sending test strips to be filled under Part B  Patient Self Care Activities:  . Self administers medications as prescribed  Please see past updates related to this goal by clicking on the "Past Updates" button in the selected goal      . "I want to stay healthy" (pt-stated)       Current Barriers:  . Diabetes: controlled; most recent A1c 6.8% o Much improved with restarting Medtronic pump therapy   . Current antihyperglycemic regimen: Humalog via pump . Cardiovascular risk reduction: o Current hypertensive regimen: amlodipine 10 mg daily, carvedilol 25 mg BID, losartan 100 mg daily o Current hyperlipidemia regimen: atorvastatin 20 mg daily; LDL at goal <70 on last check  Pharmacist Clinical Goal(s):  Marland Kitchen Over the next 90 days, patient with work with PharmD and primary care provider to address maintained glycemic control  Interventions: . Comprehensive medication review performed, medication list updated in electronic medical record . Patient confirms continued medication adherence; patient up to date on fills for atorvastatin and losartan  . Confirms he has appointment w/ Dr. Gabriel Carina next  week.  Patient Self Care Activities:  . Patient will check blood glucose regularly, document, and provide at future appointments . Patient will take medications as prescribed . Patient will report any questions or concerns to provider   Initial goal documentation        The patient verbalized understanding of instructions provided today and declined a print copy of patient instruction materials.   Plan:  - Will outreach patient in the next 6-8 weeks for continued medication management support  [name], PharmD Clinical Pharmacist [place] [phone]

## 2019-02-05 NOTE — Chronic Care Management (AMB) (Signed)
Chronic Care Management   Follow Up Note   02/05/2019 Name: Aaron [name] MRN: [id number] DOB: 1951-05-07  Referred by: [name], MD Reason for referral : Chronic Care Management (Medication Management )   Aaron [name] is a 68 y.o. year old male who is a primary care patient of [name], [name], MD. The CCM team was consulted for assistance with chronic disease management and care coordination needs.    Contacted patient for medication management follow up.  Review of patient status, including review of consultants reports, relevant laboratory and other test results, and collaboration with appropriate care team members and the patient's provider was performed as part of comprehensive patient evaluation and provision of chronic care management services.    SDOH (Social Determinants of Health) screening performed today: Financial Strain . See Care Plan for related entries.   Advanced Directives Status: N See Care Plan and [employer] application for related entries.  Outpatient Encounter Medications as of 02/05/2019  Medication Sig  . acetaminophen (TYLENOL) 500 MG tablet Take 1-2 tablets (500-1,000 mg total) by mouth every 6 (six) hours as needed for mild pain (pain score 1-3 or temp > 100.5).  Marland Kitchen allopurinol (ZYLOPRIM) 300 MG tablet Take 1 tablet (300 mg total) by mouth daily.  Marland Kitchen amLODipine (NORVASC) 10 MG tablet Take 1 tablet (10 mg total) by mouth daily.  . ASPIRIN 81 PO   . atorvastatin (LIPITOR) 20 MG tablet Take 1 tablet (20 mg total) by mouth daily.  . BD INSULIN SYRINGE ULTRAFINE 31G X 15/64" 1 ML MISC USE AS DIRECTED  . carvedilol (COREG) 25 MG tablet Take 1 tablet (25 mg total) by mouth 2 (two) times daily with a meal.  . hydrochlorothiazide (HYDRODIURIL) 25 MG tablet Take 1 tablet (25 mg total) by mouth daily.  Marland Kitchen losartan (COZAAR) 100 MG tablet Take 1 tablet (100 mg total) by mouth daily.   No facility-administered encounter medications on file as of 02/05/2019.       Goals Addressed            This Visit's Progress     Patient Stated   . "I can't afford my insulin" (pt-stated)       Current Barriers:  . Financial Barriers - RESOLVED in regard to medications; patient approved for Novolog vials from [facility], and is back on Medtronic pump therapy. . However, endorses concerns with having to pay out of pocket for testing supplies, as [employer] does not cover the One Touch supplies that connect with his Medtronic pump o Outreached Med[employer] rep; she noted that supplies should also be covered under Medicare Part B  Pharmacist Clinical Goal(s):  Marland Kitchen Over the next 90 days, patient will work with patient to address needs related to medication access   Interventions: . Contacted patient; explained that he should talk with Dr. Joycie Peek office about sending test strips to be filled under Part B  Patient Self Care Activities:  . Self administers medications as prescribed  Please see past updates related to this goal by clicking on the "Past Updates" button in the selected goal      . "I want to stay healthy" (pt-stated)       Current Barriers:  . Diabetes: controlled; most recent A1c 6.8% o Much improved with restarting Medtronic pump therapy   . Current antihyperglycemic regimen: Humalog via pump . Cardiovascular risk reduction: o Current hypertensive regimen: amlodipine 10 mg daily, carvedilol 25 mg BID, losartan 100 mg daily o Current hyperlipidemia regimen:  atorvastatin 20 mg daily; LDL at goal <70 on last check  Pharmacist Clinical Goal(s):  Marland Kitchen Over the next 90 days, patient with work with PharmD and primary care provider to address maintained glycemic control  Interventions: . Comprehensive medication review performed, medication list updated in electronic medical record . Patient confirms continued medication adherence; patient up to date on fills for atorvastatin and losartan  . Confirms he has appointment w/ Dr. Gabriel Carina next week.  Patient  Self Care Activities:  . Patient will check blood glucose regularly, document, and provide at future appointments . Patient will take medications as prescribed . Patient will report any questions or concerns to provider   Initial goal documentation        Plan:  - Will outreach patient in the next 6-8 weeks for continued medication management support  [name], PharmD Clinical Pharmacist [place] [phone]

## 2019-02-10 DIAGNOSIS — Z9641 Presence of insulin pump (external) (internal): Secondary | ICD-10-CM | POA: Diagnosis not present

## 2019-02-10 DIAGNOSIS — E113293 Type 2 diabetes mellitus with mild nonproliferative diabetic retinopathy without macular edema, bilateral: Secondary | ICD-10-CM | POA: Diagnosis not present

## 2019-02-10 DIAGNOSIS — E1169 Type 2 diabetes mellitus with other specified complication: Secondary | ICD-10-CM | POA: Diagnosis not present

## 2019-02-10 DIAGNOSIS — Z794 Long term (current) use of insulin: Secondary | ICD-10-CM | POA: Diagnosis not present

## 2019-02-10 DIAGNOSIS — E669 Obesity, unspecified: Secondary | ICD-10-CM | POA: Diagnosis not present

## 2019-03-11 DIAGNOSIS — E119 Type 2 diabetes mellitus without complications: Secondary | ICD-10-CM | POA: Diagnosis not present

## 2019-03-19 ENCOUNTER — Telehealth: Payer: Self-pay

## 2019-03-25 ENCOUNTER — Ambulatory Visit: Payer: Medicare HMO

## 2019-03-27 ENCOUNTER — Other Ambulatory Visit: Payer: Self-pay

## 2019-03-28 ENCOUNTER — Ambulatory Visit (INDEPENDENT_AMBULATORY_CARE_PROVIDER_SITE_OTHER): Payer: Medicare HMO

## 2019-03-28 DIAGNOSIS — Z23 Encounter for immunization: Secondary | ICD-10-CM

## 2019-04-04 ENCOUNTER — Ambulatory Visit: Payer: Medicare HMO | Admitting: Pharmacist

## 2019-04-04 DIAGNOSIS — E1169 Type 2 diabetes mellitus with other specified complication: Secondary | ICD-10-CM

## 2019-04-04 DIAGNOSIS — E7800 Pure hypercholesterolemia, unspecified: Secondary | ICD-10-CM

## 2019-04-04 DIAGNOSIS — I10 Essential (primary) hypertension: Secondary | ICD-10-CM

## 2019-04-04 NOTE — Chronic Care Management (AMB) (Signed)
Chronic Care Management   Follow Up Note   04/04/2019 Name: Aaron [name]: [id number] DOB: 1950-09-17  Referred by: Aaron Maple, MD Reason for referral : Chronic Care Management (Medication Management)   Aaron [name] is a 68 y.o. year old male who is a primary care patient of [name], [name], MD. The CCM team was consulted for assistance with chronic disease management and care coordination needs.    Review of patient status, including review of consultants reports, relevant laboratory and other test results, and collaboration with appropriate care team members and the patient's provider was performed as part of comprehensive patient evaluation and provision of chronic care management services.    SDOH (Social Determinants of Health) screening performed today: Financial Strain . See Care Plan for related entries.   Outpatient Encounter Medications as of 04/04/2019  Medication Sig Note  . allopurinol (ZYLOPRIM) 300 MG tablet Take 1 tablet (300 mg total) by mouth daily.   Marland Kitchen amLODipine (NORVASC) 10 MG tablet Take 1 tablet (10 mg total) by mouth daily. 04/04/2019: QPM  . ASPIRIN 81 PO    . atorvastatin (LIPITOR) 20 MG tablet Take 1 tablet (20 mg total) by mouth daily.   . carvedilol (COREG) 25 MG tablet Take 1 tablet (25 mg total) by mouth 2 (two) times daily with a meal.   . hydrochlorothiazide (HYDRODIURIL) 25 MG tablet Take 1 tablet (25 mg total) by mouth daily. 04/04/2019: AM  . insulin lispro (HUMALOG) 100 UNIT/ML injection Inject into the skin.   Marland Kitchen losartan (COZAAR) 100 MG tablet Take 1 tablet (100 mg total) by mouth daily. 04/04/2019: QPM  . acetaminophen (TYLENOL) 500 MG tablet Take 1-2 tablets (500-1,000 mg total) by mouth every 6 (six) hours as needed for mild pain (pain score 1-3 or temp > 100.5). (Patient not taking: Reported on 04/04/2019)   . [DISCONTINUED] BD INSULIN SYRINGE ULTRAFINE 31G X 15/64" 1 ML MISC USE AS DIRECTED    No facility-administered  encounter medications on file as of 04/04/2019.      Goals Addressed            This Visit's Progress     Patient Stated   . COMPLETED: "I can't afford my insulin" (pt-stated)       Current Barriers:  . Financial Barriers - RESOLVED in regard to medications; patient approved for Novolog vials from [facility], and is back on Medtronic pump therapy. . However, endorses concerns with having to pay out of pocket for testing supplies, as [employer] does not cover the One Touch supplies that connect with his Medtronic pump o Outreached Medtronic rep; she noted that supplies should also be covered under Medicare Part B  Pharmacist Clinical Goal(s):  Marland Kitchen Over the next 90 days, patient will work with patient to address needs related to medication access   Interventions: . Contacted patient; explained that he should talk with Aaron [name] office about sending test strips to be filled under Part B  Patient Self Care Activities:  . Self administers medications as prescribed  Please see past updates related to this goal by clicking on the "Past Updates" button in the selected goal      . "I want to stay healthy" (pt-stated)       Current Barriers:  . Diabetes: controlled; most recent A1c 6.8% after restarting insulin pump therapy o Follows with Aaron [name] . Current antihyperglycemic regimen: Humalog via pump . Current BG readings:  o Notes that he is checking up to  five times daily. Reports that he and Aaron [name] team investigated coverage for Dexcom CGM system, however, the copay would be too expensive for him right now. He notes that his finances may allow for CGM starting in 2021 . Cardiovascular risk reduction: o Current hypertensive regimen: amlodipine 10 mg daily, carvedilol 25 mg BID, losartan 100 mg daily; not checking BP at home, but BP remains well controlled at multiple clinic visits  o Current hyperlipidemia regimen: atorvastatin 20 mg daily; LDL at goal <70 on last check per Care  Everywhere  Pharmacist Clinical Goal(s):  Marland Kitchen Over the next 90 days, patient with work with PharmD and primary care provider to address maintained glycemic control  Interventions: . Comprehensive medication review performed, medication list updated in electronic medical record . Congratulated patient on continued control w/ insulin pump.  . Discussed that [place] will require [id number] application to be submitted after 05/22/2018, so will be working on preparing applications in late January. Encouraged patient to ensure he filled his Humalog before the end of the year if able, to have maximal supply to carry over into 2021 . Reviewed goal BP and LDL given DM. Patient at goal. Reviewed refill histories; patient up to date on fills for statin and losartan . Discussed patient's plan for follow up with primary care provider. He plans to contact the office to schedule his yearly physical around Feb/March. Consider checking serum uric acid at that time to evaluate allopurinol therapy. . Discussed appropriate antihypertensive administration time. Patient will do the following:  o AM: carvedilol, HCTZ o PM: carvedilol, losartan, amlodipine  Patient Self Care Activities:  . Patient will check blood glucose regularly, document, and provide at future appointments . Patient will take medications as prescribed . Patient will report any questions or concerns to provider   Please see past updates related to this goal by clicking on the "Past Updates" button in the selected goal          Plan:  - Will collaborate w/ Aaron [name], CPhT as above for [id number] [employer] application - Will outreach patient in ~6-8 weeks for continued medication management support  Aaron [name], [place] [phone]

## 2019-04-04 NOTE — Patient Instructions (Signed)
Visit Information  Goals Addressed            This Visit's Progress     Patient Stated   . COMPLETED: "I can't afford my insulin" (pt-stated)       Current Barriers:  . Financial Barriers - RESOLVED in regard to medications; patient approved for Novolog vials from [facility], and is back on Medtronic pump therapy. . However, endorses concerns with having to pay out of pocket for testing supplies, as [employer] does not cover the One Touch supplies that connect with his Medtronic pump o Outreached Medtronic rep; she noted that supplies should also be covered under Medicare Part B  Pharmacist Clinical Goal(s):  Marland Kitchen Over the next 90 days, patient will work with patient to address needs related to medication access   Interventions: . Contacted patient; explained that he should talk with Dr. Joycie Peek office about sending test strips to be filled under Part B  Patient Self Care Activities:  . Self administers medications as prescribed  Please see past updates related to this goal by clicking on the "Past Updates" button in the selected goal      . "I want to stay healthy" (pt-stated)       Current Barriers:  . Diabetes: controlled; most recent A1c 6.8% after restarting insulin pump therapy o Follows with Dr. Gabriel Carina . Current antihyperglycemic regimen: Humalog via pump . Current BG readings:  o Notes that he is checking up to five times daily. Reports that he and Dr. Joycie Peek team investigated coverage for Dexcom CGM system, however, the copay would be too expensive for him right now. He notes that his finances may allow for CGM starting in 2021 . Cardiovascular risk reduction: o Current hypertensive regimen: amlodipine 10 mg daily, carvedilol 25 mg BID, losartan 100 mg daily; not checking BP at home, but BP remains well controlled at multiple clinic visits  o Current hyperlipidemia regimen: atorvastatin 20 mg daily; LDL at goal <70 on last check per Care Everywhere  Pharmacist Clinical  Goal(s):  Marland Kitchen Over the next 90 days, patient with work with PharmD and primary care provider to address maintained glycemic control  Interventions: . Comprehensive medication review performed, medication list updated in electronic medical record . Congratulated patient on continued control w/ insulin pump.  . Discussed that [place] will require [id number] application to be submitted after 05/22/2018, so will be working on preparing applications in late January. Encouraged patient to ensure he filled his Humalog before the end of the year if able, to have maximal supply to carry over into 2021 . Reviewed goal BP and LDL given DM. Patient at goal. Reviewed refill histories; patient up to date on fills for statin and losartan . Discussed patient's plan for follow up with primary care provider. He plans to contact the office to schedule his yearly physical around Feb/March. Consider checking serum uric acid at that time to evaluate allopurinol therapy. . Discussed appropriate antihypertensive administration time. Patient will do the following:  o AM: carvedilol, HCTZ o PM: carvedilol, losartan, amlodipine  Patient Self Care Activities:  . Patient will check blood glucose regularly, document, and provide at future appointments . Patient will take medications as prescribed . Patient will report any questions or concerns to provider   Please see past updates related to this goal by clicking on the "Past Updates" button in the selected goal         The patient verbalized understanding of instructions provided today and declined a print  copy of patient instruction materials.   Plan:  - Will collaborate w/ Susy Frizzle, CPhT as above for [id number] [employer] C[employer] application - Will outreach patient in ~6-8 weeks for continued medication management support  [name], PharmD Clinical Pharmacist [place] [phone]

## 2019-05-07 DIAGNOSIS — G4733 Obstructive sleep apnea (adult) (pediatric): Secondary | ICD-10-CM | POA: Diagnosis not present

## 2019-05-13 DIAGNOSIS — E113293 Type 2 diabetes mellitus with mild nonproliferative diabetic retinopathy without macular edema, bilateral: Secondary | ICD-10-CM | POA: Diagnosis not present

## 2019-05-13 DIAGNOSIS — Z794 Long term (current) use of insulin: Secondary | ICD-10-CM | POA: Diagnosis not present

## 2019-05-20 DIAGNOSIS — Z794 Long term (current) use of insulin: Secondary | ICD-10-CM | POA: Diagnosis not present

## 2019-05-20 DIAGNOSIS — Z9641 Presence of insulin pump (external) (internal): Secondary | ICD-10-CM | POA: Diagnosis not present

## 2019-05-20 DIAGNOSIS — E1169 Type 2 diabetes mellitus with other specified complication: Secondary | ICD-10-CM | POA: Diagnosis not present

## 2019-05-20 DIAGNOSIS — E669 Obesity, unspecified: Secondary | ICD-10-CM | POA: Diagnosis not present

## 2019-05-20 DIAGNOSIS — E113293 Type 2 diabetes mellitus with mild nonproliferative diabetic retinopathy without macular edema, bilateral: Secondary | ICD-10-CM | POA: Diagnosis not present

## 2019-05-28 ENCOUNTER — Ambulatory Visit (INDEPENDENT_AMBULATORY_CARE_PROVIDER_SITE_OTHER): Payer: Medicare HMO | Admitting: Pharmacist

## 2019-05-28 DIAGNOSIS — E785 Hyperlipidemia, unspecified: Secondary | ICD-10-CM | POA: Diagnosis not present

## 2019-05-28 DIAGNOSIS — E1169 Type 2 diabetes mellitus with other specified complication: Secondary | ICD-10-CM | POA: Diagnosis not present

## 2019-05-28 DIAGNOSIS — I10 Essential (primary) hypertension: Secondary | ICD-10-CM | POA: Diagnosis not present

## 2019-05-28 NOTE — Patient Instructions (Signed)
Visit Information  Goals Addressed            This Visit's Progress     Patient Stated   . "I want to stay healthy" (pt-stated)       Current Barriers:  . Diabetes: controlled; most recent A1c 7% after restarting insulin pump therapy o Follows with Dr. Gabriel Carina; last visit 05/19/19. No changes made at that appointment, as patient's sugars remain well controlled . Current antihyperglycemic regimen: Humalog via pump . Current BG readings: checking up to five times daily; average BG ~140 per last visit w/ Dr. Gabriel Carina . Cardiovascular risk reduction: o Current hypertensive regimen: amlodipine 10 mg daily, carvedilol 25 mg BID, losartan 100 mg daily, HCTZ 25 mg daily; not checking BP at home, but BP remains well controlled at recent clinic visits  o Current hyperlipidemia regimen: atorvastatin 20 mg daily; LDL at goal <70 on last check per Care Everywhere o Current antiplatelet regimen: ASA 81 mg daily   Pharmacist Clinical Goal(s):  Marland Kitchen Over the next 90 days, patient with work with PharmD and primary care provider to address maintained glycemic control  Interventions: . Comprehensive medication review performed, medication list updated in electronic medical record . Congratulated patient on continued glucose control. . Due to reapply for [name] assistance for 2021. Will collaborate w/ Susy Frizzle, CPhT to mail patient his portion of application. He will mail back signed application and required materials. CPhT will fax provider portion to [facility]ina at [facility]. Once all parts are received, will collaborate w/ CPhT for submission and follow up.  . Patient due for PCP f/u in March. He is aware of this, and notes he will call sometime next month to schedule an appointment.   Patient Self Care Activities:  . Patient will check blood glucose regularly, document, and provide at future appointments . Patient will take medications as prescribed . Patient will report any questions or  concerns to provider   Please see past updates related to this goal by clicking on the "Past Updates" button in the selected goal         The patient verbalized understanding of instructions provided today and declined a print copy of patient instruction materials.   Plan: - Will collaborate w/ patient, endocrinology, and CPhT as above - Scheduled outreach call 08/12/19 @ 1 pm, s/p PCP f/u  [name], PharmD, [place] [phone]

## 2019-05-28 NOTE — Chronic Care Management (AMB) (Signed)
Chronic Care Management   Follow Up Note   05/28/2019 Name: Aaron [name] MRN: [id number] DOB: [birth year]  Referred by: [name], MD Reason for referral : Chronic Care Management (Medication Management)   Aaron [name] is a 69 y.o. year old male who is a primary care patient of [name], [name], MD. The CCM team was consulted for assistance with chronic disease management and care coordination needs.    Contacted patient for medication management review today.   Review of patient status, including review of consultants reports, relevant laboratory and other test results, and collaboration with appropriate care team members and the patient's provider was performed as part of comprehensive patient evaluation and provision of chronic care management services.    SDOH (Social Determinants of Health) screening performed today: Financial Strain . See Care Plan for related entries.   Outpatient Encounter Medications as of 05/28/2019  Medication Sig Note  . allopurinol (ZYLOPRIM) 300 MG tablet Take 1 tablet (300 mg total) by mouth daily.   Marland Kitchen amLODipine (NORVASC) 10 MG tablet Take 1 tablet (10 mg total) by mouth daily. 04/04/2019: QPM  . ASPIRIN 81 PO    . atorvastatin (LIPITOR) 20 MG tablet Take 1 tablet (20 mg total) by mouth daily.   . carvedilol (COREG) 25 MG tablet Take 1 tablet (25 mg total) by mouth 2 (two) times daily with a meal.   . hydrochlorothiazide (HYDRODIURIL) 25 MG tablet Take 1 tablet (25 mg total) by mouth daily. 04/04/2019: AM  . insulin aspart (NOVOLOG) 100 UNIT/ML injection Inject into the skin. Using via insulin pump   . losartan (COZAAR) 100 MG tablet Take 1 tablet (100 mg total) by mouth daily. 04/04/2019: QPM  . acetaminophen (TYLENOL) 500 MG tablet Take 1-2 tablets (500-1,000 mg total) by mouth every 6 (six) hours as needed for mild pain (pain score 1-3 or temp > 100.5). (Patient not taking: Reported on 04/04/2019)   . [DISCONTINUED] insulin lispro (HUMALOG)  100 UNIT/ML injection Inject into the skin.    No facility-administered encounter medications on file as of 05/28/2019.     Goals Addressed            This Visit's Progress     Patient Stated   . "I want to stay healthy" (pt-stated)       Current Barriers:  . Diabetes: controlled; most recent A1c 7% after restarting insulin pump therapy o Follows with Dr. Gabriel Carina; last visit 05/19/19. No changes made at that appointment, as patient's sugars remain well controlled . Current antihyperglycemic regimen: Humalog via pump . Current BG readings: checking up to five times daily; average BG ~140 per last visit w/ Dr. Gabriel Carina . Cardiovascular risk reduction: o Current hypertensive regimen: amlodipine 10 mg daily, carvedilol 25 mg BID, losartan 100 mg daily, HCTZ 25 mg daily; not checking BP at home, but BP remains well controlled at recent clinic visits  o Current hyperlipidemia regimen: atorvastatin 20 mg daily; LDL at goal <70 on last check per Care Everywhere o Current antiplatelet regimen: ASA 81 mg daily   Pharmacist Clinical Goal(s):  Marland Kitchen Over the next 90 days, patient with work with PharmD and primary care provider to address maintained glycemic control  Interventions: . Comprehensive medication review performed, medication list updated in electronic medical record . Congratulated patient on continued glucose control. . Due to reapply for [name] assistance for 2021. Will collaborate w/ Susy Frizzle, CPhT to mail patient his portion of application. He will mail back signed application  and required materials. CPhT will fax provider portion to Dr. Gabriel Carina at [facility]. Once all parts are received, will collaborate w/ CPhT for submission and follow up.  . Patient due for PCP f/u in March. He is aware of this, and notes he will call sometime next month to schedule an appointment.   Patient Self Care Activities:  . Patient will check blood glucose regularly, document, and provide at future  appointments . Patient will take medications as prescribed . Patient will report any questions or concerns to provider   Please see past updates related to this goal by clicking on the "Past Updates" button in the selected goal          Plan: - Will collaborate w/ patient, endocrinology, and CPhT as above - Scheduled outreach call 08/12/19 @ 1 pm, s/p PCP f/u  [name], PharmD, [place] [phone]

## 2019-06-03 ENCOUNTER — Other Ambulatory Visit: Payer: Self-pay | Admitting: Pharmacy Technician

## 2019-06-03 DIAGNOSIS — E119 Type 2 diabetes mellitus without complications: Secondary | ICD-10-CM | POA: Diagnosis not present

## 2019-06-03 NOTE — Patient Outreach (Signed)
[facility]) Care Management  06/03/2019  Aaron [name] March 29, 1951 [id number]                                        Medication Assistance Referral  Referral From: [facility] Embedded RPh Catie T.   Medication/Company: [employer] / Novo Nordisk Patient application portion:  Web designer portion: Faxed  to Dr. Gabriel Carina Provider address/fax verified via: Office website   Follow up:  Will follow up with patient in 5-10 business days to confirm application(s) have been received.  Kimberly Nieland P. Carlin Attridge, [place] Management [phone]

## 2019-06-11 ENCOUNTER — Other Ambulatory Visit: Payer: Self-pay | Admitting: Pharmacy Technician

## 2019-06-11 NOTE — Patient Outreach (Signed)
[facility]) Care Management  06/11/2019  Aaron [name] 1950/06/05 [id number]    Successful call placed to patient regarding patient assistance application(s) for Novolog with [name] , HIPAA identifiers verified.   Patient informed he received the application and place it in the mail back to me on 06/06/2018. Patient informed he has approximately 7-10 days worth of medication left. Informed patient I would submit the application as soon as I receive it back.  Follow up:  Will route note to embedded [facility] RPh [name] for case closure if document(s) have not been received in the next 15 business days.  Aaron [name], [place] Management [phone]

## 2019-06-12 ENCOUNTER — Telehealth: Payer: Self-pay

## 2019-06-12 ENCOUNTER — Other Ambulatory Visit: Payer: Self-pay | Admitting: Nurse Practitioner

## 2019-06-12 ENCOUNTER — Other Ambulatory Visit: Payer: Self-pay

## 2019-06-12 DIAGNOSIS — I10 Essential (primary) hypertension: Secondary | ICD-10-CM

## 2019-06-12 MED ORDER — HYDROCHLOROTHIAZIDE 25 MG PO TABS: 25.0000 mg | ORAL_TABLET | Freq: Every day | ORAL | 0 refills | Status: DC

## 2019-06-12 MED ORDER — ALLOPURINOL 300 MG PO TABS: 300.0000 mg | ORAL_TABLET | Freq: Every day | ORAL | 0 refills | Status: DC

## 2019-06-12 NOTE — Telephone Encounter (Signed)
Appointment scheduled for February 10th at 3:30 with Jolene.

## 2019-06-12 NOTE — Telephone Encounter (Signed)
Patient would like a 90 day supply of Hydrochlorothiazide 25mg 

## 2019-06-12 NOTE — Telephone Encounter (Signed)
Patient would like a 90 day supply of Allopurinol 300 mg

## 2019-06-12 NOTE — Telephone Encounter (Signed)
Refill request for Amlodipine and Hydrochlorothiazide.  LOV 02/05/2019

## 2019-06-12 NOTE — Telephone Encounter (Signed)
Will provide 90 day supply of medications after his upcoming appointment.

## 2019-06-12 NOTE — Telephone Encounter (Signed)
Will send sixty day supply via chart, but he needs a follow-up scheduled please.

## 2019-06-13 ENCOUNTER — Ambulatory Visit: Payer: Self-pay | Admitting: Pharmacist

## 2019-06-13 DIAGNOSIS — E1169 Type 2 diabetes mellitus with other specified complication: Secondary | ICD-10-CM

## 2019-06-13 NOTE — Chronic Care Management (AMB) (Signed)
Chronic Care Management   Follow Up Note   06/13/2019 Name: Aaron [name] MRN: [id number] DOB: 11/05/1950  Referred by: [name], MD Reason for referral : Chronic Care Management (Medication Management)   Aaron [name] is a 69 y.o. year old male who is a primary care patient of [name], [name], MD. The CCM team was consulted for assistance with chronic disease management and care coordination needs.    Care coordination completed today.   Review of patient status, including review of consultants reports, relevant laboratory and other test results, and collaboration with appropriate care team members and the patient's provider was performed as part of comprehensive patient evaluation and provision of chronic care management services.    SDOH (Social Determinants of Health) screening performed today: Financial Strain . See Care Plan for related entries.   Outpatient Encounter Medications as of 06/13/2019  Medication Sig Note  . acetaminophen (TYLENOL) 500 MG tablet Take 1-2 tablets (500-1,000 mg total) by mouth every 6 (six) hours as needed for mild pain (pain score 1-3 or temp > 100.5). (Patient not taking: Reported on 04/04/2019)   . allopurinol (ZYLOPRIM) 300 MG tablet Take 1 tablet (300 mg total) by mouth daily.   Marland Kitchen amLODipine (NORVASC) 10 MG tablet Take 1 tablet (10 mg total) by mouth daily. 04/04/2019: QPM  . ASPIRIN 81 PO    . atorvastatin (LIPITOR) 20 MG tablet Take 1 tablet (20 mg total) by mouth daily.   . carvedilol (COREG) 25 MG tablet Take 1 tablet (25 mg total) by mouth 2 (two) times daily with a meal.   . hydrochlorothiazide (HYDRODIURIL) 25 MG tablet Take 1 tablet (25 mg total) by mouth daily.   . insulin aspart (NOVOLOG) 100 UNIT/ML injection Inject into the skin. Using via insulin pump   . losartan (COZAAR) 100 MG tablet Take 1 tablet (100 mg total) by mouth daily. 04/04/2019: QPM   No facility-administered encounter medications on file as of 06/13/2019.     Objective:   Goals Addressed            This Visit's Progress     Patient Stated   . "I want to stay healthy" (pt-stated)       Current Barriers:  . Diabetes: controlled; most recent A1c 7% after restarting insulin pump therapy o Working on reapplication for assistance for Humalog. Patient noted to [facility] that he only had ~7-10 days of insulin left.  . Current antihyperglycemic regimen: Humalog via pump . Current BG readings: checking up to five times daily; average BG ~140 per last visit w/ Dr. Gabriel Carina . Cardiovascular risk reduction: o Current hypertensive regimen: amlodipine 10 mg daily, carvedilol 25 mg BID, losartan 100 mg daily, HCTZ 25 mg daily; not checking BP at home, but BP remains well controlled at recent clinic visits  o Current hyperlipidemia regimen: atorvastatin 20 mg daily; LDL at goal <70 on last check per Care Everywhere o Current antiplatelet regimen: ASA 81 mg daily   Pharmacist Clinical Goal(s):  Marland Kitchen Over the next 90 days, patient with work with PharmD and primary care provider to address maintained glycemic control  Interventions: . Contacted [facility] endocrinology. Left message asking if they have Novolog vial samples. If not, will call patient next week to discuss picking up a 1 month supply vs Novo Nordisk immediate use coupon   Patient Self Care Activities:  . Patient will check blood glucose regularly, document, and provide at future appointments . Patient will take medications as prescribed .  Patient will report any questions or concerns to provider   Please see past updates related to this goal by clicking on the "Past Updates" button in the selected goal          Plan:  - Will follow up as above next week  [name], PharmD, [place] [phone]

## 2019-06-13 NOTE — Patient Instructions (Signed)
Visit Information  Goals Addressed            This Visit's Progress     Patient Stated   . "I want to stay healthy" (pt-stated)       Current Barriers:  . Diabetes: controlled; most recent A1c 7% after restarting insulin pump therapy o Working on reapplication for assistance for Humalog. Patient noted to [facility] that he only had ~7-10 days of insulin left.  . Current antihyperglycemic regimen: Humalog via pump . Current BG readings: checking up to five times daily; average BG ~140 per last visit w/ Dr. Gabriel Carina . Cardiovascular risk reduction: o Current hypertensive regimen: amlodipine 10 mg daily, carvedilol 25 mg BID, losartan 100 mg daily, HCTZ 25 mg daily; not checking BP at home, but BP remains well controlled at recent clinic visits  o Current hyperlipidemia regimen: atorvastatin 20 mg daily; LDL at goal <70 on last check per Care Everywhere o Current antiplatelet regimen: ASA 81 mg daily   Pharmacist Clinical Goal(s):  Marland Kitchen Over the next 90 days, patient with work with PharmD and primary care provider to address maintained glycemic control  Interventions: . Contacted [facility] endocrinology. Left message asking if they have Novolog vial samples. If not, will call patient next week to discuss picking up a 1 month supply vs Novo Nordisk immediate use coupon   Patient Self Care Activities:  . Patient will check blood glucose regularly, document, and provide at future appointments . Patient will take medications as prescribed . Patient will report any questions or concerns to provider   Please see past updates related to this goal by clicking on the "Past Updates" button in the selected goal         The patient verbalized understanding of instructions provided today and declined a print copy of patient instruction materials.  Plan:  - Will follow up as above next week  [name], PharmD, [place] [phone]

## 2019-06-15 IMAGING — XA DG HIP (WITH PELVIS) OPERATIVE*R*
1 series · 4 of 4 positions shown · non-contrast
Comparison: 0 minutes 12 seconds

Dose: 7.2 mGy

CLINICAL DATA: RIGHT hip surgery

EXAM:
OPERATIVE RIGHT HIP (WITH PELVIS IF PERFORMED) 2 VIEWS
TECHNIQUE: Fluoroscopic spot image(s) were submitted for interpretation
post-operatively.

[Series 11: ortho standard · 2 acquisitions, 4 frames shown]
[im 1/2]
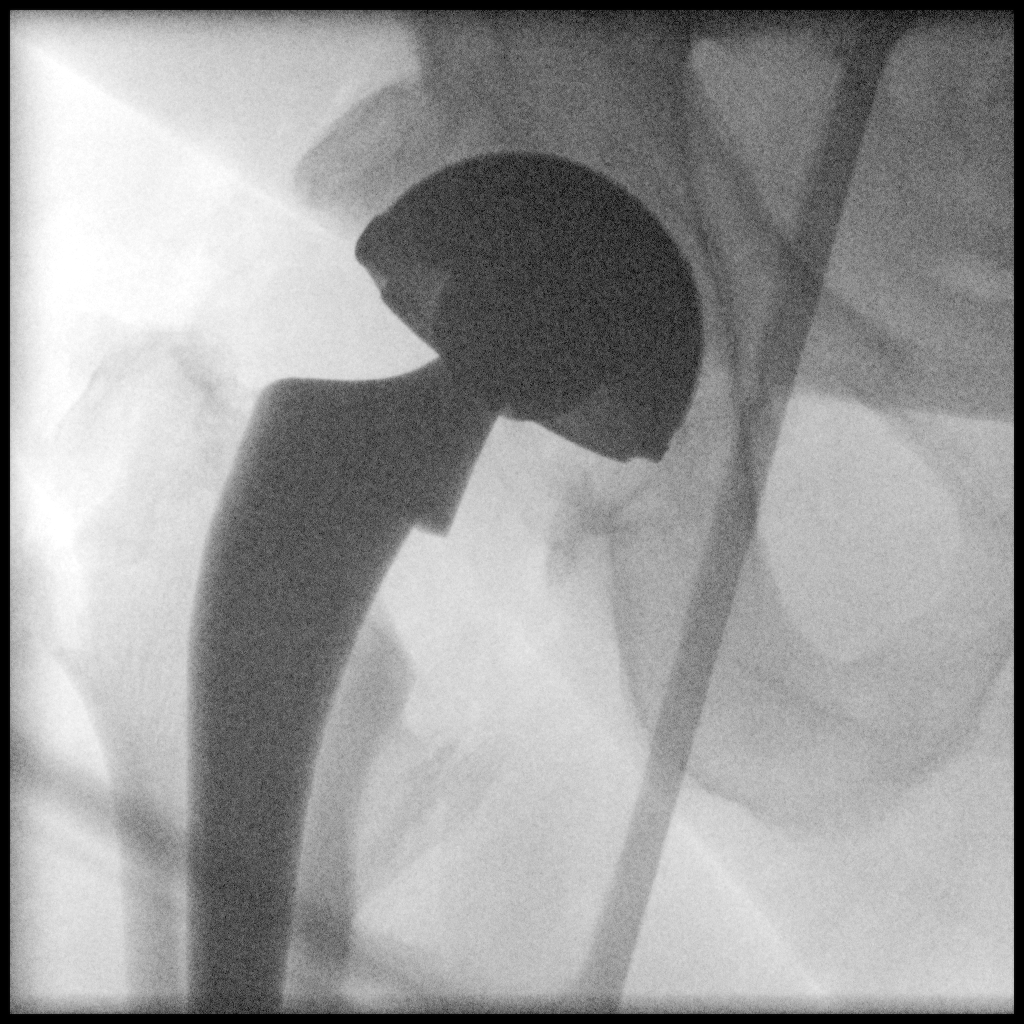
[im 1/2]
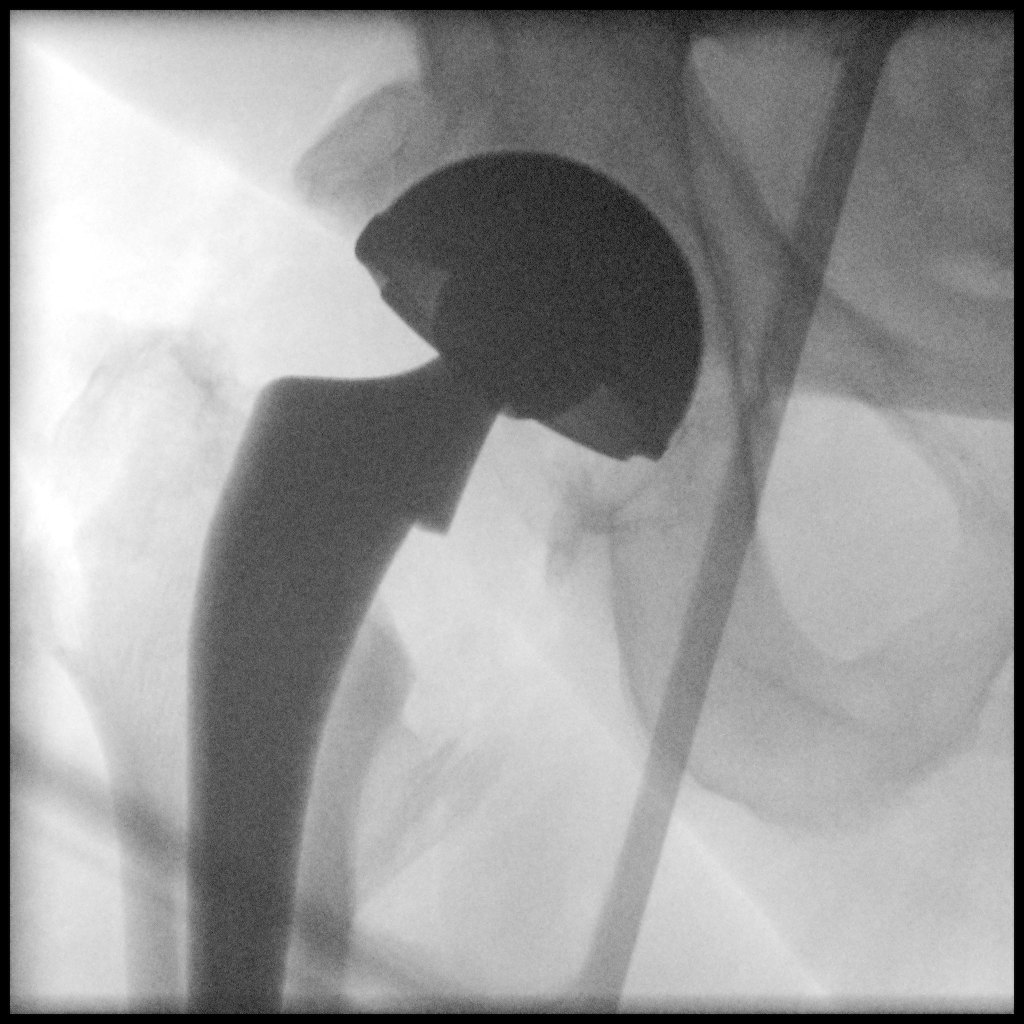
[im 1/2]
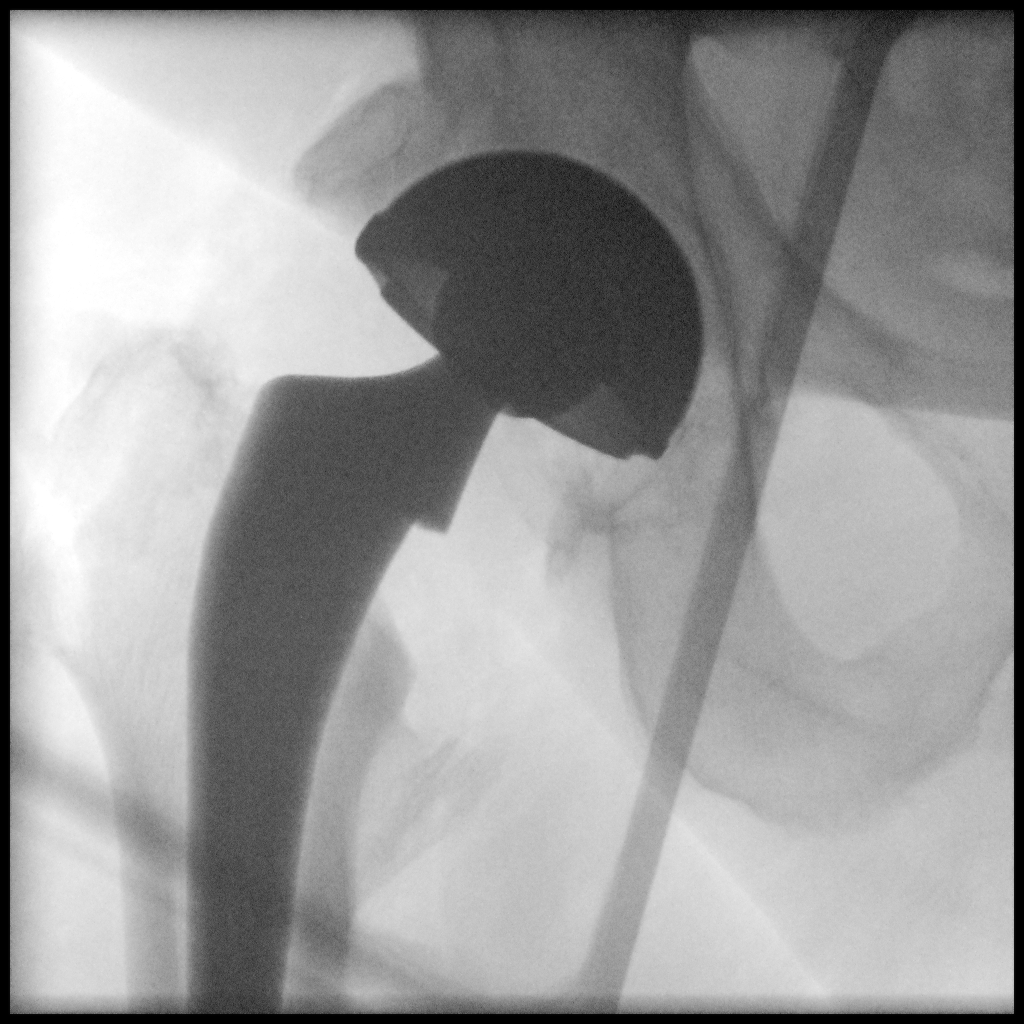
[im 2/2]
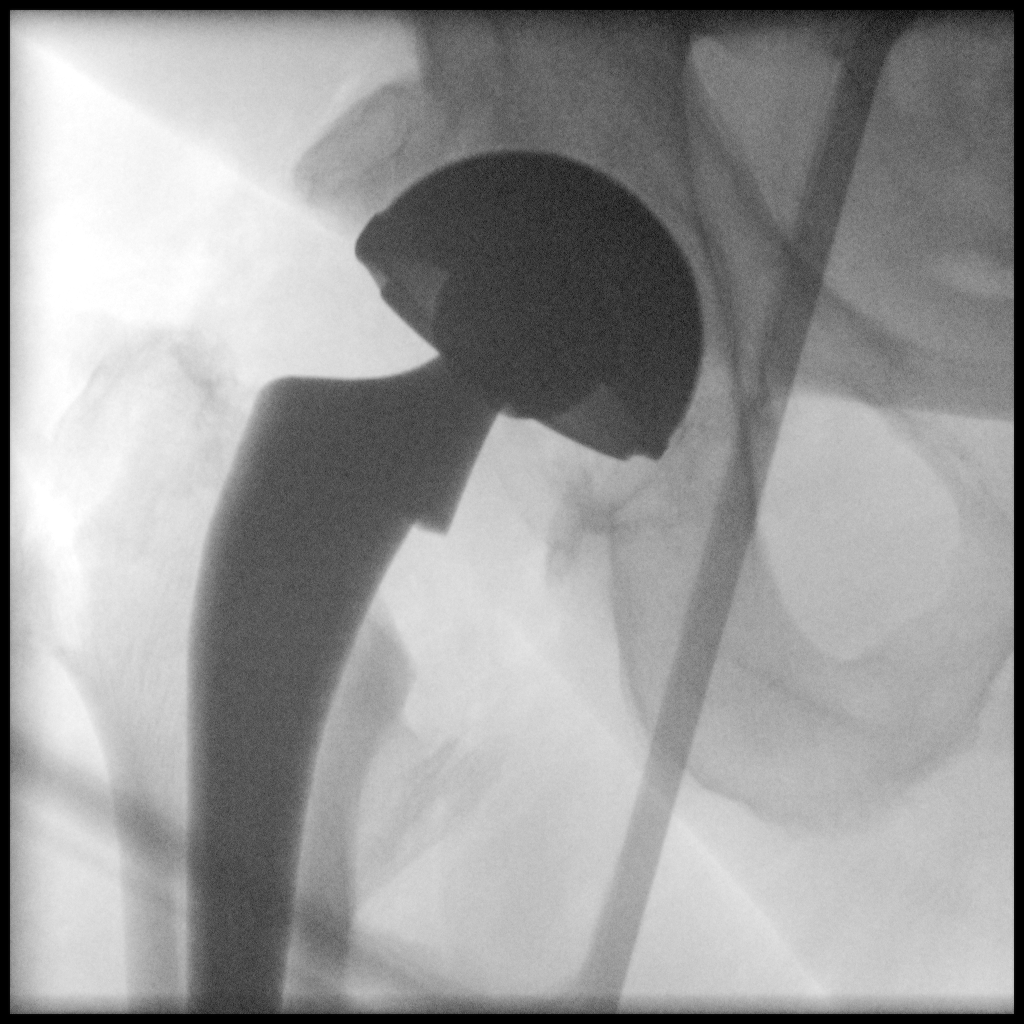

[4 of 4 positions shown; findings below may reference images not displayed]

FINDINGS: RIGHT hip prosthesis identified.

Tip of femoral component not imaged.

No fracture or dislocation identified within visualized structures.
IMPRESSION: RIGHT hip prosthesis without acute complication.

## 2019-06-16 ENCOUNTER — Other Ambulatory Visit: Payer: Self-pay | Admitting: Pharmacy Technician

## 2019-06-16 NOTE — Patient Outreach (Signed)
[place] [facility]) Care Management  06/16/2019  Aaron [name] 12-22-1950 [id number]   Incoming call received from patient in regards to [name] application for [employer].  Spoke to patient, HIPAA identifiers verified.  Patient was calling to inquire if I have received his applicaiton in the mail. Unfortunately I have not. Informed patient that I sent an in basket message to embedded [place] about the mail delay and she responded informing she would reach out to him today but more than likely tomorrow to help him obtain a coupon to hopefully receive a supply until we can get his patient assistance submitted. Patient verbalized understanding.  Townes Fuhs P. Clara Smolen, [place] Management [phone]

## 2019-06-17 ENCOUNTER — Ambulatory Visit: Payer: Self-pay | Admitting: Pharmacist

## 2019-06-17 DIAGNOSIS — E1169 Type 2 diabetes mellitus with other specified complication: Secondary | ICD-10-CM

## 2019-06-17 NOTE — Patient Instructions (Signed)
Visit Information  Goals Addressed            This Visit's Progress     Patient Stated   . "I want to stay healthy" (pt-stated)       Current Barriers:  . Diabetes: controlled; most recent A1c 7% after restarting insulin pump therapy o Working on reapplication for assistance for [employer]. Patient is going to run out of insulin tomorrow.  . Current antihyperglycemic regimen: Novolog via pump . Current BG readings: checking up to five times daily; average BG ~140 per last visit w/ Dr. Gabriel Carina . Cardiovascular risk reduction: o Current hypertensive regimen: amlodipine 10 mg daily, carvedilol 25 mg BID, losartan 100 mg daily, HCTZ 25 mg daily; not checking BP at home, but BP remains well controlled at recent clinic visits  o Current hyperlipidemia regimen: atorvastatin 20 mg daily; LDL at goal <70 on last check per Care Everywhere o Current antiplatelet regimen: ASA 81 mg daily   Pharmacist Clinical Goal(s):  Marland Kitchen Over the next 90 days, patient with work with PharmD and primary care provider to address maintained glycemic control  Interventions: . Contacted patient. Provided instructions to [facility] Immediate Supply program at [phone]. Patient will call today. Discussed that they will give coupon card information to provide to the pharmacy . Contacted [facility], Dr. Joycie Peek office. Asked that they send an updated prescription for Novolog vials to the pharmacy to use with the immediate supply coupon.   Patient Self Care Activities:  . Patient will check blood glucose regularly, document, and provide at future appointments . Patient will take medications as prescribed . Patient will report any questions or concerns to provider   Please see past updates related to this goal by clicking on the "Past Updates" button in the selected goal         The patient verbalized understanding of instructions provided today and declined a print copy of patient instruction materials.    Plan:  - Patient will let me know when he has secured Novolog supply  [name], PharmD, [place] [phone]

## 2019-06-17 NOTE — Chronic Care Management (AMB) (Signed)
Chronic Care Management   Follow Up Note   06/17/2019 Name: Aaron [name] MRN: [id number] DOB: 09-08-1950  Referred by: [name], MD Reason for referral : Chronic Care Management (Medication Management)   Aaron [name] is a 70 y.o. year old male who is a primary care patient of [name], [name], MD. The CCM team was consulted for assistance with chronic disease management and care coordination needs.    Contacted patient for medication access needs.   Review of patient status, including review of consultants reports, relevant laboratory and other test results, and collaboration with appropriate care team members and the patient's provider was performed as part of comprehensive patient evaluation and provision of chronic care management services.    SDOH (Social Determinants of Health) screening performed today: Financial Strain . See Care Plan for related entries.   Outpatient Encounter Medications as of 06/17/2019  Medication Sig Note  . acetaminophen (TYLENOL) 500 MG tablet Take 1-2 tablets (500-1,000 mg total) by mouth every 6 (six) hours as needed for mild pain (pain score 1-3 or temp > 100.5). (Patient not taking: Reported on 04/04/2019)   . allopurinol (ZYLOPRIM) 300 MG tablet Take 1 tablet (300 mg total) by mouth daily.   Marland Kitchen amLODipine (NORVASC) 10 MG tablet Take 1 tablet (10 mg total) by mouth daily. 04/04/2019: QPM  . ASPIRIN 81 PO    . atorvastatin (LIPITOR) 20 MG tablet Take 1 tablet (20 mg total) by mouth daily.   . carvedilol (COREG) 25 MG tablet Take 1 tablet (25 mg total) by mouth 2 (two) times daily with a meal.   . hydrochlorothiazide (HYDRODIURIL) 25 MG tablet Take 1 tablet (25 mg total) by mouth daily.   . insulin aspart (NOVOLOG) 100 UNIT/ML injection Inject into the skin. Using via insulin pump   . losartan (COZAAR) 100 MG tablet Take 1 tablet (100 mg total) by mouth daily. 04/04/2019: QPM   No facility-administered encounter medications on file as of  06/17/2019.     Objective:   Goals Addressed            This Visit's Progress     Patient Stated   . "I want to stay healthy" (pt-stated)       Current Barriers:  . Diabetes: controlled; most recent A1c 7% after restarting insulin pump therapy o Working on reapplication for assistance for [employer]. Patient is going to run out of insulin tomorrow.  . Current antihyperglycemic regimen: Novolog via pump . Current BG readings: checking up to five times daily; average BG ~140 per last visit w/ Dr. Gabriel Carina . Cardiovascular risk reduction: o Current hypertensive regimen: amlodipine 10 mg daily, carvedilol 25 mg BID, losartan 100 mg daily, HCTZ 25 mg daily; not checking BP at home, but BP remains well controlled at recent clinic visits  o Current hyperlipidemia regimen: atorvastatin 20 mg daily; LDL at goal <70 on last check per Care Everywhere o Current antiplatelet regimen: ASA 81 mg daily   Pharmacist Clinical Goal(s):  Marland Kitchen Over the next 90 days, patient with work with PharmD and primary care provider to address maintained glycemic control  Interventions: . Contacted patient. Provided instructions to [facility] Immediate Supply program at [phone]. Patient will call today. Discussed that they will give coupon card information to provide to the pharmacy . Contacted [facility], Dr. Joycie Peek office. Asked that they send an updated prescription for Novolog vials to the pharmacy to use with the immediate supply coupon.   Patient Self Care  Activities:  . Patient will check blood glucose regularly, document, and provide at future appointments . Patient will take medications as prescribed . Patient will report any questions or concerns to provider   Please see past updates related to this goal by clicking on the "Past Updates" button in the selected goal          Plan:  - Patient will let me know when he has secured Novolog supply  [name], PharmD, [place] [phone]

## 2019-06-19 ENCOUNTER — Other Ambulatory Visit: Payer: Self-pay | Admitting: Pharmacy Technician

## 2019-06-19 NOTE — Patient Outreach (Signed)
[place] [facility]) Care Management  06/19/2019  BRITAN EIGNER Nov 30, 1950 [id number]    Received both patient and provider portion(s) of patient assistance application(s) for Novolog. Faxed completed application and required documents into Novolog.  Will follow up with company(ies) in 3-7 business days to check status of application(s).  Ether Goebel P. Dracen Reigle, [place] Management [phone]

## 2019-06-24 ENCOUNTER — Other Ambulatory Visit: Payer: Self-pay | Admitting: Pharmacy Technician

## 2019-06-24 NOTE — Patient Outreach (Signed)
[place] [facility]) Care Management  06/24/2019  Aaron [name] 30-May-1950 [id number]    Care coordination call placed to [place] in regards to patient's application for Novolog.  Spoke to [place] who said there was missing information on the prescribe portion of the application. She informed the prescription would need to be re faxed it with the correct information.  Re faxed application with corrected information.  Will follow up with [name] in 3-5 business days.  [name] P. [name], [place] Management [phone]

## 2019-06-27 ENCOUNTER — Other Ambulatory Visit: Payer: Self-pay | Admitting: Pharmacy Technician

## 2019-06-27 NOTE — Patient Outreach (Signed)
[place] [facility]) Care Management  06/27/2019  SKYLUR BEDNER 01-29-1951 [id number]  Care coordination call placed to [place] in regards to Novolog application for Novolog.  Spoke to [place] who informed patient was APPROVED 06/25/2019-04/20/2020. He informed patient would received 120 days supply delivered to the office in the next 10-14 business days. He informed patient's next refill would be around 09/12/2019.  Will follow up with patient in 10-14 business days to confirm receipt of medication.  Amyla Heffner P. Quang Thorpe, [place] Management [phone]

## 2019-07-02 ENCOUNTER — Ambulatory Visit (INDEPENDENT_AMBULATORY_CARE_PROVIDER_SITE_OTHER): Payer: Medicare HMO | Admitting: Nurse Practitioner

## 2019-07-02 ENCOUNTER — Other Ambulatory Visit: Payer: Self-pay

## 2019-07-02 ENCOUNTER — Encounter: Payer: Self-pay | Admitting: Nurse Practitioner

## 2019-07-02 VITALS — BP 134/68 | HR 73 | Temp 97.8°F

## 2019-07-02 DIAGNOSIS — Z6841 Body Mass Index (BMI) 40.0 and over, adult: Secondary | ICD-10-CM | POA: Diagnosis not present

## 2019-07-02 DIAGNOSIS — E1159 Type 2 diabetes mellitus with other circulatory complications: Secondary | ICD-10-CM | POA: Diagnosis not present

## 2019-07-02 DIAGNOSIS — E785 Hyperlipidemia, unspecified: Secondary | ICD-10-CM | POA: Diagnosis not present

## 2019-07-02 DIAGNOSIS — I10 Essential (primary) hypertension: Secondary | ICD-10-CM | POA: Diagnosis not present

## 2019-07-02 DIAGNOSIS — Z23 Encounter for immunization: Secondary | ICD-10-CM | POA: Diagnosis not present

## 2019-07-02 DIAGNOSIS — M1A0720 Idiopathic chronic gout, left ankle and foot, without tophus (tophi): Secondary | ICD-10-CM | POA: Diagnosis not present

## 2019-07-02 DIAGNOSIS — E1169 Type 2 diabetes mellitus with other specified complication: Secondary | ICD-10-CM | POA: Diagnosis not present

## 2019-07-02 DIAGNOSIS — L989 Disorder of the skin and subcutaneous tissue, unspecified: Secondary | ICD-10-CM | POA: Diagnosis not present

## 2019-07-02 MED ORDER — AMLODIPINE BESYLATE 10 MG PO TABS: 10.0000 mg | ORAL_TABLET | Freq: Every day | ORAL | 4 refills | Status: DC

## 2019-07-02 MED ORDER — CARVEDILOL 25 MG PO TABS: 25.0000 mg | ORAL_TABLET | Freq: Two times a day (BID) | ORAL | 4 refills | Status: DC

## 2019-07-02 MED ORDER — ATORVASTATIN CALCIUM 20 MG PO TABS: 20.0000 mg | ORAL_TABLET | Freq: Every day | ORAL | 4 refills | Status: DC

## 2019-07-02 MED ORDER — HYDROCHLOROTHIAZIDE 25 MG PO TABS: 25.0000 mg | ORAL_TABLET | Freq: Every day | ORAL | 4 refills | Status: DC

## 2019-07-02 MED ORDER — ALLOPURINOL 300 MG PO TABS: 300.0000 mg | ORAL_TABLET | Freq: Every day | ORAL | 4 refills | Status: DC

## 2019-07-02 MED ORDER — LOSARTAN POTASSIUM 100 MG PO TABS: 100.0000 mg | ORAL_TABLET | Freq: Every day | ORAL | 4 refills | Status: DC

## 2019-07-02 NOTE — Progress Notes (Signed)
BP 134/68 (BP Location: Left Arm, Patient Position: Sitting)   Pulse 73   Temp 97.8 F (36.6 C) (Oral)   SpO2 98%    Subjective:    Patient ID: Aaron [name], male    DOB: [birth year], 69 y.o.   MRN: [id number]  HPI: Aaron [name] is a 69 y.o. male  Chief Complaint  Patient presents with  . Diabetes  . Hypertension  . 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    DIABETES Is followed by Dr. Gabriel Carina and last saw in December.  A1C at that time was 7.0%.  Uses insulin pump, per Dr. Gabriel Carina last note: "Medtronic MiniMed 630G insulin pump. His insulin pump was downloaded and reviewed. Basal rates 12 am 4.0 units/hr 8:30 am 2 units/hr 11 am 3 units.hr 2 pm 4 units/hr 24-hr basal = 88 units Humalog  Bolus settings I:C ratios 12 am 1:4, 1 pm 1:3 Sensitivity 10 Target 100-110 Actuve insulin time 4 hrs, max bolus 25 units"  Hypoglycemic episodes:no Polydipsia/polyuria: no Visual disturbance: no Chest pain: no Paresthesias: no Glucose Monitoring: yes  Accucheck frequency: QID  Fasting glucose: <130 on average --- this morning 107  Post prandial: 140 - 150 on average, sometimes a little higher  Evening: < 130, sometimes a little higher  Before meals: < 130 on average Taking Insulin?: yes  Long acting insulin:  Short acting insulin: Blood Pressure Monitoring: not checking Retinal Examination: Up to Date Foot Exam: Up to Date Pneumovax: Up to Date Influenza: Up to Date Aspirin: yes   HYPERTENSION / HYPERLIPIDEMIA Continues on Losartan 100 MG, HCTZ 25 MG daily, Carvedilol 25 MG BID, Amlodipine 10 MG daily.  Takes Lipitor 20 MG. Satisfied with current treatment? yes Duration of hypertension: chronic BP monitoring frequency: not checking BP range:  BP medication side effects: no Duration of hyperlipidemia: chronic Cholesterol medication side effects: no Cholesterol supplements: none Medication compliance: good compliance Aspirin: yes Recent  stressors: no Recurrent headaches: no Visual changes: no Palpitations: no Dyspnea: no Chest pain: no Lower extremity edema: no Dizzy/lightheaded: no   GOUT Continues on Allopurinol 300 MG daily. Swelling: no Redness: no Trauma: no Recent dietary change or indiscretion: no Fevers: no Nausea/vomiting: no Aggravating factors: foods Alleviating factors: Allopurinol Status:  stable Treatments attempted: Allopurinol  Relevant past medical, surgical, family and social history reviewed and updated as indicated. Interim medical history since our last visit reviewed. Allergies and medications reviewed and updated.  Review of Systems  Constitutional: Negative for activity change, diaphoresis, fatigue and fever.  Respiratory: Negative for cough, chest tightness, shortness of breath and wheezing.   Cardiovascular: Negative for chest pain, palpitations and leg swelling.  Gastrointestinal: Negative.   Endocrine: Negative for cold intolerance, heat intolerance, polydipsia, polyphagia and polyuria.  Neurological: Negative.   Psychiatric/Behavioral: Negative.     Per HPI unless specifically indicated above     Objective:    BP 134/68 (BP Location: Left Arm, Patient Position: Sitting)   Pulse 73   Temp 97.8 F (36.6 C) (Oral)   SpO2 98%   Wt Readings from Last 3 Encounters:  08/19/18 (!) 315 lb (142.9 kg)  03/21/18 (!) 317 lb (143.8 kg)  12/27/17 (!) 310 lb 3.2 oz (140.7 kg)    Physical Exam Vitals and nursing note reviewed.  Constitutional:      General: He is awake. He is not in acute distress.    Appearance: He is well-developed. He is morbidly obese. He is not ill-appearing.  HENT:     Head: Normocephalic and atraumatic.     Right Ear: Hearing normal. No drainage.     Left Ear: Hearing normal. No drainage.  Eyes:     General: Lids are normal.        Right eye: No discharge.        Left eye: No discharge.     Conjunctiva/sclera: Conjunctivae normal.     Pupils: Pupils  are equal, round, and reactive to light.  Neck:     Vascular: No carotid bruit.  Cardiovascular:     Rate and Rhythm: Normal rate and regular rhythm.     Heart sounds: Normal heart sounds, S1 normal and S2 normal. No murmur. No gallop.   Pulmonary:     Effort: Pulmonary effort is normal. No accessory muscle usage or respiratory distress.     Breath sounds: Normal breath sounds.  Abdominal:     General: Bowel sounds are normal.     Palpations: Abdomen is soft.     Tenderness: There is no abdominal tenderness.  Musculoskeletal:        General: Normal range of motion.     Cervical back: Normal range of motion and neck supple.     Right lower leg: 1+ Pitting Edema present.     Left lower leg: 1+ Pitting Edema present.  Skin:    General: Skin is warm and dry.     Capillary Refill: Capillary refill takes less than 2 seconds.     Findings: Lesion present.       Neurological:     Mental Status: He is alert and oriented to person, place, and time.     Deep Tendon Reflexes: Reflexes are normal and symmetric.  Psychiatric:        Mood and Affect: Mood normal.        Behavior: Behavior normal. Behavior is cooperative.        Thought Content: Thought content normal.        Judgment: Judgment normal.    Diabetic Foot Exam - Simple   Simple Foot Form Visual Inspection See comments: Yes Sensation Testing Intact to touch and monofilament testing bilaterally: Yes Pulse Check Posterior Tibialis and Dorsalis pulse intact bilaterally: Yes Comments Mild edema to bilateral ankle, 1+.  Thick toenails and mild xerosis bilateral feet.     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)    Chronic, ongoing.  Initial BP elevated,  with repeat closer to goal.  Recommend he check BP at home twice daily and document for provider visits.  Continue current medication regimen at this time, is on max dose multiple medications.  May need to adjust in future and change HCTZ to Lasix OR add on Hydralazine if elevations noted.  CMP today.  Return in 4 months for physical.  Refills sent in.      Relevant Medications   amLODipine (NORVASC) 10 MG tablet   losartan (COZAAR) 100 MG tablet   atorvastatin (LIPITOR) 20 MG tablet   carvedilol (COREG) 25 MG tablet   hydrochlorothiazide (HYDRODIURIL) 25 MG tablet   Other Relevant Orders   Comprehensive metabolic panel     Endocrine   Diabetes mellitus  associated with hormonal etiology ([place]) - Primary    Chronic, ongoing, followed by endocrinology with insulin pump in place.  Continue current collaboration and pump settings per endo.  Recent A1C 7%.  Recommend focus on diet and modest weight loss.  Does endorse poor diet.  Return in 4 months for physical.      Relevant Medications   losartan (COZAAR) 100 MG tablet   atorvastatin (LIPITOR) 20 MG tablet   Hyperlipidemia associated with type 2 diabetes mellitus (H[facility])    Chronic, ongoing.  Continue current medication regimen and adjust as needed.  Lipid panel today.  Refills sent in.      Relevant Medications   losartan (COZAAR) 100 MG tablet   atorvastatin (LIPITOR) 20 MG tablet   Other Relevant Orders   Lipid Panel w/o Chol/HDL Ratio     Musculoskeletal and Integument   Skin lesion    Referral to dermatology per request, noted skin lesion on exam suspect actinic keratosis.      Relevant Orders   Ambulatory referral to Dermatology     Other   Gout    Chronic, stable.  Continue current medication regimen and adjust as needed.  Uric acid level today.  May need to adjust dose if kidney function decline ever present.      Relevant Orders   Uric acid   BMI 40.0-44.9, adult ([place])    Recommend focus on modest weight loss with  diet changes and regular exercise regimen.        Morbid obesity ([place])    Recommend continued focus on healthy diet choices and regular physical activity (30 minutes 5 days a week).  Focus on small, achievable goals and set timelines.          Follow up plan: Return in about 4 months (around 10/30/2019) for Annual physical.

## 2019-07-02 NOTE — Patient Instructions (Signed)
Pneumococcal Vaccine, Polyvalent solution for injection What is this medicine? PNEUMOCOCCAL VACCINE, POLYVALENT (NEU mo KOK al vak SEEN, pol ee VEY luhnt) is a vaccine to prevent pneumococcus bacteria infection. These bacteria are a major cause of ear infections, Strep throat infections, and serious pneumonia, meningitis, or blood infections worldwide. These vaccines help the body to produce antibodies (protective substances) that help your body defend against these bacteria. This vaccine is recommended for people 2 years of age and older with health problems. It is also recommended for all adults over 50 years old. This vaccine will not treat an infection. This medicine may be used for other purposes; ask your health care provider or pharmacist if you have questions. COMMON BRAND NAME(S): Pneumovax 23 What should I tell my health care provider before I take this medicine? They need to know if you have any of these conditions:  bleeding problems  bone marrow or organ transplant  cancer, Hodgkin's disease  fever  infection  immune system problems  low platelet count in the blood  seizures  an unusual or allergic reaction to pneumococcal vaccine, diphtheria toxoid, other vaccines, latex, other medicines, foods, dyes, or preservatives  pregnant or trying to get pregnant  breast-feeding How should I use this medicine? This vaccine is for injection into a muscle or under the skin. It is given by a health care professional. A copy of Vaccine Information Statements will be given before each vaccination. Read this sheet carefully each time. The sheet may change frequently. Talk to your pediatrician regarding the use of this medicine in children. While this drug may be prescribed for children as young as 2 years of age for selected conditions, precautions do apply. Overdosage: If you think you have taken too much of this medicine contact a poison control center or emergency room at  once. NOTE: This medicine is only for you. Do not share this medicine with others. What if I miss a dose? It is important not to miss your dose. Call your doctor or health care professional if you are unable to keep an appointment. What may interact with this medicine?  medicines for cancer chemotherapy  medicines that suppress your immune function  medicines that treat or prevent blood clots like warfarin, enoxaparin, and dalteparin  steroid medicines like prednisone or cortisone This list may not describe all possible interactions. Give your health care provider a list of all the medicines, herbs, non-prescription drugs, or dietary supplements you use. Also tell them if you smoke, drink alcohol, or use illegal drugs. Some items may interact with your medicine. What should I watch for while using this medicine? Mild fever and pain should go away in 3 days or less. Report any unusual symptoms to your doctor or health care professional. What side effects may I notice from receiving this medicine? Side effects that you should report to your doctor or health care professional as soon as possible:  allergic reactions like skin rash, itching or hives, swelling of the face, lips, or tongue  breathing problems  confused  fever over 102 degrees F  pain, tingling, numbness in the hands or feet  seizures  unusual bleeding or bruising  unusual muscle weakness Side effects that usually do not require medical attention (report to your doctor or health care professional if they continue or are bothersome):  aches and pains  diarrhea  fever of 102 degrees F or less  headache  irritable  loss of appetite  pain, tender at site where injected    trouble sleeping This list may not describe all possible side effects. Call your doctor for medical advice about side effects. You may report side effects to FDA at 1-800-FDA-1088. Where should I keep my medicine? This does not apply. This  vaccine is given in a clinic, pharmacy, doctor's office, or other health care setting and will not be stored at home. NOTE: This sheet is a summary. It may not cover all possible information. If you have questions about this medicine, talk to your doctor, pharmacist, or health care provider.  2020 Elsevier/Gold Standard (2007-12-13 14:32:37)  

## 2019-07-02 NOTE — Assessment & Plan Note (Signed)
Referral to dermatology per request, noted skin lesion on exam suspect actinic keratosis.

## 2019-07-02 NOTE — Assessment & Plan Note (Signed)
Recommend focus on modest weight loss with diet changes and regular exercise regimen.   

## 2019-07-02 NOTE — Assessment & Plan Note (Addendum)
Chronic, ongoing.  Continue current medication regimen and adjust as needed.  Lipid panel today.  Refills sent in. 

## 2019-07-02 NOTE — Assessment & Plan Note (Signed)
Recommend continued focus on healthy diet choices and regular physical activity (30 minutes 5 days a week).  Focus on small, achievable goals and set timelines.

## 2019-07-02 NOTE — Assessment & Plan Note (Addendum)
Chronic, ongoing.  Initial BP elevated, with repeat closer to goal.  Recommend he check BP at home twice daily and document for provider visits.  Continue current medication regimen at this time, is on max dose multiple medications.  May need to adjust in future and change HCTZ to Lasix OR add on Hydralazine if elevations noted.  CMP today.  Return in 4 months for physical.  Refills sent in.

## 2019-07-02 NOTE — Assessment & Plan Note (Signed)
Chronic, ongoing, followed by endocrinology with insulin pump in place.  Continue current collaboration and pump settings per endo.  Recent A1C 7%.  Recommend focus on diet and modest weight loss.  Does endorse poor diet.  Return in 4 months for physical.

## 2019-07-02 NOTE — Assessment & Plan Note (Signed)
Chronic, stable.  Continue current medication regimen and adjust as needed.  Uric acid level today.  May need to adjust dose if kidney function decline ever present.

## 2019-07-03 LAB — COMPREHENSIVE METABOLIC PANEL
ALT: 13 IU/L (ref 0–44)
AST: 17 IU/L (ref 0–40)
Albumin/Globulin Ratio: 1.4 (ref 1.2–2.2)
Albumin: 4.2 g/dL (ref 3.8–4.8)
Alkaline Phosphatase: 144 IU/L — ABNORMAL HIGH (ref 39–117)
BUN/Creatinine Ratio: 13 (ref 10–24)
BUN: 15 mg/dL (ref 8–27)
Bilirubin Total: 0.3 mg/dL (ref 0.0–1.2)
CO2: 24 mmol/L (ref 20–29)
Calcium: 9.7 mg/dL (ref 8.6–10.2)
Chloride: 102 mmol/L (ref 96–106)
Creatinine, Ser: 1.19 mg/dL (ref 0.76–1.27)
GFR calc Af Amer: 73 mL/min/{1.73_m2} (ref >59)
GFR calc non Af Amer: 63 mL/min/{1.73_m2} (ref >59)
Globulin, Total: 3.1 g/dL (ref 1.5–4.5)
Glucose: 146 mg/dL — ABNORMAL HIGH (ref 65–99)
Potassium: 4.5 mmol/L (ref 3.5–5.2)
Sodium: 142 mmol/L (ref 134–144)
Total Protein: 7.3 g/dL (ref 6.0–8.5)

## 2019-07-03 LAB — LIPID PANEL W/O CHOL/HDL RATIO
Cholesterol, Total: 94 mg/dL — ABNORMAL LOW (ref 100–199)
HDL: 31 mg/dL — ABNORMAL LOW (ref >39)
LDL Chol Calc (NIH): 40 mg/dL (ref 0–99)
Triglycerides: 132 mg/dL (ref 0–149)
VLDL Cholesterol Cal: 23 mg/dL (ref 5–40)

## 2019-07-03 LAB — URIC ACID: Uric Acid: 5 mg/dL (ref 3.8–8.4)

## 2019-07-03 NOTE — Progress Notes (Signed)
Contacted via MyChart Good afternoon Aaron [name].  Your labs have returned.  Kidney and liver function look good.  Mild elevation in alkaline phosphatase, this can sometimes be related to gallbladder, we will continue to monitor this.  Cholesterol levels are well below goal and uric acid level is normal.  Have a great day!!

## 2019-07-11 ENCOUNTER — Ambulatory Visit: Payer: Self-pay | Admitting: Pharmacist

## 2019-07-11 DIAGNOSIS — E1169 Type 2 diabetes mellitus with other specified complication: Secondary | ICD-10-CM

## 2019-07-11 NOTE — Chronic Care Management (AMB) (Signed)
Chronic Care Management   Follow Up Note   07/11/2019 Name: Aaron [name] MRN: [id number] DOB: 1951/05/12  Referred by: [name], MD Reason for referral : Chronic Care Management (Medication Management)   Aaron [name] is a 69 y.o. year old male who is a primary care patient of [name], [name], MD. The CCM team was consulted for assistance with chronic disease management and care coordination needs.    Care coordination completed today.   Review of patient status, including review of consultants reports, relevant laboratory and other test results, and collaboration with appropriate care team members and the patient's provider was performed as part of comprehensive patient evaluation and provision of chronic care management services.    SDOH (Social Determinants of Health) assessments performed: No    Outpatient Encounter Medications as of 07/11/2019  Medication Sig  . acetaminophen (TYLENOL) 500 MG tablet Take 1-2 tablets (500-1,000 mg total) by mouth every 6 (six) hours as needed for mild pain (pain score 1-3 or temp > 100.5).  Marland Kitchen allopurinol (ZYLOPRIM) 300 MG tablet Take 1 tablet (300 mg total) by mouth daily.  Marland Kitchen amLODipine (NORVASC) 10 MG tablet Take 1 tablet (10 mg total) by mouth daily.  . ASPIRIN 81 PO   . atorvastatin (LIPITOR) 20 MG tablet Take 1 tablet (20 mg total) by mouth daily.  . carvedilol (COREG) 25 MG tablet Take 1 tablet (25 mg total) by mouth 2 (two) times daily with a meal.  . hydrochlorothiazide (HYDRODIURIL) 25 MG tablet Take 1 tablet (25 mg total) by mouth daily.  . insulin aspart (NOVOLOG) 100 UNIT/ML injection Inject into the skin. Using via insulin pump  . losartan (COZAAR) 100 MG tablet Take 1 tablet (100 mg total) by mouth daily.   No facility-administered encounter medications on file as of 07/11/2019.     Objective:   Goals Addressed            This Visit's Progress     Patient Stated   . "I want to stay healthy" (pt-stated)       Current Barriers:  . Diabetes: controlled; most recent A1c 7% after restarting insulin pump therapy o APPROVED for Novolog patient assistance through 05/21/20.  . Current antihyperglycemic regimen: Novolog via pump . Current BG readings: checking up to five times daily; average BG ~140 per last visit w/ Dr. Gabriel Carina . Cardiovascular risk reduction: o Current hypertensive regimen: amlodipine 10 mg daily, carvedilol 25 mg BID, losartan 100 mg daily, HCTZ 25 mg daily; not checking BP at home, but BP remains well controlled at recent clinic visits  o Current hyperlipidemia regimen: atorvastatin 20 mg daily; LDL at goal <70 on last check per Care Everywhere o Current antiplatelet regimen: ASA 81 mg daily   Pharmacist Clinical Goal(s):  Marland Kitchen Over the next 90 days, patient with work with PharmD and primary care provider to address maintained glycemic control  Interventions: . [facility] to see if they have received Novolog shipment yet. Left message for RN to give me a call back.   Patient Self Care Activities:  . Patient will check blood glucose regularly, document, and provide at future appointments . Patient will take medications as prescribed . Patient will report any questions or concerns to provider   Please see past updates related to this goal by clicking on the "Past Updates" button in the selected goal          Plan:  - Will await call back from [facility] End[facility] -  Will outreach patient as previously scheduled  [name], PharmD, [place] [phone]

## 2019-07-11 NOTE — Patient Instructions (Signed)
Visit Information  Goals Addressed            This Visit's Progress     Patient Stated   . "I want to stay healthy" (pt-stated)       Current Barriers:  . Diabetes: controlled; most recent A1c 7% after restarting insulin pump therapy o APPROVED for Novolog patient assistance through 05/21/20.  . Current antihyperglycemic regimen: Novolog via pump . Current BG readings: checking up to five times daily; average BG ~140 per last visit w/ Dr. Gabriel Carina . Cardiovascular risk reduction: o Current hypertensive regimen: amlodipine 10 mg daily, carvedilol 25 mg BID, losartan 100 mg daily, HCTZ 25 mg daily; not checking BP at home, but BP remains well controlled at recent clinic visits  o Current hyperlipidemia regimen: atorvastatin 20 mg daily; LDL at goal <70 on last check per Care Everywhere o Current antiplatelet regimen: ASA 81 mg daily   Pharmacist Clinical Goal(s):  Marland Kitchen Over the next 90 days, patient with work with PharmD and primary care provider to address maintained glycemic control  Interventions: . [facility] to see if they have received Novolog shipment yet. Left message for RN to give me a call back.   Patient Self Care Activities:  . Patient will check blood glucose regularly, document, and provide at future appointments . Patient will take medications as prescribed . Patient will report any questions or concerns to provider   Please see past updates related to this goal by clicking on the "Past Updates" button in the selected goal         Patient verbalizes understanding of instructions provided today.   Plan:  - Will await call back from [facility] Endocrinology - Will outreach patient as previously scheduled  [name], PharmD, [place] [phone]

## 2019-07-15 ENCOUNTER — Ambulatory Visit (INDEPENDENT_AMBULATORY_CARE_PROVIDER_SITE_OTHER): Payer: Medicare HMO | Admitting: Pharmacist

## 2019-07-15 DIAGNOSIS — E1169 Type 2 diabetes mellitus with other specified complication: Secondary | ICD-10-CM | POA: Diagnosis not present

## 2019-07-15 NOTE — Patient Instructions (Signed)
Visit Information  Goals Addressed            This Visit's Progress     Patient Stated   . "I want to stay healthy" (pt-stated)       Current Barriers:  . Diabetes: controlled; most recent A1c 7% after restarting insulin pump therapy o APPROVED for Novolog patient assistance through 05/21/20. However, has not received his insulin supply yet. Calls to see if I have any updated information on shipping o Per call to Dr. Joycie Peek office on 07/11/19, medication had not arrived . Current antihyperglycemic regimen: Novolog via pump . Cardiovascular risk reduction: o Current hypertensive regimen: amlodipine 10 mg daily, carvedilol 25 mg BID, losartan 100 mg daily, HCTZ 25 mg daily; not checking BP at home, but BP remains well controlled at recent clinic visits  o Current hyperlipidemia regimen: atorvastatin 20 mg daily; LDL at goal <70 on last check per Care Everywhere o Current antiplatelet regimen: ASA 81 mg daily   Pharmacist Clinical Goal(s):  Marland Kitchen Over the next 90 days, patient with work with PharmD and primary care provider to address maintained glycemic control  Interventions: . [employer], though wait time was >2 hours and by end of business today, I have not spoke with a representative to review shipping information for patient's Novolog order.  . Provided [name] phone number to patient; he noted in a voicemail back to me that he also placed a call to them and is awaiting a call back. He will update me when he has spoke with the company.   Patient Self Care Activities:  . Patient will check blood glucose regularly, document, and provide at future appointments . Patient will take medications as prescribed . Patient will report any questions or concerns to provider   Please see past updates related to this goal by clicking on the "Past Updates" button in the selected goal         Patient verbalizes understanding of instructions provided today.   Plan:  - Will  continue to collaborate w/ patient as above  [name], PharmD, [place] [phone]

## 2019-07-15 NOTE — Chronic Care Management (AMB) (Signed)
Chronic Care Management   Follow Up Note   07/15/2019 Name: Aaron [name] MRN: [id number] DOB: 1951-04-19  Referred by: [name], MD Reason for referral : Chronic Care Management (Medication Management)   Aaron [name] is a 69 y.o. year old male who is a primary care patient of [name], [name], MD. The CCM team was consulted for assistance with chronic disease management and care coordination needs.    Contacted patient for medication management review.  Review of patient status, including review of consultants reports, relevant laboratory and other test results, and collaboration with appropriate care team members and the patient's provider was performed as part of comprehensive patient evaluation and provision of chronic care management services.    SDOH (Social Determinants of Health) assessments performed: No See Care Plan activities for detailed interventions related to [facility])     Outpatient Encounter Medications as of 07/15/2019  Medication Sig  . acetaminophen (TYLENOL) 500 MG tablet Take 1-2 tablets (500-1,000 mg total) by mouth every 6 (six) hours as needed for mild pain (pain score 1-3 or temp > 100.5).  Marland Kitchen allopurinol (ZYLOPRIM) 300 MG tablet Take 1 tablet (300 mg total) by mouth daily.  Marland Kitchen amLODipine (NORVASC) 10 MG tablet Take 1 tablet (10 mg total) by mouth daily.  . ASPIRIN 81 PO   . atorvastatin (LIPITOR) 20 MG tablet Take 1 tablet (20 mg total) by mouth daily.  . carvedilol (COREG) 25 MG tablet Take 1 tablet (25 mg total) by mouth 2 (two) times daily with a meal.  . hydrochlorothiazide (HYDRODIURIL) 25 MG tablet Take 1 tablet (25 mg total) by mouth daily.  . insulin aspart (NOVOLOG) 100 UNIT/ML injection Inject into the skin. Using via insulin pump  . losartan (COZAAR) 100 MG tablet Take 1 tablet (100 mg total) by mouth daily.   No facility-administered encounter medications on file as of 07/15/2019.     Objective:   Goals Addressed            This Visit's Progress     Patient Stated   . "I want to stay healthy" (pt-stated)       Current Barriers:  . Diabetes: controlled; most recent A1c 7% after restarting insulin pump therapy o APPROVED for Novolog patient assistance through 05/21/20. However, has not received his insulin supply yet. Calls to see if I have any updated information on shipping o Per call to Dr. Joycie Peek office on 07/11/19, medication had not arrived . Current antihyperglycemic regimen: Novolog via pump . Cardiovascular risk reduction: o Current hypertensive regimen: amlodipine 10 mg daily, carvedilol 25 mg BID, losartan 100 mg daily, HCTZ 25 mg daily; not checking BP at home, but BP remains well controlled at recent clinic visits  o Current hyperlipidemia regimen: atorvastatin 20 mg daily; LDL at goal <70 on last check per Care Everywhere o Current antiplatelet regimen: ASA 81 mg daily   Pharmacist Clinical Goal(s):  Marland Kitchen Over the next 90 days, patient with work with PharmD and primary care provider to address maintained glycemic control  Interventions: . [employer], though wait time was >2 hours and by end of business today, I have not spoke with a representative to review shipping information for patient's Novolog order.  . Provided [name] phone number to patient; he noted in a voicemail back to me that he also placed a call to them and is awaiting a call back. He will update me when he has spoke with the company.   Patient Self  Care Activities:  . Patient will check blood glucose regularly, document, and provide at future appointments . Patient will take medications as prescribed . Patient will report any questions or concerns to provider   Please see past updates related to this goal by clicking on the "Past Updates" button in the selected goal          Plan:  - Will continue to collaborate w/ patient as above  [name], PharmD, [place] [phone]

## 2019-07-17 ENCOUNTER — Other Ambulatory Visit: Payer: Self-pay | Admitting: Pharmacy Technician

## 2019-07-17 NOTE — Patient Outreach (Signed)
[facility]) Care Management  07/17/2019  Aaron [name] 01/07/1951 [id number]   Successful call placed to patient regarding patient assistance medication delivery of Novolog with [name], HIPAA identifiers verified.   Patient informed he has not received a call from the provider's office as of this morning. Informed patient the automated message said his medication would be delivered this week. Informed patient to call me if he has not heard form the provider's office by next week.  Follow up:  Will follow up with patient in 5-7 business days to inquire about receipt of medication.  Tenecia Ignasiak P. Kurt Azimi, [place] Management [phone]

## 2019-07-23 ENCOUNTER — Ambulatory Visit: Payer: Self-pay | Admitting: Pharmacist

## 2019-07-23 DIAGNOSIS — E1169 Type 2 diabetes mellitus with other specified complication: Secondary | ICD-10-CM

## 2019-07-23 NOTE — Patient Instructions (Signed)
Visit Information  Goals Addressed            This Visit's Progress     Patient Stated   . "I want to stay healthy" (pt-stated)       CARE PLAN ENTRY (see longtitudinal plan of care for additional care plan information)  Current Barriers:  . Diabetes: controlled; most recent A1c 7% after restarting insulin pump therapy o APPROVED for Novolog patient assistance through 05/21/20. However, has not received his insulin supply yet.  o [employer] last week, was told by a rep that they did not have any shipping information to give him. Due to recent weather/power outages, they were having delays in shipping o Notes he called the automated system earlier this week, was told his order shipped on 07/15/19 . Current antihyperglycemic regimen: Novolog via pump . Cardiovascular risk reduction: o Current hypertensive regimen: amlodipine 10 mg daily, carvedilol 25 mg BID, losartan 100 mg daily, HCTZ 25 mg daily; not checking BP at home, but BP remains well controlled at recent clinic visits  o Current hyperlipidemia regimen: atorvastatin 20 mg daily; LDL at goal <70 on last check per Care Everywhere o Current antiplatelet regimen: ASA 81 mg daily   Pharmacist Clinical Goal(s):  Marland Kitchen Over the next 90 days, patient with work with PharmD and primary care provider to address maintained glycemic control  Interventions: . Contacted patient. Discussed current shipping problems with many patient assistance medication companies, and apologized for him having to purchase insulin from the pharmacy. If he has not received his insulin by end of next week (which would be the ~10 business days) to give me a call and we can call Novo to see if they can reship.  Marland Kitchen He will also call Dr. Joycie Peek office and ask that they call him immediately as soon as they receive his supply of medications.    Patient Self Care Activities:  . Patient will check blood glucose regularly, document, and provide at future  appointments . Patient will take medications as prescribed . Patient will report any questions or concerns to provider   Please see past updates related to this goal by clicking on the "Past Updates" button in the selected goal         Patient verbalizes understanding of instructions provided today.   Plan:  - Will continue to collaborate w/ patient and CPhT - Will outreach patient as previously scheduled  [name], PharmD, [place] [phone]

## 2019-07-23 NOTE — Chronic Care Management (AMB) (Signed)
Chronic Care Management   Follow Up Note   07/23/2019 Name: Aaron [name] MRN: [id number] DOB: June 08, 1950  Referred by: [name], MD Reason for referral : Chronic Care Management (Medication Management)   Aaron [name] is a 69 y.o. year old male who is a primary care patient of [name], [name], MD. The CCM team was consulted for assistance with chronic disease management and care coordination needs.    Care coordination.   Review of patient status, including review of consultants reports, relevant laboratory and other test results, and collaboration with appropriate care team members and the patient's provider was performed as part of comprehensive patient evaluation and provision of chronic care management services.    SDOH (Social Determinants of Health) assessments performed: Yes See Care Plan activities for detailed interventions related to SDOH)  SDOH Interventions     Most Recent Value  SDOH Interventions  SDOH Interventions for the Following Domains  Financial Strain  Financial Strain Interventions  Other (Comment) [Patient assistance]       Outpatient Encounter Medications as of 07/23/2019  Medication Sig  . acetaminophen (TYLENOL) 500 MG tablet Take 1-2 tablets (500-1,000 mg total) by mouth every 6 (six) hours as needed for mild pain (pain score 1-3 or temp > 100.5).  Marland Kitchen allopurinol (ZYLOPRIM) 300 MG tablet Take 1 tablet (300 mg total) by mouth daily.  Marland Kitchen amLODipine (NORVASC) 10 MG tablet Take 1 tablet (10 mg total) by mouth daily.  . ASPIRIN 81 PO   . atorvastatin (LIPITOR) 20 MG tablet Take 1 tablet (20 mg total) by mouth daily.  . carvedilol (COREG) 25 MG tablet Take 1 tablet (25 mg total) by mouth 2 (two) times daily with a meal.  . hydrochlorothiazide (HYDRODIURIL) 25 MG tablet Take 1 tablet (25 mg total) by mouth daily.  . insulin aspart (NOVOLOG) 100 UNIT/ML injection Inject into the skin. Using via insulin pump  . losartan (COZAAR) 100 MG tablet Take 1  tablet (100 mg total) by mouth daily.   No facility-administered encounter medications on file as of 07/23/2019.     Objective:   Goals Addressed            This Visit's Progress     Patient Stated   . "I want to stay healthy" (pt-stated)       CARE PLAN ENTRY (see longtitudinal plan of care for additional care plan information)  Current Barriers:  . Diabetes: controlled; most recent A1c 7% after restarting insulin pump therapy o APPROVED for Novolog patient assistance through 05/21/20. However, has not received his insulin supply yet.  o [employer] last week, was told by a rep that they did not have any shipping information to give him. Due to recent weather/power outages, they were having delays in shipping o Notes he called the automated system earlier this week, was told his order shipped on 07/15/19 . Current antihyperglycemic regimen: Novolog via pump . Cardiovascular risk reduction: o Current hypertensive regimen: amlodipine 10 mg daily, carvedilol 25 mg BID, losartan 100 mg daily, HCTZ 25 mg daily; not checking BP at home, but BP remains well controlled at recent clinic visits  o Current hyperlipidemia regimen: atorvastatin 20 mg daily; LDL at goal <70 on last check per Care Everywhere o Current antiplatelet regimen: ASA 81 mg daily   Pharmacist Clinical Goal(s):  Marland Kitchen Over the next 90 days, patient with work with PharmD and primary care provider to address maintained glycemic control  Interventions: . Contacted patient. Discussed current  shipping problems with many patient assistance medication companies, and apologized for him having to purchase insulin from the pharmacy. If he has not received his insulin by end of next week (which would be the ~10 business days) to give me a call and we can call Novo to see if they can reship.  Marland Kitchen He will also call Dr. Joycie Peek office and ask that they call him immediately as soon as they receive his supply of medications.    Patient Self  Care Activities:  . Patient will check blood glucose regularly, document, and provide at future appointments . Patient will take medications as prescribed . Patient will report any questions or concerns to provider   Please see past updates related to this goal by clicking on the "Past Updates" button in the selected goal          Plan:  - Will continue to collaborate w/ patient and CPhT - Will outreach patient as previously scheduled  [name], PharmD, [place] [phone]

## 2019-08-04 ENCOUNTER — Ambulatory Visit: Payer: Self-pay | Admitting: Pharmacist

## 2019-08-04 ENCOUNTER — Other Ambulatory Visit: Payer: Self-pay | Admitting: Pharmacy Technician

## 2019-08-04 DIAGNOSIS — E1169 Type 2 diabetes mellitus with other specified complication: Secondary | ICD-10-CM

## 2019-08-04 NOTE — Chronic Care Management (AMB) (Signed)
Chronic Care Management   Follow Up Note   08/04/2019 Name: Aaron [name] MRN: [id number] DOB: 04-18-1951  Referred by: [name], MD Reason for referral : Chronic Care Management (Medication Management)   Aaron [name] is a 69 y.o. year old male who is a primary care patient of [name], [name], MD. The CCM team was consulted for assistance with chronic disease management and care coordination needs.    Care coordination completed today.   Review of patient status, including review of consultants reports, relevant laboratory and other test results, and collaboration with appropriate care team members and the patient's provider was performed as part of comprehensive patient evaluation and provision of chronic care management services.    SDOH (Social Determinants of Health) assessments performed: Yes See Care Plan activities for detailed interventions related to SDOH)  SDOH Interventions     Most Recent Value  SDOH Interventions  SDOH Interventions for the Following Domains  Financial Strain  Financial Strain Interventions  Other (Comment) [patient assistance]       Outpatient Encounter Medications as of 08/04/2019  Medication Sig  . acetaminophen (TYLENOL) 500 MG tablet Take 1-2 tablets (500-1,000 mg total) by mouth every 6 (six) hours as needed for mild pain (pain score 1-3 or temp > 100.5).  Marland Kitchen allopurinol (ZYLOPRIM) 300 MG tablet Take 1 tablet (300 mg total) by mouth daily.  Marland Kitchen amLODipine (NORVASC) 10 MG tablet Take 1 tablet (10 mg total) by mouth daily.  . ASPIRIN 81 PO   . atorvastatin (LIPITOR) 20 MG tablet Take 1 tablet (20 mg total) by mouth daily.  . carvedilol (COREG) 25 MG tablet Take 1 tablet (25 mg total) by mouth 2 (two) times daily with a meal.  . hydrochlorothiazide (HYDRODIURIL) 25 MG tablet Take 1 tablet (25 mg total) by mouth daily.  . insulin aspart (NOVOLOG) 100 UNIT/ML injection Inject into the skin. Using via insulin pump  . losartan (COZAAR) 100  MG tablet Take 1 tablet (100 mg total) by mouth daily.   No facility-administered encounter medications on file as of 08/04/2019.     Objective:   Goals Addressed            This Visit's Progress     Patient Stated   . "I want to stay healthy" (pt-stated)       CARE PLAN ENTRY (see longtitudinal plan of care for additional care plan information)  Current Barriers:  . Diabetes: controlled; most recent A1c 7% after restarting insulin pump therapy o APPROVED for Novolog patient assistance through 05/21/20. Difficulties receiving order.  . Current antihyperglycemic regimen: Novolog via pump . Cardiovascular risk reduction: o Current hypertensive regimen: amlodipine 10 mg daily, carvedilol 25 mg BID, losartan 100 mg daily, HCTZ 25 mg daily; not checking BP at home, but BP remains well controlled at recent clinic visits  o Current hyperlipidemia regimen: atorvastatin 20 mg daily; LDL at goal <70 on last check per Care Everywhere o Current antiplatelet regimen: ASA 81 mg daily   Pharmacist Clinical Goal(s):  Marland Kitchen Over the next 90 days, patient with work with PharmD and primary care provider to address maintained glycemic control  Interventions: . Per message from CPhT per [name], medication arrived to the office on 07/16/19 and signed by Laurance Flatten. Contacted [facility] - spoke with Hassan Rowan, Therapist, sports. She confirmed medication had arrived, and she was unsure why nobody had called the patient yet. She will do so today. I will inform CPhT.   Patient Self Care  Activities:  . Patient will check blood glucose regularly, document, and provide at future appointments . Patient will take medications as prescribed . Patient will report any questions or concerns to provider   Please see past updates related to this goal by clicking on the "Past Updates" button in the selected goal          Plan:  - Will outreach as previously scheduled  [name], PharmD, [place] [phone]

## 2019-08-04 NOTE — Patient Instructions (Signed)
Visit Information  Goals Addressed            This Visit's Progress     Patient Stated   . "I want to stay healthy" (pt-stated)       CARE PLAN ENTRY (see longtitudinal plan of care for additional care plan information)  Current Barriers:  . Diabetes: controlled; most recent A1c 7% after restarting insulin pump therapy o APPROVED for Novolog patient assistance through 05/21/20. Difficulties receiving order.  . Current antihyperglycemic regimen: Novolog via pump . Cardiovascular risk reduction: o Current hypertensive regimen: amlodipine 10 mg daily, carvedilol 25 mg BID, losartan 100 mg daily, HCTZ 25 mg daily; not checking BP at home, but BP remains well controlled at recent clinic visits  o Current hyperlipidemia regimen: atorvastatin 20 mg daily; LDL at goal <70 on last check per Care Everywhere o Current antiplatelet regimen: ASA 81 mg daily   Pharmacist Clinical Goal(s):  Marland Kitchen Over the next 90 days, patient with work with PharmD and primary care provider to address maintained glycemic control  Interventions: . Per message from CPhT per [name], medication arrived to the office on 07/16/19 and signed by Laurance Flatten. Contacted [facility] - spoke with Hassan Rowan, Therapist, sports. She confirmed medication had arrived, and she was unsure why nobody had called the patient yet. She will do so today. I will inform CPhT.   Patient Self Care Activities:  . Patient will check blood glucose regularly, document, and provide at future appointments . Patient will take medications as prescribed . Patient will report any questions or concerns to provider   Please see past updates related to this goal by clicking on the "Past Updates" button in the selected goal         Patient verbalizes understanding of instructions provided today.   Plan:  - Will outreach as previously scheduled  [name], PharmD, [place] [phone]

## 2019-08-04 NOTE — Patient Outreach (Signed)
[facility]) Care Management  08/04/2019  ACHILLES ROZMUS Sep 25, 1950 [id number]   Successful call placed to patient regarding patient assistance medication delivery of Novolog, HIPAA identifiers verified.   Patient informed he has not received the medication.  Per [name] automated system the Novolog was delivered on 07/16/2019 and signed for by [employer].  [employer] and spoke to Snyder who confirms medication was delivered to [facility]. Inquired if it shows returned or anything and she informed the tracking shows delivery. It does not show returned or undeliverable. She informed the tracking number is [id number]. She also informed if the medication is not found then the provider only can write a letter on practice letterhead stating the patient's (include name) medication (include name) was not received at the provider's office. The letter will have to be signed by the prescribing provider and dated. This letter can then be faxed into [name] at [phone].  Will outreach embedded [place] for assistance.  Anwyn Kriegel P. Chastin Riesgo, [place]  [phone]

## 2019-08-04 NOTE — Patient Outreach (Signed)
[facility]) Care Management  08/04/2019  Aaron [name] 09/17/1950 [id number]   ADDENDUM   Successful call placed to patient regarding patient assistance medication delivery of Novolog with [name], HIPAA identifiers verified.   Was calling patient to update him that the medication was at the provider's office.  Spoke to patient who informed he was at the provider's office picking up the medication. Discussed refill procedure with patient which requires the provider's office to phone in the request to [name]. They will receive a fax when his refill is due. Novo Nord[employer] does not allow patients to phone in their own refills. Informed patient to outreach provider's office 2-3 weeks before running out of medication to request that they place the refill with the company. Patient verbalized understanding. Patient confirms having name and number and no further questions.  Follow up:  Will route note to embedded [place] for case closure and will remove myself from care team.  Luiz Ochoa. Elias Dennington, [place]  [phone]

## 2019-08-06 DIAGNOSIS — G4733 Obstructive sleep apnea (adult) (pediatric): Secondary | ICD-10-CM | POA: Diagnosis not present

## 2019-08-12 ENCOUNTER — Ambulatory Visit (INDEPENDENT_AMBULATORY_CARE_PROVIDER_SITE_OTHER): Payer: Medicare HMO | Admitting: Pharmacist

## 2019-08-12 DIAGNOSIS — E1169 Type 2 diabetes mellitus with other specified complication: Secondary | ICD-10-CM

## 2019-08-12 NOTE — Patient Instructions (Signed)
Visit Information  Goals Addressed            This Visit's Progress     Patient Stated   . "I want to stay healthy" (pt-stated)       CARE PLAN ENTRY (see longtitudinal plan of care for additional care plan information)  Current Barriers:  . Diabetes: controlled; most recent A1c 7% after restarting insulin pump therapy o APPROVED for Novolog patient assistance through 05/21/20. Finally arrived to Dr. Joycie Peek office; he has picked it up o F/u w/ Dr. Gabriel Carina scheduled 08/19/19 . Current antihyperglycemic regimen: Novolog via pump o Receiving Novolog vial assistance through [name] through 05/21/20 . Cardiovascular risk reduction: o Current hypertensive regimen: amlodipine 10 mg daily, carvedilol 25 mg BID, losartan 100 mg daily, HCTZ 25 mg daily; not checking BP at home, was recommended to do so per Dr. Wynetta Emery at last visit  o Current hyperlipidemia regimen: atorvastatin 20 mg daily; LDL at goal <70 on last check  o Current antiplatelet regimen: ASA 81 mg daily  . Gout: allopurinol 300 mg daily; uric acid at goal <6. No recent gout flares   Pharmacist Clinical Goal(s):  Marland Kitchen Over the next 90 days, patient with work with PharmD and primary care provider to address maintained glycemic control  Interventions: . Comprehensive medication review performed w/ fill history, medication list updated in electronic medical record . Reviewed Novo Nordisk refill procedure. Patient to contact Dr. Joycie Peek office to fax order into [name].  . Reviewed recommendation from PCP to check BP twice weekly. Counseled to investigate OTC benefits from [facility], and choose arm cuff over wrist cuff. Discussed goal SBP <140, ideally <130. Patient verbalizes understanding . If further BP reduction needed moving forward, consider switching HCTZ to chlorthalidone for greater potency, and also reduced risk of exacerbating gout.   Patient Self Care Activities:  . Patient will check blood glucose regularly,  document, and provide at future appointments . Patient will take medications as prescribed . Patient will report any questions or concerns to provider   Please see past updates related to this goal by clicking on the "Past Updates" button in the selected goal         Patient verbalizes understanding of instructions provided today.      Plan:  - Scheduled f/u call 11/11/19  [name], PharmD, [place] [phone]

## 2019-08-12 NOTE — Chronic Care Management (AMB) (Signed)
Chronic Care Management   Follow Up Note   08/12/2019 Name: Aaron [name] MRN: [id number] DOB: 04-May-1951  Referred by: [name], MD Reason for referral : Chronic Care Management (Medication Management)   Aaron [name] is a 69 y.o. year old male who is a primary care patient of [name], [name], MD. The CCM team was consulted for assistance with chronic disease management and care coordination needs.    Contacted patient for medication management review.   Review of patient status, including review of consultants reports, relevant laboratory and other test results, and collaboration with appropriate care team members and the patient's provider was performed as part of comprehensive patient evaluation and provision of chronic care management services.    SDOH (Social Determinants of Health) assessments performed: Yes See Care Plan activities for detailed interventions related to [facility])     Outpatient Encounter Medications as of 08/12/2019  Medication Sig  . allopurinol (ZYLOPRIM) 300 MG tablet Take 1 tablet (300 mg total) by mouth daily.  Marland Kitchen amLODipine (NORVASC) 10 MG tablet Take 1 tablet (10 mg total) by mouth daily.  Marland Kitchen acetaminophen (TYLENOL) 500 MG tablet Take 1-2 tablets (500-1,000 mg total) by mouth every 6 (six) hours as needed for mild pain (pain score 1-3 or temp > 100.5).  . ASPIRIN 81 PO   . atorvastatin (LIPITOR) 20 MG tablet Take 1 tablet (20 mg total) by mouth daily.  . carvedilol (COREG) 25 MG tablet Take 1 tablet (25 mg total) by mouth 2 (two) times daily with a meal.  . hydrochlorothiazide (HYDRODIURIL) 25 MG tablet Take 1 tablet (25 mg total) by mouth daily.  . insulin aspart (NOVOLOG) 100 UNIT/ML injection Inject into the skin. Using via insulin pump  . losartan (COZAAR) 100 MG tablet Take 1 tablet (100 mg total) by mouth daily.   No facility-administered encounter medications on file as of 08/12/2019.     Objective:   Goals Addressed            This Visit's Progress     Patient Stated   . "I want to stay healthy" (pt-stated)       CARE PLAN ENTRY (see longtitudinal plan of care for additional care plan information)  Current Barriers:  . Diabetes: controlled; most recent A1c 7% after restarting insulin pump therapy o APPROVED for Novolog patient assistance through 05/21/20. Finally arrived to Dr. Joycie Peek office; he has picked it up o F/u w/ Dr. Gabriel Carina scheduled 08/19/19 . Current antihyperglycemic regimen: Novolog via pump o Receiving Novolog vial assistance through [name] through 05/21/20 . Cardiovascular risk reduction: o Current hypertensive regimen: amlodipine 10 mg daily, carvedilol 25 mg BID, losartan 100 mg daily, HCTZ 25 mg daily; not checking BP at home, was recommended to do so per Dr. Wynetta Emery at last visit  o Current hyperlipidemia regimen: atorvastatin 20 mg daily; LDL at goal <70 on last check  o Current antiplatelet regimen: ASA 81 mg daily  . Gout: allopurinol 300 mg daily; uric acid at goal <6. No recent gout flares   Pharmacist Clinical Goal(s):  Marland Kitchen Over the next 90 days, patient with work with PharmD and primary care provider to address maintained glycemic control  Interventions: . Comprehensive medication review performed w/ fill history, medication list updated in electronic medical record . Reviewed Novo Nordisk refill procedure. Patient to contact Dr. Joycie Peek office to fax order into [name].  . Reviewed recommendation from PCP to check BP twice weekly. Counseled to investigate OTC benefits from  [employer], and choose arm cuff over wrist cuff. Discussed goal SBP <140, ideally <130. Patient verbalizes understanding . If further BP reduction needed moving forward, consider switching HCTZ to chlorthalidone for greater potency, and also reduced risk of exacerbating gout.   Patient Self Care Activities:  . Patient will check blood glucose regularly, document, and provide at future appointments . Patient  will take medications as prescribed . Patient will report any questions or concerns to provider   Please see past updates related to this goal by clicking on the "Past Updates" button in the selected goal          Plan:  - Scheduled f/u call 11/11/19  [name], PharmD, [place] [phone]

## 2019-08-19 DIAGNOSIS — E113293 Type 2 diabetes mellitus with mild nonproliferative diabetic retinopathy without macular edema, bilateral: Secondary | ICD-10-CM | POA: Diagnosis not present

## 2019-08-19 DIAGNOSIS — Z794 Long term (current) use of insulin: Secondary | ICD-10-CM | POA: Diagnosis not present

## 2019-08-19 LAB — HEMOGLOBIN A1C: Hemoglobin A1C: 6.9

## 2019-08-22 DIAGNOSIS — E119 Type 2 diabetes mellitus without complications: Secondary | ICD-10-CM | POA: Diagnosis not present

## 2019-08-26 DIAGNOSIS — E669 Obesity, unspecified: Secondary | ICD-10-CM | POA: Diagnosis not present

## 2019-08-26 DIAGNOSIS — E1169 Type 2 diabetes mellitus with other specified complication: Secondary | ICD-10-CM | POA: Diagnosis not present

## 2019-08-26 DIAGNOSIS — E113293 Type 2 diabetes mellitus with mild nonproliferative diabetic retinopathy without macular edema, bilateral: Secondary | ICD-10-CM | POA: Diagnosis not present

## 2019-08-26 DIAGNOSIS — Z794 Long term (current) use of insulin: Secondary | ICD-10-CM | POA: Diagnosis not present

## 2019-08-26 DIAGNOSIS — Z9641 Presence of insulin pump (external) (internal): Secondary | ICD-10-CM | POA: Diagnosis not present

## 2019-09-02 DIAGNOSIS — M542 Cervicalgia: Secondary | ICD-10-CM | POA: Diagnosis not present

## 2019-09-03 ENCOUNTER — Other Ambulatory Visit: Payer: Self-pay | Admitting: Student

## 2019-09-03 DIAGNOSIS — M542 Cervicalgia: Secondary | ICD-10-CM

## 2019-09-08 ENCOUNTER — Other Ambulatory Visit: Payer: Self-pay

## 2019-09-08 ENCOUNTER — Ambulatory Visit (INDEPENDENT_AMBULATORY_CARE_PROVIDER_SITE_OTHER): Payer: Medicare HMO | Admitting: Nurse Practitioner

## 2019-09-08 ENCOUNTER — Encounter: Payer: Self-pay | Admitting: Nurse Practitioner

## 2019-09-08 VITALS — BP 146/77 | HR 79 | Temp 97.8°F | Wt 330.1 lb

## 2019-09-08 DIAGNOSIS — R3121 Asymptomatic microscopic hematuria: Secondary | ICD-10-CM | POA: Insufficient documentation

## 2019-09-08 DIAGNOSIS — R319 Hematuria, unspecified: Secondary | ICD-10-CM | POA: Diagnosis not present

## 2019-09-08 LAB — UA/M W/RFLX CULTURE, ROUTINE

## 2019-09-08 MED ORDER — AMOXICILLIN-POT CLAVULANATE 875-125 MG PO TABS: 1.0000 | ORAL_TABLET | Freq: Two times a day (BID) | ORAL | 0 refills | Status: AC

## 2019-09-08 NOTE — Assessment & Plan Note (Signed)
Acute, asymptomatic, with UA being difficult read per lab staff -- large clot present and multiple RBC's.  Will send for culture. At this time will send in script for Augmentin x 7 days, avoid Bactrim and Macrobid due to age >67 and use of Losartan.  Change abx regimen as needed based on culture findings.  Obtain CT abd/pelvis for further assessment due to gross hematuria with no other symptoms.  Referral to return back to urology for further assessment placed, would benefit from their feedback due to patient past history.  Obtain CBC and BMP today.  Return in 4 days for follow-up.

## 2019-09-08 NOTE — Patient Instructions (Signed)
Hematuria, Adult Hematuria is blood in the urine. Blood may be visible in the urine, or it may be identified with a test. This condition can be caused by infections of the bladder, urethra, kidney, or prostate. Other possible causes include:  Kidney stones.  Cancer of the urinary tract.  Too much calcium in the urine.  Conditions that are passed from parent to child (inherited conditions).  Exercise that requires a lot of energy. Infections can usually be treated with medicine, and a kidney stone usually will pass through your urine. If neither of these is the cause of your hematuria, more tests may be needed to identify the cause of your symptoms. It is very important to tell your health care provider about any blood in your urine, even if it is painless or the blood stops without treatment. Blood in the urine, when it happens and then stops and then happens again, can be a symptom of a very serious condition, including cancer. There is no pain in the initial stages of many urinary cancers. Follow these instructions at home: Medicines  Take over-the-counter and prescription medicines only as told by your health care provider.  If you were prescribed an antibiotic medicine, take it as told by your health care provider. Do not stop taking the antibiotic even if you start to feel better. Eating and drinking  Drink enough fluid to keep your urine clear or pale yellow. It is recommended that you drink 3-4 quarts (2.8-3.8 L) a day. If you have been diagnosed with an infection, it is recommended that you drink cranberry juice in addition to large amounts of water.  Avoid caffeine, tea, and carbonated beverages. These tend to irritate the bladder.  Avoid alcohol because it may irritate the prostate (men). General instructions  If you have been diagnosed with a kidney stone, follow your health care provider's instructions about straining your urine to catch the stone.  Empty your bladder  often. Avoid holding urine for long periods of time.  If you are male: ? After a bowel movement, wipe from front to back and use each piece of toilet paper only once. ? Empty your bladder before and after sex.  Pay attention to any changes in your symptoms. Tell your health care provider about any changes or any new symptoms.  It is your responsibility to get your test results. Ask your health care provider, or the department performing the test, when your results will be ready.  Keep all follow-up visits as told by your health care provider. This is important. Contact a health care provider if:  You develop back pain.  You have a fever.  You have nausea or vomiting.  Your symptoms do not improve after 3 days.  Your symptoms get worse. Get help right away if:  You develop severe vomiting and are unable take medicine without vomiting.  You develop severe pain in your back or abdomen even though you are taking medicine.  You pass a large amount of blood in your urine.  You pass blood clots in your urine.  You feel very weak or like you might faint.  You faint. Summary  Hematuria is blood in the urine. It has many possible causes.  It is very important that you tell your health care provider about any blood in your urine, even if it is painless or the blood stops without treatment.  Take over-the-counter and prescription medicines only as told by your health care provider.  Drink enough fluid to keep   your urine clear or pale yellow. This information is not intended to replace advice given to you by your health care provider. Make sure you discuss any questions you have with your health care provider. Document Revised: 10/02/2018 Document Reviewed: 06/10/2016 Elsevier Patient Education  2020 [employer]  

## 2019-09-08 NOTE — Progress Notes (Signed)
BP (!) 146/77 (BP Location: Left Arm, Patient Position: Sitting, Cuff Size: Large)   Pulse 79   Temp 97.8 F (36.6 C) (Oral)   Wt (!) 330 lb 2 oz (149.7 kg)   SpO2 96%   BMI 42.39 kg/m    Subjective:    Patient ID: Aaron [name], male    DOB: [birth year], 69 y.o.   MRN: [id number]  HPI: Aaron [name] is a 69 y.o. male  Chief Complaint  Patient presents with  . Hematuria    patient states beginning of urine stream has clots-at the end of the stream is just red with blood then the color lightens   URINARY SYMPTOMS Reports new onset of hematuria which started Friday night.  He thought about going to ER and decided to wait until Monday.  When first urinates clot comes out, then it is red, and then at end of stream it begins clearing up.  He reports similar issues in past in August 2019, was given abx and cleared up he reports.  Was seen Dr. Elenor Quinones at the time with urology.  Has history of prostate CA, had prostate removed in 2013.    He takes no current ASA or anticoagulant. Dysuria: no Urinary frequency: no Urgency: baseline for him Small volume voids: no Symptom severity: no Urinary incontinence: no Foul odor: no Hematuria: yes Abdominal pain: no Back pain: no Suprapubic pain/pressure: no Flank pain: no Fever:  no Vomiting: no Status: stable Previous urinary tract infection: 2019 Recurrent urinary tract infection: no Sexual activity: not sexually active History of sexually transmitted disease: no Treatments attempted: increasing fluids   Relevant past medical, surgical, family and social history reviewed and updated as indicated. Interim medical history since our last visit reviewed. Allergies and medications reviewed and updated.  Review of Systems  Constitutional: Negative for activity change, diaphoresis, fatigue and fever.  Respiratory: Negative for cough, chest tightness, shortness of breath and wheezing.   Cardiovascular: Negative for chest pain,  palpitations and leg swelling.  Gastrointestinal: Negative.   Endocrine: Negative for cold intolerance, heat intolerance, polydipsia, polyphagia and polyuria.  Genitourinary: Positive for hematuria. Negative for decreased urine volume, discharge, dysuria, frequency, penile swelling and urgency.  Neurological: Negative.   Psychiatric/Behavioral: Negative.     Per HPI unless specifically indicated above     Objective:    BP (!) 146/77 (BP Location: Left Arm, Patient Position: Sitting, Cuff Size: Large)   Pulse 79   Temp 97.8 F (36.6 C) (Oral)   Wt (!) 330 lb 2 oz (149.7 kg)   SpO2 96%   BMI 42.39 kg/m   Wt Readings from Last 3 Encounters:  09/08/19 (!) 330 lb 2 oz (149.7 kg)  08/19/18 (!) 315 lb (142.9 kg)  03/21/18 (!) 317 lb (143.8 kg)    Physical Exam Vitals and nursing note reviewed.  Constitutional:      General: He is awake. He is not in acute distress.    Appearance: He is well-developed and well-groomed. He is obese. He is not ill-appearing.  HENT:     Head: Normocephalic and atraumatic.     Right Ear: Hearing normal. No drainage.     Left Ear: Hearing normal. No drainage.  Eyes:     General: Lids are normal.        Right eye: No discharge.        Left eye: No discharge.     Conjunctiva/sclera: Conjunctivae normal.     Pupils: Pupils are equal, round, and  reactive to light.  Neck:     Vascular: No carotid bruit.  Cardiovascular:     Rate and Rhythm: Normal rate and regular rhythm.     Heart sounds: Normal heart sounds, S1 normal and S2 normal. No murmur. No gallop.   Pulmonary:     Effort: Pulmonary effort is normal. No accessory muscle usage or respiratory distress.     Breath sounds: Normal breath sounds.  Abdominal:     General: Bowel sounds are normal. There is no distension.     Palpations: Abdomen is soft. There is no hepatomegaly or splenomegaly.     Tenderness: There is no abdominal tenderness. There is no right CVA tenderness or left CVA tenderness.   Musculoskeletal:        General: Normal range of motion.     Cervical back: Normal range of motion and neck supple.     Right lower leg: No edema.     Left lower leg: No edema.  Skin:    General: Skin is warm and dry.     Capillary Refill: Capillary refill takes less than 2 seconds.  Neurological:     Mental Status: He is alert and oriented to person, place, and time.  Psychiatric:        Attention and Perception: Attention normal.        Mood and Affect: Mood normal.        Speech: Speech normal.        Behavior: Behavior normal. Behavior is cooperative.        Thought Content: Thought content normal.     Results for orders placed or performed in visit on 09/08/19  Microscopic Examination   URINE  Result Value Ref Range   WBC, UA 0-5 0 - 5 /hpf   RBC >30 (A) 0 - 2 /hpf   Epithelial Cells (non renal) 0-10 0 - 10 /hpf   Bacteria, UA None seen None seen/Few  Urine Culture, Reflex   URINE  Result Value Ref Range   Urine Culture, Routine WILL FOLLOW   UA/M w/rflx Culture, Routine   Specimen: Urine   URINE  Result Value Ref Range   Specific Gravity, UA CANCELED    pH, UA CANCELED    Color, UA Red (A) Yellow   Appearance Ur Cloudy (A) Clear   Protein,UA CANCELED    Glucose, UA CANCELED    Ketones, UA CANCELED    Microscopic Examination See below:    Urinalysis Reflex Comment       Assessment & Plan:   Problem List Items Addressed This Visit      Genitourinary   Asymptomatic microscopic hematuria - Primary    Acute, asymptomatic, with UA being difficult read per lab staff -- large clot present and multiple RBC's.  Will send for culture. At this time will send in script for Augmentin x 7 days, avoid Bactrim and Macrobid due to age >78 and use of Losartan.  Change abx regimen as needed based on culture findings.  Obtain CT abd/pelvis for further assessment due to gross hematuria with no other symptoms.  Referral to return back to urology for further assessment placed,  would benefit from their feedback due to patient past history.  Obtain CBC and BMP today.  Return in 4 days for follow-up.      Relevant Orders   UA/M w/rflx Culture, Routine (Completed)   CT HEMATURIA WORKUP   CBC with Differential/Platelet   Basic metabolic panel   Ambulatory referral to Urology  Follow up plan: Return in about 4 days (around 09/12/2019) for Hematuria.

## 2019-09-09 LAB — CBC WITH DIFFERENTIAL/PLATELET
Basophils Absolute: 0.1 10*3/uL (ref 0.0–0.2)
Basos: 1 %
EOS (ABSOLUTE): 0.1 10*3/uL (ref 0.0–0.4)
Eos: 2 %
Hematocrit: 40.8 % (ref 37.5–51.0)
Hemoglobin: 13.5 g/dL (ref 13.0–17.7)
Immature Grans (Abs): 0 10*3/uL (ref 0.0–0.1)
Immature Granulocytes: 0 %
Lymphocytes Absolute: 2 10*3/uL (ref 0.7–3.1)
Lymphs: 21 %
MCH: 27.2 pg (ref 26.6–33.0)
MCHC: 33.1 g/dL (ref 31.5–35.7)
MCV: 82 fL (ref 79–97)
Monocytes Absolute: 0.9 10*3/uL (ref 0.1–0.9)
Monocytes: 10 %
Neutrophils Absolute: 6.4 10*3/uL (ref 1.4–7.0)
Neutrophils: 66 %
Platelets: 285 10*3/uL (ref 150–450)
RBC: 4.97 x10E6/uL (ref 4.14–5.80)
RDW: 14.7 % (ref 11.6–15.4)
WBC: 9.5 10*3/uL (ref 3.4–10.8)

## 2019-09-09 LAB — BASIC METABOLIC PANEL
BUN/Creatinine Ratio: 15 (ref 10–24)
BUN: 19 mg/dL (ref 8–27)
CO2: 26 mmol/L (ref 20–29)
Calcium: 9.1 mg/dL (ref 8.6–10.2)
Chloride: 101 mmol/L (ref 96–106)
Creatinine, Ser: 1.3 mg/dL — ABNORMAL HIGH (ref 0.76–1.27)
GFR calc Af Amer: 65 mL/min/{1.73_m2} (ref >59)
GFR calc non Af Amer: 56 mL/min/{1.73_m2} — ABNORMAL LOW (ref >59)
Glucose: 149 mg/dL — ABNORMAL HIGH (ref 65–99)
Potassium: 4.3 mmol/L (ref 3.5–5.2)
Sodium: 144 mmol/L (ref 134–144)

## 2019-09-09 NOTE — Progress Notes (Signed)
Contacted via MyChart

## 2019-09-10 DIAGNOSIS — H524 Presbyopia: Secondary | ICD-10-CM | POA: Diagnosis not present

## 2019-09-10 DIAGNOSIS — E113293 Type 2 diabetes mellitus with mild nonproliferative diabetic retinopathy without macular edema, bilateral: Secondary | ICD-10-CM | POA: Diagnosis not present

## 2019-09-10 DIAGNOSIS — H52223 Regular astigmatism, bilateral: Secondary | ICD-10-CM | POA: Diagnosis not present

## 2019-09-10 DIAGNOSIS — H40013 Open angle with borderline findings, low risk, bilateral: Secondary | ICD-10-CM | POA: Diagnosis not present

## 2019-09-10 DIAGNOSIS — H5203 Hypermetropia, bilateral: Secondary | ICD-10-CM | POA: Diagnosis not present

## 2019-09-10 DIAGNOSIS — H2513 Age-related nuclear cataract, bilateral: Secondary | ICD-10-CM | POA: Diagnosis not present

## 2019-09-10 LAB — URINE CULTURE, REFLEX: Organism ID, Bacteria: NO GROWTH

## 2019-09-10 LAB — MICROSCOPIC EXAMINATION
Bacteria, UA: NONE SEEN
RBC, Urine: >30 /hpf — AB (ref 0–2)

## 2019-09-10 LAB — UA/M W/RFLX CULTURE, ROUTINE

## 2019-09-10 LAB — HM DIABETES EYE EXAM

## 2019-09-10 NOTE — Progress Notes (Signed)
Contacted via Aaron [name], please call to ensure he received this message: Good afternoon Mr. Cucco.  Your urine test returned showing no growth or infection.  You can stop antibiotic and please ensure you have attend follow-up with urology, this is very important as it appears no infection present but you did have significant blood.  We definitely need to delve into this more with urology.  Thank you!!  If any questions let me know.  Kindest regards, Ryley Teater

## 2019-09-11 ENCOUNTER — Ambulatory Visit: Payer: Medicare HMO | Admitting: Physician Assistant

## 2019-09-12 ENCOUNTER — Other Ambulatory Visit: Payer: Self-pay

## 2019-09-12 ENCOUNTER — Ambulatory Visit (INDEPENDENT_AMBULATORY_CARE_PROVIDER_SITE_OTHER): Payer: Medicare HMO | Admitting: Nurse Practitioner

## 2019-09-12 ENCOUNTER — Encounter: Payer: Self-pay | Admitting: Nurse Practitioner

## 2019-09-12 VITALS — BP 132/76 | HR 65 | Temp 98.0°F

## 2019-09-12 DIAGNOSIS — R3121 Asymptomatic microscopic hematuria: Secondary | ICD-10-CM

## 2019-09-12 LAB — UA/M W/RFLX CULTURE, ROUTINE
Bilirubin, UA: NEGATIVE
Glucose, UA: NEGATIVE
Ketones, UA: NEGATIVE
Leukocytes,UA: NEGATIVE
Nitrite, UA: NEGATIVE
Protein,UA: NEGATIVE
Specific Gravity, UA: 1.015 (ref 1.005–1.030)
Urobilinogen, Ur: 0.2 mg/dL (ref 0.2–1.0)
pH, UA: 5.5 (ref 5.0–7.5)

## 2019-09-12 LAB — MICROSCOPIC EXAMINATION
Bacteria, UA: NONE SEEN
WBC, UA: NONE SEEN /hpf (ref 0–5)

## 2019-09-12 NOTE — Assessment & Plan Note (Signed)
Acute and improving, previous UA on Monday with large amount of blood and today UA shows trace.  He reports no further blood being noted in urine.  UA Monday returned with no growth and we discontinued abx treatment.  He has not followed up with urology as recommended, but agrees with PCP and plans to follow-up next month.  Will look into CT scan as he has not heard from them and would like it performed same day as upcoming MRI, which is Tuesday.  He is to follow-up as scheduled in June, sooner if any worsening or return of symptoms.

## 2019-09-12 NOTE — Patient Instructions (Signed)
Hematuria, Adult Hematuria is blood in the urine. Blood may be visible in the urine, or it may be identified with a test. This condition can be caused by infections of the bladder, urethra, kidney, or prostate. Other possible causes include:  Kidney stones.  Cancer of the urinary tract.  Too much calcium in the urine.  Conditions that are passed from parent to child (inherited conditions).  Exercise that requires a lot of energy. Infections can usually be treated with medicine, and a kidney stone usually will pass through your urine. If neither of these is the cause of your hematuria, more tests may be needed to identify the cause of your symptoms. It is very important to tell your health care provider about any blood in your urine, even if it is painless or the blood stops without treatment. Blood in the urine, when it happens and then stops and then happens again, can be a symptom of a very serious condition, including cancer. There is no pain in the initial stages of many urinary cancers. Follow these instructions at home: Medicines  Take over-the-counter and prescription medicines only as told by your health care provider.  If you were prescribed an antibiotic medicine, take it as told by your health care provider. Do not stop taking the antibiotic even if you start to feel better. Eating and drinking  Drink enough fluid to keep your urine clear or pale yellow. It is recommended that you drink 3-4 quarts (2.8-3.8 L) a day. If you have been diagnosed with an infection, it is recommended that you drink cranberry juice in addition to large amounts of water.  Avoid caffeine, tea, and carbonated beverages. These tend to irritate the bladder.  Avoid alcohol because it may irritate the prostate (men). General instructions  If you have been diagnosed with a kidney stone, follow your health care provider's instructions about straining your urine to catch the stone.  Empty your bladder  often. Avoid holding urine for long periods of time.  If you are male: ? After a bowel movement, wipe from front to back and use each piece of toilet paper only once. ? Empty your bladder before and after sex.  Pay attention to any changes in your symptoms. Tell your health care provider about any changes or any new symptoms.  It is your responsibility to get your test results. Ask your health care provider, or the department performing the test, when your results will be ready.  Keep all follow-up visits as told by your health care provider. This is important. Contact a health care provider if:  You develop back pain.  You have a fever.  You have nausea or vomiting.  Your symptoms do not improve after 3 days.  Your symptoms get worse. Get help right away if:  You develop severe vomiting and are unable take medicine without vomiting.  You develop severe pain in your back or abdomen even though you are taking medicine.  You pass a large amount of blood in your urine.  You pass blood clots in your urine.  You feel very weak or like you might faint.  You faint. Summary  Hematuria is blood in the urine. It has many possible causes.  It is very important that you tell your health care provider about any blood in your urine, even if it is painless or the blood stops without treatment.  Take over-the-counter and prescription medicines only as told by your health care provider.  Drink enough fluid to keep   your urine clear or pale yellow. This information is not intended to replace advice given to you by your health care provider. Make sure you discuss any questions you have with your health care provider. Document Revised: 10/02/2018 Document Reviewed: 06/10/2016 Elsevier Patient Education  2020 [employer]  

## 2019-09-12 NOTE — Progress Notes (Signed)
BP 132/76 (BP Location: Left Arm)   Pulse 65   Temp 98 F (36.7 C) (Oral)   SpO2 98%    Subjective:    Patient ID: Aaron [name], male    DOB: 1950-12-04, 68 y.o.   MRN: [id number]  HPI: Aaron [name] is a 69 y.o. male  Chief Complaint  Patient presents with  . Hematuria    4 day f/up    URINARY SYMPTOMS Follow-up from visit on 09/08/19.  Hematuria which started Friday last week.  Ordered abx, referral back to urology and CT scan last visit.  At this time he reports it has cleared all the way up.  He reports similar issues in past in August 2019, was given abx and cleared up he reports.  Was seen Dr. Elenor Quinones at the time with urology.  Has history of prostate CA, had prostate removed in 2013.  He repost he feels he does not need to go to urology and has not scheduled appointment.  Discussed at length with him there is still trace blood in urine, he agrees to see him later this month.    He takes no current ASA or anticoagulant.  Non smoker. Dysuria: no Urinary frequency: no Urgency: baseline for him Small volume voids: no Symptom severity: no Urinary incontinence: no Foul odor: no Hematuria: yes Abdominal pain: no Back pain: no Suprapubic pain/pressure: no Flank pain: no Fever:  no Vomiting: no Status: stable Previous urinary tract infection: 2019 Recurrent urinary tract infection: no Sexual activity: not sexually active History of sexually transmitted disease: no Treatments attempted: increasing fluids    Relevant past medical, surgical, family and social history reviewed and updated as indicated. Interim medical history since our last visit reviewed. Allergies and medications reviewed and updated.  Review of Systems  Constitutional: Negative for activity change, diaphoresis, fatigue and fever.  Respiratory: Negative for cough, chest tightness, shortness of breath and wheezing.   Cardiovascular: Negative for chest pain, palpitations and leg swelling.   Gastrointestinal: Negative.   Endocrine: Negative for cold intolerance, heat intolerance, polydipsia, polyphagia and polyuria.  Genitourinary: Negative for decreased urine volume, discharge, dysuria, frequency, hematuria, penile swelling and urgency.  Neurological: Negative.   Psychiatric/Behavioral: Negative.     Per HPI unless specifically indicated above     Objective:    BP 132/76 (BP Location: Left Arm)   Pulse 65   Temp 98 F (36.7 C) (Oral)   SpO2 98%   Wt Readings from Last 3 Encounters:  09/08/19 (!) 330 lb 2 oz (149.7 kg)  08/19/18 (!) 315 lb (142.9 kg)  03/21/18 (!) 317 lb (143.8 kg)    Physical Exam Vitals and nursing note reviewed.  Constitutional:      General: He is awake. He is not in acute distress.    Appearance: He is well-developed and well-groomed. He is obese. He is not ill-appearing.  HENT:     Head: Normocephalic and atraumatic.     Right Ear: Hearing normal. No drainage.     Left Ear: Hearing normal. No drainage.  Eyes:     General: Lids are normal.        Right eye: No discharge.        Left eye: No discharge.     Conjunctiva/sclera: Conjunctivae normal.     Pupils: Pupils are equal, round, and reactive to light.  Neck:     Vascular: No carotid bruit.  Cardiovascular:     Rate and Rhythm: Normal rate and regular rhythm.  Heart sounds: Normal heart sounds, S1 normal and S2 normal. No murmur. No gallop.   Pulmonary:     Effort: Pulmonary effort is normal. No accessory muscle usage or respiratory distress.     Breath sounds: Normal breath sounds.  Abdominal:     General: Bowel sounds are normal. There is no distension.     Palpations: Abdomen is soft. There is no hepatomegaly or splenomegaly.     Tenderness: There is no abdominal tenderness. There is no right CVA tenderness or left CVA tenderness.  Musculoskeletal:        General: Normal range of motion.     Cervical back: Normal range of motion and neck supple.     Right lower leg: No  edema.     Left lower leg: No edema.  Skin:    General: Skin is warm and dry.     Capillary Refill: Capillary refill takes less than 2 seconds.  Neurological:     Mental Status: He is alert and oriented to person, place, and time.  Psychiatric:        Attention and Perception: Attention normal.        Mood and Affect: Mood normal.        Speech: Speech normal.        Behavior: Behavior normal. Behavior is cooperative.        Thought Content: Thought content normal.    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  He reports no further blood being noted in urine.  UA Monday returned with no growth and we discontinued abx treatment.  He has not followed up with urology as recommended, but agrees with PCP and plans to follow-up next month.  Will look into CT scan as he has not heard from them and would like it performed same day as upcoming MRI, which is Tuesday.  He is to follow-up as scheduled in June, sooner if any worsening or return of symptoms.      Relevant Orders   UA/M w/rflx Culture, Routine       Follow up plan: Return for as scheduled in June.

## 2019-09-15 DIAGNOSIS — D485 Neoplasm of uncertain behavior of skin: Secondary | ICD-10-CM | POA: Diagnosis not present

## 2019-09-15 DIAGNOSIS — L820 Inflamed seborrheic keratosis: Secondary | ICD-10-CM | POA: Diagnosis not present

## 2019-09-16 ENCOUNTER — Ambulatory Visit
Admission: RE | Admit: 2019-09-16 | Discharge: 2019-09-16 | Disposition: A | Discharge status: Home / Self Care | Payer: Medicare HMO | Source: Ambulatory Visit | Attending: Student | Admitting: Student

## 2019-09-16 ENCOUNTER — Other Ambulatory Visit: Payer: Self-pay

## 2019-09-16 ENCOUNTER — Ambulatory Visit
Admission: RE | Admit: 2019-09-16 | Discharge: 2019-09-16 | Disposition: A | Discharge status: Home / Self Care | Payer: Medicare HMO | Source: Ambulatory Visit | Attending: Nurse Practitioner | Admitting: Nurse Practitioner

## 2019-09-16 DIAGNOSIS — R3121 Asymptomatic microscopic hematuria: Secondary | ICD-10-CM | POA: Diagnosis not present

## 2019-09-16 DIAGNOSIS — M542 Cervicalgia: Secondary | ICD-10-CM | POA: Diagnosis not present

## 2019-09-16 DIAGNOSIS — K8020 Calculus of gallbladder without cholecystitis without obstruction: Secondary | ICD-10-CM | POA: Diagnosis not present

## 2019-09-16 DIAGNOSIS — N200 Calculus of kidney: Secondary | ICD-10-CM | POA: Diagnosis not present

## 2019-09-16 LAB — POCT I-STAT CREATININE: Creatinine, Ser: 1.6 mg/dL — ABNORMAL HIGH (ref 0.61–1.24)

## 2019-09-16 MED ORDER — IOHEXOL 300 MG/ML  SOLN
125.0000 mL | Freq: Once | INTRAMUSCULAR | Status: AC | PRN
  Administered 2019-09-16: 125 mL via INTRAVENOUS

## 2019-09-17 NOTE — Progress Notes (Signed)
Contacted via MyChart Good morning Aaron [name], your imaging has returned.  It did show some kidney stones that were not causing obstruction, most likely cause of your recent bleeding, but I do recommend follow-up with urology as we discussed.  You also have some gallstones, but they are also not causing issue at this time, we will monitor.  Overall, similar to previous imaging.  Any questions? Keep being awesome!! Kindest regards, Azeneth Carbonell

## 2019-10-06 ENCOUNTER — Other Ambulatory Visit: Payer: Self-pay | Admitting: Nurse Practitioner

## 2019-10-07 DIAGNOSIS — M542 Cervicalgia: Secondary | ICD-10-CM | POA: Diagnosis not present

## 2019-10-07 DIAGNOSIS — M4802 Spinal stenosis, cervical region: Secondary | ICD-10-CM | POA: Diagnosis not present

## 2019-10-09 ENCOUNTER — Ambulatory Visit: Payer: Medicare HMO

## 2019-10-15 ENCOUNTER — Ambulatory Visit (INDEPENDENT_AMBULATORY_CARE_PROVIDER_SITE_OTHER): Payer: Medicare HMO

## 2019-10-15 DIAGNOSIS — Z0000 Encounter for general adult medical examination without abnormal findings: Secondary | ICD-10-CM

## 2019-10-15 NOTE — Patient Instructions (Signed)
[name] , Thank you for taking time to come for your Medicare Wellness Visit. I appreciate your ongoing commitment to your health goals. Please review the following plan we discussed and let me know if I can assist you in the future.   Screening recommendations/referrals: Colonoscopy: completed 2012, due 2022 Recommended yearly ophthalmology/optometry visit for glaucoma screening and checkup Recommended yearly dental visit for hygiene and checkup  Vaccinations: Influenza vaccine: up to date, due 01/2020 Pneumococcal vaccine: completed series  Tdap vaccine: up to date  Shingles vaccine: shingrix eligible    Covid-19: completed series   Advanced directives: Advance directive discussed with you today.  Once this is complete please bring a copy in to our office so we can scan it into your chart.  Conditions/risks identified: diabetic  Next appointment: Follow up in one year for your annual wellness visit.   Preventive Care 4 Years and Older, Male Preventive care refers to lifestyle choices and visits with your health care provider that can promote health and wellness. What does preventive care include?  A yearly physical exam. This is also called an annual well check.  Dental exams once or twice a year.  Routine eye exams. Ask your health care provider how often you should have your eyes checked.  Personal lifestyle choices, including:  Daily care of your teeth and gums.  Regular physical activity.  Eating a healthy diet.  Avoiding tobacco and drug use.  Limiting alcohol use.  Practicing safe sex.  Taking low doses of aspirin every day.  Taking vitamin and mineral supplements as recommended by your health care provider. What happens during an annual well check? The services and screenings done by your health care provider during your annual well check will depend on your age, overall health, lifestyle risk factors, and family history of disease. Counseling  Your  health care provider may ask you questions about your:  Alcohol use.  Tobacco use.  Drug use.  Emotional well-being.  Home and relationship well-being.  Sexual activity.  Eating habits.  History of falls.  Memory and ability to understand (cognition).  Work and work Statistician. Screening  You may have the following tests or measurements:  Height, weight, and BMI.  Blood pressure.  Lipid and cholesterol levels. These may be checked every 5 years, or more frequently if you are over 49 years old.  Skin check.  Lung cancer screening. You may have this screening every year starting at age 21 if you have a 30-pack-year history of smoking and currently smoke or have quit within the past 15 years.  Fecal occult blood test (FOBT) of the stool. You may have this test every year starting at age 16.  Flexible sigmoidoscopy or colonoscopy. You may have a sigmoidoscopy every 5 years or a colonoscopy every 10 years starting at age 25.  Prostate cancer screening. Recommendations will vary depending on your family history and other risks.  Hepatitis C blood test.  Hepatitis B blood test.  Sexually transmitted disease (STD) testing.  Diabetes screening. This is done by checking your blood sugar (glucose) after you have not eaten for a while (fasting). You may have this done every 1-3 years.  Abdominal aortic aneurysm (AAA) screening. You may need this if you are a current or former smoker.  Osteoporosis. You may be screened starting at age 2 if you are at high risk. Talk with your health care provider about your test results, treatment options, and if necessary, the need for more tests. Vaccines  Your health care provider may recommend certain vaccines, such as:  Influenza vaccine. This is recommended every year.  Tetanus, diphtheria, and acellular pertussis (Tdap, Td) vaccine. You may need a Td booster every 10 years.  Zoster vaccine. You may need this after age  74.  Pneumococcal 13-valent conjugate (PCV13) vaccine. One dose is recommended after age 19.  Pneumococcal polysaccharide (PPSV23) vaccine. One dose is recommended after age 2. Talk to your health care provider about which screenings and vaccines you need and how often you need them. This information is not intended to replace advice given to you by your health care provider. Make sure you discuss any questions you have with your health care provider. Document Released: 06/04/2015 Document Revised: 01/26/2016 Document Reviewed: 03/09/2015 Elsevier Interactive Patient Education  2017 [employer] Prevention in the Home Falls can cause injuries. They can happen to people of all ages. There are many things you can do to make your home safe and to help prevent falls. What can I do on the outside of my home?  Regularly fix the edges of walkways and driveways and fix any cracks.  Remove anything that might make you trip as you walk through a door, such as a raised step or threshold.  Trim any bushes or trees on the path to your home.  Use bright outdoor lighting.  Clear any walking paths of anything that might make someone trip, such as rocks or tools.  Regularly check to see if handrails are loose or broken. Make sure that both sides of any steps have handrails.  Any raised decks and porches should have guardrails on the edges.  Have any leaves, snow, or ice cleared regularly.  Use sand or salt on walking paths during winter.  Clean up any spills in your garage right away. This includes oil or grease spills. What can I do in the bathroom?  Use night lights.  Install grab bars by the toilet and in the tub and shower. Do not use towel bars as grab bars.  Use non-skid mats or decals in the tub or shower.  If you need to sit down in the shower, use a plastic, non-slip stool.  Keep the floor dry. Clean up any water that spills on the floor as soon as it happens.  Remove  soap buildup in the tub or shower regularly.  Attach bath mats securely with double-sided non-slip rug tape.  Do not have throw rugs and other things on the floor that can make you trip. What can I do in the bedroom?  Use night lights.  Make sure that you have a light by your bed that is easy to reach.  Do not use any sheets or blankets that are too big for your bed. They should not hang down onto the floor.  Have a firm chair that has side arms. You can use this for support while you get dressed.  Do not have throw rugs and other things on the floor that can make you trip. What can I do in the kitchen?  Clean up any spills right away.  Avoid walking on wet floors.  Keep items that you use a lot in easy-to-reach places.  If you need to reach something above you, use a strong step stool that has a grab bar.  Keep electrical cords out of the way.  Do not use floor polish or wax that makes floors slippery. If you must use wax, use non-skid floor wax.  Do  not have throw rugs and other things on the floor that can make you trip. What can I do with my stairs?  Do not leave any items on the stairs.  Make sure that there are handrails on both sides of the stairs and use them. Fix handrails that are broken or loose. Make sure that handrails are as long as the stairways.  Check any carpeting to make sure that it is firmly attached to the stairs. Fix any carpet that is loose or worn.  Avoid having throw rugs at the top or bottom of the stairs. If you do have throw rugs, attach them to the floor with carpet tape.  Make sure that you have a light switch at the top of the stairs and the bottom of the stairs. If you do not have them, ask someone to add them for you. What else can I do to help prevent falls?  Wear shoes that:  Do not have high heels.  Have rubber bottoms.  Are comfortable and fit you well.  Are closed at the toe. Do not wear sandals.  If you use a  stepladder:  Make sure that it is fully opened. Do not climb a closed stepladder.  Make sure that both sides of the stepladder are locked into place.  Ask someone to hold it for you, if possible.  Clearly mark and make sure that you can see:  Any grab bars or handrails.  First and last steps.  Where the edge of each step is.  Use tools that help you move around (mobility aids) if they are needed. These include:  Canes.  Walkers.  Scooters.  Crutches.  Turn on the lights when you go into a dark area. Replace any light bulbs as soon as they burn out.  Set up your furniture so you have a clear path. Avoid moving your furniture around.  If any of your floors are uneven, fix them.  If there are any pets around you, be aware of where they are.  Review your medicines with your doctor. Some medicines can make you feel dizzy. This can increase your chance of falling. Ask your doctor what other things that you can do to help prevent falls. This information is not intended to replace advice given to you by your health care provider. Make sure you discuss any questions you have with your health care provider. Document Released: 03/04/2009 Document Revised: 10/14/2015 Document Reviewed: 06/12/2014 Elsevier Interactive Patient Education  2017 [employer].

## 2019-10-15 NOTE — Progress Notes (Signed)
Subjective:   Aaron [name] is a 69 y.o. male who presents for Medicare Annual/Subsequent preventive examination.  I connected with [name] today by telephone and verified that I am speaking with the correct person using two identifiers. Location patient: home Location provider: work Persons participating in the virtual visit: patient, provider.   I discussed the limitations, risks, security and privacy concerns of performing an evaluation and management service by telephone and the availability of in person appointments. I also discussed with the patient that there may be a patient responsible charge related to this service. The patient expressed understanding and verbally consented to this telephonic visit.    Interactive audio and video telecommunications were attempted between this provider and patient, however failed, due to patient having technical difficulties OR patient did not have access to video capability.  We continued and completed visit with audio only.  Some vital signs may be absent or patient reported.   Time Spent with patient on telephone encounter: 15 minutes   Review of Systems:  Cardiac Risk Factors include: advanced age (>86men, >29 women);diabetes mellitus;dyslipidemia;hypertension     Objective:    Vitals: There were no vitals taken for this visit.  There is no height or weight on file to calculate BMI.  Advanced Directives 10/23/2018 11/27/2017 11/27/2017 11/14/2017 06/11/2016  Does Patient Have a Medical Advance Directive? No No No No No  Would patient like information on creating a medical advance directive? - No - Patient declined No - Patient declined No - Patient declined -    Tobacco Social History   Tobacco Use  Smoking Status Never Smoker  Smokeless Tobacco Never Used     Counseling given: Not Answered   Clinical Intake:  Pre-visit preparation completed: Yes  Pain : 0-10 Pain Score: 7  Pain Type: Chronic pain Pain Location:  Neck Pain Descriptors / Indicators: Aching Pain Onset: More than a month ago Pain Frequency: Constant     Diabetes: No  How often do you need to have someone help you when you read instructions, pamphlets, or other written materials from your doctor or pharmacy?: 1 - Never  Interpreter Needed?: No  Information entered by :: Elijan Googe,LPN  Past Medical History:  Diagnosis Date  . Cancer [facility]) 2013   prostate (radiation and surgery)  . Diabetes mellitus without complication ([place])   . Gout   . Hyperlipidemia   . Hypertension   . Sleep apnea    Past Surgical History:  Procedure Laterality Date  . CIRCUMCISION    . CYST EXCISION Left 2015   arm  . deviated septrum  2003  . JOINT REPLACEMENT Left 2016   Hip  . PROSTATE SURGERY     prostatectomy due to cancer  . PROSTATECTOMY  2013  . TONSILLECTOMY AND ADENOIDECTOMY  2004  . TOTAL HIP ARTHROPLASTY Left 2016  . TOTAL HIP ARTHROPLASTY Right 11/27/2017   Procedure: TOTAL HIP ARTHROPLASTY ANTERIOR APPROACH;  Surgeon: Hessie Knows, MD;  Location: [facility] ORS;  Service: Orthopedics;  Laterality: Right;   Family History  Problem Relation Age of Onset  . Cancer Mother   . Breast cancer Mother   . Stroke Father   . Cancer Sister   . Cancer Maternal Uncle   . Stroke Maternal Uncle    Social History   Socioeconomic History  . Marital status: Married    Spouse name: Not on file  . Number of children: Not on file  . Years of education: Not on file  .  Highest education level: Some college, no degree  Occupational History  . Occupation: retired  Tobacco Use  . Smoking status: Never Smoker  . Smokeless tobacco: Never Used  Substance and Sexual Activity  . Alcohol use: No  . Drug use: No  . Sexual activity: Yes  Other Topics Concern  . Not on file  Social History Narrative  . Not on file   Social Determinants of Health   Financial Resource Strain: Medium Risk  . Difficulty of Paying Living Expenses: Somewhat hard   Food Insecurity: No Food Insecurity  . Worried About Charity fundraiser in the Last Year: Never true  . Ran Out of Food in the Last Year: Never true  Transportation Needs: No Transportation Needs  . Lack of Transportation (Medical): No  . Lack of Transportation (Non-Medical): No  Physical Activity: Inactive  . Days of Exercise per Week: 0 days  . Minutes of Exercise per Session: 0 min  Stress: No Stress Concern Present  . Feeling of Stress : Not at all  Social Connections: Slightly Isolated  . Frequency of Communication with Friends and Family: More than three times a week  . Frequency of Social Gatherings with Friends and Family: More than three times a week  . Attends Religious Services: More than 4 times per year  . Active Member of Clubs or Organizations: No  . Attends Archivist Meetings: Never  . Marital Status: Married    Outpatient Encounter Medications as of 10/15/2019  Medication Sig  . acetaminophen (TYLENOL) 500 MG tablet Take 1-2 tablets (500-1,000 mg total) by mouth every 6 (six) hours as needed for mild pain (pain score 1-3 or temp > 100.5).  Marland Kitchen allopurinol (ZYLOPRIM) 300 MG tablet Take 1 tablet (300 mg total) by mouth daily.  Marland Kitchen amLODipine (NORVASC) 10 MG tablet Take 1 tablet (10 mg total) by mouth daily.  . ASPIRIN 81 PO   . atorvastatin (LIPITOR) 20 MG tablet Take 1 tablet (20 mg total) by mouth daily.  . carvedilol (COREG) 25 MG tablet Take 1 tablet (25 mg total) by mouth 2 (two) times daily with a meal.  . hydrochlorothiazide (HYDRODIURIL) 25 MG tablet Take 1 tablet (25 mg total) by mouth daily.  . insulin aspart (NOVOLOG) 100 UNIT/ML injection Inject into the skin. Using via insulin pump  . losartan (COZAAR) 100 MG tablet Take 1 tablet (100 mg total) by mouth daily.   No facility-administered encounter medications on file as of 10/15/2019.    Activities of Daily Living In your present state of health, do you have any difficulty performing the  following activities: 10/15/2019 10/23/2018  Hearing? N N  Vision? N N  Difficulty concentrating or making decisions? N N  Walking or climbing stairs? N Y  Comment - has had 2 hip replacements   Dressing or bathing? N N  Doing errands, shopping? N N  Preparing Food and eating ? N N  Using the Toilet? N N  In the past six months, have you accidently leaked urine? N N  Do you have problems with loss of bowel control? N N  Managing your Medications? N N  Managing your Finances? N N  Housekeeping or managing your Housekeeping? N N  Some recent data might be hidden    Patient Care Team: Venita Lick, NP as PCP - General (Nurse Practitioner) Gabriel Carina Betsey Holiday, MD as Consulting Physician (Endocrinology) De Hollingshead, [facility] as Pharmacist (Pharmacist)   Assessment:   This is a  routine wellness examination for [facility].  Exercise Activities and Dietary recommendations Current Exercise Habits: The patient does not participate in regular exercise at present, Exercise limited by: None identified  Goals Addressed   None     Fall Risk: Fall Risk  10/15/2019 10/23/2018 08/19/2018 08/15/2017 02/15/2017  Falls in the past year? 0 0 0 No No  Number falls in past yr: 0 - - - -  Injury with Fall? 0 - - - -  Follow up - - Falls evaluation completed - -    FALL RISK PREVENTION PERTAINING TO THE HOME:  Any stairs in or around the home? No  If so, are there any without handrails? No   Home free of loose throw rugs in walkways, pet beds, electrical cords, etc? Yes  Adequate lighting in your home to reduce risk of falls? Yes   ASSISTIVE DEVICES UTILIZED TO PREVENT FALLS:  Life alert? No  Use of a cane, walker or w/c? No  Grab bars in the bathroom? No  Shower chair or bench in shower? No  Elevated toilet seat or a handicapped toilet? No   TIMED UP AND GO:  Unable to perform  Depression Screen PHQ 2/9 Scores 10/15/2019 10/23/2018 08/19/2018 08/15/2017  PHQ - 2 Score 0 0 0 0  PHQ- 9 Score - -  0 0    Cognitive Function     6CIT Screen 10/23/2018  What Year? 0 points  What month? 0 points  What time? 0 points  Count back from 20 0 points  Months in reverse 0 points  Repeat phrase 0 points  Total Score 0    Immunization History  Administered Date(s) Administered  . Fluad Quad(high Dose 65+) 03/28/2019  . Influenza, High Dose Seasonal PF 03/29/2018  . Influenza,inj,Quad PF,6+ Mos 07/11/2016, 02/15/2017  . Influenza-Unspecified 03/26/2015  . PFIZER SARS-COV-2 Vaccination 08/05/2019, 08/26/2019  . Pneumococcal Conjugate-13 03/21/2018  . Pneumococcal Polysaccharide-23 07/02/2019  . Pneumococcal-Unspecified 10/20/1992, 02/20/1999  . Tdap 05/31/2011  . Zoster 07/11/2016    Qualifies for Shingles Vaccine? Yes  Zostavax completed 2018. Due for Shingrix. Education has been provided regarding the importance of this vaccine. Pt has been advised to call insurance company to determine out of pocket expense. Advised may also receive vaccine at local pharmacy or Health Dept. Verbalized acceptance and understanding.  Tdap: up to date   Flu Vaccine:   Pneumococcal Vaccine: completed   Covid-19 Vaccine: Completed vaccines  Screening Tests Health Maintenance  Topic Date Due  . INFLUENZA VACCINE  12/21/2019  . HEMOGLOBIN A1C  02/19/2020  . COLONOSCOPY  06/13/2020  . FOOT EXAM  07/01/2020  . OPHTHALMOLOGY EXAM  09/09/2020  . TETANUS/TDAP  05/30/2021  . COVID-19 Vaccine  Completed  . Hepatitis C Screening  Completed  . PNA vac Low Risk Adult  Completed   Cancer Screenings:  Colorectal Screening: Completed 06/13/2010 Repeat every 10 years  Lung Cancer Screening: (Low Dose CT Chest recommended if Age 51-80 years, 30 pack-year currently smoking OR have quit w/in 15years.) does not qualify.     Additional Screening:  Hepatitis C Screening: does qualify; Completed 2016  Vision Screening: Recommended annual ophthalmology exams for early detection of glaucoma and other  disorders of the eye. Is the patient up to date with their annual eye exam?  Yes   Dental Screening: Recommended annual dental exams for proper oral hygiene  Community Resource Referral:  CRR required this visit?  No        Plan:  I have  personally reviewed and addressed the Medicare Annual Wellness questionnaire and have noted the following in the patient's chart:  A. Medical and social history B. Use of alcohol, tobacco or illicit drugs  C. Current medications and supplements D. Functional ability and status E.  Nutritional status F.  Physical activity G. Advance directives H. List of other physicians I.  Hospitalizations, surgeries, and ER visits in previous 12 months J.  [place] such as hearing and vision if needed, cognitive and depression L. Referrals and appointments   In addition, I have reviewed and discussed with patient certain preventive protocols, quality metrics, and best practice recommendations. A written personalized care plan for preventive services as well as general preventive health recommendations were provided to patient.  Due to this being a telephonic visit, the after visit summary with patients personalized plan was offered to patient via mail or my-chart.Patient would like to access on my-chart   Nicholos Johns, [place]  [id number] Nurse Health Advisor   Nurse Notes: none

## 2019-10-16 NOTE — Telephone Encounter (Signed)
This is the record release I discussed with you.

## 2019-10-17 DIAGNOSIS — M542 Cervicalgia: Secondary | ICD-10-CM | POA: Diagnosis not present

## 2019-10-17 DIAGNOSIS — M5382 Other specified dorsopathies, cervical region: Secondary | ICD-10-CM | POA: Diagnosis not present

## 2019-10-17 DIAGNOSIS — M436 Torticollis: Secondary | ICD-10-CM | POA: Diagnosis not present

## 2019-10-24 DIAGNOSIS — M542 Cervicalgia: Secondary | ICD-10-CM | POA: Diagnosis not present

## 2019-10-28 DIAGNOSIS — M542 Cervicalgia: Secondary | ICD-10-CM | POA: Diagnosis not present

## 2019-10-31 ENCOUNTER — Other Ambulatory Visit: Payer: Self-pay

## 2019-10-31 ENCOUNTER — Encounter: Payer: Self-pay | Admitting: Nurse Practitioner

## 2019-10-31 ENCOUNTER — Ambulatory Visit (INDEPENDENT_AMBULATORY_CARE_PROVIDER_SITE_OTHER): Payer: Medicare HMO | Admitting: Nurse Practitioner

## 2019-10-31 VITALS — BP 114/73 | HR 61 | Temp 98.3°F | Ht 73.0 in | Wt 324.0 lb

## 2019-10-31 DIAGNOSIS — Z6841 Body Mass Index (BMI) 40.0 and over, adult: Secondary | ICD-10-CM | POA: Diagnosis not present

## 2019-10-31 DIAGNOSIS — G4733 Obstructive sleep apnea (adult) (pediatric): Secondary | ICD-10-CM | POA: Diagnosis not present

## 2019-10-31 DIAGNOSIS — Z8546 Personal history of malignant neoplasm of prostate: Secondary | ICD-10-CM

## 2019-10-31 DIAGNOSIS — I10 Essential (primary) hypertension: Secondary | ICD-10-CM | POA: Diagnosis not present

## 2019-10-31 DIAGNOSIS — R3121 Asymptomatic microscopic hematuria: Secondary | ICD-10-CM | POA: Diagnosis not present

## 2019-10-31 DIAGNOSIS — M1A0720 Idiopathic chronic gout, left ankle and foot, without tophus (tophi): Secondary | ICD-10-CM | POA: Diagnosis not present

## 2019-10-31 DIAGNOSIS — E785 Hyperlipidemia, unspecified: Secondary | ICD-10-CM | POA: Diagnosis not present

## 2019-10-31 DIAGNOSIS — E1159 Type 2 diabetes mellitus with other circulatory complications: Secondary | ICD-10-CM

## 2019-10-31 DIAGNOSIS — E1169 Type 2 diabetes mellitus with other specified complication: Secondary | ICD-10-CM | POA: Diagnosis not present

## 2019-10-31 DIAGNOSIS — Z0000 Encounter for general adult medical examination without abnormal findings: Secondary | ICD-10-CM | POA: Diagnosis not present

## 2019-10-31 DIAGNOSIS — Z9641 Presence of insulin pump (external) (internal): Secondary | ICD-10-CM

## 2019-10-31 LAB — UA/M W/RFLX CULTURE, ROUTINE
Bilirubin, UA: NEGATIVE
Glucose, UA: NEGATIVE
Ketones, UA: NEGATIVE
Leukocytes,UA: NEGATIVE
Nitrite, UA: NEGATIVE
Protein,UA: NEGATIVE
Specific Gravity, UA: 1.025 (ref 1.005–1.030)
Urobilinogen, Ur: 0.2 mg/dL (ref 0.2–1.0)
pH, UA: 5.5 (ref 5.0–7.5)

## 2019-10-31 LAB — MICROSCOPIC EXAMINATION: WBC, UA: NONE SEEN /hpf (ref 0–5)

## 2019-10-31 LAB — BAYER DCA HB A1C WAIVED: HB A1C (BAYER DCA - WAIVED): 6.3 % (ref <7.0)

## 2019-10-31 NOTE — Assessment & Plan Note (Signed)
Continues to show 1+ in urine today.  He has not followed up with urology as recommended, but agrees with PCP and plans to follow-up next month.

## 2019-10-31 NOTE — Assessment & Plan Note (Signed)
Continues to use CPAP faithfully every night and denies any issues with this.

## 2019-10-31 NOTE — Assessment & Plan Note (Signed)
Continue to collaborate with endocrinology. 

## 2019-10-31 NOTE — Patient Instructions (Signed)

## 2019-10-31 NOTE — Assessment & Plan Note (Signed)
Chronic, stable.  Continue current medication regimen and adjust as needed.  Uric acid level today.  May need to discontinue HCTZ in future if return of flares presents.

## 2019-10-31 NOTE — Progress Notes (Signed)
BP 114/73   Pulse 61   Temp 98.3 F (36.8 C) (Oral)   Ht 6\' 1"  (1.854 m)   Wt (!) 324 lb (147 kg)   SpO2 99%   BMI 42.75 kg/m    Subjective:    Patient ID: Aaron [name], male    DOB: April 07, 1951, 69 y.o.   MRN: [id number]  HPI: Aaron [name] is a 69 y.o. male presenting on 10/31/2019 for comprehensive medical examination. Current medical complaints include:none  He currently lives with: Interim Problems from his last visit: no   DIABETES Is followed by Dr. Gabriel Carina and last saw in December.  A1C at that time was 6.9%.  Uses insulin pump, per Dr. Gabriel Carina last note 08/26/19: "Medtronic MiniMed 630G insulin pump. His insulin pump was downloaded and reviewed. Basal rates 12 am 4.0 units/hr 8:30 am 2 units/hr 11 am 3 units.hr 2 pm 4 units/hr 24-hr basal = 88 units Humalog  Bolus settings I:C ratios 12 am 1:4, 1 pm 1:3 Sensitivity 10 Target 100-110 Actuve insulin time 4 hrs, max bolus 25 units"  Hypoglycemic episodes:no Polydipsia/polyuria: no Visual disturbance: no Chest pain: no Paresthesias: no Glucose Monitoring: yes             Accucheck frequency: QID             Fasting glucose: <130 on average --- this morning 106             Post prandial: 140 - 150 on average, varies             Evening: < 130, varies             Before meals: < 130 on average Taking Insulin?: yes             Long acting insulin:             Short acting insulin: Blood Pressure Monitoring: not checking Retinal Examination: Up to Date Foot Exam: Up to Date Pneumovax: Up to Date Influenza: Up to Date Aspirin: yes   HYPERTENSION / HYPERLIPIDEMIA Continues on Losartan 100 MG, HCTZ 25 MG daily, Carvedilol 25 MG BID, Amlodipine 10 MG daily.  Takes Lipitor 20 MG.  Has OSA and uses CPAP every night.  At recent visits has had blood in urine and imaging noted a small nonobstructive calculi in the collecting system right kidney 65mm.  He has been followed by Dr. Elenor Quinones in past and had refused to  return to see him, but today he reports he will return after he is finished with his current neck PT.  He is not having any pain with hematuria and no fever, weight loss, or N&V.   Satisfied with current treatment? yes Duration of hypertension: chronic BP monitoring frequency: not checking BP range:  BP medication side effects: no Duration of hyperlipidemia: chronic Cholesterol medication side effects: no Cholesterol supplements: none Medication compliance: good compliance Aspirin: yes Recent stressors: no Recurrent headaches: no Visual changes: no Palpitations: no Dyspnea: no Chest pain: no Lower extremity edema: no Dizzy/lightheaded: no   GOUT Continues on Allopurinol 300 MG daily.  Has not had a flare in years.   Swelling: no Redness: no Trauma: no Recent dietary change or indiscretion: no Fevers: no Nausea/vomiting: no Aggravating factors: foods Alleviating factors: Allopurinol Status:  stable Treatments attempted: Allopurinol   Functional Status Survey: Is the patient deaf or have difficulty hearing?: No Does the patient have difficulty seeing, even when wearing glasses/contacts?: No Does the patient have difficulty  concentrating, remembering, or making decisions?: No Does the patient have difficulty walking or climbing stairs?: No Does the patient have difficulty dressing or bathing?: No Does the patient have difficulty doing errands alone such as visiting a doctor's office or shopping?: No  FALL RISK: Fall Risk  10/31/2019 10/15/2019 10/23/2018 08/19/2018 08/15/2017  Falls in the past year? 0 0 0 0 No  Number falls in past yr: 0 0 - - -  Injury with Fall? 0 0 - - -  Follow up - - - Falls evaluation completed -    Depression Screen Depression screen [facility] 2/9 10/31/2019 10/15/2019 10/23/2018 08/19/2018 08/15/2017  Decreased Interest 0 0 0 0 0  Down, Depressed, Hopeless 0 0 0 0 0  PHQ - 2 Score 0 0 0 0 0  Altered sleeping 0 - - 0 0  Tired, decreased energy 0 - - 0 0    Change in appetite 0 - - 0 0  Feeling bad or failure about yourself  0 - - 0 0  Trouble concentrating 0 - - 0 0  Moving slowly or fidgety/restless 0 - - 0 0  Suicidal thoughts 0 - - 0 0  PHQ-9 Score 0 - - 0 0    Advanced Directives <no information>  Past Medical History:  Past Medical History:  Diagnosis Date  . Cancer [facility]) 2013   prostate (radiation and surgery)  . Diabetes mellitus without complication ([place])   . Gout   . Hyperlipidemia   . Hypertension   . Sleep apnea     Surgical History:  Past Surgical History:  Procedure Laterality Date  . CIRCUMCISION    . CYST EXCISION Left 2015   arm  . deviated septrum  2003  . JOINT REPLACEMENT Left 2016   Hip  . PROSTATE SURGERY     prostatectomy due to cancer  . PROSTATECTOMY  2013  . TONSILLECTOMY AND ADENOIDECTOMY  2004  . TOTAL HIP ARTHROPLASTY Left 2016  . TOTAL HIP ARTHROPLASTY Right 11/27/2017   Procedure: TOTAL HIP ARTHROPLASTY ANTERIOR APPROACH;  Surgeon: Hessie Knows, MD;  Location: [facility] ORS;  Service: Orthopedics;  Laterality: Right;    Medications:  Current Outpatient Medications on File Prior to Visit  Medication Sig  . acetaminophen (TYLENOL) 500 MG tablet Take 1-2 tablets (500-1,000 mg total) by mouth every 6 (six) hours as needed for mild pain (pain score 1-3 or temp > 100.5).  Marland Kitchen allopurinol (ZYLOPRIM) 300 MG tablet Take 1 tablet (300 mg total) by mouth daily.  Marland Kitchen amLODipine (NORVASC) 10 MG tablet Take 1 tablet (10 mg total) by mouth daily.  . ASPIRIN 81 PO   . atorvastatin (LIPITOR) 20 MG tablet Take 1 tablet (20 mg total) by mouth daily.  . carvedilol (COREG) 25 MG tablet Take 1 tablet (25 mg total) by mouth 2 (two) times daily with a meal.  . hydrochlorothiazide (HYDRODIURIL) 25 MG tablet Take 1 tablet (25 mg total) by mouth daily.  . insulin aspart (NOVOLOG) 100 UNIT/ML injection Inject into the skin. Using via insulin pump  . losartan (COZAAR) 100 MG tablet Take 1 tablet (100 mg total) by mouth  daily.   No current facility-administered medications on file prior to visit.    Allergies:  Allergies  Allergen Reactions  . Glucophage [Metformin] Diarrhea    Social History:  Social History   Socioeconomic History  . Marital status: Married    Spouse name: Not on file  . Number of children: Not on file  . Years  of education: Not on file  . Highest education level: Some college, no degree  Occupational History  . Occupation: retired  Tobacco Use  . Smoking status: Never Smoker  . Smokeless tobacco: Never Used  Vaping Use  . Vaping Use: Never used  Substance and Sexual Activity  . Alcohol use: No  . Drug use: No  . Sexual activity: Yes  Other Topics Concern  . Not on file  Social History Narrative  . Not on file   Social Determinants of Health   Financial Resource Strain: Medium Risk  . Difficulty of Paying Living Expenses: Somewhat hard  Food Insecurity:   . Worried About Charity fundraiser in the Last Year:   . Arboriculturist in the Last Year:   Transportation Needs:   . Film/video editor (Medical):   Marland Kitchen Lack of Transportation (Non-Medical):   Physical Activity:   . Days of Exercise per Week:   . Minutes of Exercise per Session:   Stress:   . Feeling of Stress :   Social Connections:   . Frequency of Communication with Friends and Family:   . Frequency of Social Gatherings with Friends and Family:   . Attends Religious Services:   . Active Member of Clubs or Organizations:   . Attends Archivist Meetings:   Marland Kitchen Marital Status:   Intimate Partner Violence:   . Fear of Current or Ex-Partner:   . Emotionally Abused:   Marland Kitchen Physically Abused:   . Sexually Abused:    Social History   Tobacco Use  Smoking Status Never Smoker  Smokeless Tobacco Never Used   Social History   Substance and Sexual Activity  Alcohol Use No    Family History:  Family History  Problem Relation Age of Onset  . Cancer Mother   . Breast cancer Mother   .  Stroke Father   . Cancer Sister   . Cancer Maternal Uncle   . Stroke Maternal Uncle     Past medical history, surgical history, medications, allergies, family history and social history reviewed with patient today and changes made to appropriate areas of the chart.   Review of Systems - negative All other ROS negative except what is listed above and in the HPI.      Objective:    BP 114/73   Pulse 61   Temp 98.3 F (36.8 C) (Oral)   Ht 6\' 1"  (1.854 m)   Wt (!) 324 lb (147 kg)   SpO2 99%   BMI 42.75 kg/m   Wt Readings from Last 3 Encounters:  10/31/19 (!) 324 lb (147 kg)  09/08/19 (!) 330 lb 2 oz (149.7 kg)  08/19/18 (!) 315 lb (142.9 kg)    Physical Exam Vitals and nursing note reviewed.  Constitutional:      General: He is awake. He is not in acute distress.    Appearance: He is well-developed. He is morbidly obese. He is not ill-appearing.  HENT:     Head: Normocephalic and atraumatic.     Right Ear: Hearing, tympanic membrane, ear canal and external ear normal. No drainage.     Left Ear: Hearing, tympanic membrane, ear canal and external ear normal. No drainage.     Nose: Nose normal.     Mouth/Throat:     Pharynx: Uvula midline.  Eyes:     General: Lids are normal.        Right eye: No discharge.  Left eye: No discharge.     Extraocular Movements: Extraocular movements intact.     Conjunctiva/sclera: Conjunctivae normal.     Pupils: Pupils are equal, round, and reactive to light.     Visual Fields: Right eye visual fields normal and left eye visual fields normal.  Neck:     Thyroid: No thyromegaly.     Vascular: No carotid bruit or JVD.     Trachea: Trachea normal.  Cardiovascular:     Rate and Rhythm: Normal rate and regular rhythm.     Heart sounds: Normal heart sounds, S1 normal and S2 normal. No murmur heard.  No gallop.   Pulmonary:     Effort: Pulmonary effort is normal. No accessory muscle usage or respiratory distress.     Breath sounds:  Normal breath sounds.  Abdominal:     General: Bowel sounds are normal.     Palpations: Abdomen is soft. There is no hepatomegaly or splenomegaly.     Tenderness: There is no abdominal tenderness.  Musculoskeletal:        General: Normal range of motion.     Cervical back: Normal range of motion and neck supple.     Right lower leg: No edema.     Left lower leg: No edema.  Lymphadenopathy:     Head:     Right side of head: No submental, submandibular, tonsillar, preauricular or posterior auricular adenopathy.     Left side of head: No submental, submandibular, tonsillar, preauricular or posterior auricular adenopathy.     Cervical: No cervical adenopathy.  Skin:    General: Skin is warm and dry.     Capillary Refill: Capillary refill takes less than 2 seconds.     Findings: No rash.  Neurological:     Mental Status: He is alert and oriented to person, place, and time.     Cranial Nerves: Cranial nerves are intact.     Gait: Gait is intact.     Deep Tendon Reflexes: Reflexes are normal and symmetric.     Reflex Scores:      Brachioradialis reflexes are 2+ on the right side and 2+ on the left side.      Patellar reflexes are 2+ on the right side and 2+ on the left side. Psychiatric:        Attention and Perception: Attention normal.        Mood and Affect: Mood normal.        Speech: Speech normal.        Behavior: Behavior normal. Behavior is cooperative.        Thought Content: Thought content normal.        Cognition and Memory: Cognition normal.        Judgment: Judgment normal.      Results for orders placed or performed in visit on 10/31/19  Microscopic Examination   Urine  Result Value Ref Range   WBC, UA None seen 0 - 5 /hpf   RBC 0-2 0 - 2 /hpf   Epithelial Cells (non renal) 0-10 0 - 10 /hpf   Bacteria, UA Few (A) None seen/Few  Bayer DCA Hb A1c Waived  Result Value Ref Range   HB A1C (BAYER DCA - WAIVED) 6.3 <7.0 %  UA/M w/rflx Culture, Routine   Specimen:  Urine   Urine  Result Value Ref Range   Specific Gravity, UA 1.025 1.005 - 1.030   pH, UA 5.5 5.0 - 7.5   Color, UA Yellow Yellow  Appearance Ur Clear Clear   Leukocytes,UA Negative Negative   Protein,UA Negative Negative/Trace   Glucose, UA Negative Negative   Ketones, UA Negative Negative   RBC, UA 1+ (A) Negative   Bilirubin, UA Negative Negative   Urobilinogen, Ur 0.2 0.2 - 1.0 mg/dL   Nitrite, UA Negative Negative   Microscopic Examination See below:       Assessment & Plan:   Problem List Items Addressed This Visit      Cardiovascular and Mediastinum   Hypertension associated with diabetes (HCC)    Chronic, ongoing.  BP at goal today.  Recommend he check BP at home twice daily and document for provider visits.  Continue current medication regimen at this time, is on max dose multiple medications.  May need to adjust in future and change HCTZ if return of gout flares presents.  CMP today.  Return in 3 months.      Relevant Orders   Bayer DCA Hb A1c Waived (Completed)   TSH   UA/M w/rflx Culture, Routine (Completed)     Respiratory   Sleep apnea    Continues to use CPAP faithfully every night and denies any issues with this.        Endocrine   Diabetes mellitus associated with hormonal etiology ([place])    Chronic, ongoing, followed by endocrinology with insulin pump in place.  Continue current collaboration and pump settings per endo.  A1C today 6.3%.  Recommend focus on diet and modest weight loss.  Does endorse poor diet.  Recommend he continue to monitor BS closely at home and if any consistent <70 immediately alert endo for pump changes.  Return in 3 months.      Relevant Orders   Bayer DCA Hb A1c Waived (Completed)   Hyperlipidemia associated with type 2 diabetes mellitus (HCC)    Chronic, ongoing.  Continue current medication regimen and adjust as needed.  Lipid panel today.         Relevant Orders   Bayer DCA Hb A1c Waived (Completed)   Comprehensive  metabolic panel   Lipid Panel w/o Chol/HDL Ratio     Genitourinary   Asymptomatic microscopic hematuria    Continues to show 1+ in urine today.  He has not followed up with urology as recommended, but agrees with PCP and plans to follow-up next month.          Other   Gout    Chronic, stable.  Continue current medication regimen and adjust as needed.  Uric acid level today.  May need to discontinue HCTZ in future if return of flares presents.      Relevant Orders   Uric acid   History of prostate cancer    Continue to collaborate with urology as needed.      BMI 40.0-44.9, adult ([place])    Recommend focus on modest weight loss with diet changes and regular exercise regimen.        Morbid obesity ([place])    Recommended eating smaller high protein, low fat meals more frequently and exercising 30 mins a day 5 times a week with a goal of 10-15lb weight loss in the next 3 months. Patient voiced their understanding and motivation to adhere to these recommendations.       Presence of insulin pump    Continue to collaborate with endocrinology.       Other Visit Diagnoses    Encounter for annual physical exam    -  Primary  Discussed aspirin prophylaxis for myocardial infarction prevention and decision was made to continue ASA  LABORATORY TESTING:  Health maintenance labs ordered today as discussed above.   IMMUNIZATIONS:   - Tdap: Tetanus vaccination status reviewed: last tetanus booster within 10 years. - Influenza: Up to date - Pneumovax: Up to date - Prevnar: Up to date - Zostavax vaccine: will talk to insurance  SCREENING: - Colonoscopy: Up to date  Discussed with patient purpose of the colonoscopy is to detect colon cancer at curable precancerous or early stages   - AAA Screening: Not applicable  -Hearing Test: Not applicable  -Spirometry: Not applicable   PATIENT COUNSELING:    Sexuality: Discussed sexually transmitted diseases, partner selection, use of  condoms, avoidance of unintended pregnancy  and contraceptive alternatives.   Advised to avoid cigarette smoking.  I discussed with the patient that most people either abstain from alcohol or drink within safe limits (<=14/week and <=4 drinks/occasion for males, <=7/weeks and <= 3 drinks/occasion for females) and that the risk for alcohol disorders and other health effects rises proportionally with the number of drinks per week and how often a drinker exceeds daily limits.  Discussed cessation/primary prevention of drug use and availability of treatment for abuse.   Diet: Encouraged to adjust caloric intake to maintain  or achieve ideal body weight, to reduce intake of dietary saturated fat and total fat, to limit sodium intake by avoiding high sodium foods and not adding table salt, and to maintain adequate dietary potassium and calcium preferably from fresh fruits, vegetables, and low-fat dairy products.    stressed the importance of regular exercise  Injury prevention: Discussed safety belts, safety helmets, smoke detector, smoking near bedding or upholstery.   Dental health: Discussed importance of regular tooth brushing, flossing, and dental visits.   Follow up plan: NEXT PREVENTATIVE PHYSICAL DUE IN 1 YEAR. Return in about 3 months (around 01/31/2020) for T2DM ,HTN/HLD.

## 2019-10-31 NOTE — Assessment & Plan Note (Signed)
Chronic, ongoing, followed by endocrinology with insulin pump in place.  Continue current collaboration and pump settings per endo.  A1C today 6.3%.  Recommend focus on diet and modest weight loss.  Does endorse poor diet.  Recommend he continue to monitor BS closely at home and if any consistent <70 immediately alert endo for pump changes.  Return in 3 months.

## 2019-10-31 NOTE — Assessment & Plan Note (Signed)
Recommend focus on modest weight loss with diet changes and regular exercise regimen.   

## 2019-10-31 NOTE — Assessment & Plan Note (Signed)
Continue to collaborate with urology as needed. 

## 2019-10-31 NOTE — Assessment & Plan Note (Signed)
Chronic, ongoing.  BP at goal today.  Recommend he check BP at home twice daily and document for provider visits.  Continue current medication regimen at this time, is on max dose multiple medications.  May need to adjust in future and change HCTZ if return of gout flares presents.  CMP today.  Return in 3 months.

## 2019-10-31 NOTE — Assessment & Plan Note (Signed)
Chronic, ongoing.  Continue current medication regimen and adjust as needed. Lipid panel today. 

## 2019-10-31 NOTE — Assessment & Plan Note (Signed)
Recommended eating smaller high protein, low fat meals more frequently and exercising 30 mins a day 5 times a week with a goal of 10-15lb weight loss in the next 3 months. Patient voiced their understanding and motivation to adhere to these recommendations.  

## 2019-11-01 LAB — COMPREHENSIVE METABOLIC PANEL
ALT: 12 IU/L (ref 0–44)
AST: 12 IU/L (ref 0–40)
Albumin/Globulin Ratio: 1.6 (ref 1.2–2.2)
Albumin: 4.2 g/dL (ref 3.8–4.8)
Alkaline Phosphatase: 145 IU/L — ABNORMAL HIGH (ref 48–121)
BUN/Creatinine Ratio: 16 (ref 10–24)
BUN: 20 mg/dL (ref 8–27)
Bilirubin Total: 0.4 mg/dL (ref 0.0–1.2)
CO2: 25 mmol/L (ref 20–29)
Calcium: 9.2 mg/dL (ref 8.6–10.2)
Chloride: 104 mmol/L (ref 96–106)
Creatinine, Ser: 1.22 mg/dL (ref 0.76–1.27)
GFR calc Af Amer: 70 mL/min/{1.73_m2} (ref >59)
GFR calc non Af Amer: 61 mL/min/{1.73_m2} (ref >59)
Globulin, Total: 2.6 g/dL (ref 1.5–4.5)
Glucose: 96 mg/dL (ref 65–99)
Potassium: 4 mmol/L (ref 3.5–5.2)
Sodium: 143 mmol/L (ref 134–144)
Total Protein: 6.8 g/dL (ref 6.0–8.5)

## 2019-11-01 LAB — LIPID PANEL W/O CHOL/HDL RATIO
Cholesterol, Total: 94 mg/dL — ABNORMAL LOW (ref 100–199)
HDL: 33 mg/dL — ABNORMAL LOW (ref >39)
LDL Chol Calc (NIH): 45 mg/dL (ref 0–99)
Triglycerides: 79 mg/dL (ref 0–149)
VLDL Cholesterol Cal: 16 mg/dL (ref 5–40)

## 2019-11-01 LAB — URIC ACID: Uric Acid: 6.1 mg/dL (ref 3.8–8.4)

## 2019-11-01 LAB — TSH: TSH: 2.61 u[IU]/mL (ref 0.450–4.500)

## 2019-11-02 NOTE — Progress Notes (Signed)
Contacted via [place] evening [place], your labs have returned and overall everything looks good with exception of some mild elevation in alkaline phosphatase, this can at times be related to gall bladder.  Do you still have a gall bladder?  Any past surgeries on it?  We will continue to monitor this number to ensure it stays stable and does not trend upwards, if it does we may consider imaging.  Any questions?   Keep being awesome!! Kindest regards, Benjerman Molinelli

## 2019-11-06 DIAGNOSIS — G4733 Obstructive sleep apnea (adult) (pediatric): Secondary | ICD-10-CM | POA: Diagnosis not present

## 2019-11-10 DIAGNOSIS — M542 Cervicalgia: Secondary | ICD-10-CM | POA: Diagnosis not present

## 2019-11-10 DIAGNOSIS — E119 Type 2 diabetes mellitus without complications: Secondary | ICD-10-CM | POA: Diagnosis not present

## 2019-11-11 ENCOUNTER — Ambulatory Visit (INDEPENDENT_AMBULATORY_CARE_PROVIDER_SITE_OTHER): Payer: Medicare HMO | Admitting: Pharmacist

## 2019-11-11 ENCOUNTER — Telehealth: Payer: Self-pay

## 2019-11-11 DIAGNOSIS — I10 Essential (primary) hypertension: Secondary | ICD-10-CM | POA: Diagnosis not present

## 2019-11-11 DIAGNOSIS — R3121 Asymptomatic microscopic hematuria: Secondary | ICD-10-CM

## 2019-11-11 DIAGNOSIS — E1169 Type 2 diabetes mellitus with other specified complication: Secondary | ICD-10-CM

## 2019-11-11 DIAGNOSIS — E1159 Type 2 diabetes mellitus with other circulatory complications: Secondary | ICD-10-CM

## 2019-11-11 DIAGNOSIS — E785 Hyperlipidemia, unspecified: Secondary | ICD-10-CM

## 2019-11-11 DIAGNOSIS — M1A0720 Idiopathic chronic gout, left ankle and foot, without tophus (tophi): Secondary | ICD-10-CM

## 2019-11-11 NOTE — Patient Instructions (Signed)
Visit Information  Goals Addressed              This Visit's Progress     Patient Stated     "I want to stay healthy" (pt-stated)        CARE PLAN ENTRY (see longtitudinal plan of care for additional care plan information)  Current Barriers:   Diabetes: controlled; most recent A1c 6.3%; follows w/ Dr. Gabriel Carina  o Currently struggling w/ neck pain. Undergoing PT, has gone 4 times, has 8 remaining, but has not felt any benefit yet. Notes he has f/u with ortho in November.  o Microscopic hematuria. Patient notes stones on abdominal CT, he believes these are causing blood in his urine. Has not scheduled w/ urology yet, but notes that he plans to do so before his next appt w/ PCP  Current antihyperglycemic regimen: Novolog via pump o Receiving Novolog vial assistance through [name] through 05/21/20. Notes he is about to be due for a refill  Cardiovascular risk reduction: o Current hypertensive regimen: amlodipine 10 mg daily, carvedilol 25 mg BID, losartan 100 mg daily, HCTZ 25 mg daily. Has not been checking at home, has not pursued getting a BP machine. Notes that it is on his to do list o Current hyperlipidemia regimen: atorvastatin 20 mg daily; LDL at goal <70 o Current antiplatelet regimen: ASA 81 mg daily   Gout: allopurinol 300 mg daily; uric acid at goal ~6. No recent gout flares   Pharmacist Clinical Goal(s):   Over the next 90 days, patient with work with PharmD and primary care provider to address maintained glycemic control  Interventions:  Comprehensive medication review performed, medication list updated in electronic medical record  Inter-disciplinary care team collaboration (see longitudinal plan of care)  Praised for continued improvement in A1c. Reviewed to contact endo if hypoglycemia develops for pump adjustments.   Encouraged to go ahead and call Dr. Joycie Peek office to ask them to place a refill request for Novolog from [employer], as Novo is  running a few weeks behind in shipping. He verbalizes understanding  Reiterated importance of home BP monitoring, given multiple BP medications. Fill history is up to date. Patient confirms he will pursue getting a BP machine  Praised for maintenance of goal lipid levels  Encouraged to call urology ASAP to schedule an appointment for appropriate work up for hematuria. He verbalized understanding.   Patient Self Care Activities:   Patient will check blood glucose regularly, document, and provide at future appointments  Patient will take medications as prescribed  Patient will report any questions or concerns to provider   Please see past updates related to this goal by clicking on the "Past Updates" button in the selected goal         The patient verbalized understanding of instructions provided today and declined a print copy of patient instruction materials.   Plan:  - Scheduled f/u call in ~13 weeks  [name], PharmD, [place] [phone]

## 2019-11-11 NOTE — Chronic Care Management (AMB) (Signed)
Chronic Care Management   Follow Up Note   11/11/2019 Name: Aaron [name] MRN: [id number] DOB: 1951-03-07  Referred by: Venita Lick, NP Reason for referral : Chronic Care Management (Medication Management)   Aaron [name] is a 69 y.o. year old male who is a primary care patient of Cannady, Barbaraann Faster, NP. The CCM team was consulted for assistance with chronic disease management and care coordination needs.    Contacted patient for medication management review.   Review of patient status, including review of consultants reports, relevant laboratory and other test results, and collaboration with appropriate care team members and the patient's provider was performed as part of comprehensive patient evaluation and provision of chronic care management services.    SDOH (Social Determinants of Health) assessments performed: Yes See Care Plan activities for detailed interventions related to SDOH)  SDOH Interventions     Most Recent Value  SDOH Interventions  Financial Strain Interventions Other (Comment)  [medication assistance]       Outpatient Encounter Medications as of 11/11/2019  Medication Sig Note  . acetaminophen (TYLENOL) 500 MG tablet Take 1-2 tablets (500-1,000 mg total) by mouth every 6 (six) hours as needed for mild pain (pain score 1-3 or temp > 100.5).   Marland Kitchen allopurinol (ZYLOPRIM) 300 MG tablet Take 1 tablet (300 mg total) by mouth daily.   Marland Kitchen amLODipine (NORVASC) 10 MG tablet Take 1 tablet (10 mg total) by mouth daily. 11/11/2019: QPM  . ASPIRIN 81 PO    . atorvastatin (LIPITOR) 20 MG tablet Take 1 tablet (20 mg total) by mouth daily.   . carvedilol (COREG) 25 MG tablet Take 1 tablet (25 mg total) by mouth 2 (two) times daily with a meal.   . hydrochlorothiazide (HYDRODIURIL) 25 MG tablet Take 1 tablet (25 mg total) by mouth daily.   . insulin aspart (NOVOLOG) 100 UNIT/ML injection Inject into the skin. Using via insulin pump   . losartan (COZAAR) 100 MG tablet Take  1 tablet (100 mg total) by mouth daily. 11/11/2019: QPM   No facility-administered encounter medications on file as of 11/11/2019.     Objective:   Goals Addressed              This Visit's Progress     Patient Stated   .  "I want to stay healthy" (pt-stated)        CARE PLAN ENTRY (see longtitudinal plan of care for additional care plan information)  Current Barriers:  . Diabetes: controlled; most recent A1c 6.3%; follows w/ Dr. Gabriel Carina  o Currently struggling w/ neck pain. Undergoing PT, has gone 4 times, has 8 remaining, but has not felt any benefit yet. Notes he has f/u with ortho in November.  o Microscopic hematuria. Patient notes stones on abdominal CT, he believes these are causing blood in his urine. Has not scheduled w/ urology yet, but notes that he plans to do so before his next appt w/ PCP . Current antihyperglycemic regimen: Novolog via pump o Receiving Novolog vial assistance through [name] through 05/21/20. Notes he is about to be due for a refill . Cardiovascular risk reduction: o Current hypertensive regimen: amlodipine 10 mg daily, carvedilol 25 mg BID, losartan 100 mg daily, HCTZ 25 mg daily. Has not been checking at home, has not pursued getting a BP machine. Notes that it is on his to do list o Current hyperlipidemia regimen: atorvastatin 20 mg daily; LDL at goal <70 o Current antiplatelet regimen: ASA 81 mg  daily  . Gout: allopurinol 300 mg daily; uric acid at goal ~6. No recent gout flares   Pharmacist Clinical Goal(s):  Marland Kitchen Over the next 90 days, patient with work with PharmD and primary care provider to address maintained glycemic control  Interventions: . Comprehensive medication review performed, medication list updated in electronic medical record . Inter-disciplinary care team collaboration (see longitudinal plan of care) . Praised for continued improvement in A1c. Reviewed to contact endo if hypoglycemia develops for pump adjustments.   . Encouraged to go ahead and call Dr. Joycie Peek office to ask them to place a refill request for Novolog from [employer], as Novo is running a few weeks behind in shipping. He verbalizes understanding . Reiterated importance of home BP monitoring, given multiple BP medications. Fill history is up to date. Patient confirms he will pursue getting a BP machine . Praised for maintenance of goal lipid levels . Encouraged to call urology ASAP to schedule an appointment for appropriate work up for hematuria. He verbalized understanding.   Patient Self Care Activities:  . Patient will check blood glucose regularly, document, and provide at future appointments . Patient will take medications as prescribed . Patient will report any questions or concerns to provider   Please see past updates related to this goal by clicking on the "Past Updates" button in the selected goal          Plan:  - Scheduled f/u call in ~13 weeks  [name], PharmD, [place] [phone]

## 2019-11-12 DIAGNOSIS — M542 Cervicalgia: Secondary | ICD-10-CM | POA: Diagnosis not present

## 2019-11-19 DIAGNOSIS — M436 Torticollis: Secondary | ICD-10-CM | POA: Diagnosis not present

## 2019-11-25 DIAGNOSIS — M436 Torticollis: Secondary | ICD-10-CM | POA: Diagnosis not present

## 2019-12-19 DIAGNOSIS — E113293 Type 2 diabetes mellitus with mild nonproliferative diabetic retinopathy without macular edema, bilateral: Secondary | ICD-10-CM | POA: Diagnosis not present

## 2019-12-19 DIAGNOSIS — Z794 Long term (current) use of insulin: Secondary | ICD-10-CM | POA: Diagnosis not present

## 2019-12-26 ENCOUNTER — Ambulatory Visit: Payer: Medicare HMO | Admitting: Urology

## 2019-12-26 DIAGNOSIS — E669 Obesity, unspecified: Secondary | ICD-10-CM | POA: Diagnosis not present

## 2019-12-26 DIAGNOSIS — Z794 Long term (current) use of insulin: Secondary | ICD-10-CM | POA: Diagnosis not present

## 2019-12-26 DIAGNOSIS — Z9641 Presence of insulin pump (external) (internal): Secondary | ICD-10-CM | POA: Diagnosis not present

## 2019-12-26 DIAGNOSIS — E1169 Type 2 diabetes mellitus with other specified complication: Secondary | ICD-10-CM | POA: Diagnosis not present

## 2019-12-26 DIAGNOSIS — E113293 Type 2 diabetes mellitus with mild nonproliferative diabetic retinopathy without macular edema, bilateral: Secondary | ICD-10-CM | POA: Diagnosis not present

## 2019-12-28 NOTE — Progress Notes (Signed)
Greater than  12/29/2019 11:07 AM   Aaron [name] 06-06-1950 [id number]  Referring provider: Venita Lick, NP [address] [place],  [place] [address] Chief Complaint  Patient presents with  . Hematuria    HPI: Aaron [name] is a 68 y.o. male with a history of gross hematuria present today for re-evaluation.   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  -He was seen August 2016 for total gross painless hematuria. CT urogram showed no significant upper tract abnormalities. There was a punctate intraparenchymal calcification in the right kidney. 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     -His last PSA was in March 2019 and was undetectable at <0.1. -Patient was last seen in clinic on 12/27/2017 for a cysto.  -Noted to have son 30 RBCs on microscopy by PCP and repeat CT hematuria ordered and performed on 09/17/19 showed small nonobstructive calculi in the collecting system of the right kidney measuring up to 3 mm in the interpolar region. No ureteral stones or findings of urinary tract obstruction were noted at that time. No definite evidence of metastatic disease in this patient with history of prostate cancer.  -Reports mild hematuria with constipation x 2 weeks ago. -Denies dysuria -No flank, abdominal or pelvic pain   PMH: Past Medical History:  Diagnosis Date  . Cancer [facility]) 2013   prostate (radiation and surgery)  . Diabetes mellitus without complication ([address])   . Gout   . Hyperlipidemia   . Hypertension   . Sleep apnea     Surgical History: Past Surgical History:  Procedure Laterality Date  . CIRCUMCISION    . CYST EXCISION Left 2015   arm  . deviated septrum  2003  . JOINT REPLACEMENT Left 2016   Hip  . PROSTATE  SURGERY     prostatectomy due to cancer  . PROSTATECTOMY  2013  . TONSILLECTOMY AND ADENOIDECTOMY  2004  . TOTAL HIP ARTHROPLASTY Left 2016  . TOTAL HIP ARTHROPLASTY Right 11/27/2017   Procedure: TOTAL HIP ARTHROPLASTY ANTERIOR APPROACH;  Surgeon: Hessie Knows, MD;  Location: [facility] ORS;  Service: Orthopedics;  Laterality: Right;    Home Medications:  Allergies as of 12/29/2019      Reactions   Glucophage [metformin] Diarrhea      Medication List       Accurate as of December 29, 2019 11:07 AM. If you have any questions, ask your nurse or doctor.        acetaminophen 500 MG tablet Commonly known as: TYLENOL Take 1-2 tablets (500-1,000 mg total) by mouth every 6 (six) hours as needed for mild pain (pain score 1-3 or temp > 100.5).   allopurinol 300 MG tablet Commonly known as: ZYLOPRIM Take 1 tablet (300 mg total) by mouth daily.   amLODipine 10 MG tablet Commonly known as: NORVASC Take 1 tablet (10 mg total) by mouth daily.   ASPIRIN 81 PO   atorvastatin 20 MG tablet Commonly known as: LIPITOR Take 1 tablet (20 mg total) by mouth daily.   carvedilol 25 MG tablet Commonly known as: COREG Take 1 tablet (25 mg total) by mouth 2 (two) times daily with a meal.   hydrochlorothiazide 25 MG tablet Commonly known as: HYDRODIURIL Take 1 tablet (25 mg total) by mouth daily.   insulin aspart 100  UNIT/ML injection Commonly known as: novoLOG Inject into the skin. Using via insulin pump   losartan 100 MG tablet Commonly known as: COZAAR Take 1 tablet (100 mg total) by mouth daily.       Allergies:  Allergies  Allergen Reactions  . Glucophage [Metformin] Diarrhea    Family History: Family History  Problem Relation Age of Onset  . Cancer Mother   . Breast cancer Mother   . Stroke Father   . Cancer Sister   . Cancer Maternal Uncle   . Stroke Maternal Uncle     Social History:  reports that he has never smoked. He has never used smokeless tobacco. He reports that he  does not drink alcohol and does not use drugs.   Physical Exam: BP (!) 176/80   Pulse 76   Ht 6\' 3"  (1.905 m)   Wt (!) 327 lb 8 oz (148.6 kg)   BMI 40.93 kg/m   Constitutional:  Alert and oriented, No acute distress. HEENT: [place] AT, moist mucus membranes.  Trachea midline, no masses. Cardiovascular: No clubbing, cyanosis, or edema. Respiratory: Normal respiratory effort, no increased work of breathing. Skin: No rashes, bruises or suspicious lesions. Neurologic: Grossly intact, no focal deficits, moving all 4 extremities. Psychiatric: Normal mood and affect.  Laboratory Data:  Lab Results  Component Value Date   CREATININE 1.22 10/31/2019    Urinalysis Dipstick 2+ blood/microscopy 3-10 RBC  Pertinent Imaging:   CT HEMATURIA WORKUP  Narrative CLINICAL DATA:  69 year old male with history of 3 days of gross hematuria 2 weeks ago. History of prostate cancer.  EXAM: CT ABDOMEN AND PELVIS WITHOUT AND WITH CONTRAST  TECHNIQUE: Multidetector CT imaging of the abdomen and pelvis was performed following the standard protocol before and following the bolus administration of intravenous contrast.  CONTRAST:  11mL OMNIPAQUE IOHEXOL 300 MG/ML  SOLN  COMPARISON:  CT the abdomen and pelvis 01/02/2018.  FINDINGS: Lower chest: Atherosclerotic calcifications in the left anterior descending and right coronary arteries.  Hepatobiliary: Mild diffuse low attenuation throughout the hepatic parenchyma, indicative of hepatic steatosis. No suspicious cystic or solid hepatic lesions. No intra or extrahepatic biliary ductal dilatation. 6 mm calcified gallstone lying dependently in the neck of the gallbladder. No findings to suggest an acute cholecystitis at this time.  Pancreas: No pancreatic mass. No pancreatic ductal dilatation. No pancreatic or peripancreatic fluid collections or inflammatory changes.  Spleen: Unremarkable.  Adrenals/Urinary Tract: There are 2 tiny  nonobstructive calculi in the right renal collecting system, largest of which measures only 3 mm in the interpolar region. No additional calculi are identified within the left renal collecting system, along the course of either ureter or within the lumen of the urinary bladder. No hydroureteronephrosis. No suspicious renal lesions. Postcontrast delayed images demonstrate no definite filling defect within the collecting system of either kidney, along the course of either ureter, or within the lumen of the urinary bladder to strongly suggest the presence of urothelial neoplasm at this time. Urinary bladder is nearly decompressed, but otherwise unremarkable in appearance.  Stomach/Bowel: Normal appearance of the stomach. No pathologic dilatation of small bowel or colon. A few scattered colonic diverticulae are noted, without surrounding inflammatory changes to suggest an acute diverticulitis at this time. Normal appendix.  Vascular/Lymphatic: Aortic atherosclerosis. No lymphadenopathy noted in the abdomen or pelvis.  Reproductive: Status post radical prostatectomy.  Other: No significant volume of ascites.  No pneumoperitoneum.  Musculoskeletal: Cavernous hemangioma of L1 vertebral body, similar to the prior study. Status post  bilateral hip arthroplasty.  IMPRESSION: 1. Small nonobstructive calculi in the collecting system of the right kidney measuring up to 3 mm in the interpolar region. No ureteral stones or findings of urinary tract obstruction are noted at this time. 2. No definite evidence of metastatic disease in this patient with history of prostate cancer. 3. Mild hepatic steatosis. 4. Cholelithiasis without evidence of acute cholecystitis. 5. Aortic atherosclerosis, in addition to 2 vessel coronary artery disease. Please note that although the presence of coronary artery calcium documents the presence of coronary artery disease, the severity of this disease and any  potential stenosis cannot be assessed on this non-gated CT examination. Assessment for potential risk factor modification, dietary therapy or pharmacologic therapy may be warranted, if clinically indicated. 6. Additional incidental findings, similar to the prior study, as above.   Electronically Signed By: Vinnie Langton M.D. On: 09/17/2019 08:57   I have personally reviewed the images and agree with radiologist interpretation.     Assessment & Plan:    1.  Gross hematuria  UA today shows microscopic blood  Recent CT hematuria work up showed small renal calculi with no other upper tract abnormalities.  Recommend repeat cystoscopy to further complete hematuria evaluation and evaluate lower tract.   [facility] [address], [place], [place] [address] [phone]  I, Selena Batten, am acting as a scribe for Dr. Nicki Reaper C. Harish Bram,  I have reviewed the above documentation for accuracy and completeness, and I agree with the above.   Abbie Sons, MD

## 2019-12-29 ENCOUNTER — Other Ambulatory Visit: Payer: Self-pay

## 2019-12-29 ENCOUNTER — Encounter: Payer: Self-pay | Admitting: Urology

## 2019-12-29 ENCOUNTER — Ambulatory Visit: Payer: Medicare HMO | Admitting: Urology

## 2019-12-29 VITALS — BP 176/80 | HR 76 | Ht 75.0 in | Wt 327.5 lb

## 2019-12-29 DIAGNOSIS — N029 Recurrent and persistent hematuria with unspecified morphologic changes: Secondary | ICD-10-CM

## 2019-12-29 LAB — MICROSCOPIC EXAMINATION: Bacteria, UA: NONE SEEN

## 2019-12-29 LAB — URINALYSIS, COMPLETE
Bilirubin, UA: NEGATIVE
Glucose, UA: NEGATIVE
Ketones, UA: NEGATIVE
Leukocytes,UA: NEGATIVE
Nitrite, UA: NEGATIVE
Protein,UA: NEGATIVE
Specific Gravity, UA: 1.02 (ref 1.005–1.030)
Urobilinogen, Ur: 0.2 mg/dL (ref 0.2–1.0)
pH, UA: 5.5 (ref 5.0–7.5)

## 2020-01-08 DIAGNOSIS — Z794 Long term (current) use of insulin: Secondary | ICD-10-CM | POA: Diagnosis not present

## 2020-01-08 DIAGNOSIS — E113293 Type 2 diabetes mellitus with mild nonproliferative diabetic retinopathy without macular edema, bilateral: Secondary | ICD-10-CM | POA: Diagnosis not present

## 2020-01-14 ENCOUNTER — Other Ambulatory Visit: Payer: Self-pay

## 2020-01-14 ENCOUNTER — Ambulatory Visit (INDEPENDENT_AMBULATORY_CARE_PROVIDER_SITE_OTHER): Payer: Medicare HMO | Admitting: Urology

## 2020-01-14 ENCOUNTER — Encounter: Payer: Self-pay | Admitting: Urology

## 2020-01-14 VITALS — BP 143/78 | HR 75 | Ht 75.0 in | Wt 327.0 lb

## 2020-01-14 DIAGNOSIS — N029 Recurrent and persistent hematuria with unspecified morphologic changes: Secondary | ICD-10-CM | POA: Diagnosis not present

## 2020-01-14 DIAGNOSIS — R310 Gross hematuria: Secondary | ICD-10-CM | POA: Diagnosis not present

## 2020-01-14 NOTE — Progress Notes (Signed)
   01/14/20  CC:  Chief Complaint  Patient presents with  . Cysto   Indications: Gross hematuria, see note 12/29/2019.    HPI: Denies recurrent hematuria  Blood pressure (!) 143/78, pulse 75, height 6\' 3"  (1.905 m), weight (!) 327 lb (148.3 kg). NED. A&Ox3.   No respiratory distress   Abd soft, NT, ND Normal phallus with bilateral descended testicles  Cystoscopy Procedure Note  Patient identification was confirmed, informed consent was obtained, and patient was prepped using Betadine solution.  Lidocaine jelly was administered per urethral meatus.     Pre-Procedure: - Inspection reveals a normal caliber urethral meatus.  Procedure: The flexible cystoscope was introduced without difficulty - No urethral strictures/lesions are present. 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

## 2020-01-15 LAB — URINALYSIS, COMPLETE
Bilirubin, UA: NEGATIVE
Glucose, UA: NEGATIVE
Ketones, UA: NEGATIVE
Leukocytes,UA: NEGATIVE
Nitrite, UA: NEGATIVE
Protein,UA: NEGATIVE
Specific Gravity, UA: 1.02 (ref 1.005–1.030)
Urobilinogen, Ur: 1 mg/dL (ref 0.2–1.0)
pH, UA: 7 (ref 5.0–7.5)

## 2020-01-15 LAB — MICROSCOPIC EXAMINATION: Bacteria, UA: NONE SEEN

## 2020-01-15 LAB — CYTOLOGY - NON PAP

## 2020-01-16 ENCOUNTER — Telehealth: Payer: Self-pay | Admitting: *Deleted

## 2020-01-16 NOTE — Telephone Encounter (Signed)
-----   Message from Abbie Sons, MD sent at 01/16/2020  6:41 AM EDT ----- Urine cytology showed no abnormal cells

## 2020-01-16 NOTE — Telephone Encounter (Signed)
Notified patient as instructed, patient pleased. Discussed follow-up appointments, patient agrees  

## 2020-01-29 DIAGNOSIS — E119 Type 2 diabetes mellitus without complications: Secondary | ICD-10-CM | POA: Diagnosis not present

## 2020-02-03 ENCOUNTER — Encounter: Payer: Self-pay | Admitting: Nurse Practitioner

## 2020-02-03 ENCOUNTER — Ambulatory Visit (INDEPENDENT_AMBULATORY_CARE_PROVIDER_SITE_OTHER): Payer: Medicare HMO | Admitting: Family Medicine

## 2020-02-03 ENCOUNTER — Other Ambulatory Visit: Payer: Self-pay

## 2020-02-03 VITALS — BP 118/73 | HR 61 | Temp 98.7°F | Wt 326.0 lb

## 2020-02-03 DIAGNOSIS — E785 Hyperlipidemia, unspecified: Secondary | ICD-10-CM | POA: Diagnosis not present

## 2020-02-03 DIAGNOSIS — I10 Essential (primary) hypertension: Secondary | ICD-10-CM

## 2020-02-03 DIAGNOSIS — R310 Gross hematuria: Secondary | ICD-10-CM

## 2020-02-03 DIAGNOSIS — Z23 Encounter for immunization: Secondary | ICD-10-CM

## 2020-02-03 DIAGNOSIS — E1159 Type 2 diabetes mellitus with other circulatory complications: Secondary | ICD-10-CM

## 2020-02-03 DIAGNOSIS — E1169 Type 2 diabetes mellitus with other specified complication: Secondary | ICD-10-CM | POA: Diagnosis not present

## 2020-02-03 LAB — UA/M W/RFLX CULTURE, ROUTINE
Bilirubin, UA: NEGATIVE
Glucose, UA: NEGATIVE
Ketones, UA: NEGATIVE
Leukocytes,UA: NEGATIVE
Nitrite, UA: NEGATIVE
Specific Gravity, UA: 1.02 (ref 1.005–1.030)
Urobilinogen, Ur: 0.2 mg/dL (ref 0.2–1.0)
pH, UA: 6.5 (ref 5.0–7.5)

## 2020-02-03 LAB — MICROSCOPIC EXAMINATION
Bacteria, UA: NONE SEEN
RBC, Urine: >30 /hpf — AB (ref 0–2)

## 2020-02-03 LAB — MICROALBUMIN, URINE WAIVED
Creatinine, Urine Waived: 200 mg/dL (ref 10–300)
Microalb, Ur Waived: 80 mg/L — ABNORMAL HIGH (ref 0–19)

## 2020-02-03 MED ORDER — TAMSULOSIN HCL 0.4 MG PO CAPS
0.4000 mg | ORAL_CAPSULE | Freq: Every day | ORAL | 3 refills | Status: DC
Start: 2020-02-03 — End: 2020-04-06

## 2020-02-03 NOTE — Assessment & Plan Note (Signed)
Under good control on current regimen. Continue current regimen. Continue to monitor. Call with any concerns. Refills up to date. Labs drawn today.  

## 2020-02-03 NOTE — Assessment & Plan Note (Signed)
Doing really well on last check. Continue insulin pump. Continue to follow with endocrine. Call with any concerns.

## 2020-02-03 NOTE — Progress Notes (Signed)
BP 118/73   Pulse 61   Temp 98.7 F (37.1 C) (Oral)   Wt (!) 326 lb (147.9 kg)   SpO2 96%   BMI 40.75 kg/m    Subjective:    Patient ID: Aaron [name], male    DOB: 06-26-1950, 69 y.o.   MRN: [id number]  HPI: [name] is a 69 y.o. male  Chief Complaint  Patient presents with  . Diabetes  . Hypertension  . Hyperlipidemia   DIABETES Hypoglycemic episodes:yes 70 Polydipsia/polyuria: no Visual disturbance: no Chest pain: no Paresthesias: no Glucose Monitoring: yes  Accucheck frequency: TID Taking Insulin?: yes Blood Pressure Monitoring: not checking Retinal Examination: Up to Date Foot Exam: Up to Date Diabetic Education: Completed Pneumovax: Up to Date Influenza: Up to Date Aspirin: yes  HYPERTENSION / HYPERLIPIDEMIA Satisfied with current treatment? yes Duration of hypertension: chronic BP monitoring frequency: not checking BP range:  BP medication side effects: no Past BP meds: amlodipine, HCTZ, carvedilol, losartan Duration of hyperlipidemia: chronic Cholesterol medication side effects: no Cholesterol supplements: none Past cholesterol medications: atorvastatin Medication compliance: excellent compliance Aspirin: no Recent stressors: no Recurrent headaches: no Visual changes: no Palpitations: no Dyspnea: no Chest pain: no Lower extremity edema: no Dizzy/lightheaded: no   URINARY SYMPTOMS- Had blood in his urine this AM. This is the first time he's had that in a while.  Duration: this AM Dysuria: no Urinary frequency: no Urgency: no Small volume voids: no Symptom severity: no Urinary incontinence: no Foul odor: no Hematuria: yes Abdominal pain: no Back pain: no Suprapubic pain/pressure: no Flank pain: no Fever:  no Vomiting: no Status:worse Treatments attempted: increasing fluids    Relevant past medical, surgical, family and social history reviewed and updated as indicated. Interim medical history since our last visit  reviewed. Allergies and medications reviewed and updated.  Review of Systems  Constitutional: Negative.   Respiratory: Negative.   Cardiovascular: Negative.   Gastrointestinal: Negative.   Genitourinary: Positive for hematuria. Negative for decreased urine volume, difficulty urinating, discharge, dysuria, enuresis, flank pain, frequency, genital sores, penile pain, penile swelling, scrotal swelling, testicular pain and urgency.  Musculoskeletal: Negative.   Psychiatric/Behavioral: Negative.     Per HPI unless specifically indicated above     Objective:    BP 118/73   Pulse 61   Temp 98.7 F (37.1 C) (Oral)   Wt (!) 326 lb (147.9 kg)   SpO2 96%   BMI 40.75 kg/m   Wt Readings from Last 3 Encounters:  02/03/20 (!) 326 lb (147.9 kg)  01/14/20 (!) 327 lb (148.3 kg)  12/29/19 (!) 327 lb 8 oz (148.6 kg)    Physical Exam Vitals and nursing note reviewed.  Constitutional:      General: He is not in acute distress.    Appearance: Normal appearance. He is not ill-appearing, toxic-appearing or diaphoretic.  HENT:     Head: Normocephalic and atraumatic.     Right Ear: External ear normal.     Left Ear: External ear normal.     Nose: Nose normal.     Mouth/Throat:     Mouth: Mucous membranes are moist.     Pharynx: Oropharynx is clear.  Eyes:     General: No scleral icterus.       Right eye: No discharge.        Left eye: No discharge.     Extraocular Movements: Extraocular movements intact.     Conjunctiva/sclera: Conjunctivae normal.     Pupils: Pupils are equal,  round, and reactive to light.  Cardiovascular:     Rate and Rhythm: Normal rate and regular rhythm.     Pulses: Normal pulses.     Heart sounds: Normal heart sounds. No murmur heard.  No friction rub. No gallop.   Pulmonary:     Effort: Pulmonary effort is normal. No respiratory distress.     Breath sounds: Normal breath sounds. No stridor. No wheezing, rhonchi or rales.  Chest:     Chest wall: No tenderness.   Musculoskeletal:        General: Normal range of motion.     Cervical back: Normal range of motion and neck supple.  Skin:    General: Skin is warm and dry.     Capillary Refill: Capillary refill takes less than 2 seconds.     Coloration: Skin is not jaundiced or pale.     Findings: No bruising, erythema, lesion or rash.  Neurological:     General: No focal deficit present.     Mental Status: He is alert and oriented to person, place, and time. Mental status is at baseline.  Psychiatric:        Mood and Affect: Mood normal.        Behavior: Behavior normal.        Thought Content: Thought content normal.        Judgment: Judgment normal.     Results for orders placed or performed in visit on 01/14/20  Microscopic Examination   Urine  Result Value Ref Range   WBC, UA 0-5 0 - 5 /hpf   RBC 3-10 (A) 0 - 2 /hpf   Epithelial Cells (non renal) 0-10 0 - 10 /hpf   Casts Present (A) None seen /lpf   Cast Type Hyaline casts N/A   Bacteria, UA None seen None seen/Few  Urinalysis, Complete  Result Value Ref Range   Specific Gravity, UA 1.020 1.005 - 1.030   pH, UA 7.0 5.0 - 7.5   Color, UA Yellow Yellow   Appearance Ur Hazy (A) Clear   Leukocytes,UA Negative Negative   Protein,UA Negative Negative/Trace   Glucose, UA Negative Negative   Ketones, UA Negative Negative   RBC, UA 1+ (A) Negative   Bilirubin, UA Negative Negative   Urobilinogen, Ur 1.0 0.2 - 1.0 mg/dL   Nitrite, UA Negative Negative   Microscopic Examination See below:   Cytology - Non PAP;  Result Value Ref Range   CYTOLOGY - NON GYN      Cytology - Non PAP CASE: [id number] PATIENT: [name] Non-Gynecological Cytology Report     Specimen Submitted: A. Urine  Clinical History: Hematuria. Personal history of prostate cancer post radiation.     DIAGNOSIS: A. URINE; VOIDED: - NEGATIVE FOR HIGH-GRADE UROTHELIAL CARCINOMA. - ADEQUATE VOLUME AND CELLULARITY FOR EVALUATION.  GROSS  DESCRIPTION: A. Labeled: Received labeled with the patient's name and date of birth (per requisition urine, voided) Received: Fresh Volume: 50 mL Description of fluid and container in which it is received: Received in a clear plastic specimen container with a yellow screw top lid is yellow clear fluid. Specimen material submitted for: ThinPrep  Final Diagnosis performed by Bryan Lemma, MD.   Electronically signed 01/15/2020 6:46:22PM The electronic signature indicates that the named Attending Pathologist has evaluated the specimen Technical component performed at [facility], [address], [place], [place] Lab: [phone] Dir: Rush Farmer, MD, MMM  Professional component performed at [facility], [facility], [place],  [place] [address] Lab: [phone] Dir: Dellia Nims. Rubinas, MD       Assessment & Plan:   Problem List Items Addressed This Visit      Cardiovascular and Mediastinum   Hypertension associated with diabetes ([place])    Under good control on current regimen. Continue current regimen. Continue to monitor. Call with any concerns. Refills up to date. Labs drawn today.         Endocrine   Diabetes mellitus associated with hormonal etiology ([place]) - Primary    Doing really well on last check. Continue insulin pump. Continue to follow with endocrine. Call with any concerns.       Relevant Orders   Microalbumin, Urine Waived   Basic metabolic panel   Hyperlipidemia associated with type 2 diabetes mellitus ([place])    Under good control on current regimen. Continue current regimen. Continue to monitor. Call with any concerns. Refills up to date. Labs drawn today.         Other Visit Diagnoses    Gross hematuria       No sign of UTI. History of kidney stone. Will treat with flomax. If not better by Friday- let us know and we will image.    Relevant Orders   UA/M w/rflx Culture, Routine   Flu vaccine need       Flu shot given today    Relevant Orders   Flu Vaccine QUAD High Dose(Fluad) (Completed)       Follow up plan: Return in about 3 months (around 05/04/2020).

## 2020-02-04 LAB — BASIC METABOLIC PANEL
BUN/Creatinine Ratio: 14 (ref 10–24)
BUN: 18 mg/dL (ref 8–27)
CO2: 28 mmol/L (ref 20–29)
Calcium: 9.4 mg/dL (ref 8.6–10.2)
Chloride: 102 mmol/L (ref 96–106)
Creatinine, Ser: 1.26 mg/dL (ref 0.76–1.27)
GFR calc Af Amer: 68 mL/min/{1.73_m2} (ref >59)
GFR calc non Af Amer: 59 mL/min/{1.73_m2} — ABNORMAL LOW (ref >59)
Glucose: 76 mg/dL (ref 65–99)
Potassium: 4.8 mmol/L (ref 3.5–5.2)
Sodium: 144 mmol/L (ref 134–144)

## 2020-02-06 DIAGNOSIS — G4733 Obstructive sleep apnea (adult) (pediatric): Secondary | ICD-10-CM | POA: Diagnosis not present

## 2020-02-18 ENCOUNTER — Ambulatory Visit: Payer: Self-pay | Admitting: Pharmacist

## 2020-02-18 DIAGNOSIS — E1159 Type 2 diabetes mellitus with other circulatory complications: Secondary | ICD-10-CM

## 2020-02-18 DIAGNOSIS — E1169 Type 2 diabetes mellitus with other specified complication: Secondary | ICD-10-CM

## 2020-02-22 NOTE — Patient Instructions (Addendum)
Visit Information:  It was a pleasure speaking with you today. Please pick up a BP cuff at your local pharmacy and begin checking at home. We recommend arm cuffs over wrist cuffs, wrist cuffs can be less accurate. Check your blood pressure ~3-4 times weekly. Make sure you are sitting down for at least 5 minutes before, resting calmly, with your feet flat on the floor. Write down these readings to review at future appointments.Thank you for letting me be part of your clinical team. Please call with any questions or concerns.   Almyra Free  Goals Addressed              This Visit's Progress   .  "I want to stay healthy" (pt-stated)        CARE PLAN ENTRY (see longtitudinal plan of care for additional care plan information)  Current Barriers:  . Diabetes: controlled; most recent A1c 6.3%; follows w/ Dr. Gabriel Carina  o Attended 7 PT sessions for neck pain but did not feel any benefit. Notes he has f/u with ortho in November.  o Microscopic hematuria. Patient saw Urology 12/29/19- UA with microscopic blood, small renal calculi. Cystoscopy 01/03/20 cytology yields no abnormal cells. 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  Follow up in 6 months . Current antihyperglycemic regimen: Novolog via pump o Receiving Novolog vial assistance through [name] through 05/21/20.  Picked up refill 02/13/20. Last Endo appt. 12/26/19 FBG 87, C peptide 0.7. No changes to pump. Pump is out of warranty exploring new options with Endo. Checking BG QID  114 this am, 160 yesterday. Reports low [id number]  mid-afternoon 8/06. He had skipped lunch. . Cardiovascular risk reduction: o Current hypertensive regimen: amlodipine 10 mg daily, carvedilol 25 mg BID, losartan 100 mg daily, HCTZ 25 mg daily. Has not been checking at home. Not yet gotten a BP cuff. States he will prioritize. BP elevated at Urology but controlled at PCP 02/03/20. o Current hyperlipidemia regimen: atorvastatin 20 mg daily; LDL at goal <70 o Current antiplatelet regimen: ASA  81 mg daily  . Gout: allopurinol 300 mg daily; uric acid at goal ~6. No recent gout flares   Pharmacist Clinical Goal(s):  .Marland KitchenOver the next 90 days, patient with work with PharmD and primary care provider to address maintained glycemic control  Interventions: . Comprehensive medication review performed, medication list updated in electronic medical record . Inter-disciplinary care team collaboration (see longitudinal plan of care) . Praised for continued improvement in A1c. Reviewed to contact endo if hypoglycemia develops for pump adjustments.  . Encouraged to go ahead and call Dr. SJoycie Peekoffice to ask them to place a refill request for Novolog from [facility] as Novo is running a few weeks behind in shipping. He verbalizes understanding . Reiterated importance of home BP monitoring, given multiple BP medications. Fill history is up to date. Patient confirms he will pursue getting a BP machine . Praised for maintenance of goal lipid levels . Encouraged to call urology ASAP to schedule an appointment for appropriate work up for hematuria. He verbalized understanding.   Patient Self Care Activities:  . Patient will obtain BP cuff and begin checking BP daily, document and provide at future appointments. . Patient will check blood glucose regularly, document, and provide at future appointments . Patient will take medications as prescribed . Patient will report any questions or concerns to provider   Please see past updates related to this goal by clicking on the "Past Updates" button in the selected goal  The patient verbalized understanding of instructions provided today and agreed to receive a mailed copy of patient instruction and/or educational materials.  Telephone follow up appointment with pharmacy team member scheduled for: 05/24/20 at [place]. [place] PharmD, BCPS Clinical Pharmacist [phone]  Preventing Hypoglycemia Hypoglycemia occurs when the  level of sugar (glucose) in the blood is too low. Hypoglycemia can happen in people who do or do not have diabetes (diabetes mellitus). It can develop quickly, and it can be a medical emergency. For most people with diabetes, a blood glucose level below 70 mg/dL (3.9 mmol/L) is considered hypoglycemia. Glucose is a type of sugar that provides the body's main source of energy. Certain hormones (insulin and glucagon) control the level of glucose in the blood. Insulin lowers blood glucose, and glucagon increases blood glucose. Hypoglycemia can result from having too much insulin in the bloodstream, or from not eating enough food that contains glucose. Your risk for hypoglycemia is higher:  If you take insulin or diabetes medicines to help lower your blood glucose or help your body make more insulin.  If you skip or delay a meal or snack.  If you are ill.  During and after exercise. You can prevent hypoglycemia by working with your health care provider to adjust your meal plan as needed and by taking other precautions. How can hypoglycemia affect me? Mild symptoms Mild hypoglycemia may not cause any symptoms. If you do have symptoms, they may include:  Hunger.  Anxiety.  Sweating and feeling clammy.  Dizziness or feeling light-headed.  Sleepiness.  Nausea.  Increased heart rate.  Headache.  Blurry vision.  Irritability.  Tingling or numbness around the mouth, lips, or tongue.  A change in coordination.  Restless sleep. If mild hypoglycemia is not recognized and treated, it can quickly become moderate or severe hypoglycemia. Moderate symptoms Moderate hypoglycemia can cause:  Mental confusion and poor judgment.  Behavior changes.  Weakness.  Irregular heartbeat. Severe symptoms Severe hypoglycemia is a medical emergency. It can cause:  Fainting.  Seizures.  Loss of consciousness (coma).  Death. What nutrition changes can be made?  Work with your health care  provider or diet and nutrition specialist (dietitian) to make a healthy meal plan that is right for you. Follow your meal plan carefully.  Eat meals at regular times.  If recommended by your health care provider, have snacks between meals.  Donot skip or delay meals or snacks. You can be at risk for hypoglycemia if you are not getting enough carbohydrates. What lifestyle changes can be made?   Work closely with your health care provider to manage your blood glucose. Make sure you know: ? Your goal blood glucose levels. ? How and when to check your blood glucose. ? The symptoms of hypoglycemia. It is important to treat it right away to keep it from becoming severe.  Do not drink alcohol on an empty stomach.  When you are ill, check your blood glucose more often than usual. Follow your sick day plan whenever you cannot eat or drink normally. Make this plan in advance with your health care provider.  Always check your blood glucose before, during, and after exercise. How is this treated? This condition can often be treated by immediately eating or drinking something that contains sugar, such as:  Fruit juice, 4-6 oz (120-150 mL).  Regular (not diet) soda, 4-6 oz (120-150 mL).  Low-fat milk, 4 oz (120 mL).  Several pieces of hard candy.  Sugar or honey,  1 Tbsp (15 mL). Treating hypoglycemia if you have diabetes If you are alert and able to swallow safely, follow the 15:15 rule:  Take 15 grams of a rapid-acting carbohydrate. Talk with your health care provider about how much you should take.  Rapid-acting options include: ? Glucose pills (take 15 grams). ? 6-8 pieces of hard candy. ? 4-6 oz (120-150 mL) of fruit juice. ? 4-6 oz (120-150 mL) of regular (not diet) soda.  Check your blood glucose 15 minutes after you take the carbohydrate.  If the repeat blood glucose level is still at or below 70 mg/dL (3.9 mmol/L), take 15 grams of a carbohydrate again.  If your blood glucose  level does not increase above 70 mg/dL (3.9 mmol/L) after 3 tries, seek emergency medical care.  After your blood glucose level returns to normal, eat a meal or a snack within 1 hour. Treating severe hypoglycemia Severe hypoglycemia is when your blood glucose level is at or below 54 mg/dL (3 mmol/L). Severe hypoglycemia is a medical emergency. Get medical help right away. If you have severe hypoglycemia and you cannot eat or drink, you may need an injection of glucagon. A family member or close friend should learn how to check your blood glucose and how to give you a glucagon injection. Ask your health care provider if you need to have an emergency glucagon injection kit available. Severe hypoglycemia may need to be treated in a hospital. The treatment may include getting glucose through an IV. You may also need treatment for the cause of your hypoglycemia. Where to find more information  American Diabetes Association: www.diabetes.[employer] of Diabetes and Digestive and Kidney Diseases: [employer] Contact a health care provider if:  You have problems keeping your blood glucose in your target range.  You have frequent episodes of hypoglycemia. Get help right away if:  You continue to have hypoglycemia symptoms after eating or drinking something containing glucose.  Your blood glucose level is at or below 54 mg/dL (3 mmol/L).  You faint.  You have a seizure. These symptoms may represent a serious problem that is an emergency. Do not wait to see if the symptoms will go away. Get medical help right away. Call your local emergency services (911 in the [place] Summary  Know the symptoms of hypoglycemia, and when you are at risk for it (such as during exercise or when you are sick). Check your blood glucose often when you are at risk for hypoglycemia.  Hypoglycemia can develop quickly, and it can be dangerous if it is not treated right away. If you have a history of  severe hypoglycemia, make sure you know how to use your glucagon injection kit.  Make sure you know how to treat hypoglycemia. Keep a carbohydrate snack available when you may be at risk for hypoglycemia. This information is not intended to replace advice given to you by your health care provider. Make sure you discuss any questions you have with your health care provider. Document Revised: 08/30/2018 Document Reviewed: 01/03/2017 Elsevier Patient Education  [place].

## 2020-02-22 NOTE — Chronic Care Management (AMB) (Signed)
Chronic Care Management Pharmacy  Name: Aaron [name]  MRN: [id number] DOB: November 18, 1950   Chief Complaint/ HPI  Aaron [name],  69 y.o. , male presents for their Follow-Up CCM visit with the clinical pharmacist via telephone due to COVID-19 Pandemic.  PCP : Venita Lick, NP Patient Care Team: Venita Lick, NP as PCP - General (Nurse Practitioner) Judi Cong, MD as Consulting Physician (Endocrinology) Vladimir Faster, [facility] as Pharmacist (Pharmacist)  Their chronic conditions include: Hypertension, Hyperlipidemia, Diabetes, Gout and H/O prostate cancer   Office Visits: 02/03/20-Dr Wynetta Emery- bloodwork, flu shot, Flomax  Consult Visit 01/08/20- Dr. Bernardo Heater, Urololgy- Cystoscopy- no abnormal cytology 12/29/19 - Dr. Bernardo Heater, Urology- CT, schedule cystoscopy 12/26/19- Dr. Gabriel Carina, Endocrinology- no change to pump, labs   Allergies  Allergen Reactions  . Glucophage [Metformin] Diarrhea    Medications: Outpatient Encounter Medications as of 02/18/2020  Medication Sig Note  . Accu-Chek FastClix Lancets MISC 5 (five) times daily   . Accu-Chek FastClix Lancets MISC    . ACCU-CHEK GUIDE test strip    . acetaminophen (TYLENOL) 500 MG tablet Take 1-2 tablets (500-1,000 mg total) by mouth every 6 (six) hours as needed for mild pain (pain score 1-3 or temp > 100.5).   Marland Kitchen allopurinol (ZYLOPRIM) 300 MG tablet Take 1 tablet (300 mg total) by mouth daily.   Marland Kitchen amLODipine (NORVASC) 10 MG tablet Take 1 tablet (10 mg total) by mouth daily. 11/11/2019: QPM  . ASPIRIN 81 PO every evening.    Marland Kitchen atorvastatin (LIPITOR) 20 MG tablet Take 1 tablet (20 mg total) by mouth daily.   . carvedilol (COREG) 25 MG tablet Take 1 tablet (25 mg total) by mouth 2 (two) times daily with a meal.   . hydrochlorothiazide (HYDRODIURIL) 25 MG tablet Take 1 tablet (25 mg total) by mouth daily.   . insulin aspart (NOVOLOG) 100 UNIT/ML injection Inject into the skin. Using via insulin pump  Medtronic MiniMed 630G  insulin pump   . Insulin Human (INSULIN PUMP) SOLN Inject into the skin.   Marland Kitchen losartan (COZAAR) 100 MG tablet Take 1 tablet (100 mg total) by mouth daily. 11/11/2019: QPM  . tamsulosin (FLOMAX) 0.4 MG CAPS capsule Take 1 capsule (0.4 mg total) by mouth daily.    No facility-administered encounter medications on file as of 02/18/2020.    Wt Readings from Last 3 Encounters:  02/03/20 (!) 326 lb (147.9 kg)  01/14/20 (!) 327 lb (148.3 kg)  12/29/19 (!) 327 lb 8 oz (148.6 kg)    Current Diagnosis/Assessment:    Goals Addressed              This Visit's Progress   .  "I want to stay healthy" (pt-stated)        CARE PLAN ENTRY (see longtitudinal plan of care for additional care plan information)  Current Barriers:  . Diabetes: controlled; most recent A1c 6.3%; follows w/ Dr. Gabriel Carina  o Attended 7 PT sessions for neck pain but did not feel any benefit. Notes he has f/u with ortho in November.  o Microscopic hematuria. Patient saw Urology 12/29/19- UA with microscopic blood, small renal calculi. Cystoscopy 01/03/20 cytology yields no abnormal cells. 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  Follow up in 6 months . Current antihyperglycemic regimen: Novolog via pump o Receiving Novolog vial assistance through [name] through 05/21/20.  Picked up refill 02/13/20. Last Endo appt. 12/26/19 FBG 87, C peptide 0.7. No changes to pump. Pump is out of warranty exploring new options with  Endo. Checking BG QID  114 this am, 160 yesterday. Reports low reading35f [id number]  mid-afternoon 8/06. He had skipped lunch. . Cardiovascular risk reduction: o Current hypertensive regimen: amlodipine 10 mg daily, carvedilol 25 mg BID, losartan 100 mg daily, HCTZ 25 mg daily. Has not been checking at home. Not yet gotten a BP cuff. States he will prioritize. BP elevated at Urology but controlled at PCP 02/03/20. o Current hyperlipidemia regimen: atorvastatin 20 mg daily; LDL at goal <70 o Current antiplatelet regimen: ASA 81 mg daily   . Gout: allopurinol 300 mg daily; uric acid at goal ~6. No recent gout flares   Pharmacist Clinical Goal(s):  Marland Kitchen Over the next 90 days, patient with work with PharmD and primary care provider to address maintained glycemic control  Interventions: . Comprehensive medication review performed, medication list updated in electronic medical record . Inter-disciplinary care team collaboration (see longitudinal plan of care) . Praised for continued improvement in A1c. Reviewed to contact endo if hypoglycemia develops for pump adjustments.  . Encouraged to go ahead and call Dr. Joycie Peek office to ask them to place a refill request for Novolog from [employer], as Novo is running a few weeks behind in shipping. He verbalizes understanding . Reiterated importance of home BP monitoring, given multiple BP medications. Fill history is up to date. Patient confirms he will pursue getting a BP machine . Praised for maintenance of goal lipid levels . Encouraged to call urology ASAP to schedule an appointment for appropriate work up for hematuria. He verbalized understanding.   Patient Self Care Activities:  . Patient will obtain BP cuff and begin checking BP daily, document and provide at future appointments. . Patient will check blood glucose regularly, document, and provide at future appointments . Patient will take medications as prescribed . Patient will report any questions or concerns to provider   Please see past updates related to this goal by clicking on the "Past Updates" button in the selected goal         Diabetes   Recent Relevant Labs: Lab Results  Component Value Date/Time   HGBA1C 6.3 10/31/2019 08:53 AM   HGBA1C 6.9 08/19/2019 12:00 AM   HGBA1C 7.6 08/15/2017 12:00 AM   MICROALBUR 80 (H) 02/03/2020 10:16 AM   MICROALBUR 30 (H) 03/21/2018 11:03 AM     Checking BG: four times daily  Recent FBG Readings: 114. 160  Patient is currently controlled on the following  medications:  Novolog via insulin pump  Last diabetic Foot exam:  Lab Results  Component Value Date/Time   HMDIABEYEEXA Retinopathy (A) 09/10/2019 12:00 AM    Last diabetic Foot  exam: No results found for: HMDIABFOOTEX   We discussed: diet and exercise extensively and how to recognize and treat signs of hypoglycemia. Patient followed by endocrinology.  Plan  Continue current medications  Hypertension   BP goal is:  <130/80  Office blood pressures are  BP Readings from Last 3 Encounters:  02/03/20 118/73  01/14/20 (!) 143/78  12/29/19 (!) 176/80    Patient has failed these meds in the past: Unavailable from chart  Patient checks BP at home Does not check. Needs BP cuff.   We discussed recent elevated BP recorded at Urology appointments. Importance of checking BP at home given elevations and  Numerous medications required for control. Patient will obtain BP cuff and begin checking at home.  Plan  Continue current medications       Medication Management   Pt uses [employer]  mail order and [place] for all medications   Plan  Continue current medication management strategy    Follow up: 3  month phone visit  Junita Push. Kenton Kingfisher PharmD, [facility] [phone]

## 2020-03-03 DIAGNOSIS — Z7984 Long term (current) use of oral hypoglycemic drugs: Secondary | ICD-10-CM | POA: Diagnosis not present

## 2020-03-03 DIAGNOSIS — H2513 Age-related nuclear cataract, bilateral: Secondary | ICD-10-CM | POA: Diagnosis not present

## 2020-03-03 DIAGNOSIS — H5203 Hypermetropia, bilateral: Secondary | ICD-10-CM | POA: Diagnosis not present

## 2020-03-03 DIAGNOSIS — H52223 Regular astigmatism, bilateral: Secondary | ICD-10-CM | POA: Diagnosis not present

## 2020-03-03 DIAGNOSIS — E119 Type 2 diabetes mellitus without complications: Secondary | ICD-10-CM | POA: Diagnosis not present

## 2020-03-03 DIAGNOSIS — H40013 Open angle with borderline findings, low risk, bilateral: Secondary | ICD-10-CM | POA: Diagnosis not present

## 2020-03-03 DIAGNOSIS — H524 Presbyopia: Secondary | ICD-10-CM | POA: Diagnosis not present

## 2020-03-03 LAB — HM DIABETES EYE EXAM

## 2020-03-04 ENCOUNTER — Telehealth: Payer: Self-pay | Admitting: Pharmacist

## 2020-03-04 NOTE — Chronic Care Management (AMB) (Signed)
Chronic Care Management Pharmacy Assistant   Name: Aaron [name]  MRN: [id number] DOB: 01-22-1951  Reason for Encounter: Disease State  Patient Questions:  1.  Have you seen any other providers since your last visit? Yes, patient states he was seen by his Optometrist (Dr.Nice) for his 6 month follow up.  2.  Any changes in your medicines or health? No   Reviewed chart prior to disease state call. Spoke with patient regarding BP  Recent Office Vitals: BP Readings from Last 3 Encounters:  02/03/20 118/73  01/14/20 (!) 143/78  12/29/19 (!) 176/80   Pulse Readings from Last 3 Encounters:  02/03/20 61  01/14/20 75  12/29/19 76    Wt Readings from Last 3 Encounters:  02/03/20 (!) 326 lb (147.9 kg)  01/14/20 (!) 327 lb (148.3 kg)  12/29/19 (!) 327 lb 8 oz (148.6 kg)     Kidney Function Lab Results  Component Value Date/Time   CREATININE 1.26 02/03/2020 10:19 AM   CREATININE 1.22 10/31/2019 08:59 AM   CREATININE 1.49 (H) 10/26/2012 07:29 PM   GFRNONAA 59 (L) 02/03/2020 10:19 AM   GFRNONAA 50 (L) 10/26/2012 07:29 PM   GFRAA 68 02/03/2020 10:19 AM   GFRAA 58 (L) 10/26/2012 07:29 PM    BMP Latest Ref Rng & Units 02/03/2020 10/31/2019 09/16/2019  Glucose 65 - 99 mg/dL 76 96 -  BUN 8 - 27 mg/dL 18 20 -  Creatinine 0.76 - 1.27 mg/dL 1.26 1.22 1.60(H)  BUN/Creat Ratio 10 - 24 14 16  -  Sodium 134 - 144 mmol/L 144 143 -  Potassium 3.5 - 5.2 mmol/L 4.8 4.0 -  Chloride 96 - 106 mmol/L 102 104 -  CO2 20 - 29 mmol/L 28 25 -  Calcium 8.6 - 10.2 mg/dL 9.4 9.2 -     Current antihypertensive regimen:  amlodipine 10 mg daily, carvedilol 25 mg BID, losartan 100 mg daily, HCTZ 25 mg daily.   How often are you checking your Blood Pressure? Patient states he is not checking his blood pressure daily as of yet however he just bought a BP monitor today 03/04/20 but it is still in the box and hasn't been set up yet.    Current home BP readings: None ID.   What recent  interventions/DTPs have been made by any provider to improve Blood Pressure control since last CPP Visit: 02/18/20-Televisit-Julie Harris, CPP- Reiterated importance of home BP monitoring, given multiple BP medications. Fill history is up to date. Patient confirms he will pursue getting a BP machine.   Any recent hospitalizations or ED visits since last visit with CPP? No    What diet changes have been made to improve Blood Pressure Control?  o Patient states he normally eats twice a day; breakfast and dinner. For breakfast; he eats an egg & cheese sandwich or peanut butter sandwich. For dinner; he eats spaghetti, chicken that is cooked in a air fryer, corn, green beans, sometimes potatoes but tries to stay away from it.  What exercise is being done to improve your Blood Pressure Control?  o Patient states he mows the yard or sit in his recliner and watch tv.   Adherence Review: Is the patient currently on ACE/ARB medication? No Does the patient have >5 day gap between last estimated fill dates? No   Candice Arvid Right  PCP : Venita Lick, NP  Allergies:   Allergies  Allergen Reactions   Glucophage [Metformin] Diarrhea    Medications: Outpatient Encounter Medications  as of 03/04/2020  Medication Sig Note   Accu-Chek FastClix Lancets MISC 5 (five) times daily    Accu-Chek FastClix Lancets MISC     ACCU-CHEK GUIDE test strip     acetaminophen (TYLENOL) 500 MG tablet Take 1-2 tablets (500-1,000 mg total) by mouth every 6 (six) hours as needed for mild pain (pain score 1-3 or temp > 100.5).    allopurinol (ZYLOPRIM) 300 MG tablet Take 1 tablet (300 mg total) by mouth daily.    amLODipine (NORVASC) 10 MG tablet Take 1 tablet (10 mg total) by mouth daily. 11/11/2019: QPM   ASPIRIN 81 PO every evening.     atorvastatin (LIPITOR) 20 MG tablet Take 1 tablet (20 mg total) by mouth daily.    carvedilol (COREG) 25 MG tablet Take 1 tablet (25 mg total) by mouth 2 (two) times daily  with a meal.    hydrochlorothiazide (HYDRODIURIL) 25 MG tablet Take 1 tablet (25 mg total) by mouth daily.    insulin aspart (NOVOLOG) 100 UNIT/ML injection Inject into the skin. Using via insulin pump  Medtronic MiniMed 630G insulin pump    Insulin Human (INSULIN PUMP) SOLN Inject into the skin.    losartan (COZAAR) 100 MG tablet Take 1 tablet (100 mg total) by mouth daily. 11/11/2019: QPM   tamsulosin (FLOMAX) 0.4 MG CAPS capsule Take 1 capsule (0.4 mg total) by mouth daily.    No facility-administered encounter medications on file as of 03/04/2020.    Current Diagnosis: Patient Active Problem List   Diagnosis Date Noted   Asymptomatic microscopic hematuria 09/08/2019   Skin lesion 07/02/2019   Diabetes mellitus associated with hormonal etiology ([place]) 01/18/2018   Morbid obesity ([place]) 01/18/2018   Presence of insulin pump 01/18/2018   Status post total hip replacement, right 11/27/2017   Personal history of prostate cancer 11/14/2017   Advanced care planning/counseling discussion 08/15/2017   Degenerative arthritis of cervical spine 02/15/2017   Sleep apnea 04/26/2015   Gout 04/26/2015   History of prostate cancer 04/26/2015   S/P hip replacement 04/26/2015   BMI 40.0-44.9, adult ([place]) 04/26/2015   Hyperlipidemia associated with type 2 diabetes mellitus ([place])    Hypertension associated with diabetes ([place])     Goals Addressed              This Visit's Progress     "I want to stay healthy" (pt-stated)   Not on track     [place] (see longtitudinal plan of care for additional care plan information)  Current Barriers:   Diabetes: controlled; most recent A1c 6.3%; follows w/ Dr. Gabriel Carina  o Attended 7 PT sessions for neck pain but did not feel any benefit. Notes he has f/u with ortho in November.  o Microscopic hematuria. Patient saw Urology 12/29/19- UA with microscopic blood, small renal calculi. Cystoscopy 01/03/20 cytology yields no abnormal cells.  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  Follow up in 6 months  Current antihyperglycemic regimen: Novolog via pump o Receiving Novolog vial assistance through [name] through 05/21/20.  Picked up refill 02/13/20. Last Endo appt. 12/26/19 FBG 87, C peptide 0.7. No changes to pump. Pump is out of warranty exploring new options with Endo. Checking BG QID  114 this am, 160 yesterday. Reports low reading52f 77  mid-afternoon 8/06. He had skipped lunch.  Cardiovascular risk reduction: o Current hypertensive regimen: amlodipine 10 mg daily, carvedilol 25 mg BID, losartan 100 mg daily, HCTZ 25 mg daily. Has not been checking at home. Not yet gotten a  BP cuff. States he will prioritize. BP elevated at Urology but controlled at PCP 02/03/20. o Current hyperlipidemia regimen: atorvastatin 20 mg daily; LDL at goal <70 o Current antiplatelet regimen: ASA 81 mg daily   Gout: allopurinol 300 mg daily; uric acid at goal ~6. No recent gout flares   Pharmacist Clinical Goal(s):   Over the next 90 days, patient with work with PharmD and primary care provider to address maintained glycemic control  Interventions:  Comprehensive medication review performed, medication list updated in electronic medical record  Inter-disciplinary care team collaboration (see longitudinal plan of care)  Praised for continued improvement in A1c. Reviewed to contact endo if hypoglycemia develops for pump adjustments.   Encouraged to go ahead and call Dr. Joycie Peek office to ask them to place a refill request for Novolog from [employer], as Novo is running a few weeks behind in shipping. He verbalizes understanding  Reiterated importance of home BP monitoring, given multiple BP medications. Fill history is up to date. Patient confirms he will pursue getting a BP machine  Praised for maintenance of goal lipid levels  Encouraged to call urology ASAP to schedule an appointment for appropriate work up for hematuria. He verbalized  understanding.   Patient Self Care Activities:   Patient will obtain BP cuff and begin checking BP daily, document and provide at future appointments.  Patient will check blood glucose regularly, document, and provide at future appointments  Patient will take medications as prescribed  Patient will report any questions or concerns to provider   Please see past updates related to this goal by clicking on the "Past Updates" button in the selected goal         Follow-Up:  Pharmacist Review-Patient states he is not checking his blood pressure daily as of yet however he just bought a BP monitor today 03/04/20 but it is still in the box and hasn't been set up yet. Patient voiced he does not have any issues or complaints with his hypertension since his last visit on 02/18/20 with Birdena Crandall, CPP at this time.  Raynelle Highland, [place] Assistant [phone]

## 2020-04-06 ENCOUNTER — Other Ambulatory Visit: Payer: Self-pay

## 2020-04-06 MED ORDER — TAMSULOSIN HCL 0.4 MG PO CAPS: 0.4000 mg | ORAL_CAPSULE | Freq: Every day | ORAL | 4 refills | Status: DC

## 2020-04-06 NOTE — Telephone Encounter (Signed)
Asking for refill of Tamsulosin 0.4 mg, patients last ov was 02/03/20, upcoming appt 05/04/20. Order pended

## 2020-04-13 DIAGNOSIS — M542 Cervicalgia: Secondary | ICD-10-CM | POA: Diagnosis not present

## 2020-04-13 DIAGNOSIS — Z794 Long term (current) use of insulin: Secondary | ICD-10-CM | POA: Diagnosis not present

## 2020-04-13 DIAGNOSIS — E113293 Type 2 diabetes mellitus with mild nonproliferative diabetic retinopathy without macular edema, bilateral: Secondary | ICD-10-CM | POA: Diagnosis not present

## 2020-04-19 DIAGNOSIS — E119 Type 2 diabetes mellitus without complications: Secondary | ICD-10-CM | POA: Diagnosis not present

## 2020-04-20 DIAGNOSIS — E113293 Type 2 diabetes mellitus with mild nonproliferative diabetic retinopathy without macular edema, bilateral: Secondary | ICD-10-CM | POA: Diagnosis not present

## 2020-04-20 DIAGNOSIS — Z794 Long term (current) use of insulin: Secondary | ICD-10-CM | POA: Diagnosis not present

## 2020-04-20 DIAGNOSIS — Z9641 Presence of insulin pump (external) (internal): Secondary | ICD-10-CM | POA: Diagnosis not present

## 2020-04-20 DIAGNOSIS — E1169 Type 2 diabetes mellitus with other specified complication: Secondary | ICD-10-CM | POA: Diagnosis not present

## 2020-04-20 DIAGNOSIS — E669 Obesity, unspecified: Secondary | ICD-10-CM | POA: Diagnosis not present

## 2020-04-22 DIAGNOSIS — E1165 Type 2 diabetes mellitus with hyperglycemia: Secondary | ICD-10-CM | POA: Diagnosis not present

## 2020-05-01 ENCOUNTER — Encounter: Payer: Self-pay | Admitting: Nurse Practitioner

## 2020-05-01 DIAGNOSIS — E113293 Type 2 diabetes mellitus with mild nonproliferative diabetic retinopathy without macular edema, bilateral: Secondary | ICD-10-CM | POA: Insufficient documentation

## 2020-05-01 DIAGNOSIS — I700 Atherosclerosis of aorta: Secondary | ICD-10-CM | POA: Insufficient documentation

## 2020-05-04 ENCOUNTER — Encounter: Payer: Self-pay | Admitting: Nurse Practitioner

## 2020-05-04 ENCOUNTER — Ambulatory Visit (INDEPENDENT_AMBULATORY_CARE_PROVIDER_SITE_OTHER): Payer: Medicare HMO | Admitting: Nurse Practitioner

## 2020-05-04 ENCOUNTER — Other Ambulatory Visit: Payer: Self-pay

## 2020-05-04 VITALS — BP 128/82 | HR 73 | Temp 97.9°F | Ht 72.84 in | Wt 328.6 lb

## 2020-05-04 DIAGNOSIS — Z794 Long term (current) use of insulin: Secondary | ICD-10-CM

## 2020-05-04 DIAGNOSIS — E113293 Type 2 diabetes mellitus with mild nonproliferative diabetic retinopathy without macular edema, bilateral: Secondary | ICD-10-CM | POA: Diagnosis not present

## 2020-05-04 DIAGNOSIS — G4733 Obstructive sleep apnea (adult) (pediatric): Secondary | ICD-10-CM | POA: Diagnosis not present

## 2020-05-04 DIAGNOSIS — I152 Hypertension secondary to endocrine disorders: Secondary | ICD-10-CM

## 2020-05-04 DIAGNOSIS — I700 Atherosclerosis of aorta: Secondary | ICD-10-CM | POA: Diagnosis not present

## 2020-05-04 DIAGNOSIS — E1159 Type 2 diabetes mellitus with other circulatory complications: Secondary | ICD-10-CM | POA: Diagnosis not present

## 2020-05-04 DIAGNOSIS — Z6841 Body Mass Index (BMI) 40.0 and over, adult: Secondary | ICD-10-CM | POA: Diagnosis not present

## 2020-05-04 DIAGNOSIS — E785 Hyperlipidemia, unspecified: Secondary | ICD-10-CM | POA: Diagnosis not present

## 2020-05-04 DIAGNOSIS — M542 Cervicalgia: Secondary | ICD-10-CM | POA: Diagnosis not present

## 2020-05-04 DIAGNOSIS — E1169 Type 2 diabetes mellitus with other specified complication: Secondary | ICD-10-CM | POA: Diagnosis not present

## 2020-05-04 NOTE — Assessment & Plan Note (Signed)
Chronic, ongoing.  Continue current medication regimen and adjust as needed. Lipid panel today. 

## 2020-05-04 NOTE — Assessment & Plan Note (Signed)
Chronic, ongoing.  BP at goal today on repeat and mainly at goal on home readings.  Recommend he continue to check BP at home twice daily and document for provider visits. Continue current medication regimen at this time, is on max dose multiple medications.  May need to adjust in future and change HCTZ if return of gout flares presents.  BMP today.  Return in 3 months.

## 2020-05-04 NOTE — Assessment & Plan Note (Signed)
Chronic, ongoing, followed by endocrinology with insulin pump in place.  Continue current collaboration and pump settings per endo -- recent note reviewed.  A1C with endo recently 6.7%.  Recommend focus on diet and modest weight loss.  Does endorse poor diet.  Recommend he continue to monitor BS closely at home and if any consistent <70 immediately alert endo for pump changes.  Return in 3 months.

## 2020-05-04 NOTE — Assessment & Plan Note (Signed)
Continues to use CPAP faithfully every night and denies any issues with this.

## 2020-05-04 NOTE — Assessment & Plan Note (Signed)
Chronic, ongoing.  Followed by neurosurgery and has upcoming visit with pain clinic, continue these collaborations and current medications as ordered by them.

## 2020-05-04 NOTE — Progress Notes (Signed)
BP 128/82 (BP Location: Left Arm)   Pulse 73   Temp 97.9 F (36.6 C) (Oral)   Ht 6' 0.84" (1.85 m)   Wt (!) 328 lb 9.6 oz (149.1 kg)   SpO2 100%   BMI 43.55 kg/m    Subjective:    Patient ID: Aaron [name], male    DOB: [birth year], 69 y.o.   MRN: [id number]  HPI: Aaron [name] is a 69 y.o. male  Chief Complaint  Patient presents with  . Follow-up  . Diabetes   DIABETES Is followed by Dr. Gabriel Carina and last saw in November. A1C at that time was 6.7%. Uses insulin pump, per Dr. Gabriel Carina last note 04/13/20: "His MiniMed Medtronic insulin pump: Basal rates 12 am 4.0 units/hr 8:30 am 2 units/hr 11 am 3 units.hr 2 pm 4 units/hr 24-hr basal = 88 units Humalog  Bolus settings I:C ratios 12 am 1:4, 1 pm 1:3 Sensitivity 10 Target 100-110 Active insulin time 4 hrs, max bolus 25 units  Over last 30 days, he has checked a blood glucose FIVE times daily. Average BG over two weeks = 149 mg/dl. Average carbs entered into the pump daily = 225 g. Average total daily use of insulin = 170 units (basal 51%, bolus 49%). Changes site and fills canula q2 days. Average bolus per day = 5, all with Wizard calculator (67% with carbohydrate and 73% correction)."  He is followed by Dr. Matilde Sprang for retinopathy and last seen 03/03/20. Hypoglycemic episodes:no Polydipsia/polyuria:no Visual disturbance:no Chest pain:no Paresthesias:no Glucose Monitoring:yes Accucheck frequency: QID Fasting glucose: as above Post prandial: as above Evening: as above Before meals: as above Taking Insulin?:yes Long acting insulin: Short acting insulin: Blood Pressure Monitoring:not checking Retinal Examination:Up to Date Foot Exam:Up to Date Pneumovax:Up to Date Influenza:Up to Date Aspirin:yes  HYPERTENSION / HYPERLIPIDEMIA Continues on Losartan 100 MG, HCTZ 25 MG daily, Carvedilol 25 MG BID, Amlodipine 10 MG  daily. Takes Lipitor 20 MG.  Has OSA and uses CPAP every night.  On 09/16/19 imaging aortic atherosclerosis noted. Satisfied with current treatment?yes Duration of hypertension:chronic BP monitoring frequency:a couple times a day BP range: 130-140/60-70 range BP medication side effects:no Duration of hyperlipidemia:chronic Cholesterol medication side effects:no Cholesterol supplements: none Medication compliance:good compliance Aspirin:yes Recent stressors:no Recurrent headaches:no Visual changes:no Palpitations:no Dyspnea:no Chest pain:no Lower extremity edema:no Dizzy/lightheaded:no  NECK PAIN  Has had neck pain for several years -- is being followed by neurosurgery, last saw Dr. Lacinda Axon on 04/13/20.  Is being sent to pain clinic, 05/13/20.  Treatments attempted: rest, ice, heat, APAP and physical therapy  Compliant with recommended treatment: yes Relief with NSAIDs?:  No NSAIDs Taken Location:midline Duration:chronic Severity: moderate Quality: dull and aching Frequency: constant Radiation: none Aggravating factors: lifting, movement and bending Alleviating factors: rest, ice, heat, APAP and muscle relaxer Weakness:  no Paresthesias / decreased sensation:  no  Fevers:  no  Relevant past medical, surgical, family and social history reviewed and updated as indicated. Interim medical history since our last visit reviewed. Allergies and medications reviewed and updated.  Review of Systems  Constitutional: Negative for activity change, diaphoresis, fatigue and fever.  Respiratory: Negative for cough, chest tightness, shortness of breath and wheezing.   Cardiovascular: Negative for chest pain, palpitations and leg swelling.  Gastrointestinal: Negative.   Endocrine: Negative for cold intolerance, heat intolerance, polydipsia, polyphagia and polyuria.  Musculoskeletal: Positive for neck pain.  Neurological: Negative.   Psychiatric/Behavioral: Negative.      Per HPI unless specifically indicated above  Objective:    BP 128/82 (BP Location: Left Arm)   Pulse 73   Temp 97.9 F (36.6 C) (Oral)   Ht 6' 0.84" (1.85 m)   Wt (!) 328 lb 9.6 oz (149.1 kg)   SpO2 100%   BMI 43.55 kg/m   Wt Readings from Last 3 Encounters:  05/04/20 (!) 328 lb 9.6 oz (149.1 kg)  02/03/20 (!) 326 lb (147.9 kg)  01/14/20 (!) 327 lb (148.3 kg)    Physical Exam Vitals and nursing note reviewed.  Constitutional:      General: He is awake. He is not in acute distress.    Appearance: Normal appearance. He is well-groomed. He is morbidly obese. He is not ill-appearing or toxic-appearing.  HENT:     Head: Normocephalic and atraumatic.     Right Ear: Hearing and external ear normal. No drainage.     Left Ear: Hearing and external ear normal. No drainage.  Eyes:     General: No scleral icterus.       Right eye: No discharge.        Left eye: No discharge.     Pupils: Pupils are equal, round, and reactive to light.  Neck:     Comments: Decreased flexion, extension and rotation. Cardiovascular:     Rate and Rhythm: Normal rate and regular rhythm.     Pulses: Normal pulses.     Heart sounds: Normal heart sounds. No murmur heard. No friction rub. No gallop.   Pulmonary:     Effort: Pulmonary effort is normal. No respiratory distress.     Breath sounds: Normal breath sounds. No stridor. No wheezing, rhonchi or rales.  Chest:     Chest wall: No tenderness.  Musculoskeletal:     Cervical back: Neck supple. Decreased range of motion.  Skin:    General: Skin is warm and dry.     Capillary Refill: Capillary refill takes less than 2 seconds.     Coloration: Skin is not jaundiced or pale.     Findings: No bruising, erythema, lesion or rash.  Neurological:     General: No focal deficit present.     Mental Status: He is alert and oriented to person, place, and time. Mental status is at baseline.  Psychiatric:        Mood and Affect: Mood normal.         Behavior: Behavior normal. Behavior is cooperative.        Thought Content: Thought content normal.        Judgment: Judgment normal.     Results for orders placed or performed in visit on 03/05/20  HM DIABETES EYE EXAM  Result Value Ref Range   HM Diabetic Eye Exam No Retinopathy No Retinopathy      Assessment & Plan:   Problem List Items Addressed This Visit      Cardiovascular and Mediastinum   Hypertension associated with diabetes ([place])    Chronic, ongoing.  BP at goal today on repeat and mainly at goal on home readings.  Recommend he continue to check BP at home twice daily and document for provider visits. Continue current medication regimen at this time, is on max dose multiple medications.  May need to adjust in future and change HCTZ if return of gout flares presents.  BMP today.  Return in 3 months.      Relevant Orders   Basic metabolic panel   Aortic atherosclerosis ([place])    Noted on 09/16/19 imaging.  Continue  daily statin and ASA for prevention + focus on healthy diet and modest weight loss.        Respiratory   Sleep apnea    Continues to use CPAP faithfully every night and denies any issues with this.        Endocrine   Diabetes mellitus associated with hormonal etiology ([place]) - Primary    Chronic, ongoing, followed by endocrinology with insulin pump in place.  Continue current collaboration and pump settings per endo -- recent note reviewed.  A1C with endo recently 6.7%.  Recommend focus on diet and modest weight loss.  Does endorse poor diet.  Recommend he continue to monitor BS closely at home and if any consistent <70 immediately alert endo for pump changes.  Return in 3 months.      Hyperlipidemia associated with type 2 diabetes mellitus ([facility])    Chronic, ongoing.  Continue current medication regimen and adjust as needed.  Lipid panel today.         Relevant Orders   Lipid Panel w/o Chol/HDL Ratio   Type 2 diabetes mellitus with both eyes affected by  mild nonproliferative retinopathy without macular edema, with long-term current use of insulin ([facility])    Chronic, ongoing, followed by endocrinology with insulin pump in place + followed by Dr. Matilde Sprang for eye exams.  Continue current collaboration and pump settings per endo.  A1C recently 6.7%.  Recommend focus on diet and modest weight loss.  Does endorse poor diet.  Recommend he continue to monitor BS closely at home and if any consistent <70 immediately alert endo for pump changes.  Return in 3 months.        Other   BMI 40.0-44.9, adult ([place])    Recommend focus on modest weight loss with diet changes and regular exercise regimen.        Morbid obesity ([facility])    BMI 43.55 with T2DM, HTN.  Recommended eating smaller high protein, low fat meals more frequently and exercising 30 mins a day 5 times a week with a goal of 10-15lb weight loss in the next 3 months. Patient voiced their understanding and motivation to adhere to these recommendations.       Neck pain    Chronic, ongoing.  Followed by neurosurgery and has upcoming visit with pain clinic, continue these collaborations and current medications as ordered by them.          Follow up plan: Return in about 3 months (around 08/02/2020) for T2DM, HTN/HLD.

## 2020-05-04 NOTE — Patient Instructions (Signed)

## 2020-05-04 NOTE — Assessment & Plan Note (Signed)
Noted on 09/16/19 imaging.  Continue daily statin and ASA for prevention + focus on healthy diet and modest weight loss.

## 2020-05-04 NOTE — Assessment & Plan Note (Signed)
BMI 43.55 with T2DM, HTN.  Recommended eating smaller high protein, low fat meals more frequently and exercising 30 mins a day 5 times a week with a goal of 10-15lb weight loss in the next 3 months. Patient voiced their understanding and motivation to adhere to these recommendations.

## 2020-05-04 NOTE — Assessment & Plan Note (Signed)
Chronic, ongoing, followed by endocrinology with insulin pump in place + followed by Dr. Nice for eye exams.  Continue current collaboration and pump settings per endo.  A1C recently 6.7%.  Recommend focus on diet and modest weight loss.  Does endorse poor diet.  Recommend he continue to monitor BS closely at home and if any consistent <70 immediately alert endo for pump changes.  Return in 3 months. 

## 2020-05-04 NOTE — Assessment & Plan Note (Signed)
Recommend focus on modest weight loss with diet changes and regular exercise regimen.   

## 2020-05-05 LAB — BASIC METABOLIC PANEL
BUN/Creatinine Ratio: 15 (ref 10–24)
BUN: 17 mg/dL (ref 8–27)
CO2: 26 mmol/L (ref 20–29)
Calcium: 9.3 mg/dL (ref 8.6–10.2)
Chloride: 105 mmol/L (ref 96–106)
Creatinine, Ser: 1.17 mg/dL (ref 0.76–1.27)
GFR calc Af Amer: 74 mL/min/{1.73_m2} (ref >59)
GFR calc non Af Amer: 64 mL/min/{1.73_m2} (ref >59)
Glucose: 102 mg/dL — ABNORMAL HIGH (ref 65–99)
Potassium: 4.4 mmol/L (ref 3.5–5.2)
Sodium: 143 mmol/L (ref 134–144)

## 2020-05-05 LAB — LIPID PANEL W/O CHOL/HDL RATIO
Cholesterol, Total: 111 mg/dL (ref 100–199)
HDL: 30 mg/dL — ABNORMAL LOW (ref >39)
LDL Chol Calc (NIH): 65 mg/dL (ref 0–99)
Triglycerides: 81 mg/dL (ref 0–149)
VLDL Cholesterol Cal: 16 mg/dL (ref 5–40)

## 2020-05-05 NOTE — Progress Notes (Signed)
Contacted via [place] afternoon Zeddie, your labs have returned and overall kidney function remains stable and cholesterol levels are at goal.  Continue all current medications and if any questions let me know.   Keep being awesome!!  Thank you for allowing me to participate in your care. Kindest regards, Eleni Frank

## 2020-05-07 DIAGNOSIS — G4733 Obstructive sleep apnea (adult) (pediatric): Secondary | ICD-10-CM | POA: Diagnosis not present

## 2020-05-11 ENCOUNTER — Telehealth: Payer: Self-pay | Admitting: Pharmacist

## 2020-05-11 NOTE — Chronic Care Management (AMB) (Signed)
05/11/2020- CPA called and spoke to the patient regarding patient assistance application for Novolog that is ready at PCP's office to be signed and to provide income documentation.The patient verbalized he will stop by the PCP's office and get this done. CPA expressed that will be great.  Raynelle Highland, [place] Assistant [phone]

## 2020-05-13 ENCOUNTER — Ambulatory Visit
Payer: Medicare HMO | Attending: Student in an Organized Health Care Education/Training Program | Admitting: Student in an Organized Health Care Education/Training Program

## 2020-05-17 ENCOUNTER — Other Ambulatory Visit: Payer: Self-pay

## 2020-05-17 MED ORDER — TAMSULOSIN HCL 0.4 MG PO CAPS: 0.4000 mg | ORAL_CAPSULE | Freq: Every day | ORAL | 4 refills | Status: DC

## 2020-05-17 NOTE — Telephone Encounter (Signed)
Please send to [facility]

## 2020-05-23 DIAGNOSIS — E1165 Type 2 diabetes mellitus with hyperglycemia: Secondary | ICD-10-CM | POA: Diagnosis not present

## 2020-05-24 ENCOUNTER — Ambulatory Visit: Payer: Medicare HMO | Admitting: Pharmacist

## 2020-05-24 DIAGNOSIS — E785 Hyperlipidemia, unspecified: Secondary | ICD-10-CM

## 2020-05-24 DIAGNOSIS — E1169 Type 2 diabetes mellitus with other specified complication: Secondary | ICD-10-CM

## 2020-05-24 DIAGNOSIS — E1159 Type 2 diabetes mellitus with other circulatory complications: Secondary | ICD-10-CM

## 2020-05-24 NOTE — Chronic Care Management (AMB) (Signed)
Chronic Care Management Pharmacy  Name: Aaron [name]  MRN: [id number] DOB: 06-10-1950   Chief Complaint/ HPI  [name],  70 y.o. , male presents for their Follow-Up CCM visit with the clinical pharmacist via telephone due to COVID-19 Pandemic.  PCP : Venita Lick, NP Patient Care Team: Venita Lick, NP as PCP - General (Nurse Practitioner) Judi Cong, MD as Consulting Physician (Endocrinology) Vladimir Faster, [facility] as Pharmacist (Pharmacist)  Their chronic conditions include: Hypertension, Hyperlipidemia, Diabetes, Gout and H/O prostate cancer , Aortic atherosclerosis  Office Visits: 05/04/20- Marnee Guarneri, NP- blood work 02/03/20-Dr Wynetta Emery- bloodwork, flu shot, Flomax  Consult Visit 04/20/20- Dr. Gabriel Carina, Endocrinolgy- Basal rates 12 am 4.0 units/hr 8:30 am 2 units/hr 11 am 3 units.hr 2 pm 4 units/hr 24-hr basal = 88 units Humalog Bolus settings I:C ratios 12 am 1:4, 1 pm 1:3 Sensitivity 10 Target 100-110 Active insulin time 4 hrs, max bolus 25 units Checks 5 x day, 2 week avg BG 149, avg daily insulin 170 u foot exam, new Tandem pump in processing 04/13/20-DrLacinda Axon, Neurosurgery - no surgery indicated, referral to pain clinic, continue PT exercises at home 01/08/20- Dr. Bernardo Heater, Urololgy- Cystoscopy- no abnormal cytology 12/29/19 - Dr. Bernardo Heater, Urology- CT, schedule cystoscopy 12/26/19- Dr. Gabriel Carina, Endocrinology- no change to pump, labs   Allergies  Allergen Reactions  . Glucophage [Metformin] Diarrhea    Medications: Outpatient Encounter Medications as of 05/24/2020  Medication Sig Note  . Accu-Chek FastClix Lancets MISC 5 (five) times daily   . Accu-Chek FastClix Lancets MISC    . ACCU-CHEK GUIDE test strip    . acetaminophen (TYLENOL) 500 MG tablet Take 1-2 tablets (500-1,000 mg total) by mouth every 6 (six) hours as needed for mild pain (pain score 1-3 or temp > 100.5).   Marland Kitchen allopurinol (ZYLOPRIM) 300 MG tablet Take 1 tablet (300 mg total) by  mouth daily.   Marland Kitchen amLODipine (NORVASC) 10 MG tablet Take 1 tablet (10 mg total) by mouth daily. 11/11/2019: QPM  . ASPIRIN 81 PO every evening.    Marland Kitchen atorvastatin (LIPITOR) 20 MG tablet Take 1 tablet (20 mg total) by mouth daily.   . carvedilol (COREG) 25 MG tablet Take 1 tablet (25 mg total) by mouth 2 (two) times daily with a meal.   . hydrochlorothiazide (HYDRODIURIL) 25 MG tablet Take 1 tablet (25 mg total) by mouth daily.   . insulin aspart (NOVOLOG) 100 UNIT/ML injection Inject into the skin. Using via insulin pump  Medtronic MiniMed 630G insulin pump   . Insulin Human (INSULIN PUMP) SOLN Inject into the skin.   Marland Kitchen losartan (COZAAR) 100 MG tablet Take 1 tablet (100 mg total) by mouth daily. 11/11/2019: QPM  . tamsulosin (FLOMAX) 0.4 MG CAPS capsule Take 1 capsule (0.4 mg total) by mouth daily.    No facility-administered encounter medications on file as of 05/24/2020.    Wt Readings from Last 3 Encounters:  05/04/20 (!) 328 lb 9.6 oz (149.1 kg)  02/03/20 (!) 326 lb (147.9 kg)  01/14/20 (!) 327 lb (148.3 kg)    Current Diagnosis/Assessment:    Goals Addressed            This Visit's Progress   . Pharmacy Care Plan       CARE PLAN ENTRY (see longitudinal plan of care for additional care plan information)  Current Barriers:  . Chronic Disease Management support, education, and care coordination needs related to Hypertension, Hyperlipidemia, Diabetes, Gout, and History of  prostate cancer   Hypertension BP Readings from Last 3 Encounters:  05/04/20 128/82  02/03/20 118/73  01/14/20 (!) 143/78   . Pharmacist Clinical Goal(s): o Over the next 60 days, patient will work with PharmD and providers to achieve BP goal <130/80 . Current regimen:   Losartan 100 mg qd  HCTZ 25 mg qd  Amlodipine 10 mg qd . Interventions: o Reviewed home BP readings o Provided diet and exercise counseling. o Reviewed fill history . Patient self care activities - Over the next 60 days, patient  will: o Check BP daily, document, and provide at future appointments o Ensure daily salt intake < 2300 mg/day  Hyperlipidemia Lab Results  Component Value Date/Time   LDLCALC 65 05/04/2020 09:36 AM   . Pharmacist Clinical Goal(s): o Over the next 60 days, patient will work with PharmD and providers to maintain LDL goal < 70 . Current regimen:  o Atorvastatin 20 mg qd o Aspirin 81 mg qd . Interventions: o Provided diet and exercise counseling. . Patient self care activities - Over the next 60 days, patient will: o Increase exercise  o Focus on DASH diet  Diabetes Lab Results  Component Value Date/Time   HGBA1C 6.3 10/31/2019 08:53 AM   HGBA1C 6.9 08/19/2019 12:00 AM   HGBA1C 7.6 08/15/2017 12:00 AM   . Pharmacist Clinical Goal(s): o Over the next 60 days, patient will work with PharmD and providers to maintain A1c goal <7% . Current regimen:  o Novolog via insulin pump . Interventions: o Reviewed readings and notes from endo o PAP form faxed to endo for sig and faxed to [place] by endo 12/30 o Discussed upcoming transition to new insulin pump . Patient self care activities - Over the next 60 days, patient will: o Check blood sugar 3-4 times daily, document, and provide at future appointments o Contact provider with any episodes of hypoglycemia  *Medication management . Pharmacist Clinical Goal(s): o Over the next 60 days, patient will work with PharmD and providers to maintain optimal medication adherence . Current pharmacy: [facility] mail order . Interventions o Comprehensive medication review performed. o Continue current medication management strategy . Patient self care activities - Over the next 60 days, patient will: o Focus on medication adherence by fill dates o Take medications as prescribed o Report any questions or concerns to PharmD and/or provider(s)  Initial goal documentation        Diabetes   Recent Relevant Labs: Lab Results  Component Value  Date/Time   HGBA1C 6.3 10/31/2019 08:53 AM   HGBA1C 6.9 08/19/2019 12:00 AM   HGBA1C 7.6 08/15/2017 12:00 AM   MICROALBUR 80 (H) 02/03/2020 10:16 AM   MICROALBUR 30 (H) 03/21/2018 11:03 AM   HGBA1C 6.7 04/13/20 @ endo  Checking BG: five times daily  Recent FBG Readings: 114. 160  Patient is currently controlled on the following medications:   Novolog via insulin pump  Last diabetic Foot exam:  Lab Results  Component Value Date/Time   HMDIABEYEEXA No Retinopathy 03/03/2020 12:00 AM    Last diabetic Foot  exam: No results found for: HMDIABFOOTEX   We discussed:  Patient followed by endocrinology. Receives insulin from Novo PAP renewal app faxed by endo 05/20/20.  Patient has several vials on hand. Education for new pump scheduled for Friday. Denies any episodes of hypoglycemia. Last pump interogation at endo showed avg daily BG 149.  Plan  Continue current medications  Hypertension   BP goal is:  <130/80  Office blood  pressures are  BP Readings from Last 3 Encounters:  05/04/20 128/82  02/03/20 118/73  01/14/20 (!) 143/78   BMP Latest Ref Rng & Units 05/04/2020 02/03/2020 10/31/2019  Glucose 65 - 99 mg/dL 102(H) 76 96  BUN 8 - 27 mg/dL 17 18 20   Creatinine 0.76 - 1.27 mg/dL 1.17 1.26 1.22  BUN/Creat Ratio 10 - 24 15 14 16   Sodium 134 - 144 mmol/L 143 144 143  Potassium 3.5 - 5.2 mmol/L 4.4 4.8 4.0  Chloride 96 - 106 mmol/L 105 102 104  CO2 20 - 29 mmol/L 26 28 25   Calcium 8.6 - 10.2 mg/dL 9.3 9.4 9.2     Patient has failed these meds in the past: NA  Patient checks BP at home once-twice dialy Reading are ranging 140-160/65-70  Patient is currently controlled on the following:  Losartan 100 mg qd  HCTZ 25 mg qd  Amlodipine 10 mg qd  We discussed: Systolic readings still slightly elevated. Encouraged patient to bring BP log to next office visit.  Plan  Continue current medications     Hyperlipidemia/ Aortic atherosclerosis   LDL goal < 70  Last  lipids Lab Results  Component Value Date   CHOL 111 05/04/2020   HDL 30 (L) 05/04/2020   LDLCALC 65 05/04/2020   TRIG 81 05/04/2020   CHOLHDL 3.1 08/15/2017   Hepatic Function Latest Ref Rng & Units 10/31/2019 07/02/2019 03/21/2018  Total Protein 6.0 - 8.5 g/dL 6.8 7.3 -  Albumin 3.8 - 4.8 g/dL 4.2 4.2 -  AST 0 - 40 IU/L 12 17 21   ALT 0 - 44 IU/L 12 13 17   Alk Phosphatase 48 - 121 IU/L 145(H) 144(H) -  Total Bilirubin 0.0 - 1.2 mg/dL 0.4 0.3 -     The ASCVD Risk score ([place]., et al., 2013) failed to calculate for the following reasons:   The valid total cholesterol range is 130 to 320 mg/dL   Patient has failed these meds in past: NA Patient is currently controlled on the following medications:  . Aspirin 81 mg qd  . Atorvastatin 20 mg qd  We discussed:  Diet and exercise. Reviewed signs/symptoms of bleeding.  Plan  Continue current medications  H/O Prostate Cancer   Patient has failed these meds in past: NA Patient is currently controlled on the following medications:  . Tamsulosin 0.4 mg qd  We discussed:  Patient follows with Urology.   Plan  Continue current medications  Will discuss gout and vaccines at next appointment Medication Management   Pt uses [facility] mail order and [place] for all medications   Plan  Continue current medication management strategy    Follow up: 3  month phone visit  Junita Push. Kenton Kingfisher PharmD, [facility] [phone]

## 2020-05-24 NOTE — Patient Instructions (Addendum)
Visit Information  It was a pleasure speaking with you today. Thank you for letting me be part of your clinical team. Please call with any questions or concerns.   Goals Addressed            This Visit's Progress   . Pharmacy Care Plan       CARE PLAN ENTRY (see longitudinal plan of care for additional care plan information)  Current Barriers:  . Chronic Disease Management support, education, and care coordination needs related to Hypertension, Hyperlipidemia, Diabetes, Gout, and History of  prostate cancer   Hypertension BP Readings from Last 3 Encounters:  05/04/20 128/82  02/03/20 118/73  01/14/20 (!) 143/78   . Pharmacist Clinical Goal(s): o Over the next 60 days, patient will work with PharmD and providers to achieve BP goal <130/80 . Current regimen:   Losartan 100 mg qd  HCTZ 25 mg qd  Amlodipine 10 mg qd . Interventions: o Reviewed home BP readings o Provided diet and exercise counseling. o Reviewed fill history . Patient self care activities - Over the next 60 days, patient will: o Check BP daily, document, and provide at future appointments o Ensure daily salt intake < 2300 mg/day  Hyperlipidemia Lab Results  Component Value Date/Time   LDLCALC 65 05/04/2020 09:36 AM   . Pharmacist Clinical Goal(s): o Over the next 60 days, patient will work with PharmD and providers to maintain LDL goal < 70 . Current regimen:  o Atorvastatin 20 mg qd o Aspirin 81 mg qd . Interventions: o Provided diet and exercise counseling. . Patient self care activities - Over the next 60 days, patient will: o Increase exercise  o Focus on DASH diet  Diabetes Lab Results  Component Value Date/Time   HGBA1C 6.3 10/31/2019 08:53 AM   HGBA1C 6.9 08/19/2019 12:00 AM   HGBA1C 7.6 08/15/2017 12:00 AM   . Pharmacist Clinical Goal(s): o Over the next 60 days, patient will work with PharmD and providers to maintain A1c goal <7% . Current regimen:  o Novolog via insulin  pump . Interventions: o Reviewed readings and notes from endo o PAP form faxed to endo for sig and faxed to [facility] by endo 12/30 o Discussed upcoming transition to new insulin pump . Patient self care activities - Over the next 60 days, patient will: o Check blood sugar 3-4 times daily, document, and provide at future appointments o Contact provider with any episodes of hypoglycemia  *Medication management . Pharmacist Clinical Goal(s): o Over the next 60 days, patient will work with PharmD and providers to maintain optimal medication adherence . Current pharmacy: [facility] mail order . Interventions o Comprehensive medication review performed. o Continue current medication management strategy . Patient self care activities - Over the next 60 days, patient will: o Focus on medication adherence by fill dates o Take medications as prescribed o Report any questions or concerns to PharmD and/or provider(s)  Initial goal documentation        The patient verbalized understanding of instructions, educational materials, and care plan provided today and agreed to receive a mailed copy of patient instructions, educational materials, and care plan.   Telephone follow up appointment with pharmacy team member scheduled for: 2 months  Junita Push. Kenton Kingfisher PharmD, BCPS Clinical Pharmacist [phone]  Diabetes Mellitus and Exercise Exercising regularly is important for your overall health, especially when you have diabetes (diabetes mellitus). Exercising is not only about losing weight. It has many other health benefits, such as increasing muscle  strength and bone density and reducing body fat and stress. This leads to improved fitness, flexibility, and endurance, all of which result in better overall health. Exercise has additional benefits for people with diabetes, including:  Reducing appetite.  Helping to lower and control blood glucose.  Lowering blood pressure.  Helping to control amounts  of fatty substances (lipids) in the blood, such as cholesterol and triglycerides.  Helping the body to respond better to insulin (improving insulin sensitivity).  Reducing how much insulin the body needs.  Decreasing the risk for heart disease by: ? Lowering cholesterol and triglyceride levels. ? Increasing the levels of good cholesterol. ? Lowering blood glucose levels. What is my activity plan? Your health care provider or certified diabetes educator can help you make a plan for the type and frequency of exercise (activity plan) that works for you. Make sure that you:  Do at least 150 minutes of moderate-intensity or vigorous-intensity exercise each week. This could be brisk walking, biking, or water aerobics. ? Do stretching and strength exercises, such as yoga or weightlifting, at least 2 times a week. ? Spread out your activity over at least 3 days of the week.  Get some form of physical activity every day. ? Do not go more than 2 days in a row without some kind of physical activity. ? Avoid being inactive for more than 30 minutes at a time. Take frequent breaks to walk or stretch.  Choose a type of exercise or activity that you enjoy, and set realistic goals.  Start slowly, and gradually increase the intensity of your exercise over time. What do I need to know about managing my diabetes?   Check your blood glucose before and after exercising. ? If your blood glucose is 240 mg/dL (13.3 mmol/L) or higher before you exercise, check your urine for ketones. If you have ketones in your urine, do not exercise until your blood glucose returns to normal. ? If your blood glucose is 100 mg/dL (5.6 mmol/L) or lower, eat a snack containing 15-20 grams of carbohydrate. Check your blood glucose 15 minutes after the snack to make sure that your level is above 100 mg/dL (5.6 mmol/L) before you start your exercise.  Know the symptoms of low blood glucose (hypoglycemia) and how to treat it. Your  risk for hypoglycemia increases during and after exercise. Common symptoms of hypoglycemia can include: ? Hunger. ? Anxiety. ? Sweating and feeling clammy. ? Confusion. ? Dizziness or feeling light-headed. ? Increased heart rate or palpitations. ? Blurry vision. ? Tingling or numbness around the mouth, lips, or tongue. ? Tremors or shakes. ? Irritability.  Keep a rapid-acting carbohydrate snack available before, during, and after exercise to help prevent or treat hypoglycemia.  Avoid injecting insulin into areas of the body that are going to be exercised. For example, avoid injecting insulin into: ? The arms, when playing tennis. ? The legs, when jogging.  Keep records of your exercise habits. Doing this can help you and your health care provider adjust your diabetes management plan as needed. Write down: ? Food that you eat before and after you exercise. ? Blood glucose levels before and after you exercise. ? The type and amount of exercise you have done. ? When your insulin is expected to peak, if you use insulin. Avoid exercising at times when your insulin is peaking.  When you start a new exercise or activity, work with your health care provider to make sure the activity is safe for you, and  to adjust your insulin, medicines, or food intake as needed.  Drink plenty of water while you exercise to prevent dehydration or heat stroke. Drink enough fluid to keep your urine clear or pale yellow. Summary  Exercising regularly is important for your overall health, especially when you have diabetes (diabetes mellitus).  Exercising has many health benefits, such as increasing muscle strength and bone density and reducing body fat and stress.  Your health care provider or certified diabetes educator can help you make a plan for the type and frequency of exercise (activity plan) that works for you.  When you start a new exercise or activity, work with your health care provider to make sure  the activity is safe for you, and to adjust your insulin, medicines, or food intake as needed. This information is not intended to replace advice given to you by your health care provider. Make sure you discuss any questions you have with your health care provider. Document Revised: 11/30/2016 Document Reviewed: 10/18/2015 Elsevier Patient Education  2020 [employer].  Managing Your Hypertension Hypertension is commonly called high blood pressure. This is when the force of your blood pressing against the walls of your arteries is too strong. Arteries are blood vessels that carry blood from your heart throughout your body. Hypertension forces the heart to work harder to pump blood, and may cause the arteries to become narrow or stiff. Having untreated or uncontrolled hypertension can cause heart attack, stroke, kidney disease, and other problems. What are blood pressure readings? A blood pressure reading consists of a higher number over a lower number. Ideally, your blood pressure should be below 120/80. The first ("top") number is called the systolic pressure. It is a measure of the pressure in your arteries as your heart beats. The second ("bottom") number is called the diastolic pressure. It is a measure of the pressure in your arteries as the heart relaxes. What does my blood pressure reading mean? Blood pressure is classified into four stages. Based on your blood pressure reading, your health care provider may use the following stages to determine what type of treatment you need, if any. Systolic pressure and diastolic pressure are measured in a unit called mm Hg. Normal  Systolic pressure: below 120.  Diastolic pressure: below 80. Elevated  Systolic pressure: 120-129.  Diastolic pressure: below 80. Hypertension stage 1  Systolic pressure: 130-139.  Diastolic pressure: 80-89. Hypertension stage 2  Systolic pressure: 140 or above.  Diastolic pressure: 90 or above. What health  risks are associated with hypertension? Managing your hypertension is an important responsibility. Uncontrolled hypertension can lead to:  A heart attack.  A stroke.  A weakened blood vessel (aneurysm).  Heart failure.  Kidney damage.  Eye damage.  Metabolic syndrome.  Memory and concentration problems. What changes can I make to manage my hypertension? Hypertension can be managed by making lifestyle changes and possibly by taking medicines. Your health care provider will help you make a plan to bring your blood pressure within a normal range. Eating and drinking   Eat a diet that is high in fiber and potassium, and low in salt (sodium), added sugar, and fat. An example eating plan is called the DASH (Dietary Approaches to Stop Hypertension) diet. To eat this way: ? Eat plenty of fresh fruits and vegetables. Try to fill half of your plate at each meal with fruits and vegetables. ? Eat whole grains, such as whole wheat pasta, brown rice, or whole grain bread. Fill about one quarter  of your plate with whole grains. ? Eat low-fat diary products. ? Avoid fatty cuts of meat, processed or cured meats, and poultry with skin. Fill about one quarter of your plate with lean proteins such as fish, chicken without skin, beans, eggs, and tofu. ? Avoid premade and processed foods. These tend to be higher in sodium, added sugar, and fat.  Reduce your daily sodium intake. Most people with hypertension should eat less than 1,500 mg of sodium a day.  Limit alcohol intake to no more than 1 drink a day for nonpregnant women and 2 drinks a day for men. One drink equals 12 oz of beer, 5 oz of wine, or 1 oz of hard liquor. Lifestyle  Work with your health care provider to maintain a healthy body weight, or to lose weight. Ask what an ideal weight is for you.  Get at least 30 minutes of exercise that causes your heart to beat faster (aerobic exercise) most days of the week. Activities may include  walking, swimming, or biking.  Include exercise to strengthen your muscles (resistance exercise), such as weight lifting, as part of your weekly exercise routine. Try to do these types of exercises for 30 minutes at least 3 days a week.  Do not use any products that contain nicotine or tobacco, such as cigarettes and e-cigarettes. If you need help quitting, ask your health care provider.  Control any long-term (chronic) conditions you have, such as high cholesterol or diabetes. Monitoring  Monitor your blood pressure at home as told by your health care provider. Your personal target blood pressure may vary depending on your medical conditions, your age, and other factors.  Have your blood pressure checked regularly, as often as told by your health care provider. Working with your health care provider  Review all the medicines you take with your health care provider because there may be side effects or interactions.  Talk with your health care provider about your diet, exercise habits, and other lifestyle factors that may be contributing to hypertension.  Visit your health care provider regularly. Your health care provider can help you create and adjust your plan for managing hypertension. Will I need medicine to control my blood pressure? Your health care provider may prescribe medicine if lifestyle changes are not enough to get your blood pressure under control, and if:  Your systolic blood pressure is 130 or higher.  Your diastolic blood pressure is 80 or higher. Take medicines only as told by your health care provider. Follow the directions carefully. Blood pressure medicines must be taken as prescribed. The medicine does not work as well when you skip doses. Skipping doses also puts you at risk for problems. Contact a health care provider if:  You think you are having a reaction to medicines you have taken.  You have repeated (recurrent) headaches.  You feel dizzy.  You have  swelling in your ankles.  You have trouble with your vision. Get help right away if:  You develop a severe headache or confusion.  You have unusual weakness or numbness, or you feel faint.  You have severe pain in your chest or abdomen.  You vomit repeatedly.  You have trouble breathing. Summary  Hypertension is when the force of blood pumping through your arteries is too strong. If this condition is not controlled, it may put you at risk for serious complications.  Your personal target blood pressure may vary depending on your medical conditions, your age, and other factors. For most  people, a normal blood pressure is less than 120/80.  Hypertension is managed by lifestyle changes, medicines, or both. Lifestyle changes include weight loss, eating a healthy, low-sodium diet, exercising more, and limiting alcohol. This information is not intended to replace advice given to you by your health care provider. Make sure you discuss any questions you have with your health care provider. Document Revised: 08/30/2018 Document Reviewed: 04/05/2016 Elsevier Patient Education  [employer].

## 2020-06-03 ENCOUNTER — Other Ambulatory Visit: Payer: Self-pay

## 2020-06-03 ENCOUNTER — Encounter: Payer: Self-pay | Admitting: Student in an Organized Health Care Education/Training Program

## 2020-06-03 ENCOUNTER — Ambulatory Visit
Payer: Medicare HMO | Attending: Student in an Organized Health Care Education/Training Program | Admitting: Student in an Organized Health Care Education/Training Program

## 2020-06-03 VITALS — BP 140/78 | HR 68 | Temp 97.8°F | Ht 75.0 in | Wt 327.0 lb

## 2020-06-03 DIAGNOSIS — G894 Chronic pain syndrome: Secondary | ICD-10-CM | POA: Insufficient documentation

## 2020-06-03 DIAGNOSIS — M4802 Spinal stenosis, cervical region: Secondary | ICD-10-CM

## 2020-06-03 DIAGNOSIS — M47812 Spondylosis without myelopathy or radiculopathy, cervical region: Secondary | ICD-10-CM | POA: Insufficient documentation

## 2020-06-03 DIAGNOSIS — M5412 Radiculopathy, cervical region: Secondary | ICD-10-CM | POA: Diagnosis not present

## 2020-06-03 HISTORY — DX: Spinal stenosis, cervical region: M48.02

## 2020-06-03 MED ORDER — TIZANIDINE HCL 4 MG PO TABS: 2.0000 mg | ORAL_TABLET | Freq: Every evening | ORAL | 1 refills | Status: DC | PRN

## 2020-06-03 MED ORDER — GABAPENTIN 300 MG PO CAPS: ORAL_CAPSULE | ORAL | 0 refills | Status: DC

## 2020-06-03 NOTE — Progress Notes (Signed)
Patient: Aaron [name]  Service Category: E/M  Provider: Gillis Santa, MD  DOB: 1950-11-12  DOS: 06/03/2020  Referring Provider: Venita Lick, NP  MRN: [id number]  Setting: Ambulatory outpatient  PCP: Venita Lick, NP  Type: New Patient  Specialty: Interventional Pain Management    Location: Office  Delivery: Face-to-face     Primary Reason(s) for Visit: Encounter for initial evaluation of one or more chronic problems (new to examiner) potentially causing chronic pain, and posing a threat to normal musculoskeletal function. (Level of risk: High) CC: Neck Pain  HPI  Aaron [name] is a 70 y.o. year old, male patient, who comes for the first time to our practice referred by Venita Lick, NP for our initial evaluation of his chronic pain. He has Diabetes mellitus associated with hormonal etiology ([place]); Hyperlipidemia associated with type 2 diabetes mellitus ([place]); Hypertension associated with diabetes ([place]); Sleep apnea; Gout; History of prostate cancer; S/P hip replacement; BMI 40.0-44.9, adult ([place]); Cervical spondylosis; Advanced care planning/counseling discussion; Personal history of prostate cancer; Status post total hip replacement, right; Morbid obesity ([place]); Presence of insulin pump; Skin lesion; Asymptomatic microscopic hematuria; Type 2 diabetes mellitus with both eyes affected by mild nonproliferative retinopathy without macular edema, with long-term current use of insulin ([place]); Aortic atherosclerosis ([place]); Neck pain; Spinal stenosis in cervical region; Cervical radicular pain; and Chronic pain syndrome on their problem list. Today he comes in for evaluation of his Neck Pain  Pain Assessment: Location:   Neck Radiating: at times pain radiaties down to his shoulder Onset: More than a month ago Duration: Chronic pain Quality: Nagging,Aching,Throbbing Severity: 5 /10 (subjective, self-reported pain score)  Effect on ADL: limits my daily activities Timing:  Constant Modifying factors: nothing BP: 140/78  HR: 68  Onset and Duration: Gradual and Present longer than 3 months Cause of pain: Unknown Severity: No change since onset, NAS-11 at its worse: 5/10, NAS-11 at its best: 5/10, NAS-11 now: 5/10 and NAS-11 on the average: 5/105 Timing: Not influenced by the time of the day Aggravating Factors: Bending, Climbing, Kneeling, Lifiting, Motion, Prolonged sitting, Prolonged standing, Squatting, Stooping , Twisting, Walking, Walking uphill and Walking downhill Alleviating Factors: nothing Associated Problems: Constipation and Erectile dysfunction Quality of Pain: Aching, Nagging, Sharp and Throbbing Previous Examinations or Tests: MRI scan, X-rays and Neurosurgical evaluation Previous Treatments: Physical Therapy, Steroid treatments by mouth, Strengthening exercises and Stretching exercises  Aaron [name] is a pleasant 70 year old male who presents with a chief complaint of neck pain that radiates to bilateral shoulders as well as posterior dominant occipital headaches.  Patient has been evaluated by neurosurgery.  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  He is a English as a second language teacher.  He has tried physical therapy which was not helpful.  He says he takes his grandchildren to school so would like to avoid anything that can be sedating during the day.  He would like to avoid opioid analgesics.  He has tried various anti-inflammatories in the past including Celebrex, Aleve, Mobic, naproxen with limited benefit.  He denies having trialed gabapentin, Lyrica, Cymbalta.  He has tried Flexeril but not Robaxin or tizanidine.  He has not had any sort of injections.  He states that he has limited ability to look right or left given severe pain.  He has also limited cervical extension.  No weakness of the upper extremity.  Meds  Current  Outpatient Medications:  .  ACCU-CHEK GUIDE test strip, , Disp: , Rfl:  .  acetaminophen (TYLENOL) 500 MG tablet, Take 1-2 tablets (500-1,000 mg total) by mouth every 6 (six) hours as needed for mild pain (pain score 1-3 or temp > 100.5)., Disp: , Rfl:  .  allopurinol (ZYLOPRIM) 300 MG tablet, Take 1 tablet (300 mg total) by mouth daily., Disp: 90 tablet, Rfl: 4 .  amLODipine (NORVASC) 10 MG tablet, Take 1 tablet (10 mg total) by mouth daily., Disp: 90 tablet, Rfl: 4 .  ASPIRIN 81 PO, every evening. , Disp: , Rfl:  .  atorvastatin (LIPITOR) 20 MG tablet, Take 1 tablet (20 mg total) by mouth daily., Disp: 90 tablet, Rfl: 4 .  carvedilol (COREG) 25 MG tablet, Take 1 tablet (25 mg total) by mouth 2 (two) times daily with a meal., Disp: 180 tablet, Rfl: 4 .  gabapentin (NEURONTIN) 300 MG capsule, Take 1 capsule (300 mg total) by mouth at bedtime for 15 days, THEN 2 capsules (600 mg total) at bedtime., Disp: 75 capsule, Rfl: 0 .  hydrochlorothiazide (HYDRODIURIL) 25 MG tablet, Take 1 tablet (25 mg total) by mouth daily., Disp: 90 tablet, Rfl: 4 .  insulin aspart (NOVOLOG) 100 UNIT/ML injection, Inject into the skin. Using via insulin pump  Medtronic MiniMed 630G insulin pump, Disp: , Rfl:  .  Insulin Human (INSULIN PUMP) SOLN, Inject into the skin., Disp: , Rfl:  .  losartan (COZAAR) 100 MG tablet, Take 1 tablet (100 mg total) by mouth daily., Disp: 90 tablet, Rfl: 4 .  tamsulosin (FLOMAX) 0.4 MG CAPS capsule, Take 1 capsule (0.4 mg total) by mouth daily., Disp: 90 capsule, Rfl: 4 .  tiZANidine (ZANAFLEX) 4 MG tablet, Take 0.5-1 tablets (2-4 mg total) by mouth at bedtime as needed for muscle spasms., Disp: 30 tablet, Rfl: 1 .  Accu-Chek FastClix Lancets MISC, 5 (five) times daily, Disp: , Rfl:  .  Accu-Chek FastClix Lancets MISC, , Disp: , Rfl:   Imaging Review  Cervical Imaging: Cervical MR wo contrast: Results for orders placed during the hospital encounter of 09/16/19  MR CERVICAL SPINE WO  CONTRAST  Narrative CLINICAL DATA:  Neck pain with bilateral shoulder pain  EXAM: MRI CERVICAL SPINE WITHOUT CONTRAST  TECHNIQUE: Multiplanar, multisequence MR imaging of the cervical spine was performed. No intravenous contrast was administered.  COMPARISON:  None.  FINDINGS: Alignment: Straightening of the cervical spine  Vertebrae: No fracture, evidence of discitis, or bone lesion.  Cord: Flattening at C2-3 and C3-4, without detected cord signal abnormality.  Posterior Fossa, vertebral arteries, paraspinal tissues: Negative.  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 This ossification was also seen on a 2018 maxillofacial CT. Uncovertebral spurring with patent foramina  C3-4: Central disc protrusion, endplate ridging, and ligamentum flavum thickening with 4 mm AP canal diameter in the midline. High-grade left foraminal narrowing.  C4-5: Ventral bridging spondylosis.  No visible impingement  C5-6: Ventral bridging spondylosis.  No visible impingement  C6-7: Ventral bridging spondylosis.  No impingement  C7-T1:Ventral bridging spondylosis.  T1-2: Degenerative facet spurring asymmetric to the right, with foraminal narrowing.  IMPRESSION: 1. C2-3 and C3-4 high-grade spinal stenosis with cord flattening. 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 2. C3-4 left foraminal impingement. 3. C4-T1 ankylosis from ventral flowing osteophytes. 4. T1-2 adjacent segment facet osteoarthritis with spurs encroaching  on the foramina.   Electronically Signed By: Monte Fantasia M.D. On: 09/17/2019 07:18  Shoulder-L DG: Results for orders placed during the hospital encounter of 06/11/16  DG Shoulder Left  Narrative CLINICAL DATA:  Pain after fall  EXAM: LEFT SHOULDER - 2+ VIEW  COMPARISON:   None.  FINDINGS: There is no evidence of fracture or dislocation. There is no evidence of arthropathy or other focal bone abnormality. Soft tissues are unremarkable.  IMPRESSION: Negative.   Electronically Signed By: Dorise Bullion III M.D On: 06/11/2016 13:12  Lumbosacral Imaging: Lumbar MR wo contrast: Results for orders placed during the hospital encounter of 01/22/15  MR Lumbar Spine Wo Contrast  Narrative CLINICAL DATA:  Low back pain with sciatica unspecified laterality. Report of CT scan showing abnormal L1 vertebral body.  EXAM: MRI LUMBAR SPINE WITHOUT CONTRAST  TECHNIQUE: Multiplanar, multisequence MR imaging of the lumbar spine was performed. No intravenous contrast was administered.  COMPARISON:  Abdominal CT 04/20/2010  FINDINGS: There is transitional lumbosacral anatomy. Spinal numbering is based on the reported scheme with a L1 vertebral body lesion on previous CT.  There is diffuse altered marrow signal within the L1 vertebral body with a predominantly fatty appearance, especially to the left posterior aspect of the body. On axial imaging, there is a stippled appearance suggesting trabecular coarsening. Abnormal appearance also seen on abdominal CT 04/20/2010, without gross progression when accounting for cross modality differences. The abdominal CT appearance has findings which could reflect Paget's disease, but there is no cortical thickening or expansion. Appearance favors hemangioma or other benign process. No extraosseous signal abnormality to suggest aggressive hemangioma. No other focal marrow signal abnormality concerning for metastatic disease in this patient with prostate cancer.  Degenerative changes:  T12- L1: Bridging osteophytes around the posterior elements, greater on the right and better seen by previous CT. There is advanced biforaminal stenosis. Patent spinal canal  L1-L2: Endplate spondylosis and facet overgrowth and  ligamentous ossification/ thickening. Moderate bilateral foraminal stenosis. Patent spinal canal.  L2-L3: Extensive posterior element overgrowth with ligamentous thickening and ossification. Spondylotic spurring which is moderate. No suspected impingement.  L3-L4: Severe ligamentous and bony overgrowth. Bulky spondylosis. Moderate spinal canal stenosis and right foraminal stenosis.  L4-L5: Advanced facet ligament overgrowth with advanced canal stenosis and subarticular recess effacement. Bulky spondylosis with disc narrowing. Bilateral moderate to advanced foraminal stenosis with impingement at the foraminal entry, mainly from facet disease.  L5-S1:Advanced facet ligament overgrowth. Spondylosis and disc bulging. Right foraminal stenosis with L5 impingement by facet spur. Mild right subarticular recess stenosis. Mild spinal canal stenosis  IMPRESSION: 1. The reported L1 vertebral body lesion has benign MR features most consistent with hemangioma. These changes were also seen on abdominal CT from 2011. If outside CT is available for review, would gladly make comparison with 2011 CT. 2. Advanced spondylosis and facet arthropathy with L3-4 moderate and L4-5 advanced spinal canal stenosis. 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 3. Multilevel foraminal stenosis with impingement, as above.   Electronically Signed By: Monte Fantasia M.D. On: 01/22/2015 10:19  DG HIP UNILAT W OR W/O PELVIS 2-3 VIEWS RIGHT  Narrative CLINICAL DATA:  Status post right hip replacement.  EXAM: DG HIP (WITH OR WITHOUT PELVIS) 2-3V RIGHT  COMPARISON:  No recent studies in PACs  FINDINGS: Two fluoro spot images of the right hip are reviewed. Radiographic positioning of the prosthetic components is good. The interface with the native bone appears normal. There are surgical drain lines and skin  staples present.  IMPRESSION: No immediate postprocedure complication following  right hip joint prosthesis placement.   Electronically Signed By: David  Martinique M.D. On: 11/27/2017 13:15   Complexity Note: Imaging results reviewed. Results shared with Aaron [name], using Layman's terms.                         ROS  Cardiovascular: Daily Aspirin intake, High blood pressure and Needs antibiotics prior to dental procedures Pulmonary or Respiratory: Temporary stoppage of breathing during sleep Neurological: No reported neurological signs or symptoms such as seizures, abnormal skin sensations, urinary and/or fecal incontinence, being born with an abnormal open spine and/or a tethered spinal cord Psychological-Psychiatric: No reported psychological or psychiatric signs or symptoms such as difficulty sleeping, anxiety, depression, delusions or hallucinations (schizophrenial), mood swings (bipolar disorders) or suicidal ideations or attempts Gastrointestinal: No reported gastrointestinal signs or symptoms such as vomiting or evacuating blood, reflux, heartburn, alternating episodes of diarrhea and constipation, inflamed or scarred liver, or pancreas or irrregular and/or infrequent bowel movements Genitourinary: Passing kidney stones and Peeing blood Hematological: No reported hematological signs or symptoms such as prolonged bleeding, low or poor functioning platelets, bruising or bleeding easily, hereditary bleeding problems, low energy levels due to low hemoglobin or being anemic Endocrine: High blood sugar controlled without the use of insulin (NIDDM) Rheumatologic: No reported rheumatological signs and symptoms such as fatigue, joint pain, tenderness, swelling, redness, heat, stiffness, decreased range of motion, with or without associated rash Musculoskeletal: Negative for myasthenia gravis, muscular dystrophy, multiple sclerosis or malignant hyperthermia Work History: Retired  Allergies  Aaron [name] is allergic to glucophage [metformin].  Laboratory Chemistry Profile    Renal Lab Results  Component Value Date   BUN 17 05/04/2020   CREATININE 1.17 05/04/2020   BCR 15 05/04/2020   GFRAA 74 05/04/2020   GFRNONAA 64 05/04/2020   SPECGRAV 1.020 02/03/2020   PHUR 6.5 02/03/2020   PROTEINUR Trace (A) 02/03/2020     Electrolytes Lab Results  Component Value Date   NA 143 05/04/2020   K 4.4 05/04/2020   CL 105 05/04/2020   CALCIUM 9.3 05/04/2020     Hepatic Lab Results  Component Value Date   AST 12 10/31/2019   ALT 12 10/31/2019   ALBUMIN 4.2 10/31/2019   ALKPHOS 145 (H) 10/31/2019     ID Lab Results  Component Value Date   HIV Non Reactive 04/26/2015   STAPHAUREUS NEGATIVE 11/14/2017   MRSAPCR NEGATIVE 11/14/2017     Bone No results found for: VD25OH, CN470JG2EZM, OQ9476LY6, TK3546FK8, 25OHVITD1, 25OHVITD2, 25OHVITD3, TESTOFREE, TESTOSTERONE   Endocrine Lab Results  Component Value Date   GLUCOSE 102 (H) 05/04/2020   GLUCOSEU Negative 02/03/2020   HGBA1C 6.3 10/31/2019   TSH 2.610 10/31/2019     Neuropathy Lab Results  Component Value Date   HGBA1C 6.3 10/31/2019   HIV Non Reactive 04/26/2015     CNS No results found for: COLORCSF, APPEARCSF, RBCCOUNTCSF, WBCCSF, POLYSCSF, LYMPHSCSF, EOSCSF, PROTEINCSF, GLUCCSF, JCVIRUS, CSFOLI, IGGCSF, LABACHR, ACETBL, LABACHR, ACETBL   Inflammation (CRP: Acute  ESR: Chronic) Lab Results  Component Value Date   ESRSEDRATE 43 (H) 11/14/2017     Rheumatology Lab Results  Component Value Date   LABURIC 6.1 10/31/2019     Coagulation Lab Results  Component Value Date   INR 1.06 11/14/2017   LABPROT 13.7 11/14/2017   APTT 38 (H) 11/14/2017   PLT 285 09/08/2019     Cardiovascular Lab Results  Component  Value Date   HGB 13.5 09/08/2019   HCT 40.8 09/08/2019     Screening Lab Results  Component Value Date   STAPHAUREUS NEGATIVE 11/14/2017   MRSAPCR NEGATIVE 11/14/2017   HIV Non Reactive 04/26/2015     Cancer No results found for: CEA, CA125, LABCA2   Allergens No  results found for: ALMOND, APPLE, ASPARAGUS, AVOCADO, BANANA, BARLEY, BASIL, BAYLEAF, GREENBEAN, LIMABEAN, WHITEBEAN, BEEFIGE, REDBEET, BLUEBERRY, BROCCOLI, CABBAGE, MELON, CARROT, CASEIN, CASHEWNUT, CAULIFLOWER, CELERY     Note: Lab results reviewed.  [place]  Drug: Aaron [name]  reports no history of drug use. Alcohol:  reports no history of alcohol use. Tobacco:  reports that he has never smoked. He has never used smokeless tobacco. Medical:  has a past medical history of Cancer ([place]) (2013), Diabetes mellitus without complication ([place]), Gout, Hyperlipidemia, Hypertension, and Sleep apnea. Family: family history includes Breast cancer in his mother; Cancer in his maternal uncle, mother, and sister; Stroke in his father and maternal uncle.  Past Surgical History:  Procedure Laterality Date  . CIRCUMCISION    . CYST EXCISION Left 2015   arm  . deviated septrum  2003  . JOINT REPLACEMENT Left 2016   Hip  . PROSTATE SURGERY     prostatectomy due to cancer  . PROSTATECTOMY  2013  . TONSILLECTOMY AND ADENOIDECTOMY  2004  . TOTAL HIP ARTHROPLASTY Left 2016  . TOTAL HIP ARTHROPLASTY Right 11/27/2017   Procedure: TOTAL HIP ARTHROPLASTY ANTERIOR APPROACH;  Surgeon: Hessie Knows, MD;  Location: [facility] ORS;  Service: Orthopedics;  Laterality: Right;   Active Ambulatory Problems    Diagnosis Date Noted  . Diabetes mellitus associated with hormonal etiology ([place]) 01/18/2018  . Hyperlipidemia associated with type 2 diabetes mellitus ([place])   . Hypertension associated with diabetes ([place])   . Sleep apnea 04/26/2015  . Gout 04/26/2015  . History of prostate cancer 04/26/2015  . 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 BMI 40.0-44.9, adult ([place]) 04/26/2015  . Cervical spondylosis 02/15/2017  . Advanced care planning/counseling discussion 08/15/2017  . Personal history of prostate cancer 11/14/2017  . Status post total hip replacement, right 11/27/2017  . Morbid obesity ([place]) 01/18/2018  . Presence of  insulin pump 01/18/2018  . Skin lesion 07/02/2019  . Asymptomatic microscopic hematuria 09/08/2019  . Type 2 diabetes mellitus with both eyes affected by mild nonproliferative retinopathy without macular edema, with long-term current use of insulin ([place]) 05/01/2020  . Aortic atherosclerosis ([place]) 05/01/2020  . Neck pain 05/04/2020  . Spinal stenosis in cervical region 06/03/2020  . Cervical radicular pain 06/03/2020  . Chronic pain syndrome 06/03/2020   Resolved Ambulatory Problems    Diagnosis Date Noted  . Hematuria, gross 12/21/2014   Past Medical History:  Diagnosis Date  . Cancer ([place]) 2013  . Diabetes mellitus without complication ([place])   . Hyperlipidemia   . Hypertension    Constitutional Exam  General appearance: Well nourished, well developed, and well hydrated. In no apparent acute distress Vitals:   06/03/20 0833  BP: 140/78  Pulse: 68  Temp: 97.8 F (36.6 C)  SpO2: 100%  Weight: (!) 327 lb (148.3 kg)  Height: 6' 3"  (1.905 m)   BMI Assessment: Estimated body mass index is 40.87 kg/m as calculated from the following:   Height as of this encounter: 6' 3"  (1.905 m).   Weight as of this encounter: 327 lb (148.3 kg).  BMI interpretation table: BMI level Category Range association with higher incidence of chronic pain  <18 kg/m2  Underweight   18.5-24.9 kg/m2 Ideal body weight   25-29.9 kg/m2 Overweight Increased incidence by 20%  30-34.9 kg/m2 Obese (Class I) Increased incidence by 68%  35-39.9 kg/m2 Severe obesity (Class II) Increased incidence by 136%  >40 kg/m2 Extreme obesity (Class III) Increased incidence by 254%   Patient's current BMI Ideal Body weight  Body mass index is 40.87 kg/m. Ideal body weight: 84.5 kg (186 lb 4.6 oz) Adjusted ideal body weight: 110 kg (242 lb 9.2 oz)   BMI Readings from Last 4 Encounters:  06/03/20 40.87 kg/m  05/04/20 43.55 kg/m  02/03/20 40.75 kg/m  01/14/20 40.87 kg/m   Wt Readings from Last 4 Encounters:   06/03/20 (!) 327 lb (148.3 kg)  05/04/20 (!) 328 lb 9.6 oz (149.1 kg)  02/03/20 (!) 326 lb (147.9 kg)  01/14/20 (!) 327 lb (148.3 kg)    Psych/Mental status: Alert, oriented x 3 (person, place, & time)       Eyes: PERLA Respiratory: No evidence of acute respiratory distress  Cervical Spine Exam  Skin & Axial Inspection: No masses, redness, edema, swelling, or associated skin lesions Alignment: Symmetrical Functional ROM: Pain restricted ROM, bilaterally Stability: No instability detected Muscle Tone/Strength: Functionally intact. No obvious neuro-muscular anomalies detected. Sensory (Neurological): Neuropathic pain pattern Palpation: No palpable anomalies              Upper Extremity (UE) Exam    Side: Right upper extremity  Side: Left upper extremity  Skin & Extremity Inspection: Skin color, temperature, and hair growth are WNL. No peripheral edema or cyanosis. No masses, redness, swelling, asymmetry, or associated skin lesions. No contractures.  Skin & Extremity Inspection: Skin color, temperature, and hair growth are WNL. No peripheral edema or cyanosis. No masses, redness, swelling, asymmetry, or associated skin lesions. No contractures.  Functional ROM: Unrestricted ROM          Functional ROM: Unrestricted ROM          Muscle Tone/Strength: Functionally intact. No obvious neuro-muscular anomalies detected.   Muscle Tone/Strength: Functionally intact. No obvious neuro-muscular anomalies detected.  Sensory (Neurological): Unimpaired          Sensory (Neurological): Unimpaired          Palpation: No palpable anomalies              Palpation: No palpable anomalies              Provocative Test(s):  Phalen's test: deferred Tinel's test: deferred Apley's scratch test (touch opposite shoulder):  Action 1 (Across chest): deferred Action 2 (Overhead): deferred Action 3 (LB reach): deferred   Provocative Test(s):  Phalen's test: deferred Tinel's test: deferred Apley's scratch test  (touch opposite shoulder):  Action 1 (Across chest): deferred Action 2 (Overhead): deferred Action 3 (LB reach): deferred   5 out of 5 strength bilateral upper extremity: Shoulder abduction, elbow flexion, elbow extension, thumb extension.  Thoracic Spine Area Exam  Skin & Axial Inspection: No masses, redness, or swelling Alignment: Symmetrical Functional ROM: Unrestricted ROM Stability: No instability detected Muscle Tone/Strength: Functionally intact. No obvious neuro-muscular anomalies detected. Sensory (Neurological): Musculoskeletal pain pattern  Lumbar Exam  Skin & Axial Inspection: No masses, redness, or swelling Alignment: Symmetrical Functional ROM: Unrestricted ROM       Stability: No instability detected Muscle Tone/Strength: Functionally intact. No obvious neuro-muscular anomalies detected. Sensory (Neurological): Unimpaired  *(Flexion, ABduction and External Rotation)  Gait & Posture Assessment  Ambulation: Unassisted Gait: Relatively normal for age and body habitus Posture: WNL  Lower Extremity Exam    Side: Right lower extremity  Side: Left lower extremity  Stability: No instability observed          Stability: No instability observed          Skin & Extremity Inspection: Skin color, temperature, and hair growth are WNL. No peripheral edema or cyanosis. No masses, redness, swelling, asymmetry, or associated skin lesions. No contractures.  Skin & Extremity Inspection: Skin color, temperature, and hair growth are WNL. No peripheral edema or cyanosis. No masses, redness, swelling, asymmetry, or associated skin lesions. No contractures.  Functional ROM: Unrestricted ROM                  Functional ROM: Unrestricted ROM                  Muscle Tone/Strength: Functionally intact. No obvious neuro-muscular anomalies detected.  Muscle Tone/Strength: Functionally intact. No obvious neuro-muscular anomalies detected.  Sensory (Neurological): Unimpaired        Sensory  (Neurological): Unimpaired        DTR: Patellar: deferred today Achilles: deferred today Plantar: deferred today  DTR: Patellar: deferred today Achilles: deferred today Plantar: deferred today  Palpation: No palpable anomalies  Palpation: No palpable anomalies   Assessment  Primary Diagnosis & Pertinent Problem List: The primary encounter diagnosis was Spinal stenosis in cervical region. Diagnoses of Cervical radicular pain, Cervical facet joint syndrome, Cervical spondylosis, and Chronic pain syndrome were also pertinent to this visit.  Visit Diagnosis (New problems to examiner): 1. Spinal stenosis in cervical region   2. Cervical radicular pain   3. Cervical facet joint syndrome   4. Cervical spondylosis   5. Chronic pain syndrome    Plan of Care (Initial workup plan)    1. Severe cervical spinal stenosis without associated myelomalacia: Reviewed patient's cervical MRI.  It is fortunate that the patient is not having any significant weakness in his upper extremity.  He is not dropping any utensils or having any issues with fine motor control although he has significantly limited range of motion of his neck.  Recommend gabapentin trial as below.  Future considerations could include Lyrica or Cymbalta. 2.  Cervical radicular pain: Consider cervical epidural steroid injection 3.  Cervicogenic headaches: Consider diagnostic cervical facet medial branch nerve blocks at C2-C3, C3-C4 4.  Trial of tizanidine for cervical paraspinal muscle spasm/pain.  Patient counseled on risk of sedation and instructed to take at night 1 hour apart from gabapentin. 5.  Follow-up in 6 weeks to discuss neck steps which may include injections if patient is not receiving analgesic benefits on regimen below.  Pharmacotherapy (current): Medications ordered:  Meds ordered this encounter  Medications  . gabapentin (NEURONTIN) 300 MG capsule    Sig: Take 1 capsule (300 mg total) by mouth at bedtime for 15 days,  THEN 2 capsules (600 mg total) at bedtime.    Dispense:  75 capsule    Refill:  0  . tiZANidine (ZANAFLEX) 4 MG tablet    Sig: Take 0.5-1 tablets (2-4 mg total) by mouth at bedtime as needed for muscle spasms.    Dispense:  30 tablet    Refill:  1   Medications administered during this visit: [name] had no medications administered during this visit.   Pharmacological management options:  Opioid Analgesics: The patient was informed that there is no guarantee that he would be a candidate for opioid analgesics. The decision will be made following CDC guidelines. This decision  will be based on the results of diagnostic studies, as well as Aaron [name] risk profile.   Membrane stabilizer: Gabapentin trial.  Consider Lyrica or Cymbalta in future  Muscle relaxant: Failed Flexeril.  Trial of tizanidine.  Consider Robaxin.  NSAID: Tried and failed Celebrex, naproxen, ibuprofen, Mobic  Other analgesic(s): To be determined at a later time   Interventional management options: Aaron [name] was informed that there is no guarantee that he would be a candidate for interventional therapies. The decision will be based on the results of diagnostic studies, as well as Aaron [name] risk profile.  Procedure(s) under consideration:  Cervical epidural steroid injection Cervical facet medial branch nerve blocks for cervicogenic headaches Bilateral occipital nerve block   Provider-requested follow-up: Return in about 6 weeks (around 07/15/2020) for Medication Management, in person.  Future Appointments  Date Time Provider [place]  07/15/2020  9:00 AM Gillis Santa, MD [facility] None  07/16/2020 11:45 AM Bernardo Heater, Ronda Fairly, MD [facility] None  08/04/2020  9:20 AM Marnee Guarneri T, NP [facility]  08/23/2020  9:00 AM [facility]    Note by: Gillis Santa, MD Date: 06/03/2020; Time: 9:51 AM

## 2020-06-03 NOTE — Progress Notes (Signed)
Safety precautions to be maintained throughout the outpatient stay will include: orient to surroundings, keep bed in low position, maintain call bell within reach at all times, provide assistance with transfer out of bed and ambulation.  

## 2020-06-04 ENCOUNTER — Other Ambulatory Visit: Payer: Self-pay

## 2020-06-08 MED ORDER — TAMSULOSIN HCL 0.4 MG PO CAPS: 0.4000 mg | ORAL_CAPSULE | Freq: Every day | ORAL | 4 refills | Status: DC

## 2020-06-21 DIAGNOSIS — E113293 Type 2 diabetes mellitus with mild nonproliferative diabetic retinopathy without macular edema, bilateral: Secondary | ICD-10-CM | POA: Diagnosis not present

## 2020-06-21 DIAGNOSIS — Z9641 Presence of insulin pump (external) (internal): Secondary | ICD-10-CM | POA: Diagnosis not present

## 2020-06-21 DIAGNOSIS — E1169 Type 2 diabetes mellitus with other specified complication: Secondary | ICD-10-CM | POA: Diagnosis not present

## 2020-06-21 DIAGNOSIS — Z794 Long term (current) use of insulin: Secondary | ICD-10-CM | POA: Diagnosis not present

## 2020-06-21 DIAGNOSIS — E669 Obesity, unspecified: Secondary | ICD-10-CM | POA: Diagnosis not present

## 2020-06-23 DIAGNOSIS — E1165 Type 2 diabetes mellitus with hyperglycemia: Secondary | ICD-10-CM | POA: Diagnosis not present

## 2020-06-30 ENCOUNTER — Other Ambulatory Visit: Payer: Self-pay | Admitting: Nurse Practitioner

## 2020-06-30 DIAGNOSIS — I152 Hypertension secondary to endocrine disorders: Secondary | ICD-10-CM

## 2020-07-12 DIAGNOSIS — E1165 Type 2 diabetes mellitus with hyperglycemia: Secondary | ICD-10-CM | POA: Diagnosis not present

## 2020-07-14 ENCOUNTER — Other Ambulatory Visit: Payer: Self-pay | Admitting: Nurse Practitioner

## 2020-07-14 DIAGNOSIS — E1159 Type 2 diabetes mellitus with other circulatory complications: Secondary | ICD-10-CM

## 2020-07-14 DIAGNOSIS — I152 Hypertension secondary to endocrine disorders: Secondary | ICD-10-CM

## 2020-07-14 DIAGNOSIS — E1169 Type 2 diabetes mellitus with other specified complication: Secondary | ICD-10-CM

## 2020-07-14 DIAGNOSIS — E1165 Type 2 diabetes mellitus with hyperglycemia: Secondary | ICD-10-CM | POA: Diagnosis not present

## 2020-07-14 NOTE — Telephone Encounter (Signed)
Requested Prescriptions  Pending Prescriptions Disp Refills  . atorvastatin (LIPITOR) 20 MG tablet [Pharmacy Med Name: ATORVASTATIN CALCIUM 20 MG Tablet] 90 tablet 3    Sig: TAKE 1 TABLET EVERY DAY     Cardiovascular:  Antilipid - Statins Failed - 07/14/2020  6:18 AM      Failed - LDL in normal range and within 360 days    LDL Chol Calc (NIH)  Date Value Ref Range Status  05/04/2020 65 0 - 99 mg/dL Final         Failed - HDL in normal range and within 360 days    HDL  Date Value Ref Range Status  05/04/2020 30 (L) >39 mg/dL Final         Passed - Total Cholesterol in normal range and within 360 days    Cholesterol, Total  Date Value Ref Range Status  05/04/2020 111 100 - 199 mg/dL Final   Cholesterol Piccolo, Waived  Date Value Ref Range Status  03/21/2018 78 <200 mg/dL Final    Comment:                            Desirable                <200                         Borderline High      200- 239                         High                     >239          Passed - Triglycerides in normal range and within 360 days    Triglycerides  Date Value Ref Range Status  05/04/2020 81 0 - 149 mg/dL Final   Triglycerides Piccolo,Waived  Date Value Ref Range Status  03/21/2018 67 <150 mg/dL Final    Comment:                            Normal                   <150                         Borderline High     150 - 199                         High                200 - 499                         Very High                >499          Passed - Patient is not pregnant      Passed - Valid encounter within last 12 months    Recent Outpatient Visits          2 months ago Diabetes mellitus associated with hormonal etiology ([place])   [place], Jolene T, NP   5 months ago Diabetes mellitus associated with hormonal etiology ([place])  [place], Megan P, DO   8 months ago Encounter for annual physical exam   [employer], [place] T, NP   10 months ago Asymptomatic microscopic hematuria   [place], Langdon T, NP   10 months ago Asymptomatic microscopic hematuria   [place], Henrine Screws T, NP      Future Appointments            In 2 days Stoio[place], Ronda Fairly, MD [place]   In 3 weeks Marvel, Barbaraann Faster, NP [name], [facility]           . losartan (COZAAR) 100 MG tablet [Pharmacy Med Name: LOSARTAN POTASSIUM 100 MG Tablet] 90 tablet 1    Sig: TAKE 1 TABLET EVERY DAY     Cardiovascular:  Angiotensin Receptor Blockers Failed - 07/14/2020  6:18 AM      Failed - Last BP in normal range    BP Readings from Last 1 Encounters:  06/03/20 140/78         Passed - Cr in normal range and within 180 days    Creatinine  Date Value Ref Range Status  10/26/2012 1.49 (H) 0.60 - 1.30 mg/dL Final   Creatinine, Ser  Date Value Ref Range Status  05/04/2020 1.17 0.76 - 1.27 mg/dL Final         Passed - K in normal range and within 180 days    Potassium  Date Value Ref Range Status  05/04/2020 4.4 3.5 - 5.2 mmol/L Final  10/26/2012 3.9 3.5 - 5.1 mmol/L Final         Passed - Patient is not pregnant      Passed - Valid encounter within last 6 months    Recent Outpatient Visits          2 months ago Diabetes mellitus associated with hormonal etiology ([place])   [place], Jolene T, NP   5 months ago Diabetes mellitus associated with hormonal etiology ([place])   [place], Megan P, DO   8 months ago Encounter for annual physical exam   [employer], [place] T, NP   10 months ago Asymptomatic microscopic hematuria   [place], [place] T, NP   10 months ago Asymptomatic microscopic hematuria   [place], Barbaraann Faster, NP      Future Appointments            In 2 days Stoioff, Ronda Fairly, MD [place]   In 3  weeks [place], Barbaraann Faster, NP [employer], [facility]

## 2020-07-15 ENCOUNTER — Encounter: Payer: Self-pay | Admitting: Student in an Organized Health Care Education/Training Program

## 2020-07-15 ENCOUNTER — Ambulatory Visit
Payer: Medicare HMO | Attending: Student in an Organized Health Care Education/Training Program | Admitting: Student in an Organized Health Care Education/Training Program

## 2020-07-15 ENCOUNTER — Other Ambulatory Visit: Payer: Self-pay

## 2020-07-15 VITALS — BP 135/61 | HR 69 | Temp 97.5°F | Resp 16 | Ht 75.0 in | Wt 325.0 lb

## 2020-07-15 DIAGNOSIS — G894 Chronic pain syndrome: Secondary | ICD-10-CM | POA: Insufficient documentation

## 2020-07-15 DIAGNOSIS — M4802 Spinal stenosis, cervical region: Secondary | ICD-10-CM | POA: Diagnosis not present

## 2020-07-15 DIAGNOSIS — M5412 Radiculopathy, cervical region: Secondary | ICD-10-CM | POA: Diagnosis not present

## 2020-07-15 DIAGNOSIS — M47812 Spondylosis without myelopathy or radiculopathy, cervical region: Secondary | ICD-10-CM | POA: Insufficient documentation

## 2020-07-15 MED ORDER — GABAPENTIN 600 MG PO TABS: 600.0000 mg | ORAL_TABLET | Freq: Every evening | ORAL | 3 refills | Status: DC

## 2020-07-15 MED ORDER — METHOCARBAMOL 750 MG PO TABS: 750.0000 mg | ORAL_TABLET | Freq: Two times a day (BID) | ORAL | 0 refills | Status: DC | PRN

## 2020-07-15 NOTE — Progress Notes (Signed)
PROVIDER NOTE: Information contained herein reflects review and annotations entered in association with encounter. Interpretation of such information and data should be left to medically-trained personnel. Information provided to patient can be located elsewhere in the medical record under "Patient Instructions". Document created using STT-dictation technology, any transcriptional errors that may result from process are unintentional.    Patient: Aaron [name]  Service Category: E/M  Provider: Gillis Santa, MD  DOB: [birth year]  DOS: 07/15/2020  Specialty: Interventional Pain Management  MRN: [id number]  Setting: Ambulatory outpatient  PCP: Venita Lick, NP  Type: Established Patient    Referring Provider: Venita Lick, NP  Location: Office  Delivery: Face-to-face     HPI  Mr. DESMON HITCHNER, a 70 y.o. year old male, is here today because of his Spinal stenosis in cervical region [M48.02]. Mr. Dino primary complain today is Neck Pain Last encounter: My last encounter with him was on 06/03/2020. Pertinent problems: Mr. Ledgerwood has Hyperlipidemia associated with type 2 diabetes mellitus ([place]); S/P hip replacement; Status post total hip replacement, right; Neck pain; Spinal stenosis in cervical region; Cervical radicular pain; and Chronic pain syndrome on their pertinent problem list. Pain Assessment: Severity of Chronic pain is reported as a 3 /10. Location: Neck Lower/pain radiaties down left side at times. Onset: More than a month ago. Quality: Throbbing,Constant. Timing: Constant. Modifying factor(s): meds. Vitals:  height is 6' 3"  (1.905 m) and weight is 325 lb (147.4 kg) (abnormal). His temporal temperature is 97.5 F (36.4 C) (abnormal). His blood pressure is 135/61 and his pulse is 69. His respiration is 16 and oxygen saturation is 99%.   Reason for encounter: medication management.    Patient presents today for medication management. No significant change in his medical  history since her last visit. He states that the gabapentin is helping to manage his pain and is also helping with sleep. He is taking 600 mg at night without any side effects. He does not find much relief from the tizanidine when he has muscle spasms. We discussed transitioning to Robaxin and seeing if a trial that was more effective. Otherwise I recommend he consider increasing his gabapentin to 900 mg nightly. If worsening cervical pain, have discussed diagnostic cervical facet medial branch nerve blocks and potentially therapeutic radiofrequency ablation. Overall neck pain is well managed so do not need to pursue interventional treatments at this time left certainly an option for Mr Canterbury.  "HPI 06/03/20: Jarnigan is a pleasant 70 year old male who presents with a chief complaint of neck pain that radiates to bilateral shoulders as well as posterior dominant occipital headaches.  Patient has been evaluated by neurosurgery.  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  He is a English as a second language teacher.  He has tried physical therapy which was not helpful.  He says he takes his grandchildren to school so would like to avoid anything that can be sedating during the day.  He would like to avoid opioid analgesics.  He has tried various anti-inflammatories in the past including Celebrex, Aleve, Mobic, naproxen with limited benefit.  He denies having trialed gabapentin, Lyrica, Cymbalta.  He has tried Flexeril but not Robaxin or tizanidine.  He has not had any sort of injections.  He states that he has limited ability to look right or left given severe pain.  He has also limited cervical extension.  No weakness of  the upper extremity."  ROS  Constitutional: Denies any fever or chills Gastrointestinal: No reported hemesis, hematochezia, vomiting, or acute GI distress Musculoskeletal: neck  pain hip pain Neurological: No reported episodes of acute onset apraxia, aphasia, dysarthria, agnosia, amnesia, paralysis, loss of coordination, or loss of consciousness  Medication Review  Aspirin, acetaminophen, allopurinol, amLODipine, atorvastatin, carvedilol, gabapentin, glucose blood, hydrochlorothiazide, insulin aspart, insulin pump, losartan, methocarbamol, and tamsulosin  History Review  Allergy: Mr. Flenner is allergic to glucophage [metformin]. Drug: Mr. Bordonaro  reports no history of drug use. Alcohol:  reports no history of alcohol use. Tobacco:  reports that he has never smoked. He has never used smokeless tobacco. Social: Mr. Doolin  reports that he has never smoked. He has never used smokeless tobacco. He reports that he does not drink alcohol and does not use drugs. Medical:  has a past medical history of Cancer ([place]) (2013), Diabetes mellitus without complication ([place]), Gout, Hyperlipidemia, Hypertension, and Sleep apnea. Surgical: Mr. Siemon  has a past surgical history that includes deviated septrum (2003); Tonsillectomy and adenoidectomy (2004); Prostate surgery; Circumcision; Cyst excision (Left, 2015); Joint replacement (Left, 2016); Total hip arthroplasty (Left, 2016); Prostatectomy (2013); and Total hip arthroplasty (Right, 11/27/2017). Family: family history includes Breast cancer in his mother; Cancer in his maternal uncle, mother, and sister; Stroke in his father and maternal uncle.  Laboratory Chemistry Profile   Renal Lab Results  Component Value Date   BUN 17 05/04/2020   CREATININE 1.17 05/04/2020   BCR 15 05/04/2020   GFRAA 74 05/04/2020   GFRNONAA 64 05/04/2020     Hepatic Lab Results  Component Value Date   AST 12 10/31/2019   ALT 12 10/31/2019   ALBUMIN 4.2 10/31/2019   ALKPHOS 145 (H) 10/31/2019     Electrolytes Lab Results  Component Value Date   NA 143 05/04/2020   K 4.4 05/04/2020   CL 105 05/04/2020   CALCIUM 9.3 05/04/2020      Bone No results found for: VD25OH, VD125OH2TOT, QI2979GX2, JJ9417EY8, 25OHVITD1, 25OHVITD2, 25OHVITD3, TESTOFREE, TESTOSTERONE   Inflammation (CRP: Acute Phase) (ESR: Chronic Phase) Lab Results  Component Value Date   ESRSEDRATE 43 (H) 11/14/2017       Note: Above Lab results reviewed.  Recent Imaging Review  CT HEMATURIA WORKUP CLINICAL DATA:  70 year old male with history of 3 days of gross hematuria 2 weeks ago. History of prostate cancer.  EXAM: CT ABDOMEN AND PELVIS WITHOUT AND WITH CONTRAST  TECHNIQUE: Multidetector CT imaging of the abdomen and pelvis was performed following the standard protocol before and following the bolus administration of intravenous contrast.  CONTRAST:  140m OMNIPAQUE IOHEXOL 300 MG/ML  SOLN  COMPARISON:  CT the abdomen and pelvis 01/02/2018.  FINDINGS: Lower chest: Atherosclerotic calcifications in the left anterior descending and right coronary arteries.  Hepatobiliary: Mild diffuse low attenuation throughout the hepatic parenchyma, indicative of hepatic steatosis. No suspicious cystic or solid hepatic lesions. No intra or extrahepatic biliary ductal dilatation. 6 mm calcified gallstone lying dependently in the neck of the gallbladder. No findings to suggest an acute cholecystitis at this time.  Pancreas: No pancreatic mass. No pancreatic ductal dilatation. No pancreatic or peripancreatic fluid collections or inflammatory changes.  Spleen: Unremarkable.  Adrenals/Urinary Tract: There are 2 tiny nonobstructive calculi in the right renal collecting system, largest of which measures only 3 mm in the interpolar region. No additional calculi are identified within the left renal collecting system, along the course of either ureter or within the lumen of the  urinary bladder. No hydroureteronephrosis. No suspicious renal lesions. Postcontrast delayed images demonstrate no definite filling defect within the collecting system of either  kidney, along the course of either ureter, or within the lumen of the urinary bladder to strongly suggest the presence of urothelial neoplasm at this time. Urinary bladder is nearly decompressed, but otherwise unremarkable in appearance.  Stomach/Bowel: Normal appearance of the stomach. No pathologic dilatation of small bowel or colon. A few scattered colonic diverticulae are noted, without surrounding inflammatory changes to suggest an acute diverticulitis at this time. Normal appendix.  Vascular/Lymphatic: Aortic atherosclerosis. No lymphadenopathy noted in the abdomen or pelvis.  Reproductive: Status post radical prostatectomy.  Other: No significant volume of ascites.  No pneumoperitoneum.  Musculoskeletal: Cavernous hemangioma of L1 vertebral body, similar to the prior study. Status post bilateral hip arthroplasty.  IMPRESSION: 1. Small nonobstructive calculi in the collecting system of the right kidney measuring up to 3 mm in the interpolar region. No ureteral stones or findings of urinary tract obstruction are noted at this time. 2. No definite evidence of metastatic disease in this patient with history of prostate cancer. 3. Mild hepatic steatosis. 4. Cholelithiasis without evidence of acute cholecystitis. 5. Aortic atherosclerosis, in addition to 2 vessel coronary artery disease. Please note that although the presence of coronary artery calcium documents the presence of coronary artery disease, the severity of this disease and any potential stenosis cannot be assessed on this non-gated CT examination. Assessment for potential risk factor modification, dietary therapy or pharmacologic therapy may be warranted, if clinically indicated. 6. Additional incidental findings, similar to the prior study, as above.  Electronically Signed   By: Vinnie Langton M.D.   On: 09/17/2019 08:57 MR CERVICAL SPINE WO CONTRAST CLINICAL DATA:  Neck pain with bilateral shoulder  pain  EXAM: MRI CERVICAL SPINE WITHOUT CONTRAST  TECHNIQUE: Multiplanar, multisequence MR imaging of the cervical spine was performed. No intravenous contrast was administered.  COMPARISON:  None.  FINDINGS: Alignment: Straightening of the cervical spine  Vertebrae: No fracture, evidence of discitis, or bone lesion.  Cord: Flattening at C2-3 and C3-4, without detected cord signal abnormality.  Posterior Fossa, vertebral arteries, paraspinal tissues: Negative.  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 This ossification was also seen on a 2018 maxillofacial CT. Uncovertebral spurring with patent foramina  C3-4: Central disc protrusion, endplate ridging, and ligamentum flavum thickening with 4 mm AP canal diameter in the midline. High-grade left foraminal narrowing.  C4-5: Ventral bridging spondylosis.  No visible impingement  C5-6: Ventral bridging spondylosis.  No visible impingement  C6-7: Ventral bridging spondylosis.  No impingement  C7-T1:Ventral bridging spondylosis.  T1-2: Degenerative facet spurring asymmetric to the right, with foraminal narrowing.  IMPRESSION: 1. C2-3 and C3-4 high-grade spinal stenosis with cord flattening. 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 2. C3-4 left foraminal impingement. 3. C4-T1 ankylosis from ventral flowing osteophytes. 4. 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  Electronically Signed   By: Monte Fantasia M.D.   On: 09/17/2019 07:18 Note: Reviewed        Physical Exam  General appearance: Well nourished, well developed, and well hydrated. In no apparent acute distress Mental status: Alert, oriented x 3 (person, place, & time)       Respiratory: No evidence of acute respiratory distress Eyes: PERLA Vitals:  BP 135/61 (BP  Location: Left Arm, Patient Position: Sitting, Cuff Size: Large)   Pulse 69   Temp (!) 97.5 F (36.4 C) (Temporal)   Resp 16   Ht 6' 3"  (1.905 m)   Wt (!) 325 lb (147.4 kg)   SpO2 99%   BMI 40.62 kg/m  BMI: Estimated body mass index is 40.62 kg/m as calculated from the following:   Height as of this encounter: 6' 3"  (1.905 m).   Weight as of this encounter: 325 lb (147.4 kg). Ideal: Ideal body weight: 84.5 kg (186 lb 4.6 oz) Adjusted ideal body weight: 109.7 kg (241 lb 12.4 oz)  Cervical neck pain, slight increase with cervical extension. Bilateral hip pain.   Assessment   Status Diagnosis  Controlled Controlled Controlled 1. Spinal stenosis in cervical region   2. Cervical radicular pain   3. Cervical facet joint syndrome   4. Cervical spondylosis   5. Chronic pain syndrome      Updated Problems: Problem  Spinal Stenosis in Cervical Region  Cervical Radicular Pain  Chronic Pain Syndrome  Neck Pain  Status Post Total Hip Replacement, Right  S/P Hip Replacement   Nov 2016 lt hip   Hyperlipidemia Associated With Type 2 Diabetes Mellitus ([facility])    Plan of Care  Mr. JAKOLBY SEDIVY has a current medication list which includes the following long-term medication(s): allopurinol, amlodipine, atorvastatin, carvedilol, gabapentin, hydrochlorothiazide, and losartan.  Pharmacotherapy (Medications Ordered): Meds ordered this encounter  Medications  . DISCONTD: gabapentin (NEURONTIN) 600 MG tablet    Sig: Take 1-1.5 tablets (600-900 mg total) by mouth at bedtime.    Dispense:  75 tablet    Refill:  3  . methocarbamol (ROBAXIN) 750 MG tablet    Sig: Take 1 tablet (750 mg total) by mouth every 12 (twelve) hours as needed for muscle spasms.    Dispense:  90 tablet    Refill:  0    Do not place this medication, or any other prescription from our practice, on "Automatic Refill". Patient may have prescription filled one day early if pharmacy is closed on  scheduled refill date.  . gabapentin (NEURONTIN) 600 MG tablet    Sig: Take 1-1.5 tablets (600-900 mg total) by mouth at bedtime.    Dispense:  75 tablet    Refill:  3    Follow-up plan:   Return in about 4 months (around 11/12/2020) for Medication Management, in person.   Recent Visits Date Type Provider Dept  06/03/20 Office Visit Gillis Santa, MD [facility]  Showing recent visits within past 90 days and meeting all other requirements Today's Visits Date Type Provider Dept  07/15/20 Office Visit Gillis Santa, MD [facility]  Showing today's visits and meeting all other requirements Future Appointments No visits were found meeting these conditions. Showing future appointments within next 90 days and meeting all other requirements  I discussed the assessment and treatment plan with the patient. The patient was provided an opportunity to ask questions and all were answered. The patient agreed with the plan and demonstrated an understanding of the instructions.  Patient advised to call back or seek an in-person evaluation if the symptoms or condition worsens.  Duration of encounter:20 minutes.  Note by: Gillis Santa, MD Date: 07/15/2020; Time: 9:43 AM

## 2020-07-15 NOTE — Progress Notes (Signed)
Nursing Pain Medication Assessment:  Safety precautions to be maintained throughout the outpatient stay will include: orient to surroundings, keep bed in low position, maintain call bell within reach at all times, provide assistance with transfer out of bed and ambulation.  Medication Inspection Compliance: Aaron [name] did not comply with our request to bring his pills to be counted. He was reminded that bringing the medication bottles, even when empty, is a requirement.  Medication: None brought in. Pill/Patch Count: None available to be counted. Bottle Appearance: No container available. Did not bring bottle(s) to appointment. Filled Date: N/A Last Medication intake:  Today

## 2020-07-15 NOTE — Progress Notes (Signed)
Safety precautions to be maintained throughout the outpatient stay will include: orient to surroundings, keep bed in low position, maintain call bell within reach at all times, provide assistance with transfer out of bed and ambulation.  

## 2020-07-16 ENCOUNTER — Ambulatory Visit: Payer: Medicare HMO | Admitting: Urology

## 2020-07-16 ENCOUNTER — Other Ambulatory Visit: Payer: Self-pay

## 2020-07-16 ENCOUNTER — Encounter: Payer: Self-pay | Admitting: Urology

## 2020-07-16 VITALS — BP 150/77 | HR 78 | Ht 75.0 in | Wt 325.0 lb

## 2020-07-16 DIAGNOSIS — Z87898 Personal history of other specified conditions: Secondary | ICD-10-CM

## 2020-07-16 DIAGNOSIS — Z8546 Personal history of malignant neoplasm of prostate: Secondary | ICD-10-CM

## 2020-07-16 DIAGNOSIS — Z87448 Personal history of other diseases of urinary system: Secondary | ICD-10-CM | POA: Diagnosis not present

## 2020-07-16 DIAGNOSIS — N3040 Irradiation cystitis without hematuria: Secondary | ICD-10-CM | POA: Insufficient documentation

## 2020-07-16 DIAGNOSIS — R310 Gross hematuria: Secondary | ICD-10-CM | POA: Diagnosis not present

## 2020-07-16 LAB — URINALYSIS, COMPLETE
Bilirubin, UA: NEGATIVE
Glucose, UA: NEGATIVE
Ketones, UA: NEGATIVE
Leukocytes,UA: NEGATIVE
Nitrite, UA: NEGATIVE
Protein,UA: NEGATIVE
Specific Gravity, UA: 1.02 (ref 1.005–1.030)
Urobilinogen, Ur: 1 mg/dL (ref 0.2–1.0)
pH, UA: 7 (ref 5.0–7.5)

## 2020-07-16 LAB — MICROSCOPIC EXAMINATION
Bacteria, UA: NONE SEEN
WBC, UA: NONE SEEN /hpf (ref 0–5)

## 2020-07-16 NOTE — Progress Notes (Signed)
07/16/2020 11:40 AM   [name] 03-22-1951 [id number]  Referring provider: Venita Lick, NP [address] [place],  [place] [address]  Chief Complaint  Patient presents with  . Follow-up    34mth follow-up    HPI: 70 y.o. male presents for 57-month follow-up.   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 Cancer [facility]) 2013   prostate (radiation and surgery)  . Diabetes mellitus without complication ([place])   . Gout   . Hyperlipidemia   . Hypertension   . Sleep apnea     Surgical History: Past Surgical History:  Procedure Laterality Date  . CIRCUMCISION    . CYST EXCISION Left 2015   arm  . deviated septrum  2003  . JOINT REPLACEMENT Left 2016   Hip  . PROSTATE SURGERY     prostatectomy due to cancer  . PROSTATECTOMY  2013  . TONSILLECTOMY AND ADENOIDECTOMY  2004  . TOTAL HIP ARTHROPLASTY Left 2016  . TOTAL HIP ARTHROPLASTY Right 11/27/2017   Procedure: TOTAL HIP ARTHROPLASTY ANTERIOR APPROACH;  Surgeon: Hessie Knows, MD;  Location: [facility] ORS;  Service: Orthopedics;  Laterality: Right;    Home Medications:  Allergies as of 07/16/2020      Reactions   Glucophage [metformin] Diarrhea      Medication List       Accurate as of July 16, 2020 11:40 AM. If you have any questions, ask your nurse or doctor.        Accu-Chek Guide test strip Generic drug: glucose blood   acetaminophen 500 MG tablet Commonly known as: TYLENOL Take 1-2 tablets (500-1,000 mg total) by mouth every 6 (six) hours as needed for mild pain (pain score 1-3 or temp > 100.5).   allopurinol 300 MG tablet Commonly known as: ZYLOPRIM TAKE 1 TABLET EVERY DAY   amLODipine 10 MG tablet Commonly known as: NORVASC TAKE 1 TABLET EVERY DAY   ASPIRIN 81  PO every evening.   atorvastatin 20 MG tablet Commonly known as: LIPITOR TAKE 1 TABLET EVERY DAY   carvedilol 25 MG tablet Commonly known as: COREG Take 1 tablet (25 mg total) by mouth 2 (two) times daily with a meal.   gabapentin 600 MG tablet Commonly known as: Neurontin Take 1-1.5 tablets (600-900 mg total) by mouth at bedtime.   hydrochlorothiazide 25 MG tablet Commonly known as: HYDRODIURIL TAKE 1 TABLET EVERY DAY   insulin aspart 100 UNIT/ML injection Commonly known as: novoLOG Inject into the skin. Using via insulin pump  Medtronic MiniMed 630G insulin pump   insulin pump Soln Inject into the skin.   losartan 100 MG tablet Commonly known as: COZAAR TAKE 1 TABLET EVERY DAY   methocarbamol 750 MG tablet Commonly known as: ROBAXIN Take 1 tablet (750 mg total) by mouth every 12 (twelve) hours as needed for muscle spasms.   tamsulosin 0.4 MG Caps capsule Commonly known as: FLOMAX Take 1 capsule (0.4 mg total) by mouth daily.       Allergies:  Allergies  Allergen Reactions  . Glucophage [Metformin] Diarrhea    Family History: Family History  Problem Relation Age of Onset  . Cancer Mother   . Breast cancer Mother   . Stroke Father   . Cancer Sister   . Cancer  Maternal Uncle   . Stroke Maternal Uncle     Social History:  reports that he has never smoked. He has never used smokeless tobacco. He reports that he does not drink alcohol and does not use drugs.   Physical Exam: BP (!) 150/77   Pulse 78   Ht 6\' 3"  (1.905 m)   Wt (!) 325 lb (147.4 kg)   BMI 40.62 kg/m   Constitutional:  Alert and oriented, No acute distress. HEENT: [place] AT, moist mucus membranes.  Trachea midline, no masses. Cardiovascular: No clubbing, cyanosis, or edema. Respiratory: Normal respiratory effort, no increased work of breathing.  Laboratory Data:  Urinalysis Dipstick/microscopy negative  Assessment & Plan:    1.  History gross hematuria  No recurrent  episodes  Urinalysis today is clear  Follow-up 1 year  2.  Radiation cystitis  3.  History of prostate cancer   Abbie Sons, MD  [facility] [address] [place], [place] [address] [phone]

## 2020-07-21 DIAGNOSIS — E1165 Type 2 diabetes mellitus with hyperglycemia: Secondary | ICD-10-CM | POA: Diagnosis not present

## 2020-08-04 ENCOUNTER — Encounter: Payer: Self-pay | Admitting: Nurse Practitioner

## 2020-08-04 ENCOUNTER — Other Ambulatory Visit: Payer: Self-pay

## 2020-08-04 ENCOUNTER — Ambulatory Visit (INDEPENDENT_AMBULATORY_CARE_PROVIDER_SITE_OTHER): Payer: Medicare HMO | Admitting: Nurse Practitioner

## 2020-08-04 VITALS — BP 115/68 | HR 65 | Temp 98.6°F | Wt 327.6 lb

## 2020-08-04 DIAGNOSIS — Z9641 Presence of insulin pump (external) (internal): Secondary | ICD-10-CM

## 2020-08-04 DIAGNOSIS — E1169 Type 2 diabetes mellitus with other specified complication: Secondary | ICD-10-CM | POA: Diagnosis not present

## 2020-08-04 DIAGNOSIS — E1159 Type 2 diabetes mellitus with other circulatory complications: Secondary | ICD-10-CM

## 2020-08-04 DIAGNOSIS — Z6841 Body Mass Index (BMI) 40.0 and over, adult: Secondary | ICD-10-CM

## 2020-08-04 DIAGNOSIS — E113293 Type 2 diabetes mellitus with mild nonproliferative diabetic retinopathy without macular edema, bilateral: Secondary | ICD-10-CM | POA: Diagnosis not present

## 2020-08-04 DIAGNOSIS — Z794 Long term (current) use of insulin: Secondary | ICD-10-CM | POA: Diagnosis not present

## 2020-08-04 DIAGNOSIS — G4733 Obstructive sleep apnea (adult) (pediatric): Secondary | ICD-10-CM | POA: Diagnosis not present

## 2020-08-04 DIAGNOSIS — I700 Atherosclerosis of aorta: Secondary | ICD-10-CM

## 2020-08-04 DIAGNOSIS — E1129 Type 2 diabetes mellitus with other diabetic kidney complication: Secondary | ICD-10-CM

## 2020-08-04 DIAGNOSIS — E785 Hyperlipidemia, unspecified: Secondary | ICD-10-CM | POA: Diagnosis not present

## 2020-08-04 DIAGNOSIS — M4802 Spinal stenosis, cervical region: Secondary | ICD-10-CM | POA: Diagnosis not present

## 2020-08-04 DIAGNOSIS — Z8546 Personal history of malignant neoplasm of prostate: Secondary | ICD-10-CM

## 2020-08-04 DIAGNOSIS — R3121 Asymptomatic microscopic hematuria: Secondary | ICD-10-CM

## 2020-08-04 DIAGNOSIS — I152 Hypertension secondary to endocrine disorders: Secondary | ICD-10-CM

## 2020-08-04 DIAGNOSIS — M1A0720 Idiopathic chronic gout, left ankle and foot, without tophus (tophi): Secondary | ICD-10-CM

## 2020-08-04 DIAGNOSIS — R809 Proteinuria, unspecified: Secondary | ICD-10-CM

## 2020-08-04 LAB — MICROALBUMIN, URINE WAIVED
Creatinine, Urine Waived: 300 mg/dL (ref 10–300)
Microalb, Ur Waived: 30 mg/L — ABNORMAL HIGH (ref 0–19)
Microalb/Creat Ratio: <30 mg/g (ref <30)

## 2020-08-04 NOTE — Assessment & Plan Note (Signed)
Chronic, stable.  Continue current medication regimen and adjust as needed.  Uric acid level next visit.  May need to discontinue HCTZ in future if return of flares presents.

## 2020-08-04 NOTE — Assessment & Plan Note (Signed)
Continue to collaborate with endocrinology. 

## 2020-08-04 NOTE — Progress Notes (Addendum)
BP 115/68   Pulse 65   Temp 98.6 F (37 C) (Oral)   Wt (!) 327 lb 9.6 oz (148.6 kg)   SpO2 98%   BMI 40.95 kg/m    Subjective:    Patient ID: Aaron [name], male    DOB: [birth year], 70 y.o.   MRN: [id number]  HPI: Aaron [name] is a 70 y.o. male  Chief Complaint  Patient presents with  . Diabetes    Patient denies having any problems or concerns at today's visit.  Marland Kitchen Hypertension  . Hyperlipidemia   DIABETES Is followed by Dr. Gabriel Carina and last saw 06/21/20. A1C at that time was 6.7%. Uses insulin pump, per Dr. Gabriel Carina last note 06/22/19: "He changed from the Medtronic insulin pump to the Tandem T:slim X2 pump in 04/2020. His insulin pump was downloaded and reviewed. Basal rates Correction factor Carb ratio Target BG 12 am 4 units/hr 10 4 110 8:30 am 2 units/hr 10 4 110 11 am 3 units.hr 10 4 110 1 pm 3 units/hr 10 3 110 2 pm 4 units/hr 10 3 110  24-hr basal = 88 units Humalog"  Over last 30 days, he has checked a blood glucose FIVE times daily. Average BG over two weeks = 141 mg/dl. Average carbs entered into the pump daily = 169 g. Average total daily use of insulin = 130 units (basal 55%, bolus 45%). Changes site and fills canula q1.8days.   He is followed by Dr. Matilde Sprang for retinopathy and last seen 03/03/20. Hypoglycemic episodes:no Polydipsia/polyuria:no Visual disturbance:no Chest pain:no Paresthesias:no Glucose Monitoring:yes Accucheck frequency: QID Fasting glucose: as above Post prandial: as above Evening: as above Before meals: as above Taking Insulin?:yes Long acting insulin: Short acting insulin: Blood Pressure Monitoring:not checking Retinal Examination:Up to Date Foot Exam:Up to Date Pneumovax:Up to Date Influenza:Up to Date Aspirin:yes  HYPERTENSION / HYPERLIPIDEMIA Continues on Losartan 100 MG, HCTZ 25 MG daily, Carvedilol 25 MG BID, Amlodipine 10  MG daily. Takes Lipitor 20 MG.  Has OSA and uses CPAP every night.  On 09/16/19 imaging aortic atherosclerosis noted. Satisfied with current treatment?yes Duration of hypertension:chronic BP monitoring frequency:a couple times a day BP range: 130-140/60-70 range BP medication side effects:no Duration of hyperlipidemia:chronic Cholesterol medication side effects:no Cholesterol supplements: none Medication compliance:good compliance Aspirin:yes Recent stressors:no Recurrent headaches:no Visual changes:no Palpitations:no Dyspnea:no Chest pain:no Lower extremity edema:no Dizzy/lightheaded:no  NECK PAIN  Has had neck pain for several years -- is being followed by neurosurgery, last saw Dr. Lacinda Axon on 04/13/20.  Is currently being followed by pain clinic and last saw Dr. Holley Raring on 07/15/20 for chronic pain and spinal stenosis in cervical spine.  Is taking Robaxin & Gabapentin and they are considering doing injections.    Has history of gout, taking Allopurinol --- has not had flare in years -- left great toe. Treatments attempted: rest, ice, heat, APAP and physical therapy  Compliant with recommended treatment: yes Relief with NSAIDs?:  No NSAIDs Taken Location:midline Duration:chronic Severity: moderate Quality: dull and aching Frequency: constant Radiation: none Aggravating factors: lifting, movement and bending Alleviating factors: rest, ice, heat, APAP and muscle relaxer Weakness:  no Paresthesias / decreased sensation:  no  Fevers:  no   HISTORY OF PROSTATE CANCER: Prostate was removed in 2013.  He is currently followed by urology for history of gross hematuria with las visit on 07/16/20.  Cystoscopy last 12/29/2019.  No changes recent visit and is to follow-up in one year.  Continues on Flomax.  Relevant past medical,  surgical, family and social history reviewed and updated as indicated. Interim medical history since our last visit reviewed. Allergies and  medications reviewed and updated.  Review of Systems  Constitutional: Negative for activity change, diaphoresis, fatigue and fever.  Respiratory: Negative for cough, chest tightness, shortness of breath and wheezing.   Cardiovascular: Negative for chest pain, palpitations and leg swelling.  Gastrointestinal: Negative.   Endocrine: Negative for cold intolerance, heat intolerance, polydipsia, polyphagia and polyuria.  Musculoskeletal: Positive for neck pain.  Neurological: Negative.   Psychiatric/Behavioral: Negative.     Per HPI unless specifically indicated above     Objective:    BP 115/68   Pulse 65   Temp 98.6 F (37 C) (Oral)   Wt (!) 327 lb 9.6 oz (148.6 kg)   SpO2 98%   BMI 40.95 kg/m   Wt Readings from Last 3 Encounters:  08/04/20 (!) 327 lb 9.6 oz (148.6 kg)  07/16/20 (!) 325 lb (147.4 kg)  07/15/20 (!) 325 lb (147.4 kg)    Physical Exam Vitals and nursing note reviewed.  Constitutional:      General: He is awake. He is not in acute distress.    Appearance: Normal appearance. He is well-groomed. He is morbidly obese. He is not ill-appearing or toxic-appearing.  HENT:     Head: Normocephalic and atraumatic.     Right Ear: Hearing and external ear normal. No drainage.     Left Ear: Hearing and external ear normal. No drainage.  Eyes:     General: No scleral icterus.       Right eye: No discharge.        Left eye: No discharge.     Pupils: Pupils are equal, round, and reactive to light.  Neck:     Comments: Decreased flexion, extension and rotation. Cardiovascular:     Rate and Rhythm: Normal rate and regular rhythm.     Pulses: Normal pulses.     Heart sounds: Normal heart sounds. No murmur heard. No friction rub. No gallop.   Pulmonary:     Effort: Pulmonary effort is normal. No respiratory distress.     Breath sounds: Normal breath sounds. No stridor. No wheezing, rhonchi or rales.  Chest:     Chest wall: No tenderness.  Musculoskeletal:     Cervical  back: Neck supple. Decreased range of motion.  Skin:    General: Skin is warm and dry.     Capillary Refill: Capillary refill takes less than 2 seconds.     Coloration: Skin is not jaundiced or pale.     Findings: No bruising, erythema, lesion or rash.  Neurological:     General: No focal deficit present.     Mental Status: He is alert and oriented to person, place, and time. Mental status is at baseline.  Psychiatric:        Mood and Affect: Mood normal.        Behavior: Behavior normal. Behavior is cooperative.        Thought Content: Thought content normal.        Judgment: Judgment normal.    Diabetic Foot Exam - Simple   Simple Foot Form Visual Inspection See comments: Yes Sensation Testing Intact to touch and monofilament testing bilaterally: Yes Pulse Check Posterior Tibialis and Dorsalis pulse intact bilaterally: Yes Comments Xerosis and thickened toenails    Results for orders placed or performed in visit on 07/16/20  Microscopic Examination   Urine  Result Value Ref Range  WBC, UA None seen 0 - 5 /hpf   RBC 0-2 0 - 2 /hpf   Epithelial Cells (non renal) 0-10 0 - 10 /hpf   Bacteria, UA None seen None seen/Few  Urinalysis, Complete  Result Value Ref Range   Specific Gravity, UA 1.020 1.005 - 1.030   pH, UA 7.0 5.0 - 7.5   Color, UA Yellow Yellow   Appearance Ur Clear Clear   Leukocytes,UA Negative Negative   Protein,UA Negative Negative/Trace   Glucose, UA Negative Negative   Ketones, UA Negative Negative   RBC, UA Trace (A) Negative   Bilirubin, UA Negative Negative   Urobilinogen, Ur 1.0 0.2 - 1.0 mg/dL   Nitrite, UA Negative Negative   Microscopic Examination See below:       Assessment & Plan:   Problem List Items Addressed This Visit      Cardiovascular and Mediastinum   Hypertension associated with diabetes (HCC)    Chronic, ongoing.  BP at goal today and mainly at goal on home readings.  Recommend he continue to check BP at home twice daily  and document for provider visits. Continue current medication regimen at this time, is on max dose multiple medications.  May need to adjust in future and change HCTZ if return of gout flares presents.  CMP and TSH today.  Return in 3 months for physical.      Relevant Orders   Microalbumin, Urine Waived   Comprehensive metabolic panel   TSH   Aortic atherosclerosis ([place])    Noted on 09/16/19 imaging.  Continue daily statin and ASA for prevention + focus on healthy diet and modest weight loss.        Respiratory   Sleep apnea    Continues to use CPAP faithfully every night and denies any issues with this.        Endocrine   Type 2 diabetes mellitus with proteinuria (HCC) - Primary    Chronic, ongoing, followed by endocrinology with insulin pump in place.  Continue current collaboration and pump settings per endo -- recent note reviewed.  A1C with endo recently 6.7% -- urine ALB 30 today, continue Losartan for kidney protection.  Recommend focus on diet and modest weight loss.  Does endorse poor diet.  Recommend he continue to monitor BS closely at home and if any consistent <70 immediately alert endo for pump changes.  Return in 3 months.      Hyperlipidemia associated with type 2 diabetes mellitus (HCC)    Chronic, ongoing.  Continue current medication regimen and adjust as needed.  Lipid panel today.         Relevant Orders   Lipid Panel w/o Chol/HDL Ratio   Type 2 diabetes mellitus with both eyes affected by mild nonproliferative retinopathy without macular edema, with long-term current use of insulin (HCC)    Chronic, ongoing, followed by endocrinology with insulin pump in place + followed by Dr. Matilde Sprang for eye exams.  Continue current collaboration and pump settings per endo.  A1C recently 6.7%.  Recommend focus on diet and modest weight loss.  Does endorse poor diet.  Recommend he continue to monitor BS closely at home and if any consistent <70 immediately alert endo for pump  changes.  Return in 3 months.      Relevant Orders   Comprehensive metabolic panel     Genitourinary   Asymptomatic microscopic hematuria    Currently resolved, continue collaboration with urology -- recent note reviewed.  Other   Gout    Chronic, stable.  Continue current medication regimen and adjust as needed.  Uric acid level next visit.  May need to discontinue HCTZ in future if return of flares presents.      History of prostate cancer    Continue to collaborate with urology as needed.      BMI 40.0-44.9, adult ([place])    Recommend focus on modest weight loss with diet changes and regular exercise regimen.        Spinal stenosis, cervical region    Chronic, ongoing -- is [employer] with history of frequent repelling -- suspect this is underlying cause of issues.  Is unable to get service connection at [place] due to income.  At this time continue collaboration with Dr. Holley Raring at pain management and Dr. Lacinda Axon with neurosurgery.  May benefit from PT in future.       Morbid obesity (HCC)    BMI 40.95 with T2DM, HTN.  Recommended eating smaller high protein, low fat meals more frequently and exercising 30 mins a day 5 times a week with a goal of 10-15lb weight loss in the next 3 months. Patient voiced their understanding and motivation to adhere to these recommendations.       Presence of insulin pump    Continue to collaborate with endocrinology.          Follow up plan: Return in about 3 months (around 11/04/2020) for Annual physical.

## 2020-08-04 NOTE — Assessment & Plan Note (Signed)
Chronic, ongoing.  Continue current medication regimen and adjust as needed. Lipid panel today. 

## 2020-08-04 NOTE — Assessment & Plan Note (Signed)
Chronic, ongoing -- is [employer] with history of frequent repelling -- suspect this is underlying cause of issues.  Is unable to get [facility] due to [facility].  At this time continue collaboration with Dr. Lateef at pain management and Dr. Cook with neurosurgery.  May benefit from PT in future.  

## 2020-08-04 NOTE — Assessment & Plan Note (Signed)
Chronic, ongoing.  BP at goal today and mainly at goal on home readings.  Recommend he continue to check BP at home twice daily and document for provider visits. Continue current medication regimen at this time, is on max dose multiple medications.  May need to adjust in future and change HCTZ if return of gout flares presents.  CMP and TSH today.  Return in 3 months for physical.

## 2020-08-04 NOTE — Assessment & Plan Note (Signed)
Continue to collaborate with urology as needed.

## 2020-08-04 NOTE — Patient Instructions (Signed)

## 2020-08-04 NOTE — Assessment & Plan Note (Signed)
Recommend focus on modest weight loss with diet changes and regular exercise regimen.   

## 2020-08-04 NOTE — Assessment & Plan Note (Signed)
Currently resolved, continue collaboration with urology -- recent note reviewed.

## 2020-08-04 NOTE — Assessment & Plan Note (Signed)
Chronic, ongoing, followed by endocrinology with insulin pump in place + followed by Dr. Matilde Sprang for eye exams.  Continue current collaboration and pump settings per endo.  A1C recently 6.7%.  Recommend focus on diet and modest weight loss.  Does endorse poor diet.  Recommend he continue to monitor BS closely at home and if any consistent <70 immediately alert endo for pump changes.  Return in 3 months.

## 2020-08-04 NOTE — Assessment & Plan Note (Addendum)
Chronic, ongoing, followed by endocrinology with insulin pump in place.  Continue current collaboration and pump settings per endo -- recent note reviewed.  A1C with endo recently 6.7% -- urine ALB 30 today, continue Losartan for kidney protection.  Recommend focus on diet and modest weight loss.  Does endorse poor diet.  Recommend he continue to monitor BS closely at home and if any consistent <70 immediately alert endo for pump changes.  Return in 3 months.

## 2020-08-04 NOTE — Assessment & Plan Note (Signed)
BMI 40.95 with T2DM, HTN.  Recommended eating smaller high protein, low fat meals more frequently and exercising 30 mins a day 5 times a week with a goal of 10-15lb weight loss in the next 3 months. Patient voiced their understanding and motivation to adhere to these recommendations.

## 2020-08-04 NOTE — Assessment & Plan Note (Signed)
Noted on 09/16/19 imaging.  Continue daily statin and ASA for prevention + focus on healthy diet and modest weight loss.

## 2020-08-04 NOTE — Assessment & Plan Note (Signed)
Continues to use CPAP faithfully every night and denies any issues with this.

## 2020-08-05 DIAGNOSIS — G4733 Obstructive sleep apnea (adult) (pediatric): Secondary | ICD-10-CM | POA: Diagnosis not present

## 2020-08-05 LAB — COMPREHENSIVE METABOLIC PANEL
ALT: 17 IU/L (ref 0–44)
AST: 19 IU/L (ref 0–40)
Albumin/Globulin Ratio: 1.4 (ref 1.2–2.2)
Albumin: 4.2 g/dL (ref 3.8–4.8)
Alkaline Phosphatase: 145 IU/L — ABNORMAL HIGH (ref 44–121)
BUN/Creatinine Ratio: 15 (ref 10–24)
BUN: 19 mg/dL (ref 8–27)
Bilirubin Total: 0.5 mg/dL (ref 0.0–1.2)
CO2: 23 mmol/L (ref 20–29)
Calcium: 9.3 mg/dL (ref 8.6–10.2)
Chloride: 102 mmol/L (ref 96–106)
Creatinine, Ser: 1.27 mg/dL (ref 0.76–1.27)
Globulin, Total: 3 g/dL (ref 1.5–4.5)
Glucose: 119 mg/dL — ABNORMAL HIGH (ref 65–99)
Potassium: 4.1 mmol/L (ref 3.5–5.2)
Sodium: 141 mmol/L (ref 134–144)
Total Protein: 7.2 g/dL (ref 6.0–8.5)
eGFR: 62 mL/min/{1.73_m2} (ref >59)

## 2020-08-05 LAB — LIPID PANEL W/O CHOL/HDL RATIO
Cholesterol, Total: 98 mg/dL — ABNORMAL LOW (ref 100–199)
HDL: 33 mg/dL — ABNORMAL LOW (ref >39)
LDL Chol Calc (NIH): 44 mg/dL (ref 0–99)
Triglycerides: 111 mg/dL (ref 0–149)
VLDL Cholesterol Cal: 21 mg/dL (ref 5–40)

## 2020-08-05 LAB — TSH: TSH: 2.61 u[IU]/mL (ref 0.450–4.500)

## 2020-08-05 NOTE — Progress Notes (Signed)
Contacted via [place] afternoon [place], your labs have returned.  Overall they remain stable, kidney (creatinine and eGFR) + liver (AST and ALT) function remain stable.  Alkaline phosphatase, which at times look at gall bladder, is mildly elevated and we will continue to monitor this.  Cholesterol levels are at goal.  Thyroid is normal.  Any questions? Keep being awesome!!  Thank you for allowing me to participate in your care. Kindest regards, Zarina Pe

## 2020-08-18 DIAGNOSIS — E1169 Type 2 diabetes mellitus with other specified complication: Secondary | ICD-10-CM | POA: Diagnosis not present

## 2020-08-18 DIAGNOSIS — E669 Obesity, unspecified: Secondary | ICD-10-CM | POA: Diagnosis not present

## 2020-08-18 NOTE — Progress Notes (Signed)
Chronic Care Management Pharmacy Note  08/23/2020 Name:  Aaron [name] MRN:  [id number] DOB:  1950-12-12  Subjective: Aaron [name] is an 70 y.o. year old male who is a primary patient of Cannady, Barbaraann Faster, NP.  The CCM team was consulted for assistance with disease management and care coordination needs.    Engaged with patient by telephone for follow up visit in response to provider referral for pharmacy case management and/or care coordination services.   Consent to Services:  The patient was given information about Chronic Care Management services, agreed to services, and gave verbal consent prior to initiation of services.  Please see initial visit note for detailed documentation.   Patient Care Team: Venita Lick, NP as PCP - General (Nurse Practitioner) Judi Cong, MD as Consulting Physician (Endocrinology) Vladimir Faster, [facility] as Pharmacist (Pharmacist)  Recent office visits: 08/04/20-Cannady (PCP)- blood work, no med Careers adviser  Recent consult visits: 07/16/20-Stoiff(Urology)-UA, h/o hematuria (resloved) radiation cystitis 07/15/20- Lateef ( pain mgmnt)- methocarbamol 750 mg q12h prn, gabapentin 600-900 qhs, neck pain 3/10 06/21/20- Solum (endocrinology)- insulin pump, 24 hour basal  88 units humalog, avg daily use 130 u daily,  Dexcom 2 week avg 141 mg/dL, range 49-223, in range(70-170) 86% , adjusted overnight rate, changing to Humalog U200- PAP applied Hospital visits: None in previous 6 months  Objective:  Lab Results  Component Value Date   CREATININE 1.27 08/04/2020   BUN 19 08/04/2020   GFRNONAA 64 05/04/2020   GFRAA 74 05/04/2020   NA 141 08/04/2020   K 4.1 08/04/2020   CALCIUM 9.3 08/04/2020   CO2 23 08/04/2020   GLUCOSE 119 (H) 08/04/2020    Lab Results  Component Value Date/Time   HGBA1C 6.3 10/31/2019 08:53 AM   HGBA1C 6.9 08/19/2019 12:00 AM   HGBA1C 7.6 08/15/2017 12:00 AM   MICROALBUR 30 (H) 08/04/2020 09:12 AM   MICROALBUR 80 (H)  02/03/2020 10:16 AM    Last diabetic Eye exam:  Lab Results  Component Value Date/Time   HMDIABEYEEXA No Retinopathy 03/03/2020 12:00 AM    Last diabetic Foot exam: No results found for: HMDIABFOOTEX   Lab Results  Component Value Date   CHOL 98 (L) 08/04/2020   HDL 33 (L) 08/04/2020   LDLCALC 44 08/04/2020   TRIG 111 08/04/2020   CHOLHDL 3.1 08/15/2017    Hepatic Function Latest Ref Rng & Units 08/04/2020 10/31/2019 07/02/2019  Total Protein 6.0 - 8.5 g/dL 7.2 6.8 7.3  Albumin 3.8 - 4.8 g/dL 4.2 4.2 4.2  AST 0 - 40 IU/L 19 12 17   ALT 0 - 44 IU/L 17 12 13   Alk Phosphatase 44 - 121 IU/L 145(H) 145(H) 144(H)  Total Bilirubin 0.0 - 1.2 mg/dL 0.5 0.4 0.3    Lab Results  Component Value Date/Time   TSH 2.610 08/04/2020 09:15 AM   TSH 2.610 10/31/2019 08:59 AM    CBC Latest Ref Rng & Units 09/08/2019 11/30/2017 11/29/2017  WBC 3.4 - 10.8 x10E3/uL 9.5 13.1(H) 15.0(H)  Hemoglobin 13.0 - 17.7 g/dL 13.5 10.2(L) 11.2(L)  Hematocrit 37.5 - 51.0 % 40.8 30.3(L) 33.4(L)  Platelets 150 - 450 x10E3/uL 285 185 199    No results found for: VD25OH  Clinical ASCVD: Yes  The ASCVD Risk score Mikey Bussing DC Jr., et al., 2013) failed to calculate for the following reasons:   The valid total cholesterol range is 130 to 320 mg/dL    Depression screen [facility] 2/9 10/31/2019 10/15/2019 10/23/2018  Decreased Interest 0  0 0  Down, Depressed, Hopeless 0 0 0  PHQ - 2 Score 0 0 0  Altered sleeping 0 - -  Tired, decreased energy 0 - -  Change in appetite 0 - -  Feeling bad or failure about yourself  0 - -  Trouble concentrating 0 - -  Moving slowly or fidgety/restless 0 - -  Suicidal thoughts 0 - -  PHQ-9 Score 0 - -    Spinal stenosis, getting a lot of headaches   Social History   Tobacco Use  Smoking Status Never Smoker  Smokeless Tobacco Never Used   BP Readings from Last 3 Encounters:  08/04/20 115/68  07/16/20 (!) 150/77  07/15/20 135/61   Pulse Readings from Last 3 Encounters:  08/04/20 65   07/16/20 78  07/15/20 69   Wt Readings from Last 3 Encounters:  08/04/20 (!) 327 lb 9.6 oz (148.6 kg)  07/16/20 (!) 325 lb (147.4 kg)  07/15/20 (!) 325 lb (147.4 kg)   BMI Readings from Last 3 Encounters:  08/04/20 40.95 kg/m  07/16/20 40.62 kg/m  07/15/20 40.62 kg/m    Assessment/Interventions: Review of patient past medical history, allergies, medications, health status, including review of consultants reports, laboratory and other test data, was performed as part of comprehensive evaluation and provision of chronic care management services.   SDOH:  (Social Determinants of Health) assessments and interventions performed: No  SDOH Screenings   Alcohol Screen: Not on file  Depression (PHQ2-9): Low Risk   . PHQ-2 Score: 0  Financial Resource Strain: Medium Risk  . Difficulty of Paying Living Expenses: Somewhat hard  Food Insecurity: Not on file  Housing: Not on file  Physical Activity: Not on file  Social Connections: Not on file  Stress: Not on file  Tobacco Use: Low Risk   . Smoking Tobacco Use: Never Smoker  . Smokeless Tobacco Use: Never Used  Transportation Needs: Not on file     Immunization History  Administered Date(s) Administered  . Fluad Quad(high Dose 65+) 03/28/2019, 02/03/2020  . Influenza, High Dose Seasonal PF 03/29/2018  . Influenza,inj,Quad PF,6+ Mos 07/11/2016, 02/15/2017  . Influenza-Unspecified 03/26/2015  . PFIZER(Purple Top)SARS-COV-2 Vaccination 08/05/2019, 08/26/2019, 02/25/2020  . Pneumococcal Conjugate-13 03/21/2018  . Pneumococcal Polysaccharide-23 07/02/2019  . Pneumococcal-Unspecified 10/20/1992, 02/20/1999  . Tdap 05/31/2011  . Zoster 07/11/2016    Conditions to be addressed/monitored:  Hypertension, Hyperlipidemia, Diabetes, Coronary Artery Disease, Gout and morbid obesity, spinal stenosis and history of prostate cancer  Care Plan : [place]  Updates made by Vladimir Faster, [place] since 08/23/2020 12:00 AM     Problem: CAD, DM2, HTN, HLD, Chronic pain 2/2 cervical spinal stenosis, gout, morbid obesity, OSA, h/o prostate cancer   Priority: High    Long-Range Goal: Disease Management   This Visit's Progress: On track  Priority: High  Note:   Current Barriers:  . Unable to independently afford treatment regimen . Unable to achieve control of chronic neck pain  . Unable to maintain control of diabetes and hypertension  Pharmacist Clinical Goal(s):  Marland Kitchen Patient will verbalize ability to afford treatment regimen . achieve control of chronic pain as evidenced by patient report . maintain control of diabetes and BP as evidenced by lab values  . contact provider office for questions/concerns as evidenced notation of same in electronic health record through collaboration with PharmD and provider.  .   Interventions: . 1:1 collaboration with Venita Lick, NP regarding development and update of comprehensive plan of care  as evidenced by provider attestation and co-signature . Inter-disciplinary care team collaboration (see longitudinal plan of care) . Comprehensive medication review performed; medication list updated in electronic medical record  Hypertension (BP goal <130/80) -Not ideally controlled -Current treatment: . Losartan 100 mg qd . HCTZ 25 mg qd . Carvedilol 25 mg bid . Amlodipine 10 mg qd -Medications previously tried: na  -Current home readings: 151/75 this am, most readings 140-150/70-80  -Reports hypotensive/hypertensive symptoms -Educated on BP goals and benefits of medications for prevention of heart attack, stroke and kidney damage; Daily salt intake goal < 2300 mg; Importance of home blood pressure monitoring; Symptoms of hypotension and importance of maintaining adequate hydration; -Counseled to monitor BP at home daily, document, and provide log at future appointments -Recommended to continue current medication Recommended patient compare home BP readings with in  office readings. Bring BP monitor to appointment with PCP for calibration Contact PCP if BP consistenly elevated and frequent headaches continue   Diabetes (A1c goal <7%) -Not ideally controlled -Current medications: . Novolog ~ 130 units daily via insulin pump -- Changing to Humalog U-200 on 08/25/20 at endo -Medications previously tried: NA  -Current home glucose readings . fasting glucose: 111 this am  post prandial glucose: Basal rates Correction factor Carb ratio Target BG 12 am 4.7 units/hr 10 4 110 3 am 4.0 units/hr 10 4 110 8:30 am 2 units/hr 10 4 110 11 am 3 units.hr 10 4 110 1 pm 3 units/hr 10 3 110 2 pm 4 units/hr 10 3 110  24-hr basal = 88 units Humalog  . Uses  Dexcom CGM  -Denies hypoglycemic/hyperglycemic symptoms -Educated on A1c and blood sugar goals; Prevention and management of hypoglycemic episodes; Benefits of routine self-monitoring of blood sugar; -Counseled to check feet daily and get yearly eye exams -Recommended to continue current medication  Chronic pain (neck) (Goal: alleviation of symptoms and improved quality of life) -Not ideally controlled -Current treatment  . Gabapentin 600 mg qhs . Methocarbamol 750 mg qhs -Medications previously tried: tizanidine  -Recommended to continue current medication Counseled on potential sedating effects of methocarbamol. Effects of increased pain on BP Educated on effects of steroid injection on BG and recommend patient inform endo if this is decided next step in mangement of neck apin.   Patient Goals/Self-Care Activities . Patient will:  - take medications as prescribed check blood pressure daily, document, and provide at future appointments target a minimum of 150 minutes of moderate intensity exercise weekly  Follow Up Plan: Telephone follow up appointment with care management team member scheduled for: 1 month CPA, 2 months PharmD.        Medication Assistance: U -200 Humalog through [facility]  patient assistance coordinated by endo.  Patient's preferred pharmacy is:  [id number] [address], [place] [address] Phone: [phone] Fax: [phone]  [id number] [place] [address] [id number]: [phone]: [address] [place]Phone: [phone]: [phone]  We discussed: Current pharmacy is preferred with insurance plan and patient is satisfied with pharmacy services Patient decided to: Continue current medication management strategy  Care Plan and Follow Up Patient Decision:  Patient agrees to Care Plan and Follow-up.  Plan: Telephone follow up appointment with  care management team member scheduled for:  CPA one month, PharmD 2 months  Junita Push. Kenton Kingfisher PharmD, [facility] [phone]

## 2020-08-21 DIAGNOSIS — E1165 Type 2 diabetes mellitus with hyperglycemia: Secondary | ICD-10-CM | POA: Diagnosis not present

## 2020-08-23 ENCOUNTER — Ambulatory Visit (INDEPENDENT_AMBULATORY_CARE_PROVIDER_SITE_OTHER): Payer: Medicare HMO | Admitting: Pharmacist

## 2020-08-23 DIAGNOSIS — E1159 Type 2 diabetes mellitus with other circulatory complications: Secondary | ICD-10-CM | POA: Diagnosis not present

## 2020-08-23 DIAGNOSIS — E1129 Type 2 diabetes mellitus with other diabetic kidney complication: Secondary | ICD-10-CM

## 2020-08-23 DIAGNOSIS — R809 Proteinuria, unspecified: Secondary | ICD-10-CM | POA: Diagnosis not present

## 2020-08-23 DIAGNOSIS — M4802 Spinal stenosis, cervical region: Secondary | ICD-10-CM

## 2020-08-23 DIAGNOSIS — I152 Hypertension secondary to endocrine disorders: Secondary | ICD-10-CM | POA: Diagnosis not present

## 2020-08-23 NOTE — Patient Instructions (Addendum)
Visit Information  It was a pleasure speaking with you today. Thank you for letting me be part of your clinical team. Please call with any questions or concerns.   Goals Addressed            This Visit's Progress   . Manage Chronic Pain       Timeframe:  Short-Term Goal Priority:  High Start Date:                             Expected End Date:                       Follow Up Date 2 month follow up appt   - call for medicine refill 2 or 3 days before it runs out - develop a personal pain management plan - keep track of prescription refills - use ice or heat for pain relief    Why is this important?    Day-to-day life can be hard when you have chronic pain.   Pain medicine is just one piece of the treatment puzzle.   You can try these action steps to help you manage your pain.    Notes:     Marland Kitchen Monitor and Manage My Blood Sugar-Diabetes Type 2       Timeframe:  Long-Range Goal Priority:  High Start Date:                             Expected End Date:                       Follow Up Date  1 month assessment call   - check blood sugar at prescribed times - check blood sugar if I feel it is too high or too low - take the blood sugar meter to all doctor visits    Why is this important?    Checking your blood sugar at home helps to keep it from getting very high or very low.   Writing the results in a diary or log helps the doctor know how to care for you.   Your blood sugar log should have the time, date and the results.   Also, write down the amount of insulin or other medicine that you take.   Other information, like what you ate, exercise done and how you were feeling, will also be helpful.     Notes:     . Perform Foot Care-Diabetes Type 2       Timeframe:  Long-Range Goal Priority:  High Start Date:                             Expected End Date:                       Follow Up Date  1 month assessment call   - check feet daily for cuts, sores or  redness - keep feet up while sitting - wear comfortable, well-fitting shoes    Why is this important?    Good foot care is very important when you have diabetes.   There are many things you can do to keep your feet healthy and catch a problem early.    Notes:        The patient verbalized understanding of  instructions, educational materials, and care plan provided today and agreed to receive a mailed copy of patient instructions, educational materials, and care plan.   Telephone follow up appointment with pharmacy team member scheduled for: 1 month  Junita Push. Yosselyn Tax PharmD, BCPS Clinical Pharmacist [phone]  Diabetes Mellitus and Nutrition, Adult When you have diabetes, or diabetes mellitus, it is very important to have healthy eating habits because your blood sugar (glucose) levels are greatly affected by what you eat and drink. Eating healthy foods in the right amounts, at about the same times every day, can help you:  Control your blood glucose.  Lower your risk of heart disease.  Improve your blood pressure.  Reach or maintain a healthy weight. What can affect my meal plan? Every person with diabetes is different, and each person has different needs for a meal plan. Your health care provider may recommend that you work with a dietitian to make a meal plan that is best for you. Your meal plan may vary depending on factors such as:  The calories you need.  The medicines you take.  Your weight.  Your blood glucose, blood pressure, and cholesterol levels.  Your activity level.  Other health conditions you have, such as heart or kidney disease. How do carbohydrates affect me? Carbohydrates, also called carbs, affect your blood glucose level more than any other type of food. Eating carbs naturally raises the amount of glucose in your blood. Carb counting is a method for keeping track of how many carbs you eat. Counting carbs is important to keep your blood glucose at  a healthy level, especially if you use insulin or take certain oral diabetes medicines. It is important to know how many carbs you can safely have in each meal. This is different for every person. Your dietitian can help you calculate how many carbs you should have at each meal and for each snack. How does alcohol affect me? Alcohol can cause a sudden decrease in blood glucose (hypoglycemia), especially if you use insulin or take certain oral diabetes medicines. Hypoglycemia can be a life-threatening condition. Symptoms of hypoglycemia, such as sleepiness, dizziness, and confusion, are similar to symptoms of having too much alcohol.  Do not drink alcohol if: ? Your health care provider tells you not to drink. ? You are pregnant, may be pregnant, or are planning to become pregnant.  If you drink alcohol: ? Do not drink on an empty stomach. ? Limit how much you use to:  0-1 drink a day for women.  0-2 drinks a day for men. ? Be aware of how much alcohol is in your drink. In the [place] one drink equals one 12 oz bottle of beer (355 mL), one 5 oz glass of wine (148 mL), or one 1 oz glass of hard liquor (44 mL). ? Keep yourself hydrated with water, diet soda, or unsweetened iced tea.  Keep in mind that regular soda, juice, and other mixers may contain a lot of sugar and must be counted as carbs. What are tips for following this plan? Reading food labels  Start by checking the serving size on the "Nutrition Facts" label of packaged foods and drinks. The amount of calories, carbs, fats, and other nutrients listed on the label is based on one serving of the item. Many items contain more than one serving per package.  Check the total grams (g) of carbs in one serving. You can calculate the number of servings of carbs in one serving by dividing  the total carbs by 15. For example, if a food has 30 g of total carbs per serving, it would be equal to 2 servings of carbs.  Check the number of grams (g)  of saturated fats and trans fats in one serving. Choose foods that have a low amount or none of these fats.  Check the number of milligrams (mg) of salt (sodium) in one serving. Most people should limit total sodium intake to less than 2,300 mg per day.  Always check the nutrition information of foods labeled as "low-fat" or "nonfat." These foods may be higher in added sugar or refined carbs and should be avoided.  Talk to your dietitian to identify your daily goals for nutrients listed on the label. Shopping  Avoid buying canned, pre-made, or processed foods. These foods tend to be high in fat, sodium, and added sugar.  Shop around the outside edge of the grocery store. This is where you will most often find fresh fruits and vegetables, bulk grains, fresh meats, and fresh dairy. Cooking  Use low-heat cooking methods, such as baking, instead of high-heat cooking methods like deep frying.  Cook using healthy oils, such as olive, canola, or sunflower oil.  Avoid cooking with butter, cream, or high-fat meats. Meal planning  Eat meals and snacks regularly, preferably at the same times every day. Avoid going long periods of time without eating.  Eat foods that are high in fiber, such as fresh fruits, vegetables, beans, and whole grains. Talk with your dietitian about how many servings of carbs you can eat at each meal.  Eat 4-6 oz (112-168 g) of lean protein each day, such as lean meat, chicken, fish, eggs, or tofu. One ounce (oz) of lean protein is equal to: ? 1 oz (28 g) of meat, chicken, or fish. ? 1 egg. ?  cup (62 g) of tofu.  Eat some foods each day that contain healthy fats, such as avocado, nuts, seeds, and fish.   What foods should I eat? Fruits Berries. Apples. Oranges. Peaches. Apricots. Plums. Grapes. Mango. Papaya. Pomegranate. Kiwi. Cherries. Vegetables Lettuce. Spinach. Leafy greens, including kale, chard, collard greens, and mustard greens. Beets. Cauliflower. Cabbage.  Broccoli. Carrots. Green beans. Tomatoes. Peppers. Onions. Cucumbers. Brussels sprouts. Grains Whole grains, such as whole-wheat or whole-grain bread, crackers, tortillas, cereal, and pasta. Unsweetened oatmeal. Quinoa. Brown or wild rice. Meats and other proteins Seafood. Poultry without skin. Lean cuts of poultry and beef. Tofu. Nuts. Seeds. Dairy Low-fat or fat-free dairy products such as milk, yogurt, and cheese. The items listed above may not be a complete list of foods and beverages you can eat. Contact a dietitian for more information. What foods should I avoid? Fruits Fruits canned with syrup. Vegetables Canned vegetables. Frozen vegetables with butter or cream sauce. Grains Refined white flour and flour products such as bread, pasta, snack foods, and cereals. Avoid all processed foods. Meats and other proteins Fatty cuts of meat. Poultry with skin. Breaded or fried meats. Processed meat. Avoid saturated fats. Dairy Full-fat yogurt, cheese, or milk. Beverages Sweetened drinks, such as soda or iced tea. The items listed above may not be a complete list of foods and beverages you should avoid. Contact a dietitian for more information. Questions to ask a health care provider  Do I need to meet with a diabetes educator?  Do I need to meet with a dietitian?  What number can I call if I have questions?  When are the best times to check my blood  glucose? Where to find more information:  American Diabetes Association: diabetes.org  Academy of Nutrition and Dietetics: www.eatright.[employer] of Diabetes and Digestive and Kidney Diseases: [employer]  Association of Diabetes Care and Education Specialists: www.diabeteseducator.org Summary  It is important to have healthy eating habits because your blood sugar (glucose) levels are greatly affected by what you eat and drink.  A healthy meal plan will help you control your blood glucose and maintain a healthy  lifestyle.  Your health care provider may recommend that you work with a dietitian to make a meal plan that is best for you.  Keep in mind that carbohydrates (carbs) and alcohol have immediate effects on your blood glucose levels. It is important to count carbs and to use alcohol carefully. This information is not intended to replace advice given to you by your health care provider. Make sure you discuss any questions you have with your health care provider. Document Revised: 04/15/2019 Document Reviewed: 04/15/2019 Elsevier Patient Education  2021 [employer].

## 2020-08-25 DIAGNOSIS — E1169 Type 2 diabetes mellitus with other specified complication: Secondary | ICD-10-CM | POA: Diagnosis not present

## 2020-08-25 DIAGNOSIS — Z794 Long term (current) use of insulin: Secondary | ICD-10-CM | POA: Diagnosis not present

## 2020-08-25 DIAGNOSIS — E669 Obesity, unspecified: Secondary | ICD-10-CM | POA: Diagnosis not present

## 2020-08-25 DIAGNOSIS — E113293 Type 2 diabetes mellitus with mild nonproliferative diabetic retinopathy without macular edema, bilateral: Secondary | ICD-10-CM | POA: Diagnosis not present

## 2020-08-25 DIAGNOSIS — Z9641 Presence of insulin pump (external) (internal): Secondary | ICD-10-CM | POA: Diagnosis not present

## 2020-08-31 DIAGNOSIS — Z7984 Long term (current) use of oral hypoglycemic drugs: Secondary | ICD-10-CM | POA: Diagnosis not present

## 2020-08-31 DIAGNOSIS — H52223 Regular astigmatism, bilateral: Secondary | ICD-10-CM | POA: Diagnosis not present

## 2020-08-31 DIAGNOSIS — H40013 Open angle with borderline findings, low risk, bilateral: Secondary | ICD-10-CM | POA: Diagnosis not present

## 2020-08-31 DIAGNOSIS — H5203 Hypermetropia, bilateral: Secondary | ICD-10-CM | POA: Diagnosis not present

## 2020-08-31 DIAGNOSIS — H524 Presbyopia: Secondary | ICD-10-CM | POA: Diagnosis not present

## 2020-08-31 DIAGNOSIS — H2513 Age-related nuclear cataract, bilateral: Secondary | ICD-10-CM | POA: Diagnosis not present

## 2020-08-31 DIAGNOSIS — E119 Type 2 diabetes mellitus without complications: Secondary | ICD-10-CM | POA: Diagnosis not present

## 2020-08-31 LAB — HM DIABETES EYE EXAM

## 2020-09-09 DIAGNOSIS — Z0100 Encounter for examination of eyes and vision without abnormal findings: Secondary | ICD-10-CM | POA: Diagnosis not present

## 2020-09-11 ENCOUNTER — Other Ambulatory Visit: Payer: Self-pay | Admitting: Nurse Practitioner

## 2020-09-11 DIAGNOSIS — I152 Hypertension secondary to endocrine disorders: Secondary | ICD-10-CM

## 2020-09-11 DIAGNOSIS — E1159 Type 2 diabetes mellitus with other circulatory complications: Secondary | ICD-10-CM

## 2020-09-20 DIAGNOSIS — E1165 Type 2 diabetes mellitus with hyperglycemia: Secondary | ICD-10-CM | POA: Diagnosis not present

## 2020-09-23 ENCOUNTER — Telehealth: Payer: Self-pay | Admitting: Pharmacist

## 2020-09-23 NOTE — Chronic Care Management (AMB) (Signed)
Chronic Care Management Pharmacy Assistant   Name: Aaron [name]  MRN: [id number] DOB: 05-13-1951   Reason for Encounter: Disease State Hypertension   Recent office visits:  None noted  Recent consult visits:  08/31/2020 Felipa Evener [facility] (Endocrinology)  Hospital visits:  None in previous 6 months  Medications: Outpatient Encounter Medications as of 09/23/2020  Medication Sig  . ACCU-CHEK GUIDE test strip   . acetaminophen (TYLENOL) 500 MG tablet Take 1-2 tablets (500-1,000 mg total) by mouth every 6 (six) hours as needed for mild pain (pain score 1-3 or temp > 100.5).  Marland Kitchen allopurinol (ZYLOPRIM) 300 MG tablet TAKE 1 TABLET EVERY DAY  . amLODipine (NORVASC) 10 MG tablet TAKE 1 TABLET EVERY DAY  . ASPIRIN 81 PO every evening.   Marland Kitchen atorvastatin (LIPITOR) 20 MG tablet TAKE 1 TABLET EVERY DAY  . carvedilol (COREG) 25 MG tablet Take 1 tablet (25 mg total) by mouth 2 (two) times daily with a meal.  . gabapentin (NEURONTIN) 600 MG tablet Take 1-1.5 tablets (600-900 mg total) by mouth at bedtime.  . hydrochlorothiazide (HYDRODIURIL) 25 MG tablet TAKE 1 TABLET EVERY DAY  . insulin aspart (NOVOLOG) 100 UNIT/ML injection Inject into the skin. Using via insulin pump  Medtronic MiniMed 630G insulin pump  . Insulin Human (INSULIN PUMP) SOLN Inject into the skin.  Marland Kitchen losartan (COZAAR) 100 MG tablet TAKE 1 TABLET EVERY DAY  . methocarbamol (ROBAXIN) 750 MG tablet Take 1 tablet (750 mg total) by mouth every 12 (twelve) hours as needed for muscle spasms.  . tamsulosin (FLOMAX) 0.4 MG CAPS capsule Take 1 capsule (0.4 mg total) by mouth daily.   No facility-administered encounter medications on file as of 09/23/2020.   Reviewed chart prior to disease state call. Spoke with patient regarding BP  Recent Office Vitals: BP Readings from Last 3 Encounters:  08/04/20 115/68  07/16/20 (!) 150/77  07/15/20 135/61   Pulse Readings from Last 3 Encounters:  08/04/20 65  07/16/20 78  07/15/20 69     Wt Readings from Last 3 Encounters:  08/04/20 (!) 327 lb 9.6 oz (148.6 kg)  07/16/20 (!) 325 lb (147.4 kg)  07/15/20 (!) 325 lb (147.4 kg)     Kidney Function Lab Results  Component Value Date/Time   CREATININE 1.27 08/04/2020 09:15 AM   CREATININE 1.17 05/04/2020 09:36 AM   CREATININE 1.49 (H) 10/26/2012 07:29 PM   GFRNONAA 64 05/04/2020 09:36 AM   GFRNONAA 50 (L) 10/26/2012 07:29 PM   GFRAA 74 05/04/2020 09:36 AM   GFRAA 58 (L) 10/26/2012 07:29 PM    BMP Latest Ref Rng & Units 08/04/2020 05/04/2020 02/03/2020  Glucose 65 - 99 mg/dL 119(H) 102(H) 76  BUN 8 - 27 mg/dL 19 17 18   Creatinine 0.76 - 1.27 mg/dL 1.27 1.17 1.26  BUN/Creat Ratio 10 - 24 15 15 14   Sodium 134 - 144 mmol/L 141 143 144  Potassium 3.5 - 5.2 mmol/L 4.1 4.4 4.8  Chloride 96 - 106 mmol/L 102 105 102  CO2 20 - 29 mmol/L 23 26 28   Calcium 8.6 - 10.2 mg/dL 9.3 9.3 9.4    . Current antihypertensive regimen:  Amlodipine 10 mg  Take 1 tablet daily Carvedilol 25 mg Take 1 tab twice daily with meal Hydrochlorothiazide 25 mg Take 1 tab daily Losartan 100 mg TAKE 1 tablet daily  . How often are you checking your Blood Pressure? daily   . Current home BP readings: 120-140/60-70  . What recent interventions/DTPs have  been made by any provider to improve Blood Pressure control since last CPP Visit: None noted  . Any recent hospitalizations or ED visits since last visit with CPP? No   . What diet changes have been made to improve Blood Pressure Control?  o Patient states he avoids salt at all times except when he eats a tomato sandwich.  . What exercise is being done to improve your Blood Pressure Control?  o Patient states he does not exercise.  Adherence Review: Is the patient currently on ACE/ARB medication? Yes Does the patient have >5 day gap between last estimated fill dates? No     Star Rating Drugs: Atorvastatin 20 mg Last filled: 07/15/2020 90 DS Losartan 100 mg Last filled:07/15/2020 90  DS  [facility] Clinical Pharmacist Assistant [phone]

## 2020-10-04 DIAGNOSIS — E1165 Type 2 diabetes mellitus with hyperglycemia: Secondary | ICD-10-CM | POA: Diagnosis not present

## 2020-10-21 DIAGNOSIS — M25551 Pain in right hip: Secondary | ICD-10-CM | POA: Diagnosis not present

## 2020-10-21 DIAGNOSIS — Z96641 Presence of right artificial hip joint: Secondary | ICD-10-CM | POA: Diagnosis not present

## 2020-10-21 DIAGNOSIS — E1165 Type 2 diabetes mellitus with hyperglycemia: Secondary | ICD-10-CM | POA: Diagnosis not present

## 2020-10-21 DIAGNOSIS — M7061 Trochanteric bursitis, right hip: Secondary | ICD-10-CM | POA: Diagnosis not present

## 2020-11-04 ENCOUNTER — Other Ambulatory Visit: Payer: Self-pay

## 2020-11-04 ENCOUNTER — Encounter: Payer: Self-pay | Admitting: Nurse Practitioner

## 2020-11-04 ENCOUNTER — Ambulatory Visit (INDEPENDENT_AMBULATORY_CARE_PROVIDER_SITE_OTHER): Payer: Medicare HMO | Admitting: Nurse Practitioner

## 2020-11-04 VITALS — BP 128/72 | HR 64 | Temp 98.3°F | Ht 73.0 in | Wt 327.4 lb

## 2020-11-04 DIAGNOSIS — E1129 Type 2 diabetes mellitus with other diabetic kidney complication: Secondary | ICD-10-CM | POA: Diagnosis not present

## 2020-11-04 DIAGNOSIS — Z9641 Presence of insulin pump (external) (internal): Secondary | ICD-10-CM | POA: Diagnosis not present

## 2020-11-04 DIAGNOSIS — R809 Proteinuria, unspecified: Secondary | ICD-10-CM

## 2020-11-04 DIAGNOSIS — E1169 Type 2 diabetes mellitus with other specified complication: Secondary | ICD-10-CM

## 2020-11-04 DIAGNOSIS — G4733 Obstructive sleep apnea (adult) (pediatric): Secondary | ICD-10-CM

## 2020-11-04 DIAGNOSIS — Z6841 Body Mass Index (BMI) 40.0 and over, adult: Secondary | ICD-10-CM | POA: Diagnosis not present

## 2020-11-04 DIAGNOSIS — E1159 Type 2 diabetes mellitus with other circulatory complications: Secondary | ICD-10-CM | POA: Diagnosis not present

## 2020-11-04 DIAGNOSIS — M1A0720 Idiopathic chronic gout, left ankle and foot, without tophus (tophi): Secondary | ICD-10-CM

## 2020-11-04 DIAGNOSIS — E113293 Type 2 diabetes mellitus with mild nonproliferative diabetic retinopathy without macular edema, bilateral: Secondary | ICD-10-CM

## 2020-11-04 DIAGNOSIS — I152 Hypertension secondary to endocrine disorders: Secondary | ICD-10-CM | POA: Diagnosis not present

## 2020-11-04 DIAGNOSIS — Z1211 Encounter for screening for malignant neoplasm of colon: Secondary | ICD-10-CM | POA: Diagnosis not present

## 2020-11-04 DIAGNOSIS — Z0000 Encounter for general adult medical examination without abnormal findings: Secondary | ICD-10-CM

## 2020-11-04 DIAGNOSIS — Z8546 Personal history of malignant neoplasm of prostate: Secondary | ICD-10-CM | POA: Diagnosis not present

## 2020-11-04 DIAGNOSIS — I700 Atherosclerosis of aorta: Secondary | ICD-10-CM

## 2020-11-04 DIAGNOSIS — Z794 Long term (current) use of insulin: Secondary | ICD-10-CM

## 2020-11-04 DIAGNOSIS — E785 Hyperlipidemia, unspecified: Secondary | ICD-10-CM

## 2020-11-04 DIAGNOSIS — R3121 Asymptomatic microscopic hematuria: Secondary | ICD-10-CM

## 2020-11-04 MED ORDER — SHINGRIX 50 MCG/0.5ML IM SUSR: 0.5000 mL | Freq: Once | INTRAMUSCULAR | 0 refills | Status: DC

## 2020-11-04 MED ORDER — SHINGRIX 50 MCG/0.5ML IM SUSR: 0.5000 mL | Freq: Once | INTRAMUSCULAR | 0 refills | Status: AC

## 2020-11-04 NOTE — Assessment & Plan Note (Signed)
Continues to use CPAP faithfully every night and denies any issues with this.

## 2020-11-04 NOTE — Assessment & Plan Note (Signed)
Chronic, ongoing.  BP at goal today on recheck and mainly at goal on home readings.  Recommend he continue to check BP at home twice daily and document for provider visits. Continue current medication regimen at this time, is on max dose multiple medications.  May need to adjust in future and change HCTZ if return of gout flares presents.  CMP and TSH today.  Return in 3 months for follow-up.

## 2020-11-04 NOTE — Assessment & Plan Note (Signed)
Noted on 09/16/19 imaging.  Continue daily statin and ASA for prevention + focus on healthy diet and modest weight loss.

## 2020-11-04 NOTE — Assessment & Plan Note (Signed)
Chronic, ongoing, followed by endocrinology with insulin pump in place.  Continue current collaboration and pump settings per endo -- recent note reviewed.  A1C with endo recently 7.6% -- urine ALB 30 in March 2022, continue Losartan for kidney protection.  Recommend focus on diet and modest weight loss.  Does endorse poor diet on occasion.  Recommend he continue to monitor BS closely at home and if any consistent <70 immediately alert endo for pump changes.  Return in 3 months.

## 2020-11-04 NOTE — Assessment & Plan Note (Signed)
Chronic, ongoing.  Continue current medication regimen and adjust as needed. Lipid panel today. 

## 2020-11-04 NOTE — Assessment & Plan Note (Signed)
BMI 43.20 with T2DM, HTN.  Recommended eating smaller high protein, low fat meals more frequently and exercising 30 mins a day 5 times a week with a goal of 10-15lb weight loss in the next 3 months. Patient voiced their understanding and motivation to adhere to these recommendations.

## 2020-11-04 NOTE — Assessment & Plan Note (Signed)
Recommend focus on modest weight loss with diet changes and regular exercise regimen.   

## 2020-11-04 NOTE — Assessment & Plan Note (Signed)
Continue to collaborate with urology as needed.  PSA today to ensure stable.

## 2020-11-04 NOTE — Patient Instructions (Signed)
Colonoscopy, Adult A colonoscopy is an exam to look at the large intestine. It is done using along, thin, flexible tube that has a camera on the end. This exam is done to check for problems, such as: Lumps (tumors). Growths (polyps). Irritation and swelling (inflammation). Bleeding. Tell your doctor about: Any allergies you have. All medicines you are taking. Tell him or her about vitamins, herbs, eye drops, creams, and over-the-counter medicines. Any problems you or family members have had with anesthetic medicines. Any blood disorders you have. Any surgeries you have had. Any medical conditions you have. Whether you are pregnant or may be pregnant. Any problems you have had with pooping (having bowel movements). What are the risks? Generally, this is a safe procedure. However, problems may occur, such as: Bleeding. Damage to your intestine. Allergic reactions to medicines given during the procedure. Infection. This is rare. What happens before the procedure? Eating and drinking Follow instructions from your doctor about eating and drinking. These may include: A few days before the procedure: Follow a low-fiber diet. Avoid these foods: Nuts. Seeds. Dried fruit. Raw fruits. Vegetables. 1-3 days before the procedure: Eat only gelatin dessert or ice pops. Drink only clear liquids, such as: Water. Clear broth or bouillon. Black coffee or tea. Clear juice. Clear soft drinks or sports drinks. Avoid liquids that have red or purple dye. On the day of the procedure: Do not eat solid foods. You may continue to drink clear liquids until up to 2 hours before the procedure. Do not eat or drink anything starting 2 hours before the procedure or as told by your doctor. Bowel prep If you were prescribed a bowel prep to take by mouth (orally) to clean out your colon: Take it as told by your doctor. Starting the day before your procedure, you will need to drink a lot of liquid medicine.  The liquid will cause you to poop until your poop is almost clear or light green. If your skin or butt gets irritated from diarrhea, you may: Wipe the area with wipes that have medicine in them, such as adult wet wipes with aloe and vitamin E. Put something on your skin that soothes the area, such as petroleum jelly. If you vomit while drinking the bowel prep: Take a break for up to 60 minutes. Begin the bowel prep again. Call your doctor if you keep vomiting and you cannot take the bowel prep without vomiting. To clean out your colon, you may also be given: Laxative medicines. These help you poop. Instructions for using a liquid medicine (enema) injected into your butt. Medicines Ask your doctor about: Changing or stopping your normal medicines. This is important. Taking aspirin and ibuprofen. Do not take these medicines unless your doctor tells you to take them. Taking over-the-counter medicines, vitamins, herbs, and supplements. General instructions Ask your doctor what steps will be taken to help prevent the spread of germs. These may include washing skin with a germ-killing soap. Plan to have someone take you home from the hospital or clinic. What happens during the procedure?  An IV tube may be put into one of your veins. You may be given one or more of the following: A medicine to help you relax (sedative). A medicine to numb the area (local anesthetic). A medicine to make you fall asleep (general anesthetic). This is rarely needed. You will lie on your side with your knees bent. The tube will: Have oil or gel put on it. Be put into the opening  of the butt (anus). Be gently put into your large intestine. Air will be sent into your colon to keep it open. You may feel some pressure or cramping. The camera will be used to take photos that will appear on a screen. A small tissue sample may be removed for testing (biopsy). If small growths are found, your doctor may remove them  and have them checked for cancer. The tube will be slowly removed. The procedure may vary among doctors and hospitals. What happens after the procedure? You will be monitored until you leave the hospital or clinic. This includes checking your: Blood pressure. Heart rate. Breathing rate. Blood oxygen level. You may have a small amount of blood in your poop. You may pass gas. You may have mild cramps or bloating in your belly (abdomen). Do not drive for 24 hours after the procedure. It is up to you to get the results of your procedure. Ask your doctor, or the department that is doing the procedure, when your results will be ready. Summary A colonoscopy is an exam to look at the large intestine. Follow instructions from your doctor about eating and drinking before the procedure. You may be prescribed an oral bowel prep to clean out your colon. Take it as told by your doctor. A flexible tube with a camera on its end will be put into the opening of the butt. It will be passed into the large intestine. You will be monitored until you leave the hospital or clinic. This information is not intended to replace advice given to you by your health care provider. Make sure you discuss any questions you have with your healthcare provider. Document Revised: 11/29/2018 Document Reviewed: 11/29/2018 [employer] Patient Education  [employer].

## 2020-11-04 NOTE — Assessment & Plan Note (Signed)
Continue to collaborate with endocrinology. 

## 2020-11-04 NOTE — Assessment & Plan Note (Signed)
Chronic, stable.  Continue current medication regimen and adjust as needed.  Uric acid level today.  May need to discontinue HCTZ in future if return of flares presents.

## 2020-11-04 NOTE — Progress Notes (Signed)
BP 128/72 (BP Location: Left Arm)   Pulse 64   Temp 98.3 F (36.8 C) (Oral)   Ht 6\' 1"  (1.854 m)   Wt (!) 327 lb 6.4 oz (148.5 kg)   SpO2 97%   BMI 43.20 kg/m    Subjective:    Patient ID: Aaron [name], male    DOB: November 18, 1950, 71 y.o.   MRN: [id number]  HPI: Aaron [name] is a 70 y.o. male presenting on 11/04/2020 for comprehensive medical examination. Current medical complaints include:none  He currently lives with: wife Interim Problems from his last visit: no   DIABETES Is followed by Dr. Gabriel Carina and last saw in December.  A1C at that time was 7.6%.  Uses insulin pump, per Dr. Gabriel Carina last note 08/26/19: "Medtronic MiniMed 630G insulin pump. His insulin pump  current settings: Basal rates Correction factor Carb ratio Target BG 12 am 4.7 units/hr 10 4 110 3 am 4.0 units/hr 10 4 110 8:30 am 2 units/hr 10 4 110 11 am 3 units.hr 10 4 110 1 pm 3 units/hr 10 3 110 2 pm 4 units/hr 10 3 110  24-hr basal = 90.1 units NovoLog"   Hypoglycemic episodes: had 69 last night, was sleeping Polydipsia/polyuria: no Visual disturbance: no Chest pain: no Paresthesias: no Glucose Monitoring: yes             Accucheck frequency: QID             Fasting glucose: <130 on average --- highest has been in 140's             Post prandial: 140 on average, varies             Evening: < 130, varies             Before meals: < 130 on average Taking Insulin?: yes             Long acting insulin:             Short acting insulin: Blood Pressure Monitoring: not checking Retinal Examination: Up to Date Foot Exam: Up to Date Pneumovax: Up to Date Influenza: Up to Date Aspirin: yes    HYPERTENSION / HYPERLIPIDEMIA Continues on Losartan 100 MG, HCTZ 25 MG daily, Carvedilol 25 MG BID, Amlodipine 10 MG daily.  Takes Lipitor 20 MG.  Has OSA and uses CPAP every night.  Followed by urology -- history of hematuria and prostate CA == last seen 07/16/20 -- removed prostate in 2013.  Satisfied with  current treatment? yes Duration of hypertension: chronic BP monitoring frequency: occasional BP range: 130/70-80 BP medication side effects: no Duration of hyperlipidemia: chronic Cholesterol medication side effects: no Cholesterol supplements: none Medication compliance: good compliance Aspirin: yes Recent stressors: no Recurrent headaches: no Visual changes: no Palpitations: no Dyspnea: no Chest pain: no Lower extremity edema: no Dizzy/lightheaded: no    GOUT Continues on Allopurinol 300 MG daily.  Has not had a flare in years.   Swelling: no Redness: no Trauma: no Recent dietary change or indiscretion: no Fevers: no Nausea/vomiting: no Aggravating factors: foods Alleviating factors: Allopurinol Status:  stable Treatments attempted: Allopurinol   Functional Status Survey: Is the patient deaf or have difficulty hearing?: No Does the patient have difficulty seeing, even when wearing glasses/contacts?: No Does the patient have difficulty concentrating, remembering, or making decisions?: No Does the patient have difficulty walking or climbing stairs?: No Does the patient have difficulty dressing or bathing?: No Does the patient have difficulty doing  errands alone such as visiting a doctor's office or shopping?: No  FALL RISK: Fall Risk  07/15/2020 06/03/2020 05/04/2020 10/31/2019 10/15/2019  Falls in the past year? 0 0 0 0 0  Number falls in past yr: - - 0 0 0  Injury with Fall? - - 0 0 0  Follow up - - Falls evaluation completed - -    Depression Screen Depression screen [facility] 2/9 11/04/2020 10/31/2019 10/15/2019 10/23/2018 08/19/2018  Decreased Interest 0 0 0 0 0  Down, Depressed, Hopeless 0 0 0 0 0  PHQ - 2 Score 0 0 0 0 0  Altered sleeping - 0 - - 0  Tired, decreased energy - 0 - - 0  Change in appetite - 0 - - 0  Feeling bad or failure about yourself  - 0 - - 0  Trouble concentrating - 0 - - 0  Moving slowly or fidgety/restless - 0 - - 0  Suicidal thoughts - 0 - - 0   PHQ-9 Score - 0 - - 0    Advanced Directives <no information>  Past Medical History:  Past Medical History:  Diagnosis Date   Cancer ([place]) 2013   prostate (radiation and surgery)   Diabetes mellitus without complication ([place])    Gout    Hyperlipidemia    Hypertension    Sleep apnea    Spinal stenosis in cervical region 06/03/2020    Surgical History:  Past Surgical History:  Procedure Laterality Date   CIRCUMCISION     CYST EXCISION Left 2015   arm   deviated septrum  2003   JOINT REPLACEMENT Left 2016   Hip   PROSTATE SURGERY     prostatectomy due to cancer   PROSTATECTOMY  2013   TONSILLECTOMY AND ADENOIDECTOMY  2004   TOTAL HIP ARTHROPLASTY Left 2016   TOTAL HIP ARTHROPLASTY Right 11/27/2017   Procedure: TOTAL HIP ARTHROPLASTY ANTERIOR APPROACH;  Surgeon: Hessie Knows, MD;  Location: [facility] ORS;  Service: Orthopedics;  Laterality: Right;    Medications:  Current Outpatient Medications on File Prior to Visit  Medication Sig   ACCU-CHEK GUIDE test strip    acetaminophen (TYLENOL) 500 MG tablet Take 1-2 tablets (500-1,000 mg total) by mouth every 6 (six) hours as needed for mild pain (pain score 1-3 or temp > 100.5).   allopurinol (ZYLOPRIM) 300 MG tablet TAKE 1 TABLET EVERY DAY   amLODipine (NORVASC) 10 MG tablet TAKE 1 TABLET EVERY DAY   ASPIRIN 81 PO every evening.    atorvastatin (LIPITOR) 20 MG tablet TAKE 1 TABLET EVERY DAY   carvedilol (COREG) 25 MG tablet Take 1 tablet (25 mg total) by mouth 2 (two) times daily with a meal.   gabapentin (NEURONTIN) 600 MG tablet Take 1-1.5 tablets (600-900 mg total) by mouth at bedtime.   hydrochlorothiazide (HYDRODIURIL) 25 MG tablet TAKE 1 TABLET EVERY DAY   insulin aspart (NOVOLOG) 100 UNIT/ML injection Inject into the skin. Using via insulin pump  Medtronic MiniMed 630G insulin pump   Insulin Human (INSULIN PUMP) SOLN Inject into the skin.   losartan (COZAAR) 100 MG tablet TAKE 1 TABLET EVERY DAY   methocarbamol (ROBAXIN)  750 MG tablet Take 1 tablet (750 mg total) by mouth every 12 (twelve) hours as needed for muscle spasms.   tamsulosin (FLOMAX) 0.4 MG CAPS capsule Take 1 capsule (0.4 mg total) by mouth daily.   No current facility-administered medications on file prior to visit.    Allergies:  Allergies  Allergen Reactions  Glucophage [Metformin] Diarrhea    Social History:  Social History   Socioeconomic History   Marital status: Married    Spouse name: Not on file   Number of children: Not on file   Years of education: Not on file   Highest education level: Some college, no degree  Occupational History   Occupation: retired  Tobacco Use   Smoking status: Never   Smokeless tobacco: Never  Vaping Use   Vaping Use: Never used  Substance and Sexual Activity   Alcohol use: No   Drug use: No   Sexual activity: Yes  Other Topics Concern   Not on file  Social History Narrative   Not on file   Social Determinants of Health   Financial Resource Strain: Medium Risk   Difficulty of Paying Living Expenses: Somewhat hard  Food Insecurity: Not on file  Transportation Needs: Not on file  Physical Activity: Not on file  Stress: Not on file  Social Connections: Not on file  Intimate Partner Violence: Not on file   Social History   Tobacco Use  Smoking Status Never  Smokeless Tobacco Never   Social History   Substance and Sexual Activity  Alcohol Use No    Family History:  Family History  Problem Relation Age of Onset   Cancer Mother    Breast cancer Mother    Stroke Father    Cancer Sister    Cancer Maternal Uncle    Stroke Maternal Uncle     Past medical history, surgical history, medications, allergies, family history and social history reviewed with patient today and changes made to appropriate areas of the chart.   Review of Systems - negative All other ROS negative except what is listed above and in the HPI.      Objective:    BP 128/72 (BP Location: Left Arm)    Pulse 64   Temp 98.3 F (36.8 C) (Oral)   Ht 6\' 1"  (1.854 m)   Wt (!) 327 lb 6.4 oz (148.5 kg)   SpO2 97%   BMI 43.20 kg/m   Wt Readings from Last 3 Encounters:  11/04/20 (!) 327 lb 6.4 oz (148.5 kg)  08/04/20 (!) 327 lb 9.6 oz (148.6 kg)  07/16/20 (!) 325 lb (147.4 kg)    Physical Exam Vitals and nursing note reviewed.  Constitutional:      General: He is awake. He is not in acute distress.    Appearance: He is well-developed. He is morbidly obese. He is not ill-appearing.  HENT:     Head: Normocephalic and atraumatic.     Right Ear: Hearing, tympanic membrane, ear canal and external ear normal. No drainage.     Left Ear: Hearing, tympanic membrane, ear canal and external ear normal. No drainage.     Nose: Nose normal.     Mouth/Throat:     Pharynx: Uvula midline.  Eyes:     General: Lids are normal.        Right eye: No discharge.        Left eye: No discharge.     Extraocular Movements: Extraocular movements intact.     Conjunctiva/sclera: Conjunctivae normal.     Pupils: Pupils are equal, round, and reactive to light.     Visual Fields: Right eye visual fields normal and left eye visual fields normal.  Neck:     Thyroid: No thyromegaly.     Vascular: No carotid bruit or JVD.     Trachea: Trachea normal.  Cardiovascular:     Rate and Rhythm: Normal rate and regular rhythm.     Heart sounds: Normal heart sounds, S1 normal and S2 normal. No murmur heard.   No gallop.  Pulmonary:     Effort: Pulmonary effort is normal. No accessory muscle usage or respiratory distress.     Breath sounds: Normal breath sounds.  Abdominal:     General: Bowel sounds are normal.     Palpations: Abdomen is soft. There is no hepatomegaly or splenomegaly.     Tenderness: There is no abdominal tenderness.  Musculoskeletal:        General: Normal range of motion.     Cervical back: Normal range of motion and neck supple.     Right lower leg: No edema.     Left lower leg: No edema.   Lymphadenopathy:     Head:     Right side of head: No submental, submandibular, tonsillar, preauricular or posterior auricular adenopathy.     Left side of head: No submental, submandibular, tonsillar, preauricular or posterior auricular adenopathy.     Cervical: No cervical adenopathy.  Skin:    General: Skin is warm and dry.     Capillary Refill: Capillary refill takes less than 2 seconds.     Findings: No rash.  Neurological:     Mental Status: He is alert and oriented to person, place, and time.     Cranial Nerves: Cranial nerves are intact.     Gait: Gait is intact.     Deep Tendon Reflexes: Reflexes are normal and symmetric.     Reflex Scores:      Brachioradialis reflexes are 2+ on the right side and 2+ on the left side.      Patellar reflexes are 2+ on the right side and 2+ on the left side. Psychiatric:        Attention and Perception: Attention normal.        Mood and Affect: Mood normal.        Speech: Speech normal.        Behavior: Behavior normal. Behavior is cooperative.        Thought Content: Thought content normal.        Cognition and Memory: Cognition normal.        Judgment: Judgment normal.   Results for orders placed or performed in visit on 08/31/20  HM DIABETES EYE EXAM  Result Value Ref Range   HM Diabetic Eye Exam No Retinopathy No Retinopathy      Assessment & Plan:   Problem List Items Addressed This Visit       Cardiovascular and Mediastinum   Hypertension associated with diabetes ([place])    Chronic, ongoing.  BP at goal today on recheck and mainly at goal on home readings.  Recommend he continue to check BP at home twice daily and document for provider visits. Continue current medication regimen at this time, is on max dose multiple medications.  May need to adjust in future and change HCTZ if return of gout flares presents.  CMP and TSH today.  Return in 3 months for follow-up.       Relevant Orders   TSH   Aortic atherosclerosis ([place])     Noted on 09/16/19 imaging.  Continue daily statin and ASA for prevention + focus on healthy diet and modest weight loss.         Respiratory   Sleep apnea    Continues to use CPAP faithfully every night  and denies any issues with this.         Endocrine   Type 2 diabetes mellitus with proteinuria ([facility]) - Primary    Chronic, ongoing, followed by endocrinology with insulin pump in place.  Continue current collaboration and pump settings per endo -- recent note reviewed.  A1C with endo recently 7.6% -- urine ALB 30 in March 2022, continue Losartan for kidney protection.  Recommend focus on diet and modest weight loss.  Does endorse poor diet on occasion.  Recommend he continue to monitor BS closely at home and if any consistent <70 immediately alert endo for pump changes.  Return in 3 months.       Hyperlipidemia associated with type 2 diabetes mellitus ([facility])    Chronic, ongoing.  Continue current medication regimen and adjust as needed.  Lipid panel today.          Relevant Orders   Lipid Panel w/o Chol/HDL Ratio   Comprehensive metabolic panel   Type 2 diabetes mellitus with both eyes affected by mild nonproliferative retinopathy without macular edema, with long-term current use of insulin ([facility])    Chronic, ongoing, followed by endocrinology with insulin pump in place + followed by Dr. Matilde Sprang for eye exams.  Continue current collaboration and pump settings per endo.  A1C recently 7.6%.  Recommend focus on diet and modest weight loss.  Does endorse poor diet.  Recommend he continue to monitor BS closely at home and if any consistent <70 immediately alert endo for pump changes.  Return in 3 months.         Genitourinary   Asymptomatic microscopic hematuria    Followed by urology and improved at this time.         Other   Gout    Chronic, stable.  Continue current medication regimen and adjust as needed.  Uric acid level today.  May need to discontinue HCTZ in future if return of  flares presents.       Relevant Orders   Uric acid   History of prostate cancer    Continue to collaborate with urology as needed.  PSA today to ensure stable.       Relevant Orders   PSA   BMI 40.0-44.9, adult ([place])    Recommend focus on modest weight loss with diet changes and regular exercise regimen.         Morbid obesity ([facility])    BMI 43.20 with T2DM, HTN.  Recommended eating smaller high protein, low fat meals more frequently and exercising 30 mins a day 5 times a week with a goal of 10-15lb weight loss in the next 3 months. Patient voiced their understanding and motivation to adhere to these recommendations.        Presence of insulin pump    Continue to collaborate with endocrinology.       Other Visit Diagnoses     Colon cancer screening       GI referral placed   Relevant Orders   Ambulatory referral to Gastroenterology   Annual physical exam       Annual labs today obtained and health maintenance reviewed -- colonoscopy ordered and Shingrix ordered.       Discussed aspirin prophylaxis for myocardial infarction prevention and decision was made to continue ASA  LABORATORY TESTING:  Health maintenance labs ordered today as discussed above.   IMMUNIZATIONS:   - Tdap: Tetanus vaccination status reviewed: last tetanus booster within 10 years. - Influenza: Up to date - Pneumovax:  Up to date - Prevnar: Up to date - Zostavax vaccine: ordered to pharmacy  SCREENING: - Colonoscopy: Ordered today Discussed with patient purpose of the colonoscopy is to detect colon cancer at curable precancerous or early stages   - AAA Screening: Not applicable  -Hearing Test: Not applicable  -Spirometry: Not applicable   PATIENT COUNSELING:    Sexuality: Discussed sexually transmitted diseases, partner selection, use of condoms, avoidance of unintended pregnancy  and contraceptive alternatives.   Advised to avoid cigarette smoking.  I discussed with the patient that  most people either abstain from alcohol or drink within safe limits (<=14/week and <=4 drinks/occasion for males, <=7/weeks and <= 3 drinks/occasion for females) and that the risk for alcohol disorders and other health effects rises proportionally with the number of drinks per week and how often a drinker exceeds daily limits.  Discussed cessation/primary prevention of drug use and availability of treatment for abuse.   Diet: Encouraged to adjust caloric intake to maintain  or achieve ideal body weight, to reduce intake of dietary saturated fat and total fat, to limit sodium intake by avoiding high sodium foods and not adding table salt, and to maintain adequate dietary potassium and calcium preferably from fresh fruits, vegetables, and low-fat dairy products.    Stressed the importance of regular exercise  Injury prevention: Discussed safety belts, safety helmets, smoke detector, smoking near bedding or upholstery.   Dental health: Discussed importance of regular tooth brushing, flossing, and dental visits.   Follow up plan: NEXT PREVENTATIVE PHYSICAL DUE IN 1 YEAR. Return in about 3 months (around 02/04/2021) for T2DM, HTN/HLD, GOUT.

## 2020-11-04 NOTE — Assessment & Plan Note (Signed)
Followed by urology and improved at this time.

## 2020-11-04 NOTE — Assessment & Plan Note (Signed)
Chronic, ongoing, followed by endocrinology with insulin pump in place + followed by Dr. Matilde Sprang for eye exams.  Continue current collaboration and pump settings per endo.  A1C recently 7.6%.  Recommend focus on diet and modest weight loss.  Does endorse poor diet.  Recommend he continue to monitor BS closely at home and if any consistent <70 immediately alert endo for pump changes.  Return in 3 months.

## 2020-11-05 ENCOUNTER — Encounter: Payer: Self-pay | Admitting: Nurse Practitioner

## 2020-11-05 DIAGNOSIS — E1122 Type 2 diabetes mellitus with diabetic chronic kidney disease: Secondary | ICD-10-CM | POA: Insufficient documentation

## 2020-11-05 LAB — LIPID PANEL W/O CHOL/HDL RATIO
Cholesterol, Total: 97 mg/dL — ABNORMAL LOW (ref 100–199)
HDL: 40 mg/dL
LDL Chol Calc (NIH): 44 mg/dL (ref 0–99)
Triglycerides: 55 mg/dL (ref 0–149)
VLDL Cholesterol Cal: 13 mg/dL (ref 5–40)

## 2020-11-05 LAB — COMPREHENSIVE METABOLIC PANEL
ALT: 12 IU/L (ref 0–44)
AST: 17 IU/L (ref 0–40)
Albumin/Globulin Ratio: 1.6 (ref 1.2–2.2)
Albumin: 4.4 g/dL (ref 3.8–4.8)
Alkaline Phosphatase: 169 IU/L — ABNORMAL HIGH (ref 44–121)
BUN/Creatinine Ratio: 16 (ref 10–24)
BUN: 25 mg/dL (ref 8–27)
Bilirubin Total: 0.3 mg/dL (ref 0.0–1.2)
CO2: 27 mmol/L (ref 20–29)
Calcium: 9.4 mg/dL (ref 8.6–10.2)
Chloride: 101 mmol/L (ref 96–106)
Creatinine, Ser: 1.53 mg/dL — ABNORMAL HIGH (ref 0.76–1.27)
Globulin, Total: 2.7 g/dL (ref 1.5–4.5)
Glucose: 124 mg/dL — ABNORMAL HIGH (ref 65–99)
Potassium: 4.6 mmol/L (ref 3.5–5.2)
Sodium: 143 mmol/L (ref 134–144)
Total Protein: 7.1 g/dL (ref 6.0–8.5)
eGFR: 49 mL/min/{1.73_m2} — ABNORMAL LOW (ref >59)

## 2020-11-05 LAB — TSH: TSH: 3.67 u[IU]/mL (ref 0.450–4.500)

## 2020-11-05 LAB — URIC ACID: Uric Acid: 5.1 mg/dL (ref 3.8–8.4)

## 2020-11-05 LAB — PSA: Prostate Specific Ag, Serum: <0.1 ng/mL (ref 0.0–4.0)

## 2020-11-05 NOTE — Progress Notes (Signed)
Contacted via [place] afternoon [place], your labs have returned and overall they remain stable with exception of kidney function.  Your creatinine is slightly elevated and eGFR slightly low showing chronic kidney disease stage 3a being present.  We will continue to monitor this closely to ensure no decline.  Continue your Losartan for kidney protection and ensure good water intake daily + good sugar control.  Any questions? Keep being awesome!!  Thank you for allowing me to participate in your care.  I appreciate you. Kindest regards, Ariadne Rissmiller

## 2020-11-09 ENCOUNTER — Ambulatory Visit
Payer: Medicare HMO | Attending: Student in an Organized Health Care Education/Training Program | Admitting: Student in an Organized Health Care Education/Training Program

## 2020-11-09 ENCOUNTER — Encounter: Payer: Self-pay | Admitting: Student in an Organized Health Care Education/Training Program

## 2020-11-09 ENCOUNTER — Other Ambulatory Visit: Payer: Self-pay

## 2020-11-09 VITALS — BP 123/68 | HR 57 | Temp 97.2°F | Resp 16 | Ht 75.0 in | Wt 327.0 lb

## 2020-11-09 DIAGNOSIS — G894 Chronic pain syndrome: Secondary | ICD-10-CM

## 2020-11-09 DIAGNOSIS — M4802 Spinal stenosis, cervical region: Secondary | ICD-10-CM

## 2020-11-09 DIAGNOSIS — M47812 Spondylosis without myelopathy or radiculopathy, cervical region: Secondary | ICD-10-CM | POA: Diagnosis not present

## 2020-11-09 NOTE — Progress Notes (Signed)
Safety precautions to be maintained throughout the outpatient stay will include: orient to surroundings, keep bed in low position, maintain call bell within reach at all times, provide assistance with transfer out of bed and ambulation.  

## 2020-11-09 NOTE — Patient Instructions (Signed)
Moderate Conscious Sedation, Adult Sedation is the use of medicines to promote relaxation and to relieve discomfort and anxiety. Moderate conscious sedation is a type of sedation. Under moderate conscious sedation, you are less alert than normal, but you arestill able to respond to instructions, touch, or both. Moderate conscious sedation is used during short medical and dental procedures. It is milder than deep sedation, which is a type of sedation under which you cannot be easily woken up. It is also milder than general anesthesia, which is the use of medicines to make you unconscious. Moderate conscious sedationallows you to return to your regular activities sooner. Tell a health care provider about: Any allergies you have. All medicines you are taking, including vitamins, herbs, eye drops, creams, and over-the-counter medicines. Any use of steroids. This includes steroids taken by mouth or as a cream. Any problems you or family members have had with sedatives and anesthetic medicines. Any blood disorders you have. Any surgeries you have had. Any medical conditions you have, such as sleep apnea. Whether you are pregnant or may be pregnant. Any use of cigarettes, alcohol, marijuana, or drugs. What are the risks? Generally, this is a safe procedure. However, problems may occur, including: Getting too much medicine (oversedation). Nausea. Allergic reaction to medicines. Trouble breathing. If this happens, a breathing tube may be used. It will be removed when you are awake and breathing on your own. Heart trouble. Lung trouble. Confusion that gets better with time (emergence delirium). What happens before the procedure? Staying hydrated Follow instructions from your health care provider about hydration, which may include: Up to 2 hours before the procedure - you may continue to drink clear liquids, such as water, clear fruit juice, black coffee, and plain tea. Eating and drinking  restrictions Follow instructions from your health care provider about eating and drinking, which may include: 8 hours before the procedure - stop eating heavy meals or foods, such as meat, fried foods, or fatty foods. 6 hours before the procedure - stop eating light meals or foods, such as toast or cereal. 6 hours before the procedure - stop drinking milk or drinks that contain milk. 2 hours before the procedure - stop drinking clear liquids. Medicines Ask your health care provider about: Changing or stopping your regular medicines. This is especially important if you are taking diabetes medicines or blood thinners. Taking medicines such as aspirin and ibuprofen. These medicines can thin your blood. Do not take these medicines unless your health care provider tells you to take them. Taking over-the-counter medicines, vitamins, herbs, and supplements. Tests and exams You will have a physical exam. You may have blood tests done to show how well: Your kidneys and liver work. Your blood clots. General instructions Plan to have a responsible adult take you home from the hospital or clinic. If you will be going home right after the procedure, plan to have a responsible adult care for you for the time you are told. This is important. What happens during the procedure?  You will be given the sedative. The sedative may be given: As a pill that you will swallow. It can also be inserted into the rectum. As a spray through the nose. As an injection into the muscle. As an injection into the vein through an IV. You may be given oxygen as needed. Your breathing, heart rate, and blood pressure will be monitored during the procedure. The medical or dental procedure will be done. The procedure may vary among health care providers  and hospitals. What happens after the procedure? Your blood pressure, heart rate, breathing rate, and blood oxygen level will be monitored until you leave the hospital or  clinic. You will get fluids through your IV if needed. Do not drive or operate machinery until your health care provider says that it is safe. Summary Sedation is the use of medicines to promote relaxation and to relieve discomfort and anxiety. Moderate conscious sedation is a type of sedation that is used during short medical and dental procedures. Tell the health care provider about any medical conditions that you have and about all the medicines that you are taking. You will be given the sedative as a pill, a spray through the nose, an injection into the muscle, or an injection into the vein through an IV. Vital signs are monitored during the sedation. Moderate conscious sedation allows you to return to your regular activities sooner. This information is not intended to replace advice given to you by your health care provider. Make sure you discuss any questions you have with your healthcare provider. Document Revised: 09/05/2019 Document Reviewed: 04/03/2019 Elsevier Patient Education  2022 [employer]  What are the risk, side effects and possible complications? Generally speaking, most procedures are safe.  However, with any procedure there are risks, side effects, and the possibility of complications.  The risks and complications are dependent upon the sites that are lesioned, or the type of nerve block to be performed.  The closer the procedure is to the spine, the more serious the risks are.  Great care is taken when placing the radio frequency needles, block needles or lesioning probes, but sometimes complications can occur. Infection: Any time there is an injection through the skin, there is a risk of infection.  This is why sterile conditions are used for these blocks.  There are four possible types of infection. Localized skin infection. Central Nervous System Infection-This can be in the form of Meningitis, which can be deadly. Epidural  Infections-This can be in the form of an epidural abscess, which can cause pressure inside of the spine, causing compression of the spinal cord with subsequent paralysis. This would require an emergency surgery to decompress, and there are no guarantees that the patient would recover from the paralysis. Discitis-This is an infection of the intervertebral discs.  It occurs in about 1% of discography procedures.  It is difficult to treat and it may lead to surgery.        2. Pain: the needles have to go through skin and soft tissues, will cause soreness.       3. Damage to internal structures:  The nerves to be lesioned may be near blood vessels or    other nerves which can be potentially damaged.       4. Bleeding: Bleeding is more common if the patient is taking blood thinners such as  aspirin, Coumadin, Ticiid, Plavix, etc., or if he/she have some genetic predisposition  such as hemophilia. Bleeding into the spinal canal can cause compression of the spinal  cord with subsequent paralysis.  This would require an emergency surgery to  decompress and there are no guarantees that the patient would recover from the  paralysis.       5. Pneumothorax:  Puncturing of a lung is a possibility, every time a needle is introduced in  the area of the chest or upper back.  Pneumothorax refers to free air around the  collapsed lung(s), inside of the  thoracic cavity (chest cavity).  Another two possible  complications related to a similar event would include: Hemothorax and Chylothorax.   These are variations of the Pneumothorax, where instead of air around the collapsed  lung(s), you may have blood or chyle, respectively.       6. Spinal headaches: They may occur with any procedures in the area of the spine.       7. Persistent CSF (Cerebro-Spinal Fluid) leakage: This is a rare problem, but may occur  with prolonged intrathecal or epidural catheters either due to the formation of a fistulous  track or a dural tear.        8. Nerve damage: By working so close to the spinal cord, there is always a possibility of  nerve damage, which could be as serious as a permanent spinal cord injury with  paralysis.       9. Death:  Although rare, severe deadly allergic reactions known as "Anaphylactic  reaction" can occur to any of the medications used.      10. Worsening of the symptoms:  We can always make thing worse.  What are the chances of something like this happening? Chances of any of this occuring are extremely low.  By statistics, you have more of a chance of getting killed in a motor vehicle accident: while driving to the hospital than any of the above occurring .  Nevertheless, you should be aware that they are possibilities.  In general, it is similar to taking a shower.  Everybody knows that you can slip, hit your head and get killed.  Does that mean that you should not shower again?  Nevertheless always keep in mind that statistics do not mean anything if you happen to be on the wrong side of them.  Even if a procedure has a 1 (one) in a 1,000,000 (million) chance of going wrong, it you happen to be that one..Also, keep in mind that by statistics, you have more of a chance of having something go wrong when taking medications.  Who should not have this procedure? If you are on a blood thinning medication (e.g. Coumadin, Plavix, see list of "Blood Thinners"), or if you have an active infection going on, you should not have the procedure.  If you are taking any blood thinners, please inform your physician.  How should I prepare for this procedure? Do not eat or drink anything at least six hours prior to the procedure. Bring a driver with you .  It cannot be a taxi. Come accompanied by an adult that can drive you back, and that is strong enough to help you if your legs get weak or numb from the local anesthetic. Take all of your medicines the morning of the procedure with just enough water to swallow them. If you have  diabetes, make sure that you are scheduled to have your procedure done first thing in the morning, whenever possible. If you have diabetes, take only half of your insulin dose and notify our nurse that you have done so as soon as you arrive at the clinic. If you are diabetic, but only take blood sugar pills (oral hypoglycemic), then do not take them on the morning of your procedure.  You may take them after you have had the procedure. Do not take aspirin or any aspirin-containing medications, at least eleven (11) days prior to the procedure.  They may prolong bleeding. Wear loose fitting clothing that may be easy to take off and that  you would not mind if it got stained with Betadine or blood. Do not wear any jewelry or perfume Remove any nail coloring.  It will interfere with some of our monitoring equipment.  NOTE: Remember that this is not meant to be interpreted as a complete list of all possible complications.  Unforeseen problems may occur.  BLOOD THINNERS The following drugs contain aspirin or other products, which can cause increased bleeding during surgery and should not be taken for 2 weeks prior to and 1 week after surgery.  If you should need take something for relief of minor pain, you may take acetaminophen which is found in Tylenol,m Datril, Anacin-3 and Panadol. It is not blood thinner. The products listed below are.  Do not take any of the products listed below in addition to any listed on your instruction sheet.  A.P.C or A.P.C with Codeine Codeine Phosphate Capsules #3 Ibuprofen Ridaura  ABC compound Congesprin Imuran rimadil  Advil Cope Indocin Robaxisal  Alka-Seltzer Effervescent Pain Reliever and Antacid Coricidin or Coricidin-D  Indomethacin Rufen  Alka-Seltzer plus Cold Medicine Cosprin Ketoprofen S-A-C Tablets  Anacin Analgesic Tablets or Capsules Coumadin Korlgesic Salflex  Anacin Extra Strength Analgesic tablets or capsules CP-2 Tablets Lanoril Salicylate  Anaprox  Cuprimine Capsules Levenox Salocol  Anexsia-D Dalteparin Magan Salsalate  Anodynos Darvon compound Magnesium Salicylate Sine-off  Ansaid Dasin Capsules Magsal Sodium Salicylate  Anturane Depen Capsules Marnal Soma  APF Arthritis pain formula Dewitt's Pills Measurin Stanback  Argesic Dia-Gesic Meclofenamic Sulfinpyrazone  Arthritis Bayer Timed Release Aspirin Diclofenac Meclomen Sulindac  Arthritis pain formula Anacin Dicumarol Medipren Supac  Analgesic (Safety coated) Arthralgen Diffunasal Mefanamic Suprofen  Arthritis Strength Bufferin Dihydrocodeine Mepro Compound Suprol  Arthropan liquid Dopirydamole Methcarbomol with Aspirin Synalgos  ASA tablets/Enseals Disalcid Micrainin Tagament  Ascriptin Doan's Midol Talwin  Ascriptin A/D Dolene Mobidin Tanderil  Ascriptin Extra Strength Dolobid Moblgesic Ticlid  Ascriptin with Codeine Doloprin or Doloprin with Codeine Momentum Tolectin  Asperbuf Duoprin Mono-gesic Trendar  Aspergum Duradyne Motrin or Motrin IB Triminicin  Aspirin plain, buffered or enteric coated Durasal Myochrisine Trigesic  Aspirin Suppositories Easprin Nalfon Trillsate  Aspirin with Codeine Ecotrin Regular or Extra Strength Naprosyn Uracel  Atromid-S Efficin Naproxen Ursinus  Auranofin Capsules Elmiron Neocylate Vanquish  Axotal Emagrin Norgesic Verin  Azathioprine Empirin or Empirin with Codeine Normiflo Vitamin E  Azolid Emprazil Nuprin Voltaren  Bayer Aspirin plain, buffered or children's or timed BC Tablets or powders Encaprin Orgaran Warfarin Sodium  Buff-a-Comp Enoxaparin Orudis Zorpin  Buff-a-Comp with Codeine Equegesic Os-Cal-Gesic   Buffaprin Excedrin plain, buffered or Extra Strength Oxalid   Bufferin Arthritis Strength Feldene Oxphenbutazone   Bufferin plain or Extra Strength Feldene Capsules Oxycodone with Aspirin   Bufferin with Codeine Fenoprofen Fenoprofen Pabalate or Pabalate-SF   Buffets II Flogesic Panagesic   Buffinol plain or Extra Strength Florinal  or Florinal with Codeine Panwarfarin   Buf-Tabs Flurbiprofen Penicillamine   Butalbital Compound Four-way cold tablets Penicillin   Butazolidin Fragmin Pepto-Bismol   Carbenicillin Geminisyn Percodan   Carna Arthritis Reliever Geopen Persantine   Carprofen Gold's salt Persistin   Chloramphenicol Goody's Phenylbutazone   Chloromycetin Haltrain Piroxlcam   Clmetidine heparin Plaquenil   Cllnoril Hyco-pap Ponstel   Clofibrate Hydroxy chloroquine Propoxyphen         Before stopping any of these medications, be sure to consult the physician who ordered them.  Some, such as Coumadin (Warfarin) are ordered to prevent or treat serious conditions such as "deep thrombosis", "pumonary embolisms", and other heart problems.  The amount of time that you may need off of the medication may also vary with the medication and the reason for which you were taking it.  If you are taking any of these medications, please make sure you notify your pain physician before you undergo any procedures.         Facet Blocks Patient Information  Description: The facets are joints in the spine between the vertebrae.  Like any joints in the body, facets can become irritated and painful.  Arthritis can also effect the facets.  By injecting steroids and local anesthetic in and around these joints, we can temporarily block the nerve supply to them.  Steroids act directly on irritated nerves and tissues to reduce selling and inflammation which often leads to decreased pain.  Facet blocks may be done anywhere along the spine from the neck to the low back depending upon the location of your pain.   After numbing the skin with local anesthetic (like Novocaine), a small needle is passed onto the facet joints under x-ray guidance.  You may experience a sensation of pressure while this is being done.  The entire block usually lasts about 15-25 minutes.   Conditions which may be treated by facet blocks:  Low back/buttock  pain Neck/shoulder pain Certain types of headaches  Preparation for the injection:  Do not eat any solid food or dairy products within 8 hours of your appointment. You may drink clear liquid up to 3 hours before appointment.  Clear liquids include water, black coffee, juice or soda.  No milk or cream please. You may take your regular medication, including pain medications, with a sip of water before your appointment.  Diabetics should hold regular insulin (if taken separately) and take 1/2 normal NPH dose the morning of the procedure.  Carry some sugar containing items with you to your appointment. A driver must accompany you and be prepared to drive you home after your procedure. Bring all your current medications with you. An IV may be inserted and sedation may be given at the discretion of the physician. A blood pressure cuff, EKG and other monitors will often be applied during the procedure.  Some patients may need to have extra oxygen administered for a short period. You will be asked to provide medical information, including your allergies and medications, prior to the procedure.  We must know immediately if you are taking blood thinners (like Coumadin/Warfarin) or if you are allergic to IV iodine contrast (dye).  We must know if you could possible be pregnant.  Possible side-effects:  Bleeding from needle site Infection (rare, may require surgery) Nerve injury (rare) Numbness & tingling (temporary) Difficulty urinating (rare, temporary) Spinal headache (a headache worse with upright posture) Light-headedness (temporary) Pain at injection site (serveral days) Decreased blood pressure (rare, temporary) Weakness in arm/leg (temporary) Pressure sensation in back/neck (temporary)   Call if you experience:  Fever/chills associated with headache or increased back/neck pain Headache worsened by an upright position New onset, weakness or numbness of an extremity below the injection  site Hives or difficulty breathing (go to the emergency room) Inflammation or drainage at the injection site(s) Severe back/neck pain greater than usual New symptoms which are concerning to you  Please note:  Although the local anesthetic injected can often make your back or neck feel good for several hours after the injection, the pain will likely return. It takes 3-7 days for steroids to work.  You may not notice any pain relief for  at least one week.  If effective, we will often do a series of 2-3 injections spaced 3-6 weeks apart to maximally decrease your pain.  After the initial series, you may be a candidate for a more permanent nerve block of the facets.  If you have any questions, please call #[phone]) [facility]

## 2020-11-09 NOTE — Progress Notes (Signed)
PROVIDER NOTE: Information contained herein reflects review and annotations entered in association with encounter. Interpretation of such information and data should be left to medically-trained personnel. Information provided to patient can be located elsewhere in the medical record under "Patient Instructions". Document created using STT-dictation technology, any transcriptional errors that may result from process are unintentional.    Patient: Aaron [name]  Service Category: E/M  Provider: Gillis Santa, MD  DOB: [birth year]  DOS: 11/09/2020  Specialty: Interventional Pain Management  MRN: [id number]  Setting: Ambulatory outpatient  PCP: Aaron Lick, NP  Type: Established Patient    Referring Provider: Venita Lick, NP  Location: Office  Delivery: Face-to-face     HPI  [name], a 70 y.o. year old male, is here today because of his Spinal stenosis in cervical region [M48.02]. [name] primary complain today is Neck Pain Last encounter: My last encounter with him was on 07/15/20 Pertinent problems: [name] has Hyperlipidemia associated with type 2 diabetes mellitus ([place]); S/P hip replacement; Status post total hip replacement, right; Spinal stenosis in cervical region; and Chronic pain syndrome on their pertinent problem list. Pain Assessment: Severity of Chronic pain is reported as a 2 /10. Location: Neck  /denies. Onset: More than a month ago. Quality: Aching, Burning, Sharp, Shooting. Timing: Constant. Modifying factor(s): gabapentin. Vitals:  height is 6' 3" (1.905 m) and weight is 327 lb (148.3 kg) (abnormal). His temporal temperature is 97.2 F (36.2 C) (abnormal). His blood pressure is 123/68 and his pulse is 57 (abnormal). His respiration is 16 and oxygen saturation is 100%.   Reason for encounter: medication management.    Patient presents today for medication management.  No real benefit with Robaxin.  He has tried Flexeril and tizanidine without any  significant response either.  He has difficulty with cervical extension.  We discussed cervical medial branch nerve blocks provided the patient additional details on this.  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  He continues gabapentin 600 mg to 900 mg nightly.  He does not need a refill.  He is status post 10 bursa injection of his right hip.  07/15/20: Patient presents today for medication management. No significant change in his medical history since her last visit. He states that the gabapentin is helping to manage his pain and is also helping with sleep. He is taking 600 mg at night without any side effects. He does not find much relief from the tizanidine when he has muscle spasms. We discussed transitioning to Robaxin and seeing if a trial that was more effective. Otherwise I recommend he consider increasing his gabapentin to 900 mg nightly. If worsening cervical pain, have discussed diagnostic cervical facet medial branch nerve blocks and potentially therapeutic radiofrequency ablation. Overall neck pain is well managed so do not need to pursue interventional treatments at this time left certainly an option for Mr [name].  "HPI 06/03/20: Aaron [name] is a pleasant 70 year old male who presents with a chief complaint of neck pain that radiates to bilateral shoulders as well as posterior dominant occipital headaches.  Patient has been evaluated by neurosurgery.  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  He is a English as a second language teacher.  He has tried physical therapy which was not helpful.  He says he takes his grandchildren to school so would like to avoid anything that can be sedating during the day.  He would like to avoid opioid analgesics.  He has tried  various anti-inflammatories in the past including Celebrex, Aleve, Mobic, naproxen with limited benefit.  He denies having trialed gabapentin, Lyrica, Cymbalta.  He has tried Flexeril but not Robaxin or tizanidine.  He has not had any sort of injections.  He states that he has limited ability to look right or left given severe pain.  He has also limited cervical extension.  No weakness of the upper extremity."  ROS  Constitutional: Denies any fever or chills Gastrointestinal: No reported hemesis, hematochezia, vomiting, or acute GI distress Musculoskeletal:  neck pain hip pain Neurological: No reported episodes of acute onset apraxia, aphasia, dysarthria, agnosia, amnesia, paralysis, loss of coordination, or loss of consciousness  Medication Review  Aspirin, Zoster Vaccine Adjuvanted, allopurinol, amLODipine, atorvastatin, carvedilol, gabapentin, glucose blood, hydrochlorothiazide, insulin aspart, insulin pump, losartan, and tamsulosin  History Review  Allergy: Aaron [name] is allergic to glucophage [metformin]. Drug: Aaron [name]  reports no history of drug use. Alcohol:  reports no history of alcohol use. Tobacco:  reports that he has never smoked. He has never used smokeless tobacco. Social: Aaron [name]  reports that he has never smoked. He has never used smokeless tobacco. He reports that he does not drink alcohol and does not use drugs. Medical:  has a past medical history of Cancer ([place]) (2013), Diabetes mellitus without complication ([place]), Gout, Hyperlipidemia, Hypertension, Sleep apnea, and Spinal stenosis in cervical region (06/03/2020). Surgical: Aaron [name]  has a past surgical history that includes deviated septrum (2003); Tonsillectomy and adenoidectomy (2004); Prostate surgery; Circumcision; Cyst excision (Left, 2015); Joint replacement (Left, 2016); Total hip arthroplasty (Left, 2016); Prostatectomy (2013); and Total hip arthroplasty (Right, 11/27/2017). Family: family history  includes Breast cancer in his mother; Cancer in his maternal uncle, mother, and sister; Stroke in his father and maternal uncle.  Laboratory Chemistry Profile   Renal Lab Results  Component Value Date   BUN 25 11/04/2020   CREATININE 1.53 (H) 11/04/2020   BCR 16 11/04/2020   GFRAA 74 05/04/2020   GFRNONAA 64 05/04/2020     Hepatic Lab Results  Component Value Date   AST 17 11/04/2020   ALT 12 11/04/2020   ALBUMIN 4.4 11/04/2020   ALKPHOS 169 (H) 11/04/2020     Electrolytes Lab Results  Component Value Date   NA 143 11/04/2020   K 4.6 11/04/2020   CL 101 11/04/2020   CALCIUM 9.4 11/04/2020     Bone No results found for: VD25OH, VD125OH2TOT, PY1950DT2, IZ1245YK9, 25OHVITD1, 25OHVITD2, 25OHVITD3, TESTOFREE, TESTOSTERONE   Inflammation (CRP: Acute Phase) (ESR: Chronic Phase) Lab Results  Component Value Date   ESRSEDRATE 43 (H) 11/14/2017       Note: Above Lab results reviewed.  Recent Imaging Review  CT HEMATURIA WORKUP CLINICAL DATA:  70 year old male with history of 3 days of gross hematuria 2 weeks ago. History of prostate cancer.  EXAM: CT ABDOMEN AND PELVIS WITHOUT AND WITH CONTRAST  TECHNIQUE: Multidetector CT imaging of the abdomen and pelvis was performed following the standard protocol before and following the bolus administration of intravenous contrast.  CONTRAST:  176m OMNIPAQUE IOHEXOL 300 MG/ML  SOLN  COMPARISON:  CT the abdomen and pelvis 01/02/2018.  FINDINGS: Lower chest: Atherosclerotic calcifications in the left anterior descending and right coronary arteries.  Hepatobiliary: Mild diffuse low attenuation throughout the hepatic parenchyma, indicative  of hepatic steatosis. No suspicious cystic or solid hepatic lesions. No intra or extrahepatic biliary ductal dilatation. 6 mm calcified gallstone lying dependently in the neck of the gallbladder. No findings to suggest an acute cholecystitis at this time.  Pancreas: No pancreatic mass.  No pancreatic ductal dilatation. No pancreatic or peripancreatic fluid collections or inflammatory changes.  Spleen: Unremarkable.  Adrenals/Urinary Tract: There are 2 tiny nonobstructive calculi in the right renal collecting system, largest of which measures only 3 mm in the interpolar region. No additional calculi are identified within the left renal collecting system, along the course of either ureter or within the lumen of the urinary bladder. No hydroureteronephrosis. No suspicious renal lesions. Postcontrast delayed images demonstrate no definite filling defect within the collecting system of either kidney, along the course of either ureter, or within the lumen of the urinary bladder to strongly suggest the presence of urothelial neoplasm at this time. Urinary bladder is nearly decompressed, but otherwise unremarkable in appearance.  Stomach/Bowel: Normal appearance of the stomach. No pathologic dilatation of small bowel or colon. A few scattered colonic diverticulae are noted, without surrounding inflammatory changes to suggest an acute diverticulitis at this time. Normal appendix.  Vascular/Lymphatic: Aortic atherosclerosis. No lymphadenopathy noted in the abdomen or pelvis.  Reproductive: Status post radical prostatectomy.  Other: No significant volume of ascites.  No pneumoperitoneum.  Musculoskeletal: Cavernous hemangioma of L1 vertebral body, similar to the prior study. Status post bilateral hip arthroplasty.  IMPRESSION: 1. Small nonobstructive calculi in the collecting system of the right kidney measuring up to 3 mm in the interpolar region. No ureteral stones or findings of urinary tract obstruction are noted at this time. 2. No definite evidence of metastatic disease in this patient with history of prostate cancer. 3. Mild hepatic steatosis. 4. Cholelithiasis without evidence of acute cholecystitis. 5. Aortic atherosclerosis, in addition to 2 vessel coronary  artery disease. Please note that although the presence of coronary artery calcium documents the presence of coronary artery disease, the severity of this disease and any potential stenosis cannot be assessed on this non-gated CT examination. Assessment for potential risk factor modification, dietary therapy or pharmacologic therapy may be warranted, if clinically indicated. 6. Additional incidental findings, similar to the prior study, as above.  Electronically Signed   By: Vinnie Langton M.D.   On: 09/17/2019 08:57 MR CERVICAL SPINE WO CONTRAST CLINICAL DATA:  Neck pain with bilateral shoulder pain  EXAM: MRI CERVICAL SPINE WITHOUT CONTRAST  TECHNIQUE: Multiplanar, multisequence MR imaging of the cervical spine was performed. No intravenous contrast was administered.  COMPARISON:  None.  FINDINGS: Alignment: Straightening of the cervical spine  Vertebrae: No fracture, evidence of discitis, or bone lesion.  Cord: Flattening at C2-3 and C3-4, without detected cord signal abnormality.  Posterior Fossa, vertebral arteries, paraspinal tissues: Negative.  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 This ossification was also seen on a 2018 maxillofacial CT. Uncovertebral spurring with patent foramina  C3-4: Central disc protrusion, endplate ridging, and ligamentum flavum thickening with 4 mm AP canal diameter in the midline. High-grade left foraminal narrowing.  C4-5: Ventral bridging spondylosis.  No visible impingement  C5-6: Ventral bridging spondylosis.  No visible impingement  C6-7: Ventral bridging spondylosis.  No impingement  C7-T1:Ventral bridging spondylosis.  T1-2: Degenerative facet spurring asymmetric to the right, with foraminal narrowing.  IMPRESSION: 1. C2-3 and C3-4 high-grade spinal stenosis with cord flattening. 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 2. C3-4 left foraminal impingement. 3. C4-T1 ankylosis from ventral flowing osteophytes. 4. 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  Electronically Signed   By: Monte Fantasia M.D.   On: 09/17/2019 07:18 Note: Reviewed        Physical Exam  General appearance: Well nourished, well developed, and well hydrated. In no apparent acute distress Mental status: Alert, oriented x 3 (person, place, & time)       Respiratory: No evidence of acute respiratory distress Eyes: PERLA Vitals: BP 123/68   Pulse (!) 57   Temp (!) 97.2 F (36.2 C) (Temporal)   Resp 16   Ht 6' 3" (1.905 m)   Wt (!) 327 lb (148.3 kg)   SpO2 100%   BMI 40.87 kg/m  BMI: Estimated body mass index is 40.87 kg/m as calculated from the following:   Height as of this encounter: 6' 3" (1.905 m).   Weight as of this encounter: 327 lb (148.3 kg). Ideal: Ideal body weight: 84.5 kg (186 lb 4.6 oz) Adjusted ideal body weight: 110 kg (242 lb 9.2 oz)  Cervical neck pain, slight increase with cervical extension. Bilateral hip pain.   Assessment   Diagnosis  1. Spinal stenosis in cervical region   2. Cervical facet joint syndrome   3. Cervical spondylosis   4. Chronic pain syndrome      Updated Problems: Problem  Spinal Stenosis in Cervical Region  Cervical Spondylosis    Plan of Care  [name] has a current medication list which includes the following long-term medication(s): allopurinol, amlodipine, atorvastatin, carvedilol, gabapentin, hydrochlorothiazide, and losartan. Follow-up as needed for gabapentin refill, diagnostic cervical facet medial branch nerve blocks. Orders Placed This Encounter  Procedures   CERVICAL FACET (MEDIAL BRANCH NERVE BLOCK)     Standing Status:   Standing    Number of Occurrences:   2    Standing Expiration Date:   05/11/2021    Scheduling Instructions:      Side: Bilateral     Level: C3-4, Facet joints (C3, C4, Medial Branch Nerves)     Timeframe: PRN    Order Specific Question:   Where will this procedure be performed?    Answer:   [facility]     Follow-up plan:   Return if symptoms worsen or fail to improve.   Recent Visits No visits were found meeting these conditions. Showing recent visits within past 90 days and meeting all other requirements Today's Visits Date Type Provider Dept  11/09/20 Office Visit Aaron Santa, MD [facility]  Showing today's visits and meeting all other requirements Future Appointments No visits were found meeting these conditions. Showing future appointments within next 90 days and meeting all other requirements I discussed the assessment and treatment plan with the patient. The patient was provided an opportunity to ask questions and all were answered. The patient agreed with the plan and demonstrated an understanding of the instructions.  Patient advised to call back or seek an in-person evaluation if the symptoms or condition worsens.  Duration of encounter:20 minutes.  Note by: Aaron Santa, MD Date: 11/09/2020; Time: 8:56 AM

## 2020-11-10 ENCOUNTER — Ambulatory Visit (INDEPENDENT_AMBULATORY_CARE_PROVIDER_SITE_OTHER): Payer: Medicare HMO | Admitting: Pharmacist

## 2020-11-10 DIAGNOSIS — I152 Hypertension secondary to endocrine disorders: Secondary | ICD-10-CM | POA: Diagnosis not present

## 2020-11-10 DIAGNOSIS — E1159 Type 2 diabetes mellitus with other circulatory complications: Secondary | ICD-10-CM

## 2020-11-10 NOTE — Progress Notes (Signed)
Chronic Care Management Pharmacy Note  11/10/2020 Name:  Aaron [name] MRN:  [id number] DOB:  09/18/1950  Subjective: Aaron [name] is an 70 y.o. year old male who is a primary patient of Aaron, Barbaraann Faster, NP.  The CCM team was consulted for assistance with disease management and care coordination needs.    Engaged with patient by telephone for follow up visit in response to provider referral for pharmacy case management and/or care coordination services.   Consent to Services:  The patient was given information about Chronic Care Management services, agreed to services, and gave verbal consent prior to initiation of services.  Please see initial visit note for detailed documentation.   Patient Care Team: Venita Lick, NP as PCP - General (Nurse Practitioner) Judi Cong, MD as Consulting Physician (Endocrinology) Vladimir [name], [facility] as Pharmacist (Pharmacist)  Recent office visits: 11/04/20-Aaron(PCP)-blood work, Shigrix 08/04/20-Aaron (PCP)- blood work, no med Careers adviser  Recent consult visits: 10/21/20-Gaines, PA(ortho) initial consult right hip pain s/p THR rt 2019, lt 2016. Suspected trochanter bursitis. Xylocaine, marcaine and Kenalog injection given 08/25/20-[facility] (Endocrinology)- insulin pump 24 hr basal 90.1 units Novolog his 2 week blood glucose average was 151 mg/dl with a blood glucose range of 64 - 163 mg/dl. Sugars have been in target (70 - 170 mg/dl) 79% of the time, above target 20% of the time and below target 0% of the time. His Tandem pump has been in Control IQ mode 97% of the time. Pump has delivered on avg 142 units of insulin daily, 53% basal and 47% bolus. He enters on avg 178 g carbs into pump daily. He has to fill the reservoir q1.8 days. Pattern shows highest sugars are from 11pm - 4am and post lunch from 2-4pm. No hypoglycemia. Current Hb A1c is 7.6%.  Converted to U200 in pump 08/31/20-Nice (optry) DM eye exam 07/16/20-Stoiff(Urology)-UA, h/o  hematuria (resloved) radiation cystitis 07/15/20- Lateef ( pain mgmnt)- methocarbamol 750 mg q12h prn, gabapentin 600-900 qhs, neck pain 3/10 06/21/20- [facility] (endocrinology)- insulin pump, 24 hour basal  88 units humalog, avg daily use 130 u daily,  Dexcom 2 week avg 141 mg/dL, range 49-223, in range(70-170) 86% , adjusted overnight rate, changing to Humalog U200- PAP applied Hospital visits: None in previous 6 months  Objective:  Lab Results  Component Value Date   CREATININE 1.53 (H) 11/04/2020   BUN 25 11/04/2020   GFRNONAA 64 05/04/2020   GFRAA 74 05/04/2020   NA 143 11/04/2020   K 4.6 11/04/2020   CALCIUM 9.4 11/04/2020   CO2 27 11/04/2020   GLUCOSE 124 (H) 11/04/2020    Lab Results  Component Value Date/Time   HGBA1C 6.3 10/31/2019 08:53 AM   HGBA1C 6.9 08/19/2019 12:00 AM   HGBA1C 7.6 08/15/2017 12:00 AM   MICROALBUR 30 (H) 08/04/2020 09:12 AM   MICROALBUR 80 (H) 02/03/2020 10:16 AM    Last diabetic Eye exam:  Lab Results  Component Value Date/Time   HMDIABEYEEXA No Retinopathy 08/31/2020 12:00 AM    Last diabetic Foot exam: No results found for: HMDIABFOOTEX   Lab Results  Component Value Date   CHOL 97 (L) 11/04/2020   HDL 40 11/04/2020   LDLCALC 44 11/04/2020   TRIG 55 11/04/2020   CHOLHDL 3.1 08/15/2017    Hepatic Function Latest Ref Rng & Units 11/04/2020 08/04/2020 10/31/2019  Total Protein 6.0 - 8.5 g/dL 7.1 7.2 6.8  Albumin 3.8 - 4.8 g/dL 4.4 4.2 4.2  AST 0 - 40 IU/L 17  19 12  ALT 0 - 44 IU/L _0 Alk Phosphatase 44 - 121 IU/L 169(H) 145(H) 145(H)  Total Bilirubin 0.0 - 1.2 mg/dL 0.3 0.5 0.4    Lab Results  Component Value Date/Time   TSH 3.670 11/04/2020 09:07 AM   TSH 2.610 08/04/2020 09:15 AM    CBC Latest Ref Rng & Units 09/08/2019 11/30/2017 11/29/2017  WBC 3.4 - 10.8 x10E3/uL 9.5 13.1(H) 15.0(H)  Hemoglobin 13.0 - 17.7 g/dL 13.5 10.2(L) 11.2(L)  Hematocrit 37.5 - 51.0 % 40.8 30.3(L) 33.4(L)  Platelets 150 - 450 x10E3/uL 285 185 199     No results found for: VD25OH  Clinical ASCVD: Yes  The ASCVD Risk score Mikey Bussing DC Jr., et al., 2013) failed to calculate for the following reasons:   The valid total cholesterol range is 130 to 320 mg/dL    Depression screen [facility] 2/9 11/04/2020 10/31/2019 10/15/2019  Decreased Interest 0 0 0  Down, Depressed, Hopeless 0 0 0  PHQ - 2 Score 0 0 0  Altered sleeping - 0 -  Tired, decreased energy - 0 -  Change in appetite - 0 -  Feeling bad or failure about yourself  - 0 -  Trouble concentrating - 0 -  Moving slowly or fidgety/restless - 0 -  Suicidal thoughts - 0 -  PHQ-9 Score - 0 -    Spinal stenosis, getting a lot of headaches   Social History   Tobacco Use  Smoking Status Never  Smokeless Tobacco Never   BP Readings from Last 3 Encounters:  11/09/20 123/68  11/04/20 128/72  08/04/20 115/68   Pulse Readings from Last 3 Encounters:  11/09/20 (!) 57  11/04/20 64  08/04/20 65   Wt Readings from Last 3 Encounters:  11/09/20 (!) 327 lb (148.3 kg)  11/04/20 (!) 327 lb 6.4 oz (148.5 kg)  08/04/20 (!) 327 lb 9.6 oz (148.6 kg)   BMI Readings from Last 3 Encounters:  11/09/20 40.87 kg/m  11/04/20 43.20 kg/m  08/04/20 40.95 kg/m    Assessment/Interventions: Review of patient past medical history, allergies, medications, health status, including review of consultants reports, laboratory and other test data, was performed as part of comprehensive evaluation and provision of chronic care management services.   SDOH:  (Social Determinants of Health) assessments and interventions performed: No  SDOH Screenings   Alcohol Screen: Not on file  Depression (PHQ2-9): Low Risk    PHQ-2 Score: 0  Financial Resource Strain: Medium Risk   Difficulty of Paying Living Expenses: Somewhat hard  Food Insecurity: Not on file  Housing: Not on file  Physical Activity: Not on file  Social Connections: Not on file  Stress: Not on file  Tobacco Use: Low Risk    Smoking Tobacco Use:  Never   Smokeless Tobacco Use: Never  Transportation Needs: Not on file     Immunization History  Administered Date(s) Administered   Fluad Quad(high Dose 65+) 03/28/2019, 02/03/2020   Influenza, High Dose Seasonal PF 03/29/2018   Influenza,inj,Quad PF,6+ Mos 07/11/2016, 02/15/2017   Influenza-Unspecified 03/26/2015   PFIZER(Purple Top)SARS-COV-2 Vaccination 08/05/2019, 08/26/2019, 02/25/2020   Pneumococcal Conjugate-13 03/21/2018   Pneumococcal Polysaccharide-23 07/02/2019   Pneumococcal-Unspecified 10/20/1992, 02/20/1999   Tdap 05/31/2011   Zoster, Live 07/11/2016    Conditions to be addressed/monitored:  Hypertension, Hyperlipidemia, Diabetes, Coronary Artery Disease, Gout and morbid obesity, spinal stenosis and history of prostate cancer  Care Plan : [place]  Updates made by Vladimir [name], [place] since 11/10/2020 12:00  AM     Problem: CAD, DM2, HTN, HLD, Chronic pain 2/2 cervical spinal stenosis, gout, morbid obesity, OSA, h/o prostate cancer   Priority: High     Long-Range Goal: Disease Management   Start Date: 08/23/2020  This Visit's Progress: On track  Recent Progress: On track  Priority: High  Note:   Current Barriers:  Unable to independently afford treatment regimen Unable to achieve control of chronic neck pain  Unable to maintain control of diabetes and hypertension  Pharmacist Clinical Goal(s):  Patient will verbalize ability to afford treatment regimen achieve control of chronic pain as evidenced by patient report maintain control of diabetes and BP as evidenced by lab values  contact provider office for questions/concerns as evidenced notation of same in electronic health record through collaboration with PharmD and provider.    Interventions: 1:1 collaboration with Venita Lick, NP regarding development and update of comprehensive plan of care as evidenced by provider attestation and co-signature Inter-disciplinary care team  collaboration (see longitudinal plan of care) Comprehensive medication review performed; medication list updated in electronic medical record  BP Readings from Last 3 Encounters:  11/09/20 123/68  11/04/20 128/72  08/04/20 115/68   Lab Results  Component Value Date   CREATININE 1.53 (H) 11/04/2020   Hypertension /CKD IIIa(BP goal <130/80) -Controlled -Current treatment: Losartan 100 mg qd HCTZ 25 mg qd Carvedilol 25 mg bid Amlodipine 10 mg qd -Medications previously tried: na  -Current home readings: 124/63 at  pain mgmtappt yesterday. Usually 130s/70s at home -Denies hypotensive/hypertensive symptoms -Educated on BP goals and benefits of medications for prevention of heart attack, stroke and kidney damage; Daily salt intake goal < 2300 mg; Importance of home blood pressure monitoring; Symptoms of hypotension and importance of maintaining adequate hydration; -Counseled to monitor BP at home daily, document, and provide log at future appointments -Recommended to continue current medication Recommended patient compare home BP readings with in office readings. Bring BP monitor to appointment with PCP for calibration   A1c 7.6 08/18/20 at Endo  Diabetes CKDII a(A1c goal <7%) -Not ideally controlled -Current medications: Novolog ~ 142 units daily via insulin pump -- Converted pump to Humalog U-200.  -Medications previously tried: NA  -Current home glucose readings New pump settings: Basal rates Correction factor Carb ratio Target BG 12 am 2.45 units/hr 20 8 110 4 am 2.0 units/hr 20 8 110 8:30 am 1.2 units/hr 20 8 110 11 am 1.8 units.hr 20 6 110 8 pm 2.2 units/hr 20 6 110  24-hr basal = 46.8 units Humalog U200 insulin (equals 93.6 units of U100 insulin) Uses  Dexcom CGM  -Denies hypoglycemic/hyperglycemic symptoms -Educated on A1c and blood sugar goals; Prevention and management of hypoglycemic episodes; Benefits of routine self-monitoring of blood sugar; -Counseled to  check feet daily and get yearly eye exams -Recommended to continue current medication and regular follow up with endo -Recommend referral to nephrology  Chronic pain (neck) new onset hip pain(Goal: alleviation of symptoms and improved quality of life) -Not ideally controlled -Current treatment  Gabapentin 600 mg qhs Methocarbamol 750 mg qhs Kenalog , Marcaine, Xylocaine injection x 1 10/23/20 -Medications previously tried: tizanidine  -Recommended to continue current medication Counseled on potential sedating effects of methocarbamol. Effects of increased pain on BP Educated on effects of steroid injection on BG and recommend patient check BG more frequently after steroid injections.   Patient Goals/Self-Care Activities Patient will:  - take medications as prescribed check blood pressure daily, document, and provide at future appointments  target a minimum of 150 minutes of moderate intensity exercise weekly  Follow Up Plan: Telephone follow up appointment with care management team member scheduled for: 1 month CPA, 2 months PharmD.           Medication Assistance:  U -200 Humalog through [facility] patient assistance coordinated by endo.  Patient's preferred pharmacy is:  [id number] [address], [place] [address] Phone: [phone] Fax: [phone]  [id number] [place] [place] [address] [id number]: [phone]: [facility] ([facility]) - [address] [place] [place]Phone: [phone]: [phone]  We discussed: Current pharmacy is preferred with insurance plan and patient is satisfied with pharmacy services Patient decided to: Continue current medication management strategy  Care Plan and Follow Up  Patient Decision:  Patient agrees to Care Plan and Follow-up.  Plan: Telephone follow up appointment with care management team member scheduled for:  CPA one month, PharmD 2 months  JJunita Push HKenton KingfisherPharmD, [facility] [phone]

## 2020-11-10 NOTE — Patient Instructions (Signed)
Visit Information  It was a pleasure speaking with you today. Thank you for letting me be part of your clinical team. Please call with any questions or concerns.    Goals Addressed             This Visit's Progress    Manage Chronic Pain   On track    Timeframe:  Short-Term Goal Priority:  High Start Date:                             Expected End Date:                       Follow Up Date 2 month follow up appt   - call for medicine refill 2 or 3 days before it runs out - develop a personal pain management plan - keep track of prescription refills - use ice or heat for pain relief    Why is this important?   Day-to-day life can be hard when you have chronic pain.  Pain medicine is just one piece of the treatment puzzle.  You can try these action steps to help you manage your pain.    Notes:       Monitor and Manage My Blood Sugar-Diabetes Type 2   On track    Timeframe:  Long-Range Goal Priority:  High Start Date:                             Expected End Date:                       Follow Up Date  2  month assessment call   - check blood sugar at prescribed times - check blood sugar if I feel it is too high or too low - take the blood sugar meter to all doctor visits    Why is this important?   Checking your blood sugar at home helps to keep it from getting very high or very low.  Writing the results in a diary or log helps the doctor know how to care for you.  Your blood sugar log should have the time, date and the results.  Also, write down the amount of insulin or other medicine that you take.  Other information, like what you ate, exercise done and how you were feeling, will also be helpful.     Notes:       Perform Foot Care-Diabetes Type 2   On track    Timeframe:  Long-Range Goal Priority:  High Start Date:                             Expected End Date:                       Follow Up Date  3 month assessment call   - check feet daily for cuts,  sores or redness - keep feet up while sitting - wear comfortable, well-fitting shoes    Why is this important?   Good foot care is very important when you have diabetes.  There are many things you can do to keep your feet healthy and catch a problem early.    Notes:       COMPLETED: Pharmacy Care Plan  CARE PLAN ENTRY (see longitudinal plan of care for additional care plan information)  Current Barriers:  Chronic Disease Management support, education, and care coordination needs related to Hypertension, Hyperlipidemia, Diabetes, Gout, and History of  prostate cancer   Hypertension BP Readings from Last 3 Encounters:  05/04/20 128/82  02/03/20 118/73  01/14/20 (!) 143/78  Pharmacist Clinical Goal(s): Over the next 60 days, patient will work with PharmD and providers to achieve BP goal <130/80 Current regimen:  Losartan 100 mg qd HCTZ 25 mg qd Amlodipine 10 mg qd Interventions: Reviewed home BP readings Provided diet and exercise counseling. Reviewed fill history Patient self care activities - Over the next 60 days, patient will: Check BP daily, document, and provide at future appointments Ensure daily salt intake < 2300 mg/day  Hyperlipidemia Lab Results  Component Value Date/Time   LDLCALC 65 05/04/2020 09:36 AM  Pharmacist Clinical Goal(s): Over the next 60 days, patient will work with PharmD and providers to maintain LDL goal < 70 Current regimen:  Atorvastatin 20 mg qd Aspirin 81 mg qd Interventions: Provided diet and exercise counseling. Patient self care activities - Over the next 60 days, patient will: Increase exercise  Focus on DASH diet  Diabetes Lab Results  Component Value Date/Time   HGBA1C 6.3 10/31/2019 08:53 AM   HGBA1C 6.9 08/19/2019 12:00 AM   HGBA1C 7.6 08/15/2017 12:00 AM  Pharmacist Clinical Goal(s): Over the next 60 days, patient will work with PharmD and providers to maintain A1c goal <7% Current regimen:  Novolog via insulin  pump Interventions: Reviewed readings and notes from endo PAP form faxed to endo for sig and faxed to [place] by endo 12/30 Discussed upcoming transition to new insulin pump Patient self care activities - Over the next 60 days, patient will: Check blood sugar 3-4 times daily, document, and provide at future appointments Contact provider with any episodes of hypoglycemia  *Medication management Pharmacist Clinical Goal(s): Over the next 60 days, patient will work with PharmD and providers to maintain optimal medication adherence Current pharmacy: [facility] mail order Interventions Comprehensive medication review performed. Continue current medication management strategy Patient self care activities - Over the next 60 days, patient will: Focus on medication adherence by fill dates Take medications as prescribed Report any questions or concerns to PharmD and/or provider(s)  Initial goal documentation          Patient verbalizes understanding of instructions provided today and agrees to view in [place].   Telephone follow up appointment with pharmacy team member scheduled for: 1 month CPA, 3 months PharmdD  Junita Push. Kenton Kingfisher PharmD, [place] Clinical Pharmacist [phone]

## 2020-11-17 ENCOUNTER — Telehealth: Payer: Self-pay | Admitting: Pharmacist

## 2020-11-17 NOTE — Chronic Care Management (AMB) (Signed)
    Chronic Care Management Pharmacy Assistant   Name: Aaron [name]  MRN: [id number] DOB: 10-19-1950   Reason for Encounter:  Chart Review      Medications: Outpatient Encounter Medications as of 11/17/2020  Medication Sig   ACCU-CHEK GUIDE test strip    allopurinol (ZYLOPRIM) 300 MG tablet TAKE 1 TABLET EVERY DAY   amLODipine (NORVASC) 10 MG tablet TAKE 1 TABLET EVERY DAY   ASPIRIN 81 PO every evening.    atorvastatin (LIPITOR) 20 MG tablet TAKE 1 TABLET EVERY DAY   carvedilol (COREG) 25 MG tablet Take 1 tablet (25 mg total) by mouth 2 (two) times daily with a meal.   gabapentin (NEURONTIN) 600 MG tablet Take 1-1.5 tablets (600-900 mg total) by mouth at bedtime.   hydrochlorothiazide (HYDRODIURIL) 25 MG tablet TAKE 1 TABLET EVERY DAY   insulin aspart (NOVOLOG) 100 UNIT/ML injection Inject into the skin. Using via insulin pump  Medtronic MiniMed 630G insulin pump   Insulin Human (INSULIN PUMP) SOLN Inject into the skin.   losartan (COZAAR) 100 MG tablet TAKE 1 TABLET EVERY DAY   tamsulosin (FLOMAX) 0.4 MG CAPS capsule Take 1 capsule (0.4 mg total) by mouth daily.   [START ON 05/04/2021] Zoster Vaccine Adjuvanted [facility]) injection Inject 0.5 mLs into the muscle once for 1 dose. Dose # 2 (Patient not taking: Reported on 11/09/2020)   No facility-administered encounter medications on file as of 11/17/2020.    Reviewed chart for medication changes and adherence.  No OVs, Consults, or hospital visits since last care coordination call / Pharmacist visit. No medication changes indicated  No gaps in adherence identified. Patient has follow up scheduled with pharmacy team. No further action required.   Lizbeth Bark Clinical Pharmacist Assistant [phone]

## 2020-11-19 ENCOUNTER — Other Ambulatory Visit: Payer: Self-pay | Admitting: Nurse Practitioner

## 2020-11-19 DIAGNOSIS — E1159 Type 2 diabetes mellitus with other circulatory complications: Secondary | ICD-10-CM

## 2020-11-19 NOTE — Telephone Encounter (Signed)
Pt is scheduled 9/16

## 2020-11-19 NOTE — Telephone Encounter (Signed)
   Notes to clinic:  script requested has expired  Review for continued use and refill    Requested Prescriptions  Pending Prescriptions Disp Refills   carvedilol (COREG) 25 MG tablet [Pharmacy Med Name: CARVEDILOL 25 MG Tablet] 180 tablet 4    Sig: TAKE 1 TABLET TWICE DAILY WITH A MEAL      Cardiovascular:  Beta Blockers Passed - 11/19/2020 11:44 AM      Passed - Last BP in normal range    BP Readings from Last 1 Encounters:  11/09/20 123/68          Passed - Last Heart Rate in normal range    Pulse Readings from Last 1 Encounters:  11/09/20 (!) 3          Passed - Valid encounter within last 6 months    Recent Outpatient Visits           2 weeks ago Type 2 diabetes mellitus with proteinuria ([place])   [place], [place] T, NP   3 months ago Type 2 diabetes mellitus with proteinuria ([place])   [place], Jolene T, NP   6 months ago Diabetes mellitus associated with hormonal etiology ([place])   [place] Cannady, Jolene T, NP   9 months ago Diabetes mellitus associated with hormonal etiology ([place])   E. Lopez, [place], DO   1 year ago Encounter for annual physical exam   [place], Barbaraann Faster, NP       Future Appointments             In 2 months Cannady, Barbaraann Faster, NP [employer], [place]   In 8 months Stoioff, Ronda Fairly, MD [facility]

## 2020-11-20 DIAGNOSIS — E1165 Type 2 diabetes mellitus with hyperglycemia: Secondary | ICD-10-CM | POA: Diagnosis not present

## 2020-12-06 DIAGNOSIS — E669 Obesity, unspecified: Secondary | ICD-10-CM | POA: Diagnosis not present

## 2020-12-06 DIAGNOSIS — E1169 Type 2 diabetes mellitus with other specified complication: Secondary | ICD-10-CM | POA: Diagnosis not present

## 2020-12-06 DIAGNOSIS — Z794 Long term (current) use of insulin: Secondary | ICD-10-CM | POA: Diagnosis not present

## 2020-12-06 DIAGNOSIS — E113293 Type 2 diabetes mellitus with mild nonproliferative diabetic retinopathy without macular edema, bilateral: Secondary | ICD-10-CM | POA: Diagnosis not present

## 2020-12-06 DIAGNOSIS — Z9641 Presence of insulin pump (external) (internal): Secondary | ICD-10-CM | POA: Diagnosis not present

## 2020-12-21 DIAGNOSIS — E1165 Type 2 diabetes mellitus with hyperglycemia: Secondary | ICD-10-CM | POA: Diagnosis not present

## 2020-12-23 DIAGNOSIS — E1165 Type 2 diabetes mellitus with hyperglycemia: Secondary | ICD-10-CM | POA: Diagnosis not present

## 2021-01-08 ENCOUNTER — Other Ambulatory Visit: Payer: Self-pay | Admitting: Nurse Practitioner

## 2021-01-08 DIAGNOSIS — E1159 Type 2 diabetes mellitus with other circulatory complications: Secondary | ICD-10-CM

## 2021-01-08 DIAGNOSIS — I152 Hypertension secondary to endocrine disorders: Secondary | ICD-10-CM

## 2021-01-08 NOTE — Telephone Encounter (Signed)
Requested Prescriptions  Pending Prescriptions Disp Refills  . losartan (COZAAR) 100 MG tablet [Pharmacy Med Name: LOSARTAN POTASSIUM 100 MG Tablet] 90 tablet 1    Sig: TAKE 1 TABLET EVERY DAY     Cardiovascular:  Angiotensin Receptor Blockers Failed - 01/08/2021  3:39 PM      Failed - Cr in normal range and within 180 days    Creatinine  Date Value Ref Range Status  10/26/2012 1.49 (H) 0.60 - 1.30 mg/dL Final   Creatinine, Ser  Date Value Ref Range Status  11/04/2020 1.53 (H) 0.76 - 1.27 mg/dL Final         Passed - K in normal range and within 180 days    Potassium  Date Value Ref Range Status  11/04/2020 4.6 3.5 - 5.2 mmol/L Final  10/26/2012 3.9 3.5 - 5.1 mmol/L Final         Passed - Patient is not pregnant      Passed - Last BP in normal range    BP Readings from Last 1 Encounters:  11/09/20 123/68         Passed - Valid encounter within last 6 months    Recent Outpatient Visits          2 months ago Type 2 diabetes mellitus with proteinuria ([place])   [place], Jolene T, NP   5 months ago Type 2 diabetes mellitus with proteinuria ([place])   [place], Jolene T, NP   8 months ago Diabetes mellitus associated with hormonal etiology ([place])   [place], Jolene T, NP   11 months ago Diabetes mellitus associated with hormonal etiology ([place])   [place], Megan P, DO   1 year ago Encounter for annual physical exam   [place], Barbaraann Faster, NP      Future Appointments            In 3 weeks Cannady, Barbaraann Faster, NP [employer], [place]   In 6 months Stoioff, Ronda Fairly, MD [place]

## 2021-01-21 DIAGNOSIS — E1165 Type 2 diabetes mellitus with hyperglycemia: Secondary | ICD-10-CM | POA: Diagnosis not present

## 2021-01-27 DIAGNOSIS — G4733 Obstructive sleep apnea (adult) (pediatric): Secondary | ICD-10-CM | POA: Diagnosis not present

## 2021-02-04 ENCOUNTER — Encounter: Payer: Self-pay | Admitting: Nurse Practitioner

## 2021-02-04 ENCOUNTER — Other Ambulatory Visit: Payer: Self-pay

## 2021-02-04 ENCOUNTER — Ambulatory Visit (INDEPENDENT_AMBULATORY_CARE_PROVIDER_SITE_OTHER): Payer: Medicare HMO | Admitting: Nurse Practitioner

## 2021-02-04 VITALS — BP 120/73 | HR 62 | Temp 98.8°F | Wt 329.2 lb

## 2021-02-04 DIAGNOSIS — E1159 Type 2 diabetes mellitus with other circulatory complications: Secondary | ICD-10-CM

## 2021-02-04 DIAGNOSIS — E1169 Type 2 diabetes mellitus with other specified complication: Secondary | ICD-10-CM | POA: Diagnosis not present

## 2021-02-04 DIAGNOSIS — Z794 Long term (current) use of insulin: Secondary | ICD-10-CM

## 2021-02-04 DIAGNOSIS — G894 Chronic pain syndrome: Secondary | ICD-10-CM

## 2021-02-04 DIAGNOSIS — E1129 Type 2 diabetes mellitus with other diabetic kidney complication: Secondary | ICD-10-CM

## 2021-02-04 DIAGNOSIS — E113293 Type 2 diabetes mellitus with mild nonproliferative diabetic retinopathy without macular edema, bilateral: Secondary | ICD-10-CM | POA: Diagnosis not present

## 2021-02-04 DIAGNOSIS — I700 Atherosclerosis of aorta: Secondary | ICD-10-CM

## 2021-02-04 DIAGNOSIS — Z6841 Body Mass Index (BMI) 40.0 and over, adult: Secondary | ICD-10-CM | POA: Diagnosis not present

## 2021-02-04 DIAGNOSIS — Z23 Encounter for immunization: Secondary | ICD-10-CM

## 2021-02-04 DIAGNOSIS — I152 Hypertension secondary to endocrine disorders: Secondary | ICD-10-CM | POA: Diagnosis not present

## 2021-02-04 DIAGNOSIS — E785 Hyperlipidemia, unspecified: Secondary | ICD-10-CM

## 2021-02-04 DIAGNOSIS — Z8546 Personal history of malignant neoplasm of prostate: Secondary | ICD-10-CM | POA: Diagnosis not present

## 2021-02-04 DIAGNOSIS — E1122 Type 2 diabetes mellitus with diabetic chronic kidney disease: Secondary | ICD-10-CM

## 2021-02-04 DIAGNOSIS — M1A0720 Idiopathic chronic gout, left ankle and foot, without tophus (tophi): Secondary | ICD-10-CM

## 2021-02-04 DIAGNOSIS — Z9641 Presence of insulin pump (external) (internal): Secondary | ICD-10-CM

## 2021-02-04 DIAGNOSIS — G4733 Obstructive sleep apnea (adult) (pediatric): Secondary | ICD-10-CM

## 2021-02-04 DIAGNOSIS — N183 Chronic kidney disease, stage 3 unspecified: Secondary | ICD-10-CM

## 2021-02-04 DIAGNOSIS — R809 Proteinuria, unspecified: Secondary | ICD-10-CM

## 2021-02-04 LAB — BAYER DCA HB A1C WAIVED: HB A1C (BAYER DCA - WAIVED): 6.8 % — ABNORMAL HIGH (ref 4.8–5.6)

## 2021-02-04 MED ORDER — SHINGRIX 50 MCG/0.5ML IM SUSR: 0.5000 mL | Freq: Once | INTRAMUSCULAR | 0 refills | Status: AC

## 2021-02-04 MED ORDER — SHINGRIX 50 MCG/0.5ML IM SUSR
0.5000 mL | Freq: Once | INTRAMUSCULAR | 0 refills | Status: AC
Start: 2021-07-06 — End: 2021-07-06

## 2021-02-04 NOTE — Assessment & Plan Note (Signed)
Continue to collaborate with endocrinology. 

## 2021-02-04 NOTE — Assessment & Plan Note (Signed)
Noted on 09/16/19 imaging.  Continue daily statin and ASA for prevention + focus on healthy diet and modest weight loss.

## 2021-02-04 NOTE — Assessment & Plan Note (Signed)
Recommend focus on modest weight loss with diet changes and regular exercise regimen.   

## 2021-02-04 NOTE — Assessment & Plan Note (Signed)
Continues to use CPAP faithfully every night and denies any issues with this.

## 2021-02-04 NOTE — Assessment & Plan Note (Signed)
Chronic, ongoing, followed by endocrinology with insulin pump in place + followed by Dr. Matilde Sprang for eye exams.  Continue current collaboration and pump settings per endo.  A1C 6.8% today.  Recommend focus on diet and modest weight loss.  Does endorse poor diet.  Recommend he continue to monitor BS closely at home and if any consistent <70 immediately alert endo for pump changes.  Return in 3 months.

## 2021-02-04 NOTE — Assessment & Plan Note (Signed)
Continue to collaborate with urology as needed.  PSA up to date.

## 2021-02-04 NOTE — Assessment & Plan Note (Addendum)
Chronic, stable.  Continue current medication regimen and adjust as needed.  Uric acid up to date.  May need to discontinue HCTZ in future if return of flares presents.

## 2021-02-04 NOTE — Assessment & Plan Note (Signed)
Noted on recent labs with eGFR 49.  Will recheck today and may adjust HCTZ if ongoing low levels.  Recommend increase hydration at home.  A1c today 6.8%.  Continue Losartan for kidney protection.

## 2021-02-04 NOTE — Assessment & Plan Note (Signed)
Chronic, ongoing, followed by endocrinology with insulin pump in place.  Continue current collaboration and pump settings per endo -- recent note reviewed.  A1C done today per patient request = 6.8% -- urine ALB 30 in March 2022, continue Losartan for kidney protection.  Recommend focus on diet and modest weight loss.  Does endorse poor diet on occasion.  Recommend he continue to monitor BS closely at home and if any consistent <70 immediately alert endo for pump changes.  Return in 3 months.

## 2021-02-04 NOTE — Patient Instructions (Signed)
Diabetes Mellitus and Nutrition, Adult When you have diabetes, or diabetes mellitus, it is very important to have healthy eating habits because your blood sugar (glucose) levels are greatly affected by what you eat and drink. Eating healthy foods in the right amounts, at about the same times every day, can help you:  Control your blood glucose.  Lower your risk of heart disease.  Improve your blood pressure.  Reach or maintain a healthy weight. What can affect my meal plan? Every person with diabetes is different, and each person has different needs for a meal plan. Your health care provider may recommend that you work with a dietitian to make a meal plan that is best for you. Your meal plan may vary depending on factors such as:  The calories you need.  The medicines you take.  Your weight.  Your blood glucose, blood pressure, and cholesterol levels.  Your activity level.  Other health conditions you have, such as heart or kidney disease. How do carbohydrates affect me? Carbohydrates, also called carbs, affect your blood glucose level more than any other type of food. Eating carbs naturally raises the amount of glucose in your blood. Carb counting is a method for keeping track of how many carbs you eat. Counting carbs is important to keep your blood glucose at a healthy level, especially if you use insulin or take certain oral diabetes medicines. It is important to know how many carbs you can safely have in each meal. This is different for every person. Your dietitian can help you calculate how many carbs you should have at each meal and for each snack. How does alcohol affect me? Alcohol can cause a sudden decrease in blood glucose (hypoglycemia), especially if you use insulin or take certain oral diabetes medicines. Hypoglycemia can be a life-threatening condition. Symptoms of hypoglycemia, such as sleepiness, dizziness, and confusion, are similar to symptoms of having too much  alcohol.  Do not drink alcohol if: ? Your health care provider tells you not to drink. ? You are pregnant, may be pregnant, or are planning to become pregnant.  If you drink alcohol: ? Do not drink on an empty stomach. ? Limit how much you use to:  0-1 drink a day for women.  0-2 drinks a day for men. ? Be aware of how much alcohol is in your drink. In the [place] one drink equals one 12 oz bottle of beer (355 mL), one 5 oz glass of wine (148 mL), or one 1 oz glass of hard liquor (44 mL). ? Keep yourself hydrated with water, diet soda, or unsweetened iced tea.  Keep in mind that regular soda, juice, and other mixers may contain a lot of sugar and must be counted as carbs. What are tips for following this plan? Reading food labels  Start by checking the serving size on the "Nutrition Facts" label of packaged foods and drinks. The amount of calories, carbs, fats, and other nutrients listed on the label is based on one serving of the item. Many items contain more than one serving per package.  Check the total grams (g) of carbs in one serving. You can calculate the number of servings of carbs in one serving by dividing the total carbs by 15. For example, if a food has 30 g of total carbs per serving, it would be equal to 2 servings of carbs.  Check the number of grams (g) of saturated fats and trans fats in one serving. Choose foods that have   a low amount or none of these fats.  Check the number of milligrams (mg) of salt (sodium) in one serving. Most people should limit total sodium intake to less than 2,300 mg per day.  Always check the nutrition information of foods labeled as "low-fat" or "nonfat." These foods may be higher in added sugar or refined carbs and should be avoided.  Talk to your dietitian to identify your daily goals for nutrients listed on the label. Shopping  Avoid buying canned, pre-made, or processed foods. These foods tend to be high in fat, sodium, and added  sugar.  Shop around the outside edge of the grocery store. This is where you will most often find fresh fruits and vegetables, bulk grains, fresh meats, and fresh dairy. Cooking  Use low-heat cooking methods, such as baking, instead of high-heat cooking methods like deep frying.  Cook using healthy oils, such as olive, canola, or sunflower oil.  Avoid cooking with butter, cream, or high-fat meats. Meal planning  Eat meals and snacks regularly, preferably at the same times every day. Avoid going long periods of time without eating.  Eat foods that are high in fiber, such as fresh fruits, vegetables, beans, and whole grains. Talk with your dietitian about how many servings of carbs you can eat at each meal.  Eat 4-6 oz (112-168 g) of lean protein each day, such as lean meat, chicken, fish, eggs, or tofu. One ounce (oz) of lean protein is equal to: ? 1 oz (28 g) of meat, chicken, or fish. ? 1 egg. ?  cup (62 g) of tofu.  Eat some foods each day that contain healthy fats, such as avocado, nuts, seeds, and fish.   What foods should I eat? Fruits Berries. Apples. Oranges. Peaches. Apricots. Plums. Grapes. Mango. Papaya. Pomegranate. Kiwi. Cherries. Vegetables Lettuce. Spinach. Leafy greens, including kale, chard, collard greens, and mustard greens. Beets. Cauliflower. Cabbage. Broccoli. Carrots. Green beans. Tomatoes. Peppers. Onions. Cucumbers. Brussels sprouts. Grains Whole grains, such as whole-wheat or whole-grain bread, crackers, tortillas, cereal, and pasta. Unsweetened oatmeal. Quinoa. Brown or wild rice. Meats and other proteins Seafood. Poultry without skin. Lean cuts of poultry and beef. Tofu. Nuts. Seeds. Dairy Low-fat or fat-free dairy products such as milk, yogurt, and cheese. The items listed above may not be a complete list of foods and beverages you can eat. Contact a dietitian for more information. What foods should I avoid? Fruits Fruits canned with  syrup. Vegetables Canned vegetables. Frozen vegetables with butter or cream sauce. Grains Refined white flour and flour products such as bread, pasta, snack foods, and cereals. Avoid all processed foods. Meats and other proteins Fatty cuts of meat. Poultry with skin. Breaded or fried meats. Processed meat. Avoid saturated fats. Dairy Full-fat yogurt, cheese, or milk. Beverages Sweetened drinks, such as soda or iced tea. The items listed above may not be a complete list of foods and beverages you should avoid. Contact a dietitian for more information. Questions to ask a health care provider  Do I need to meet with a diabetes educator?  Do I need to meet with a dietitian?  What number can I call if I have questions?  When are the best times to check my blood glucose? Where to find more information:  American Diabetes Association: diabetes.org  Academy of Nutrition and Dietetics: www.eatright.org  National Institute of Diabetes and Digestive and Kidney Diseases: www.niddk.nih.gov  Association of Diabetes Care and Education Specialists: www.diabeteseducator.org Summary  It is important to have healthy eating   habits because your blood sugar (glucose) levels are greatly affected by what you eat and drink.  A healthy meal plan will help you control your blood glucose and maintain a healthy lifestyle.  Your health care provider may recommend that you work with a dietitian to make a meal plan that is best for you.  Keep in mind that carbohydrates (carbs) and alcohol have immediate effects on your blood glucose levels. It is important to count carbs and to use alcohol carefully. This information is not intended to replace advice given to you by your health care provider. Make sure you discuss any questions you have with your health care provider. Document Revised: 04/15/2019 Document Reviewed: 04/15/2019 Elsevier Patient Education  2021 Elsevier Inc.  

## 2021-02-04 NOTE — Progress Notes (Signed)
BP 120/73   Pulse 62   Temp 98.8 F (37.1 C) (Oral)   Wt (!) 329 lb 3.2 oz (149.3 kg)   SpO2 97%   BMI 41.15 kg/m    Subjective:    Patient ID: Aaron [name], male    DOB: 1951/03/03, 70 y.o.   MRN: [id number]  HPI: [name] is a 70 y.o. male  Chief Complaint  Patient presents with   Diabetes   Hyperlipidemia   Hypertension    Patient states according to his blood pressure meter at home his bottom number has been low.   Gout   DIABETES Is followed by Dr. Gabriel Carina with last visit 12/06/20 when A1c was 7.1%.    Uses insulin pump, per Dr. Gabriel Carina last note 06/22/19: "Basal rates Correction factor Carb ratio Target BG 12 am 2.45 units/hr 20 8 110 4 am 2.0 units/hr 20 8 110 8:30 am 1.2 units/hr 20 8 110 11 am 1.8 units.hr 20 6 110 8 pm 2.2 units/hr 20 6 110  24-hr basal = 46.8 units Humalog U200 insulin (equals 93.6 units of U100 insulin) "  He is followed by Dr. Matilde Sprang for retinopathy and last seen 09/09/20. Hypoglycemic episodes: a couple episodes Polydipsia/polyuria: no Visual disturbance: no Chest pain: no Paresthesias: no Glucose Monitoring: yes             Accucheck frequency: QID -- on average 120-130              Fasting glucose:              Post prandial:              Evening:              Before meals:  Taking Insulin?: yes             Long acting insulin:             Short acting insulin: Blood Pressure Monitoring: not checking Retinal Examination: Up to Date Foot Exam: Up to Date Pneumovax: Up to Date Influenza: Up to Date Aspirin: yes    HYPERTENSION / HYPERLIPIDEMIA Continues on Losartan 100 MG, HCTZ 25 MG daily, Carvedilol 25 MG BID, Amlodipine 10 MG daily.  Takes Lipitor 20 MG.    Has OSA and uses CPAP every night.  On 09/16/19 imaging aortic atherosclerosis noted. Satisfied with current treatment? yes Duration of hypertension: chronic BP monitoring frequency: a couple times a day BP range: this morning 126/58 BP medication side effects:  no Duration of hyperlipidemia: chronic Cholesterol medication side effects: no Cholesterol supplements: none Medication compliance: good compliance Aspirin: yes Recent stressors: no Recurrent headaches: no Visual changes: no Palpitations: no Dyspnea: no Chest pain: no Lower extremity edema: no Dizzy/lightheaded: no   CHRONIC KIDNEY DISEASE Last labs noted CRT 1.53 and eGFR 49. CKD status: stable Medications renally dose: yes Previous renal evaluation: no Pneumovax:  Up to Date Influenza Vaccine:  Up to Date   CHRONIC NECK PAIN Followed by pain clinic and last saw Dr. Holley Raring on 11/09/20 for chronic pain and spinal stenosis in cervical spine.  Is taking Gabapentin 600 MG QHS.  Patient reports pain management cut him loose as there is nothing they can do for him.  Is to return to see neurosurgery, Dr. Lacinda Axon, in November. Aaron [name] Has history of gout, taking Allopurinol --- has not had flare in years -- left great toe. Treatments attempted: rest, ice, heat, APAP and physical therapy  Compliant with recommended  treatment: yes Relief with NSAIDs?:  No NSAIDs Taken Location:midline Duration:chronic Severity: moderate Quality: dull and aching Frequency: constant Radiation: none Aggravating factors: lifting, movement and bending Alleviating factors: rest, ice, heat, APAP and muscle relaxer Weakness:  no Paresthesias / decreased sensation:  no  Fevers:  no   HISTORY OF PROSTATE CANCER: Prostate was removed in 2013.  Last urology visit was 07/16/20.  Cystoscopy last 12/29/2019.  No changes recent visit and is to follow-up in one year.  Continues on Flomax.   Relevant past medical, surgical, family and social history reviewed and updated as indicated. Interim medical history since our last visit reviewed. Allergies and medications reviewed and updated.  Review of Systems  Constitutional:  Negative for activity change, diaphoresis, fatigue and fever.  Respiratory:  Negative for cough, chest  tightness, shortness of breath and wheezing.   Cardiovascular:  Negative for chest pain, palpitations and leg swelling.  Gastrointestinal: Negative.   Endocrine: Negative for cold intolerance, heat intolerance, polydipsia, polyphagia and polyuria.  Musculoskeletal:  Positive for neck pain.  Neurological: Negative.   Psychiatric/Behavioral: Negative.     Per HPI unless specifically indicated above     Objective:    BP 120/73   Pulse 62   Temp 98.8 F (37.1 C) (Oral)   Wt (!) 329 lb 3.2 oz (149.3 kg)   SpO2 97%   BMI 41.15 kg/m   Wt Readings from Last 3 Encounters:  02/04/21 (!) 329 lb 3.2 oz (149.3 kg)  11/09/20 (!) 327 lb (148.3 kg)  11/04/20 (!) 327 lb 6.4 oz (148.5 kg)    Physical Exam Vitals and nursing note reviewed.  Constitutional:      General: He is awake. He is not in acute distress.    Appearance: Normal appearance. He is well-groomed. He is morbidly obese. He is not ill-appearing or toxic-appearing.  HENT:     Head: Normocephalic and atraumatic.     Right Ear: Hearing and external ear normal. No drainage.     Left Ear: Hearing and external ear normal. No drainage.  Eyes:     General: No scleral icterus.       Right eye: No discharge.        Left eye: No discharge.     Pupils: Pupils are equal, round, and reactive to light.  Cardiovascular:     Rate and Rhythm: Normal rate and regular rhythm.     Pulses: Normal pulses.     Heart sounds: Normal heart sounds. No murmur heard.   No friction rub. No gallop.  Pulmonary:     Effort: Pulmonary effort is normal. No respiratory distress.     Breath sounds: Normal breath sounds. No stridor. No wheezing, rhonchi or rales.  Chest:     Chest wall: No tenderness.  Musculoskeletal:     Cervical back: Normal range of motion and neck supple.  Skin:    General: Skin is warm and dry.     Capillary Refill: Capillary refill takes less than 2 seconds.     Coloration: Skin is not jaundiced or pale.     Findings: No  bruising, erythema, lesion or rash.  Neurological:     General: No focal deficit present.     Mental Status: He is alert and oriented to person, place, and time. Mental status is at baseline.  Psychiatric:        Mood and Affect: Mood normal.        Behavior: Behavior normal. Behavior is cooperative.  Thought Content: Thought content normal.        Judgment: Judgment normal.   Results for orders placed or performed in visit on 11/04/20  Lipid Panel w/o Chol/HDL Ratio  Result Value Ref Range   Cholesterol, Total 97 (L) 100 - 199 mg/dL   Triglycerides 55 0 - 149 mg/dL   HDL 40 >39 mg/dL   VLDL Cholesterol Cal 13 5 - 40 mg/dL   LDL Chol Calc (NIH) 44 0 - 99 mg/dL  TSH  Result Value Ref Range   TSH 3.670 0.450 - 4.500 uIU/mL  Comprehensive metabolic panel  Result Value Ref Range   Glucose 124 (H) 65 - 99 mg/dL   BUN 25 8 - 27 mg/dL   Creatinine, Ser 1.53 (H) 0.76 - 1.27 mg/dL   eGFR 49 (L) >59 mL/min/1.73   BUN/Creatinine Ratio 16 10 - 24   Sodium 143 134 - 144 mmol/L   Potassium 4.6 3.5 - 5.2 mmol/L   Chloride 101 96 - 106 mmol/L   CO2 27 20 - 29 mmol/L   Calcium 9.4 8.6 - 10.2 mg/dL   Total Protein 7.1 6.0 - 8.5 g/dL   Albumin 4.4 3.8 - 4.8 g/dL   Globulin, Total 2.7 1.5 - 4.5 g/dL   Albumin/Globulin Ratio 1.6 1.2 - 2.2   Bilirubin Total 0.3 0.0 - 1.2 mg/dL   Alkaline Phosphatase 169 (H) 44 - 121 IU/L   AST 17 0 - 40 IU/L   ALT 12 0 - 44 IU/L  Uric acid  Result Value Ref Range   Uric Acid 5.1 3.8 - 8.4 mg/dL  PSA  Result Value Ref Range   Prostate Specific Ag, Serum <0.1 0.0 - 4.0 ng/mL      Assessment & Plan:   Problem List Items Addressed This Visit       Cardiovascular and Mediastinum   Hypertension associated with diabetes (HCC)    Chronic, ongoing.  BP at goal today in office.  Recommend he continue to check BP at home twice daily and document for provider visits. Continue current medication regimen at this time, is on max dose multiple medications.   May need to adjust in future and change HCTZ if return of gout flares or decline in kidney function.  CMP today.  Return in 3 months for follow-up.      Relevant Medications   insulin lispro (HUMALOG KWIKPEN) 200 UNIT/ML KwikPen   Other Relevant Orders   Comprehensive metabolic panel   Bayer DCA Hb A1c Waived (STAT)   Aortic atherosclerosis ([place])    Noted on 09/16/19 imaging.  Continue daily statin and ASA for prevention + focus on healthy diet and modest weight loss.        Respiratory   Sleep apnea    Continues to use CPAP faithfully every night and denies any issues with this.        Endocrine   Type 2 diabetes mellitus with proteinuria (HCC) - Primary    Chronic, ongoing, followed by endocrinology with insulin pump in place.  Continue current collaboration and pump settings per endo -- recent note reviewed.  A1C done today per patient request = 6.8% -- urine ALB 30 in March 2022, continue Losartan for kidney protection.  Recommend focus on diet and modest weight loss.  Does endorse poor diet on occasion.  Recommend he continue to monitor BS closely at home and if any consistent <70 immediately alert endo for pump changes.  Return in 3 months.  Relevant Medications   insulin lispro (HUMALOG KWIKPEN) 200 UNIT/ML KwikPen   Hyperlipidemia associated with type 2 diabetes mellitus (HCC)   Relevant Medications   insulin lispro (HUMALOG KWIKPEN) 200 UNIT/ML KwikPen   Other Relevant Orders   Lipid Panel w/o Chol/HDL Ratio   Bayer DCA Hb A1c Waived (STAT)   Type 2 diabetes mellitus with both eyes affected by mild nonproliferative retinopathy without macular edema, with long-term current use of insulin (HCC)    Chronic, ongoing, followed by endocrinology with insulin pump in place + followed by Dr. Matilde Sprang for eye exams.  Continue current collaboration and pump settings per endo.  A1C 6.8% today.  Recommend focus on diet and modest weight loss.  Does endorse poor diet.  Recommend he  continue to monitor BS closely at home and if any consistent <70 immediately alert endo for pump changes.  Return in 3 months.      Relevant Medications   insulin lispro (HUMALOG KWIKPEN) 200 UNIT/ML KwikPen   CKD stage 3 due to type 2 diabetes mellitus ([place])    Noted on recent labs with eGFR 49.  Will recheck today and may adjust HCTZ if ongoing low levels.  Recommend increase hydration at home.  A1c today 6.8%.  Continue Losartan for kidney protection.      Relevant Medications   insulin lispro (HUMALOG KWIKPEN) 200 UNIT/ML KwikPen     Other   Gout    Chronic, stable.  Continue current medication regimen and adjust as needed.  Uric acid up to date.  May need to discontinue HCTZ in future if return of flares presents.      History of prostate cancer    Continue to collaborate with urology as needed.  PSA up to date.      BMI 40.0-44.9, adult ([place])    Recommend focus on modest weight loss with diet changes and regular exercise regimen.        Relevant Medications   insulin lispro (HUMALOG KWIKPEN) 200 UNIT/ML KwikPen   Morbid obesity (HCC)    BMI 41.15 with T2DM, HTN.  Recommended eating smaller high protein, low fat meals more frequently and exercising 30 mins a day 5 times a week with a goal of 10-15lb weight loss in the next 3 months. Patient voiced their understanding and motivation to adhere to these recommendations.       Relevant Medications   insulin lispro (HUMALOG KWIKPEN) 200 UNIT/ML KwikPen   Presence of insulin pump    Continue to collaborate with endocrinology.      Chronic pain syndrome    Chronic, ongoing to cervical spine.  Continue collaboration with pain management, recent note reviewed.      Other Visit Diagnoses     Need for shingles vaccine       Shingrix ordered to pharmacy   Relevant Medications   Zoster Vaccine Adjuvanted [facility]) injection   Zoster Vaccine Adjuvanted [facility]) injection (Start on 07/06/2021)   Flu vaccine need       Flu  vaccine provided today.   Relevant Orders   Flu Vaccine QUAD High Dose(Fluad)        Follow up plan: Return in about 3 months (around 05/06/2021) for T2DM, HTN/HLD, CKD, GOUT, CHRONIC PAIN.

## 2021-02-04 NOTE — Assessment & Plan Note (Signed)
BMI 41.15 with T2DM, HTN.  Recommended eating smaller high protein, low fat meals more frequently and exercising 30 mins a day 5 times a week with a goal of 10-15lb weight loss in the next 3 months. Patient voiced their understanding and motivation to adhere to these recommendations.

## 2021-02-04 NOTE — Assessment & Plan Note (Signed)
Chronic, ongoing to cervical spine.  Continue collaboration with pain management, recent note reviewed.

## 2021-02-04 NOTE — Assessment & Plan Note (Signed)
Chronic, ongoing.  BP at goal today in office.  Recommend he continue to check BP at home twice daily and document for provider visits. Continue current medication regimen at this time, is on max dose multiple medications.  May need to adjust in future and change HCTZ if return of gout flares or decline in kidney function.  CMP today.  Return in 3 months for follow-up.

## 2021-02-05 ENCOUNTER — Telehealth: Payer: Self-pay | Admitting: Nurse Practitioner

## 2021-02-05 LAB — COMPREHENSIVE METABOLIC PANEL
ALT: 10 IU/L (ref 0–44)
AST: 13 IU/L (ref 0–40)
Albumin/Globulin Ratio: 1.6 (ref 1.2–2.2)
Albumin: 4.2 g/dL (ref 3.8–4.8)
Alkaline Phosphatase: 151 IU/L — ABNORMAL HIGH (ref 44–121)
BUN/Creatinine Ratio: 18 (ref 10–24)
BUN: 22 mg/dL (ref 8–27)
Bilirubin Total: 0.4 mg/dL (ref 0.0–1.2)
CO2: 27 mmol/L (ref 20–29)
Calcium: 9.5 mg/dL (ref 8.6–10.2)
Chloride: 99 mmol/L (ref 96–106)
Creatinine, Ser: 1.24 mg/dL (ref 0.76–1.27)
Globulin, Total: 2.7 g/dL (ref 1.5–4.5)
Glucose: 77 mg/dL (ref 65–99)
Potassium: 4.3 mmol/L (ref 3.5–5.2)
Sodium: 138 mmol/L (ref 134–144)
Total Protein: 6.9 g/dL (ref 6.0–8.5)
eGFR: 63 mL/min/{1.73_m2} (ref >59)

## 2021-02-05 LAB — LIPID PANEL W/O CHOL/HDL RATIO
Cholesterol, Total: 99 mg/dL — ABNORMAL LOW (ref 100–199)
HDL: 31 mg/dL — ABNORMAL LOW (ref >39)
LDL Chol Calc (NIH): 51 mg/dL (ref 0–99)
Triglycerides: 82 mg/dL (ref 0–149)
VLDL Cholesterol Cal: 17 mg/dL (ref 5–40)

## 2021-02-05 NOTE — Telephone Encounter (Signed)
Copied from [place] [id number]. Topic: Medicare AWV >> Feb 05, 2021 10:05 AM Cher Nakai R wrote: Reason for CRM:  Left message for patient to call back and schedule the Medicare Annual Wellness Visit (AWV) virtually or by telephone.  Last AWV  10/15/2019  Please schedule at anytime with [facility] Health Advisor.  45 minute appointment  Any questions, please call me at [phone]

## 2021-02-05 NOTE — Progress Notes (Signed)
Contacted via [place] morning Aaron [name], your labs have returned and kidney function normal this check.  Great news!!  Continue drinking good water daily. Cholesterol levels at goal.  Continue all current medications.  Any questions? Keep being amazing!!  Thank you for allowing me to participate in your care.  I appreciate you. Kindest regards, Takako Minckler

## 2021-02-13 ENCOUNTER — Ambulatory Visit (INDEPENDENT_AMBULATORY_CARE_PROVIDER_SITE_OTHER): Payer: Medicare HMO

## 2021-02-13 DIAGNOSIS — Z0000 Encounter for general adult medical examination without abnormal findings: Secondary | ICD-10-CM

## 2021-02-13 NOTE — Progress Notes (Signed)
Subjective:   Aaron [name] is a 70 y.o. male who presents for Medicare Annual/Subsequent preventive examination. I connected with  [name] on 02/13/21 by a audio enabled telemedicine application and verified that I am speaking with the correct person using two identifiers.   I discussed the limitations of evaluation and management by telemedicine. The patient expressed understanding and agreed to proceed.   Location of the patient:home Location of the provider:office Persons participating in the visit: Aaron [name] (patient) Linus Galas, [place]  Review of Systems    Defer to PCO       Objective:    Today's Vitals   02/13/21 1058  PainSc: 0-No pain   There is no height or weight on file to calculate BMI.  Advanced Directives 02/13/2021 11/09/2020 10/23/2018 11/27/2017 11/27/2017 11/14/2017 06/11/2016  Does Patient Have a Medical Advance Directive? No No No No No No No  Would patient like information on creating a medical advance directive? No - Patient declined No - Patient declined - No - Patient declined No - Patient declined No - Patient declined -    Current Medications (verified) Outpatient Encounter Medications as of 02/13/2021  Medication Sig   ACCU-CHEK GUIDE test strip    allopurinol (ZYLOPRIM) 300 MG tablet TAKE 1 TABLET EVERY DAY   amLODipine (NORVASC) 10 MG tablet TAKE 1 TABLET EVERY DAY   ASPIRIN 81 PO every evening.    atorvastatin (LIPITOR) 20 MG tablet TAKE 1 TABLET EVERY DAY   carvedilol (COREG) 25 MG tablet TAKE 1 TABLET TWICE DAILY WITH A MEAL   hydrochlorothiazide (HYDRODIURIL) 25 MG tablet TAKE 1 TABLET EVERY DAY   insulin aspart (NOVOLOG) 100 UNIT/ML injection Inject into the skin. Using via insulin pump  Medtronic MiniMed 630G insulin pump   Insulin Human (INSULIN PUMP) SOLN Inject into the skin.   insulin lispro (HUMALOG KWIKPEN) 200 UNIT/ML KwikPen Inject into the skin.   losartan (COZAAR) 100 MG tablet TAKE 1 TABLET EVERY DAY   tamsulosin  (FLOMAX) 0.4 MG CAPS capsule Take 1 capsule (0.4 mg total) by mouth daily.   [START ON 07/06/2021] Zoster Vaccine Adjuvanted [facility]) injection Inject 0.5 mLs into the muscle once for 1 dose. Dose # 2   gabapentin (NEURONTIN) 600 MG tablet Take 1-1.5 tablets (600-900 mg total) by mouth at bedtime.   No facility-administered encounter medications on file as of 02/13/2021.    Allergies (verified) Glucophage [metformin]   History: Past Medical History:  Diagnosis Date   Cancer ([place]) 2013   prostate (radiation and surgery)   Diabetes mellitus without complication ([place])    Gout    Hyperlipidemia    Hypertension    Sleep apnea    Spinal stenosis in cervical region 06/03/2020   Past Surgical History:  Procedure Laterality Date   CIRCUMCISION     CYST EXCISION Left 2015   arm   deviated septrum  2003   JOINT REPLACEMENT Left 2016   Hip   PROSTATE SURGERY     prostatectomy due to cancer   PROSTATECTOMY  2013   TONSILLECTOMY AND ADENOIDECTOMY  2004   TOTAL HIP ARTHROPLASTY Left 2016   TOTAL HIP ARTHROPLASTY Right 11/27/2017   Procedure: TOTAL HIP ARTHROPLASTY ANTERIOR APPROACH;  Surgeon: Hessie Knows, MD;  Location: [facility] ORS;  Service: Orthopedics;  Laterality: Right;   Family History  Problem Relation Age of Onset   Cancer Mother    Breast cancer Mother    Stroke Father    Cancer Sister  Cancer Maternal Uncle    Stroke Maternal Uncle    Social History   Socioeconomic History   Marital status: Married    Spouse name: Not on file   Number of children: Not on file   Years of education: Not on file   Highest education level: Some college, no degree  Occupational History   Occupation: retired  Tobacco Use   Smoking status: Never   Smokeless tobacco: Never  Vaping Use   Vaping Use: Never used  Substance and Sexual Activity   Alcohol use: No   Drug use: No   Sexual activity: Yes  Other Topics Concern   Not on file  Social History Narrative   Not on file   Social  Determinants of Health   Financial Resource Strain: Low Risk    Difficulty of Paying Living Expenses: Not hard at all  Food Insecurity: No Food Insecurity   Worried About Charity fundraiser in the Last Year: Never true   [place] in the Last Year: Never true  Transportation Needs: No Transportation Needs   Lack of Transportation (Medical): No   Lack of Transportation (Non-Medical): No  Physical Activity: Inactive   Days of Exercise per Week: 0 days   Minutes of Exercise per Session: 0 min  Stress: No Stress Concern Present   Feeling of Stress : Not at all  Social Connections: Moderately Integrated   Frequency of Communication with Friends and Family: Twice a week   Frequency of Social Gatherings with Friends and Family: Twice a week   Attends Religious Services: Never   Printmaker: No   Attends Music therapist: 1 to 4 times per year   Marital Status: Married    Tobacco Counseling Counseling given: Not Answered   Clinical Intake:  Pre-visit preparation completed: Yes  Pain : No/denies pain Pain Score: 0-No pain     Nutritional Risks: None Diabetes: Yes CBG done?: No Did pt. bring in CBG monitor from home?: Yes Glucose Meter Downloaded?: No  How often do you need to have someone help you when you read instructions, pamphlets, or other written materials from your doctor or pharmacy?: 1 - Never  Diabetic?yes  Interpreter Needed?: No  Information entered by :: Linus Galas, [place]   Activities of Daily Living In your present state of health, do you have any difficulty performing the following activities: 11/04/2020  Hearing? N  Vision? N  Difficulty concentrating or making decisions? N  Walking or climbing stairs? N  Dressing or bathing? N  Doing errands, shopping? N  Some recent data might be hidden    Patient Care Team: Venita Lick, NP as PCP - General (Nurse Practitioner) Judi Cong, MD as Consulting  Physician (Endocrinology) Vladimir Faster, [facility] as Pharmacist (Pharmacist)  Indicate any recent Medical Services you may have received from other than [facility] providers in the past year (date may be approximate).     Assessment:   This is a routine wellness examination for [place].  Hearing/Vision screen No results found.  Dietary issues and exercise activities discussed: Current Exercise Habits: The patient does not participate in regular exercise at present, Exercise limited by: None identified   Goals Addressed   None    Depression Screen PHQ 2/9 Scores 02/13/2021 02/13/2021 11/04/2020 10/31/2019 10/15/2019 10/23/2018 08/19/2018  PHQ - 2 Score 0 0 0 0 0 0 0  PHQ- 9 Score 0 - - 0 - - 0  Fall Risk Fall Risk  02/13/2021 11/09/2020 07/15/2020 06/03/2020 05/04/2020  Falls in the past year? 0 0 0 0 0  Number falls in past yr: 0 - - - 0  Injury with Fall? 0 - - - 0  Risk for fall due to : No Fall Risks - - - -  Follow up Falls evaluation completed - - - Falls evaluation completed    [place]:  Any stairs in or around the home? Yes  If so, are there any without handrails? Yes  Home free of loose throw rugs in walkways, pet beds, electrical cords, etc? No  Adequate lighting in your home to reduce risk of falls? Yes   ASSISTIVE DEVICES UTILIZED TO PREVENT FALLS:  Life alert? No  Use of a cane, Channelle Bottger or w/c? No  Grab bars in the bathroom? No  Shower chair or bench in shower? Yes  Elevated toilet seat or a handicapped toilet? Yes   TIMED UP AND GO:  Was the test performed?  N/A .  Length of time to ambulate 10 feet: N/A sec.     Cognitive Function:     6CIT Screen 02/13/2021 10/23/2018  What Year? 0 points 0 points  What month? 0 points 0 points  What time? 0 points 0 points  Count back from 20 0 points 0 points  Months in reverse 0 points 0 points  Repeat phrase 0 points 0 points  Total Score 0 0    Immunizations Immunization History   Administered Date(s) Administered   Fluad Quad(high Dose 65+) 03/28/2019, 02/03/2020, 02/04/2021   Influenza, High Dose Seasonal PF 03/29/2018   Influenza,inj,Quad PF,6+ Mos 07/11/2016, 02/15/2017   Influenza-Unspecified 03/26/2015   PFIZER(Purple Top)SARS-COV-2 Vaccination 08/05/2019, 08/26/2019, 02/25/2020   Pneumococcal Conjugate-13 03/21/2018   Pneumococcal Polysaccharide-23 07/02/2019   Pneumococcal-Unspecified 10/20/1992, 02/20/1999   Tdap 05/31/2011   Zoster, Live 07/11/2016    TDAP status: Up to date  Flu Vaccine status: Up to date  Pneumococcal vaccine status: Up to date  Covid-19 vaccine status: Completed vaccines  Qualifies for Shingles Vaccine? Yes   Zostavax completed No   Shingrix Completed?: No.    Education has been provided regarding the importance of this vaccine. Patient has been advised to call insurance company to determine out of pocket expense if they have not yet received this vaccine. Advised may also receive vaccine at local pharmacy or Health Dept. Verbalized acceptance and understanding.  Screening Tests Health Maintenance  Topic Date Due   COVID-19 Vaccine (4 - Booster for Pfizer series) 06/27/2020   Zoster Vaccines- Shingrix (1 of 2) 05/06/2021 (Originally 07/18/2002)   COLONOSCOPY (Pts 45-50yrs Insurance coverage will need to be confirmed)  03/28/2022 (Originally 06/13/2020)   TETANUS/TDAP  05/30/2021   FOOT EXAM  08/04/2021   HEMOGLOBIN A1C  08/04/2021   OPHTHALMOLOGY EXAM  08/31/2021   INFLUENZA VACCINE  Completed   Hepatitis C Screening  Completed   HPV VACCINES  Aged Out    Health Maintenance  Health Maintenance Due  Topic Date Due   COVID-19 Vaccine (4 - Booster for [place] series) 06/27/2020    Colorectal cancer screening: Type of screening: Colonoscopy. Completed 06/13/10. Repeat every 10 years  Lung Cancer Screening: (Low Dose CT Chest recommended if Age 50-80 years, 30 pack-year currently smoking OR have quit w/in 15years.) does  not qualify.   Lung Cancer Screening Referral: N/A  Additional Screening:  Hepatitis C Screening: does not Qualify Completed   Vision Screening: Recommended annual  ophthalmology exams for early detection of glaucoma and other disorders of the eye. Is the patient up to date with their annual eye exam?  Yes  Who is the provider or what is the name of the office in which the patient attends annual eye exams? Dr. Matilde Sprang If pt is not established with a provider, would they like to be referred to a provider to establish care? No .   Dental Screening: Recommended annual dental exams for proper oral hygiene  Community Resource Referral / Chronic Care Management: CRR required this visit?  No   CCM required this visit?  No      Plan:     I have personally reviewed and noted the following in the patient's chart:   Medical and social history Use of alcohol, tobacco or illicit drugs  Current medications and supplements including opioid prescriptions. Patient is not currently taking opioid prescriptions. Functional ability and status Nutritional status Physical activity Advanced directives List of other physicians Hospitalizations, surgeries, and ER visits in previous 12 months Vitals Screenings to include cognitive, depression, and falls Referrals and appointments  In addition, I have reviewed and discussed with patient certain preventive protocols, quality metrics, and best practice recommendations. A written personalized care plan for preventive services as well as general preventive health recommendations were provided to patient.     Linus Galas, [place]   02/13/2021   Nurse Notes: non face to face 60 minutes  Mr. Aldava , Thank you for taking time to come for your Medicare Wellness Visit. I appreciate your ongoing commitment to your health goals. Please review the following plan we discussed and let me know if I can assist you in the future.   These are the goals we discussed:   Goals       Patient Stated     "I want to stay healthy" (pt-stated)      [place] (see longtitudinal plan of care for additional care plan information)  Current Barriers:  Diabetes: controlled; most recent A1c 6.3%; follows w/ Dr. Gabriel Carina  Attended 7 PT sessions for neck pain but did not feel any benefit. Notes he has f/u with ortho in November.  Microscopic hematuria. Patient saw Urology 12/29/19- UA with microscopic blood, small renal calculi. Cystoscopy 01/03/20 cytology yields no abnormal cells. 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  Follow up in 6 months Current antihyperglycemic regimen: Novolog via pump Receiving Novolog vial assistance through [name] through 05/21/20.  Picked up refill 02/13/20. Last Endo appt. 12/26/19 FBG 87, C peptide 0.7. No changes to pump. Pump is out of warranty exploring new options with Endo. Checking BG QID  114 this am, 160 yesterday. Reports low reading69f 77  mid-afternoon 8/06. He had skipped lunch. Cardiovascular risk reduction: Current hypertensive regimen: amlodipine 10 mg daily, carvedilol 25 mg BID, losartan 100 mg daily, HCTZ 25 mg daily. Has not been checking at home. Not yet gotten a BP cuff. States he will prioritize. BP elevated at Urology but controlled at PCP 02/03/20. Current hyperlipidemia regimen: atorvastatin 20 mg daily; LDL at goal <70 Current antiplatelet regimen: ASA 81 mg daily  Gout: allopurinol 300 mg daily; uric acid at goal ~6. No recent gout flares   Pharmacist Clinical Goal(s):  Over the next 90 days, patient with work with PharmD and primary care provider to address maintained glycemic control  Interventions: Comprehensive medication review performed, medication list updated in electronic medical record Inter-disciplinary care team collaboration (see longitudinal plan of care) Praised for continued improvement in A1c.  Reviewed to contact endo if hypoglycemia develops for pump adjustments.  Encouraged to go ahead and call Dr.  Joycie Peek office to ask them to place a refill request for Novolog from [employer], as Novo is running a few weeks behind in shipping. He verbalizes understanding Reiterated importance of home BP monitoring, given multiple BP medications. Fill history is up to date. Patient confirms he will pursue getting a BP machine Praised for maintenance of goal lipid levels Encouraged to call urology ASAP to schedule an appointment for appropriate work up for hematuria. He verbalized understanding.   Patient Self Care Activities:  Patient will obtain BP cuff and begin checking BP daily, document and provide at future appointments. Patient will check blood glucose regularly, document, and provide at future appointments Patient will take medications as prescribed Patient will report any questions or concerns to provider   Please see past updates related to this goal by clicking on the "Past Updates" button in the selected goal        Other     Manage Chronic Pain      Timeframe:  Short-Term Goal Priority:  High Start Date:                             Expected End Date:                       Follow Up Date 2 month follow up appt   - call for medicine refill 2 or 3 days before it runs out - develop a personal pain management plan - keep track of prescription refills - use ice or heat for pain relief    Why is this important?   Day-to-day life can be hard when you have chronic pain.  Pain medicine is just one piece of the treatment puzzle.  You can try these action steps to help you manage your pain.    Notes:       Monitor and Manage My Blood Sugar-Diabetes Type 2      Timeframe:  Long-Range Goal Priority:  High Start Date:                             Expected End Date:                       Follow Up Date  2  month assessment call   - check blood sugar at prescribed times - check blood sugar if I feel it is too high or too low - take the blood sugar meter to all doctor  visits    Why is this important?   Checking your blood sugar at home helps to keep it from getting very high or very low.  Writing the results in a diary or log helps the doctor know how to care for you.  Your blood sugar log should have the time, date and the results.  Also, write down the amount of insulin or other medicine that you take.  Other information, like what you ate, exercise done and how you were feeling, will also be helpful.     Notes:       Perform Foot Care-Diabetes Type 2      Timeframe:  Long-Range Goal Priority:  High Start Date:  Expected End Date:                       Follow Up Date  3 month assessment call   - check feet daily for cuts, sores or redness - keep feet up while sitting - wear comfortable, well-fitting shoes    Why is this important?   Good foot care is very important when you have diabetes.  There are many things you can do to keep your feet healthy and catch a problem early.    Notes:         This is a list of the screening recommended for you and due dates:  Health Maintenance  Topic Date Due   COVID-19 Vaccine (4 - Booster for Pfizer series) 06/27/2020   Zoster (Shingles) Vaccine (1 of 2) 05/06/2021*   Colon Cancer Screening  03/28/2022*   Tetanus Vaccine  05/30/2021   Complete foot exam   08/04/2021   Hemoglobin A1C  08/04/2021   Eye exam for diabetics  08/31/2021   Flu Shot  Completed   Hepatitis C Screening: USPSTF Recommendation to screen - Ages 18-79 yo.  Completed   HPV Vaccine  Aged Out  *Topic was postponed. The date shown is not the original due date.

## 2021-02-13 NOTE — Patient Instructions (Signed)
Health Maintenance, Male Adopting a healthy lifestyle and getting preventive care are important in promoting health and wellness. Ask your health care provider about: The right schedule for you to have regular tests and exams. Things you can do on your own to prevent diseases and keep yourself healthy. What should I know about diet, weight, and exercise? Eat a healthy diet  Eat a diet that includes plenty of vegetables, fruits, low-fat dairy products, and lean protein. Do not eat a lot of foods that are high in solid fats, added sugars, or sodium. Maintain a healthy weight Body mass index (BMI) is a measurement that can be used to identify possible weight problems. It estimates body fat based on height and weight. Your health care provider can help determine your BMI and help you achieve or maintain a healthy weight. Get regular exercise Get regular exercise. This is one of the most important things you can do for your health. Most adults should: Exercise for at least 150 minutes each week. The exercise should increase your heart rate and make you sweat (moderate-intensity exercise). Do strengthening exercises at least twice a week. This is in addition to the moderate-intensity exercise. Spend less time sitting. Even light physical activity can be beneficial. Watch cholesterol and blood lipids Have your blood tested for lipids and cholesterol at 68 years of age, then have this test every 5 years. You may need to have your cholesterol levels checked more often if: Your lipid or cholesterol levels are high. You are older than 68 years of age. You are at high risk for heart disease. What should I know about cancer screening? Many types of cancers can be detected early and may often be prevented. Depending on your health history and family history, you may need to have cancer screening at various ages. This may include screening for: Colorectal cancer. Prostate cancer. Skin cancer. Lung  cancer. What should I know about heart disease, diabetes, and high blood pressure? Blood pressure and heart disease High blood pressure causes heart disease and increases the risk of stroke. This is more likely to develop in people who have high blood pressure readings, are of African descent, or are overweight. Talk with your health care provider about your target blood pressure readings. Have your blood pressure checked: Every 3-5 years if you are 18-39 years of age. Every year if you are 40 years old or older. If you are between the ages of 65 and 75 and are a current or former smoker, ask your health care provider if you should have a one-time screening for abdominal aortic aneurysm (AAA). Diabetes Have regular diabetes screenings. This checks your fasting blood sugar level. Have the screening done: Once every three years after age 45 if you are at a normal weight and have a low risk for diabetes. More often and at a younger age if you are overweight or have a high risk for diabetes. What should I know about preventing infection? Hepatitis B If you have a higher risk for hepatitis B, you should be screened for this virus. Talk with your health care provider to find out if you are at risk for hepatitis B infection. Hepatitis C Blood testing is recommended for: Everyone born from 1945 through 1965. Anyone with known risk factors for hepatitis C. Sexually transmitted infections (STIs) You should be screened each year for STIs, including gonorrhea and chlamydia, if: You are sexually active and are younger than 68 years of age. You are older than 68 years   of age and your health care provider tells you that you are at risk for this type of infection. Your sexual activity has changed since you were last screened, and you are at increased risk for chlamydia or gonorrhea. Ask your health care provider if you are at risk. Ask your health care provider about whether you are at high risk for HIV.  Your health care provider may recommend a prescription medicine to help prevent HIV infection. If you choose to take medicine to prevent HIV, you should first get tested for HIV. You should then be tested every 3 months for as long as you are taking the medicine. Follow these instructions at home: Lifestyle Do not use any products that contain nicotine or tobacco, such as cigarettes, e-cigarettes, and chewing tobacco. If you need help quitting, ask your health care provider. Do not use street drugs. Do not share needles. Ask your health care provider for help if you need support or information about quitting drugs. Alcohol use Do not drink alcohol if your health care provider tells you not to drink. If you drink alcohol: Limit how much you have to 0-2 drinks a day. Be aware of how much alcohol is in your drink. In the [place] one drink equals one 12 oz bottle of beer (355 mL), one 5 oz glass of wine (148 mL), or one 1 oz glass of hard liquor (44 mL). General instructions Schedule regular health, dental, and eye exams. Stay current with your vaccines. Tell your health care provider if: You often feel depressed. You have ever been abused or do not feel safe at home. Summary Adopting a healthy lifestyle and getting preventive care are important in promoting health and wellness. Follow your health care provider's instructions about healthy diet, exercising, and getting tested or screened for diseases. Follow your health care provider's instructions on monitoring your cholesterol and blood pressure. This information is not intended to replace advice given to you by your health care provider. Make sure you discuss any questions you have with your health care provider. Document Revised: 07/16/2020 Document Reviewed: 05/01/2018 Elsevier Patient Education  2022 Elsevier Inc.  

## 2021-02-20 DIAGNOSIS — E1165 Type 2 diabetes mellitus with hyperglycemia: Secondary | ICD-10-CM | POA: Diagnosis not present

## 2021-02-28 DIAGNOSIS — H04213 Epiphora due to excess lacrimation, bilateral lacrimal glands: Secondary | ICD-10-CM | POA: Diagnosis not present

## 2021-02-28 DIAGNOSIS — E113293 Type 2 diabetes mellitus with mild nonproliferative diabetic retinopathy without macular edema, bilateral: Secondary | ICD-10-CM | POA: Diagnosis not present

## 2021-02-28 DIAGNOSIS — H40013 Open angle with borderline findings, low risk, bilateral: Secondary | ICD-10-CM | POA: Diagnosis not present

## 2021-02-28 DIAGNOSIS — E113291 Type 2 diabetes mellitus with mild nonproliferative diabetic retinopathy without macular edema, right eye: Secondary | ICD-10-CM | POA: Diagnosis not present

## 2021-02-28 DIAGNOSIS — H52223 Regular astigmatism, bilateral: Secondary | ICD-10-CM | POA: Diagnosis not present

## 2021-02-28 DIAGNOSIS — H524 Presbyopia: Secondary | ICD-10-CM | POA: Diagnosis not present

## 2021-02-28 DIAGNOSIS — H2513 Age-related nuclear cataract, bilateral: Secondary | ICD-10-CM | POA: Diagnosis not present

## 2021-02-28 DIAGNOSIS — H5203 Hypermetropia, bilateral: Secondary | ICD-10-CM | POA: Diagnosis not present

## 2021-02-28 DIAGNOSIS — E113292 Type 2 diabetes mellitus with mild nonproliferative diabetic retinopathy without macular edema, left eye: Secondary | ICD-10-CM | POA: Diagnosis not present

## 2021-02-28 LAB — HM DIABETES EYE EXAM

## 2021-03-14 DIAGNOSIS — E1165 Type 2 diabetes mellitus with hyperglycemia: Secondary | ICD-10-CM | POA: Diagnosis not present

## 2021-03-16 ENCOUNTER — Other Ambulatory Visit: Payer: Self-pay | Admitting: Nurse Practitioner

## 2021-03-17 NOTE — Telephone Encounter (Signed)
Requested Prescriptions  Pending Prescriptions Disp Refills  . allopurinol (ZYLOPRIM) 300 MG tablet [Pharmacy Med Name: ALLOPURINOL 300 MG Tablet] 90 tablet 3    Sig: TAKE 1 TABLET EVERY DAY     Endocrinology:  Gout Agents Passed - 03/16/2021 10:09 PM      Passed - Uric Acid in normal range and within 360 days    Uric Acid  Date Value Ref Range Status  11/04/2020 5.1 3.8 - 8.4 mg/dL Final    Comment:               Therapeutic target for gout patients: <6.0         Passed - Cr in normal range and within 360 days    Creatinine  Date Value Ref Range Status  10/26/2012 1.49 (H) 0.60 - 1.30 mg/dL Final   Creatinine, Ser  Date Value Ref Range Status  02/04/2021 1.24 0.76 - 1.27 mg/dL Final         Passed - Valid encounter within last 12 months    Recent Outpatient Visits          1 month ago 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

## 2021-03-19 ENCOUNTER — Other Ambulatory Visit: Payer: Self-pay | Admitting: Student in an Organized Health Care Education/Training Program

## 2021-03-23 DIAGNOSIS — E1165 Type 2 diabetes mellitus with hyperglycemia: Secondary | ICD-10-CM | POA: Diagnosis not present

## 2021-03-28 DIAGNOSIS — R1313 Dysphagia, pharyngeal phase: Secondary | ICD-10-CM | POA: Diagnosis not present

## 2021-03-28 DIAGNOSIS — K449 Diaphragmatic hernia without obstruction or gangrene: Secondary | ICD-10-CM | POA: Diagnosis not present

## 2021-03-28 DIAGNOSIS — K219 Gastro-esophageal reflux disease without esophagitis: Secondary | ICD-10-CM | POA: Diagnosis not present

## 2021-03-28 DIAGNOSIS — K5730 Diverticulosis of large intestine without perforation or abscess without bleeding: Secondary | ICD-10-CM | POA: Diagnosis not present

## 2021-03-28 DIAGNOSIS — Z86010 Personal history of colonic polyps: Secondary | ICD-10-CM | POA: Diagnosis not present

## 2021-04-04 DIAGNOSIS — E113293 Type 2 diabetes mellitus with mild nonproliferative diabetic retinopathy without macular edema, bilateral: Secondary | ICD-10-CM | POA: Diagnosis not present

## 2021-04-04 DIAGNOSIS — Z794 Long term (current) use of insulin: Secondary | ICD-10-CM | POA: Diagnosis not present

## 2021-04-11 DIAGNOSIS — I10 Essential (primary) hypertension: Secondary | ICD-10-CM | POA: Diagnosis not present

## 2021-04-11 DIAGNOSIS — E669 Obesity, unspecified: Secondary | ICD-10-CM | POA: Diagnosis not present

## 2021-04-11 DIAGNOSIS — E1169 Type 2 diabetes mellitus with other specified complication: Secondary | ICD-10-CM | POA: Diagnosis not present

## 2021-04-11 DIAGNOSIS — Z794 Long term (current) use of insulin: Secondary | ICD-10-CM | POA: Diagnosis not present

## 2021-04-11 DIAGNOSIS — E113293 Type 2 diabetes mellitus with mild nonproliferative diabetic retinopathy without macular edema, bilateral: Secondary | ICD-10-CM | POA: Diagnosis not present

## 2021-04-11 DIAGNOSIS — Z9641 Presence of insulin pump (external) (internal): Secondary | ICD-10-CM | POA: Diagnosis not present

## 2021-04-19 DIAGNOSIS — M4802 Spinal stenosis, cervical region: Secondary | ICD-10-CM | POA: Diagnosis not present

## 2021-04-22 ENCOUNTER — Other Ambulatory Visit: Payer: Self-pay | Admitting: Nurse Practitioner

## 2021-04-22 MED ORDER — TAMSULOSIN HCL 0.4 MG PO CAPS: 0.4000 mg | ORAL_CAPSULE | Freq: Every day | ORAL | 4 refills | Status: DC

## 2021-05-02 ENCOUNTER — Encounter: Payer: Self-pay | Admitting: Urology

## 2021-05-02 DIAGNOSIS — E11649 Type 2 diabetes mellitus with hypoglycemia without coma: Secondary | ICD-10-CM | POA: Diagnosis not present

## 2021-05-04 ENCOUNTER — Telehealth: Payer: Self-pay

## 2021-05-04 NOTE — Chronic Care Management (AMB) (Signed)
° ° °  Chronic Care Management Pharmacy Assistant   Name: ZHANE BLUITT  MRN: [id number] DOB: [birth year]  Reason for Encounter: Disease State Hypertension  Recent office visits:  02/04/21-Jolene Ned Card, NP (PCP) General follow up visit. Labs ordered. Flu vaccine given. Follow up in 3 months.  Recent consult visits:  04/19/21-Steven Macon Large (Neurosurgery) Seen for a evaluation. 04/11/21-Anna Melissa Solum (Endocrinology) Seen for general follow up visit. Follow up in 4 months. 03/28/21-[place] Octavia Bruckner (Gastroenterology) Notes not available. 02/28/21-Keth Nice [facility]) Notes not available. 12/06/20-Anna Melissa Solum (Endocrinology) Follow up visit. Follow up in 4 months.  Hospital visits:  None in previous 6 months  Medications: Outpatient Encounter Medications as of 05/04/2021  Medication Sig   ACCU-CHEK GUIDE test strip    allopurinol (ZYLOPRIM) 300 MG tablet TAKE 1 TABLET EVERY DAY   amLODipine (NORVASC) 10 MG tablet TAKE 1 TABLET EVERY DAY   ASPIRIN 81 PO every evening.    atorvastatin (LIPITOR) 20 MG tablet TAKE 1 TABLET EVERY DAY   carvedilol (COREG) 25 MG tablet TAKE 1 TABLET TWICE DAILY WITH A MEAL   gabapentin (NEURONTIN) 600 MG tablet Take 1-1.5 tablets (600-900 mg total) by mouth at bedtime.   hydrochlorothiazide (HYDRODIURIL) 25 MG tablet TAKE 1 TABLET EVERY DAY   insulin aspart (NOVOLOG) 100 UNIT/ML injection Inject into the skin. Using via insulin pump  Medtronic MiniMed 630G insulin pump   Insulin Human (INSULIN PUMP) SOLN Inject into the skin.   insulin lispro (HUMALOG KWIKPEN) 200 UNIT/ML KwikPen Inject into the skin.   losartan (COZAAR) 100 MG tablet TAKE 1 TABLET EVERY DAY   tamsulosin (FLOMAX) 0.4 MG CAPS capsule Take 1 capsule (0.4 mg total) by mouth daily.   [START ON 07/06/2021] Zoster Vaccine Adjuvanted [facility]) injection Inject 0.5 mLs into the muscle once for 1 dose. Dose # 2   No facility-administered encounter medications on file as of  05/04/2021.   Current antihypertensive regimen:  Losartan 100 mg qd HCTZ 25 mg qd Carvedilol 25 mg bid Amlodipine 10 mg qd  How often are you checking your Blood Pressure? 3-5x per week  Current home BP readings:       Systolic TDHRC:[id number]       Diastolic XMIWO:03-21  What recent interventions/DTPs have been made by any provider to improve Blood Pressure control since last CPP Visit: None noted  Any recent hospitalizations or ED visits since last visit with CPP? No  What diet changes have been made to improve Blood Pressure Control?  Patient states he has cut soda out of his diet.   What exercise is being done to improve your Blood Pressure Control?  Patient states he does not do a lot of exercise due to his hips being in pain due to the cold weather.   Adherence Review: Is the patient currently on ACE/ARB medication? Yes Does the patient have >5 day gap between last estimated fill dates? No  Scheduled appointment with Clinical pharmacist on 06/27/20 @9am   Care Gaps: COVID-19 Vaccine:Overdue since 04/21/2020  Star Rating Drugs: Losartan 100 mg Last filled:03/25/21 90 DS Atorvastatin 20 mg Last filled:03/02/21 90 DS  [name], [place]

## 2021-05-06 ENCOUNTER — Encounter: Payer: Self-pay | Admitting: Nurse Practitioner

## 2021-05-06 ENCOUNTER — Ambulatory Visit (INDEPENDENT_AMBULATORY_CARE_PROVIDER_SITE_OTHER): Payer: Medicare HMO | Admitting: Nurse Practitioner

## 2021-05-06 ENCOUNTER — Other Ambulatory Visit: Payer: Self-pay

## 2021-05-06 VITALS — BP 133/75 | HR 66 | Temp 98.2°F | Ht 73.0 in | Wt 324.4 lb

## 2021-05-06 DIAGNOSIS — E1122 Type 2 diabetes mellitus with diabetic chronic kidney disease: Secondary | ICD-10-CM | POA: Diagnosis not present

## 2021-05-06 DIAGNOSIS — Z8546 Personal history of malignant neoplasm of prostate: Secondary | ICD-10-CM

## 2021-05-06 DIAGNOSIS — G894 Chronic pain syndrome: Secondary | ICD-10-CM

## 2021-05-06 DIAGNOSIS — E1159 Type 2 diabetes mellitus with other circulatory complications: Secondary | ICD-10-CM | POA: Diagnosis not present

## 2021-05-06 DIAGNOSIS — Z6841 Body Mass Index (BMI) 40.0 and over, adult: Secondary | ICD-10-CM | POA: Diagnosis not present

## 2021-05-06 DIAGNOSIS — E113293 Type 2 diabetes mellitus with mild nonproliferative diabetic retinopathy without macular edema, bilateral: Secondary | ICD-10-CM

## 2021-05-06 DIAGNOSIS — E785 Hyperlipidemia, unspecified: Secondary | ICD-10-CM

## 2021-05-06 DIAGNOSIS — E1169 Type 2 diabetes mellitus with other specified complication: Secondary | ICD-10-CM

## 2021-05-06 DIAGNOSIS — I700 Atherosclerosis of aorta: Secondary | ICD-10-CM | POA: Diagnosis not present

## 2021-05-06 DIAGNOSIS — N183 Chronic kidney disease, stage 3 unspecified: Secondary | ICD-10-CM

## 2021-05-06 DIAGNOSIS — R809 Proteinuria, unspecified: Secondary | ICD-10-CM

## 2021-05-06 DIAGNOSIS — G4733 Obstructive sleep apnea (adult) (pediatric): Secondary | ICD-10-CM

## 2021-05-06 DIAGNOSIS — I152 Hypertension secondary to endocrine disorders: Secondary | ICD-10-CM

## 2021-05-06 DIAGNOSIS — E1129 Type 2 diabetes mellitus with other diabetic kidney complication: Secondary | ICD-10-CM | POA: Diagnosis not present

## 2021-05-06 DIAGNOSIS — M1A0720 Idiopathic chronic gout, left ankle and foot, without tophus (tophi): Secondary | ICD-10-CM

## 2021-05-06 DIAGNOSIS — Z9641 Presence of insulin pump (external) (internal): Secondary | ICD-10-CM

## 2021-05-06 DIAGNOSIS — Z794 Long term (current) use of insulin: Secondary | ICD-10-CM

## 2021-05-06 NOTE — Assessment & Plan Note (Signed)
BMI 42.80 with T2DM, HTN.  Recommended eating smaller high protein, low fat meals more frequently and exercising 30 mins a day 5 times a week with a goal of 10-15lb weight loss in the next 3 months. Patient voiced their understanding and motivation to adhere to these recommendations.

## 2021-05-06 NOTE — Assessment & Plan Note (Signed)
Chronic, ongoing to cervical spine.  Continue collaboration with pain management and neurosurgery on as needed basis, recent note reviewed.  Will takeover Gabapentin refills when needed.

## 2021-05-06 NOTE — Assessment & Plan Note (Signed)
Continue to collaborate with urology as needed.  PSA up to date.

## 2021-05-06 NOTE — Assessment & Plan Note (Signed)
Noted on 09/16/19 imaging.  Continue daily statin and ASA for prevention + focus on healthy diet and modest weight loss.

## 2021-05-06 NOTE — Patient Instructions (Signed)

## 2021-05-06 NOTE — Assessment & Plan Note (Signed)
Chronic, ongoing, followed by endocrinology with insulin pump in place.  Continue current collaboration and pump settings per endo -- recent note reviewed.  A1C recent 6.8% -- urine ALB 30 in March 2022, continue Losartan for kidney protection.  Recommend focus on diet and modest weight loss.  Does endorse poor diet on occasion.  Recommend he continue to monitor BS closely at home and if any consistent <70 immediately alert endo for pump changes.  Return in 3 months.

## 2021-05-06 NOTE — Assessment & Plan Note (Signed)
Chronic, ongoing, followed by endocrinology with insulin pump in place + followed by Dr. Matilde Sprang for eye exams.  Continue current collaboration and pump settings per endo.  A1C 6.8% recent check.  Recommend focus on diet and modest weight loss.  Recommend he continue to monitor BS closely at home and if any consistent <70 immediately alert endo for pump changes.  Return in 3 months.

## 2021-05-06 NOTE — Assessment & Plan Note (Signed)
Recommend focus on modest weight loss with diet changes and regular exercise regimen.   

## 2021-05-06 NOTE — Assessment & Plan Note (Signed)
Continues to use CPAP faithfully every night and denies any issues with this.

## 2021-05-06 NOTE — Assessment & Plan Note (Signed)
Chronic, ongoing.  BP at goal today in office.  Recommend he continue to check BP at home twice daily and document for provider visits. Continue current medication regimen at this time, is on max dose multiple medications.  May need to adjust in future and change HCTZ if return of gout flares or decline in kidney function.  Labs up to date.  Return in 3 months for follow-up.

## 2021-05-06 NOTE — Assessment & Plan Note (Signed)
Chronic, ongoing.  Continue current medication regimen and adjust as needed.  Lipid panel up to date. 

## 2021-05-06 NOTE — Assessment & Plan Note (Signed)
Chronic, stable.  Continue current medication regimen and adjust as needed.  Uric acid up to date.  May need to discontinue HCTZ in future if return of flares presents.

## 2021-05-06 NOTE — Progress Notes (Signed)
BP 133/75    Pulse 66    Temp 98.2 F (36.8 C)    Ht 6' 1"  (1.854 m)    Wt (!) 324 lb 6.4 oz (147.1 kg)    SpO2 98%    BMI 42.80 kg/m    Subjective:    Patient ID: Aaron [name], male    DOB: 1950/08/31, 70 y.o.   MRN: [id number]  HPI: LIBORIO SACCENTE is a 70 y.o. male  Chief Complaint  Patient presents with   Diabetes   Hyperlipidemia   Hypertension   Pain   Gout   Chronic Kidney Disease   DIABETES Followed by Dr. Gabriel Carina with last visit 04/04/21 with 6.8%.    Uses Tandem T:slim insulin pump (04/2020 placed), per Dr. Gabriel Carina last note 04/04/21: "Basal rates Correction factor Carb ratio Target BG 12 am 2.45 units/hr 20 8 110 4 am 2.0 units/hr 20 8 110 8:30 am 1.2 units/hr 20 8 110 11 am 1.8 units.hr 20 6 110 8 pm 2.2 units/hr 20 6 110  24-hr basal = 46.8 units Humalog U200 insulin (equals 93.6 units of U100 insulin) " Hypoglycemic episodes: a couple episodes Polydipsia/polyuria: no Visual disturbance: no Chest pain: no Paresthesias: no Glucose Monitoring: yes             Accucheck frequency: QID -- on average 120-130              Fasting glucose:              Post prandial:              Evening:              Before meals:  Taking Insulin?: yes             Long acting insulin:             Short acting insulin: Blood Pressure Monitoring: not checking Retinal Examination: Up to Date = Dr. Matilde Sprang for retinopathy and last seen 09/09/20 Foot Exam: Up to Date Pneumovax: Up to Date Influenza: Up to Date Aspirin: yes   CHRONIC KIDNEY DISEASE Last labs noted CRT 1.3 and eGFR 67 with Dr. Gabriel Carina.  CKD status: stable Medications renally dose: yes Previous renal evaluation: no Pneumovax:  Up to Date Influenza Vaccine:  Up to Date   HISTORY OF PROSTATE CANCER: Prostate was removed in 2013.  Last urology visit was 07/16/20.  Cystoscopy last 12/29/2019.  No changes recent visit and is to follow-up in one year.  Continues on Flomax.   HYPERTENSION / HYPERLIPIDEMIA Continues  on Losartan 100 MG, HCTZ 25 MG daily, Carvedilol 25 MG BID, Amlodipine 10 MG daily.  Takes Lipitor 20 MG.    Has OSA and uses CPAP every night.  History on 09/16/19 imaging aortic atherosclerosis noted. Satisfied with current treatment? yes Duration of hypertension: chronic BP monitoring frequency: a couple times a day BP range: this morning 126/68 BP medication side effects: no Duration of hyperlipidemia: chronic Cholesterol medication side effects: no Cholesterol supplements: none Medication compliance: good compliance Aspirin: yes Recent stressors: no Recurrent headaches: no Visual changes: no Palpitations: no Dyspnea: no Chest pain: no Lower extremity edema: no Dizzy/lightheaded: no   CHRONIC NECK PAIN Followed by pain clinic in past and last saw Dr. Holley Raring on 11/09/20 for chronic pain and spinal stenosis in cervical spine -- to return as needed.  Is taking Gabapentin 600 MG QHS.  Saw Dr. Lacinda Axon on 04/19/21 and was discharged from there, to return  as needed.   Marland Kitchen Has history of gout, taking Allopurinol --- has not had flare in years -- left great toe. Treatments attempted: rest, ice, heat, APAP and physical therapy , Gabapentin Compliant with recommended treatment: yes Relief with NSAIDs?:  No NSAIDs Taken Location:midline Duration:chronic Severity: moderate Quality: dull and aching Frequency: constant Radiation: none Aggravating factors: lifting, movement and bending Alleviating factors: rest, ice, heat, APAP and muscle relaxer. Gabapentin Weakness:  no Paresthesias / decreased sensation:  no  Fevers:  no   Relevant past medical, surgical, family and social history reviewed and updated as indicated. Interim medical history since our last visit reviewed. Allergies and medications reviewed and updated.  Review of Systems  Constitutional:  Negative for activity change, diaphoresis, fatigue and fever.  Respiratory:  Negative for cough, chest tightness, shortness of breath  and wheezing.   Cardiovascular:  Negative for chest pain, palpitations and leg swelling.  Gastrointestinal: Negative.   Endocrine: Negative for cold intolerance, heat intolerance, polydipsia, polyphagia and polyuria.  Musculoskeletal:  Positive for neck pain.  Neurological: Negative.   Psychiatric/Behavioral: Negative.     Per HPI unless specifically indicated above     Objective:    BP 133/75    Pulse 66    Temp 98.2 F (36.8 C)    Ht 6' 1"  (1.854 m)    Wt (!) 324 lb 6.4 oz (147.1 kg)    SpO2 98%    BMI 42.80 kg/m   Wt Readings from Last 3 Encounters:  05/06/21 (!) 324 lb 6.4 oz (147.1 kg)  02/04/21 (!) 329 lb 3.2 oz (149.3 kg)  11/09/20 (!) 327 lb (148.3 kg)    Physical Exam Vitals and nursing note reviewed.  Constitutional:      General: He is awake. He is not in acute distress.    Appearance: Normal appearance. He is well-groomed. He is morbidly obese. He is not ill-appearing or toxic-appearing.  HENT:     Head: Normocephalic and atraumatic.     Right Ear: Hearing and external ear normal. No drainage.     Left Ear: Hearing and external ear normal. No drainage.  Eyes:     General: No scleral icterus.       Right eye: No discharge.        Left eye: No discharge.     Pupils: Pupils are equal, round, and reactive to light.  Cardiovascular:     Rate and Rhythm: Normal rate and regular rhythm.     Pulses: Normal pulses.     Heart sounds: Normal heart sounds. No murmur heard.   No friction rub. No gallop.  Pulmonary:     Effort: Pulmonary effort is normal. No respiratory distress.     Breath sounds: Normal breath sounds. No stridor. No wheezing, rhonchi or rales.  Chest:     Chest wall: No tenderness.  Musculoskeletal:     Cervical back: Normal range of motion and neck supple.  Skin:    General: Skin is warm and dry.     Capillary Refill: Capillary refill takes less than 2 seconds.     Coloration: Skin is not jaundiced or pale.     Findings: No bruising, erythema,  lesion or rash.  Neurological:     General: No focal deficit present.     Mental Status: He is alert and oriented to person, place, and time. Mental status is at baseline.  Psychiatric:        Mood and Affect: Mood normal.  Behavior: Behavior normal. Behavior is cooperative.        Thought Content: Thought content normal.        Judgment: Judgment normal.   Results for orders placed or performed in visit on 03/01/21  HM DIABETES EYE EXAM  Result Value Ref Range   HM Diabetic Eye Exam Retinopathy (A) No Retinopathy      Assessment & Plan:   Problem List Items Addressed This Visit       Cardiovascular and Mediastinum   Aortic atherosclerosis ([place])    Noted on 09/16/19 imaging.  Continue daily statin and ASA for prevention + focus on healthy diet and modest weight loss.      Hypertension associated with diabetes (HCC)    Chronic, ongoing.  BP at goal today in office.  Recommend he continue to check BP at home twice daily and document for provider visits. Continue current medication regimen at this time, is on max dose multiple medications.  May need to adjust in future and change HCTZ if return of gout flares or decline in kidney function.  Labs up to date.  Return in 3 months for follow-up.        Respiratory   Sleep apnea    Continues to use CPAP faithfully every night and denies any issues with this.        Endocrine   CKD stage 3 due to type 2 diabetes mellitus ([place])    eGFR 67 on recent labs with endocrinology, improved.  Will recheck next visit and may adjust HCTZ if low levels.  Recommend increase hydration at home.  A1c recent 6.8%.  Continue Losartan for kidney protection.      Hyperlipidemia associated with type 2 diabetes mellitus (HCC)    Chronic, ongoing.  Continue current medication regimen and adjust as needed.  Lipid panel up to date.       Type 2 diabetes mellitus with both eyes affected by mild nonproliferative retinopathy without macular edema, with  long-term current use of insulin (HCC)    Chronic, ongoing, followed by endocrinology with insulin pump in place + followed by Dr. Matilde Sprang for eye exams.  Continue current collaboration and pump settings per endo.  A1C 6.8% recent check.  Recommend focus on diet and modest weight loss.  Recommend he continue to monitor BS closely at home and if any consistent <70 immediately alert endo for pump changes.  Return in 3 months.      Type 2 diabetes mellitus with proteinuria (HCC) - Primary    Chronic, ongoing, followed by endocrinology with insulin pump in place.  Continue current collaboration and pump settings per endo -- recent note reviewed.  A1C recent 6.8% -- urine ALB 30 in March 2022, continue Losartan for kidney protection.  Recommend focus on diet and modest weight loss.  Does endorse poor diet on occasion.  Recommend he continue to monitor BS closely at home and if any consistent <70 immediately alert endo for pump changes.  Return in 3 months.        Other   BMI 40.0-44.9, adult ([place])    Recommend focus on modest weight loss with diet changes and regular exercise regimen.        Chronic pain syndrome    Chronic, ongoing to cervical spine.  Continue collaboration with pain management and neurosurgery on as needed basis, recent note reviewed.  Will takeover Gabapentin refills when needed.      Gout    Chronic, stable.  Continue current medication regimen  and adjust as needed.  Uric acid up to date.  May need to discontinue HCTZ in future if return of flares presents.      History of prostate cancer    Continue to collaborate with urology as needed.  PSA up to date.      Morbid obesity (HCC)    BMI 42.80 with T2DM, HTN.  Recommended eating smaller high protein, low fat meals more frequently and exercising 30 mins a day 5 times a week with a goal of 10-15lb weight loss in the next 3 months. Patient voiced their understanding and motivation to adhere to these recommendations.        Presence of insulin pump    Continue to collaborate with endocrinology.        Follow up plan: Return in about 3 months (around 08/04/2021) for T2DM, HTN/HLD, GOUT, CKD, H/O PROSTATE CA.

## 2021-05-06 NOTE — Assessment & Plan Note (Signed)
eGFR 67 on recent labs with endocrinology, improved.  Will recheck next visit and may adjust HCTZ if low levels.  Recommend increase hydration at home.  A1c recent 6.8%.  Continue Losartan for kidney protection.

## 2021-05-06 NOTE — Assessment & Plan Note (Signed)
Continue to collaborate with endocrinology. 

## 2021-05-14 ENCOUNTER — Other Ambulatory Visit: Payer: Self-pay | Admitting: Nurse Practitioner

## 2021-05-14 DIAGNOSIS — E1159 Type 2 diabetes mellitus with other circulatory complications: Secondary | ICD-10-CM

## 2021-05-14 NOTE — Telephone Encounter (Signed)
Requested Prescriptions  Pending Prescriptions Disp Refills   amLODipine (NORVASC) 10 MG tablet [Pharmacy Med Name: AMLODIPINE BESYLATE 10 MG Tablet] 90 tablet 1    Sig: TAKE 1 TABLET EVERY DAY     Cardiovascular:  Calcium Channel Blockers Passed - 05/14/2021  3:30 AM      Passed - Last BP in normal range    BP Readings from Last 1 Encounters:  05/06/21 133/75         Passed - Valid encounter within last 6 months    Recent Outpatient Visits          1 week ago Type 2 diabetes mellitus with proteinuria ([place])   [place], Jolene T, NP   3 months ago Type 2 diabetes mellitus with proteinuria ([place])   [place], Jolene T, NP   6 months ago Type 2 diabetes mellitus with proteinuria ([place])   [place], Jolene T, NP   9 months ago Type 2 diabetes mellitus with proteinuria ([place])   [place], Jolene T, NP   1 year ago Diabetes mellitus associated with hormonal etiology ([place])   [place], Henrine Screws T, NP      Future Appointments            In 2 months Stoio[place], Ronda Fairly, MD [place]   In 2 months [place], Barbaraann Faster, NP [place], [facility]            hydrochlorothiazide (HYDRODIURIL) 25 MG tablet [employer] Med Name: HYDROCHLOROTHIAZIDE 25 MG Tablet] 90 tablet 1    Sig: TAKE 1 TABLET EVERY DAY     Cardiovascular: Diuretics - Thiazide Passed - 05/14/2021  3:30 AM      Passed - Ca in normal range and within 360 days    Calcium  Date Value Ref Range Status  02/04/2021 9.5 8.6 - 10.2 mg/dL Final   Calcium, Total  Date Value Ref Range Status  10/26/2012 9.2 8.5 - 10.1 mg/dL Final         Passed - Cr in normal range and within 360 days    Creatinine  Date Value Ref Range Status  10/26/2012 1.49 (H) 0.60 - 1.30 mg/dL Final   Creatinine, Ser  Date Value Ref Range Status  02/04/2021 1.24 0.76 - 1.27 mg/dL Final         Passed - K in  normal range and within 360 days    Potassium  Date Value Ref Range Status  02/04/2021 4.3 3.5 - 5.2 mmol/L Final  10/26/2012 3.9 3.5 - 5.1 mmol/L Final         Passed - Na in normal range and within 360 days    Sodium  Date Value Ref Range Status  02/04/2021 138 134 - 144 mmol/L Final  10/26/2012 138 136 - 145 mmol/L Final         Passed - Last BP in normal range    BP Readings from Last 1 Encounters:  05/06/21 133/75         Passed - Valid encounter within last 6 months    Recent Outpatient Visits          1 week ago Type 2 diabetes mellitus with proteinuria ([place])   [place], Jolene T, NP   3 months ago Type 2 diabetes mellitus with proteinuria ([place])   [place], Jolene T, NP   6 months ago Type 2 diabetes mellitus with  proteinuria ([place])   [facility] [place], [place] T, NP   9 months ago Type 2 diabetes mellitus with proteinuria ([place])   [place], [place] T, NP   1 year ago Diabetes mellitus associated with hormonal etiology ([place])   [place], Jolene T, NP      Future Appointments            In 2 months Stoioff, Ronda Fairly, MD [place]   In 2 months [place], Barbaraann Faster, NP [employer], [place]

## 2021-05-16 ENCOUNTER — Other Ambulatory Visit: Payer: Self-pay | Admitting: Student in an Organized Health Care Education/Training Program

## 2021-05-26 ENCOUNTER — Telehealth: Payer: Self-pay | Admitting: Nurse Practitioner

## 2021-05-26 MED ORDER — GABAPENTIN 600 MG PO TABS: 600.0000 mg | ORAL_TABLET | Freq: Every evening | ORAL | 4 refills | Status: DC

## 2021-05-26 NOTE — Telephone Encounter (Signed)
Routing to provider to advise if medication refill is appropriate.

## 2021-05-26 NOTE — Telephone Encounter (Signed)
Copied from [place] [phone]. Topic: Quick Communication - Rx Refill/Question >> May 26, 2021 11:31 AM [place], Brayton Layman A wrote: Pts medication perscribed by Dr Carlus Pavlov was denied / discontinued so pt called in trying to get it filled by Marnee Guarneri, pt was advised to contact the perscribing office [facility] since [place] did not prescribe gabapentin (NEURONTIN) 600 MG tablet . Pt still requested a message still be sent to see if this could be filled by [facility], please advise.

## 2021-06-02 DIAGNOSIS — E1165 Type 2 diabetes mellitus with hyperglycemia: Secondary | ICD-10-CM | POA: Diagnosis not present

## 2021-06-21 ENCOUNTER — Encounter: Payer: Self-pay | Admitting: Internal Medicine

## 2021-06-22 ENCOUNTER — Ambulatory Visit: Payer: Medicare HMO | Admitting: Certified Registered"

## 2021-06-22 ENCOUNTER — Encounter: Payer: Self-pay | Admitting: Internal Medicine

## 2021-06-22 ENCOUNTER — Ambulatory Visit
Admission: RE | Admit: 2021-06-22 | Discharge: 2021-06-22 | Disposition: A | Discharge status: Home / Self Care | Payer: Medicare HMO | Source: Ambulatory Visit | Attending: Internal Medicine | Admitting: Internal Medicine

## 2021-06-22 ENCOUNTER — Encounter
Admission: RE | Disposition: A | Discharge status: Home / Self Care | Payer: Self-pay | Source: Ambulatory Visit | Attending: Internal Medicine

## 2021-06-22 DIAGNOSIS — K635 Polyp of colon: Secondary | ICD-10-CM | POA: Diagnosis not present

## 2021-06-22 DIAGNOSIS — Z6841 Body Mass Index (BMI) 40.0 and over, adult: Secondary | ICD-10-CM | POA: Diagnosis not present

## 2021-06-22 DIAGNOSIS — I129 Hypertensive chronic kidney disease with stage 1 through stage 4 chronic kidney disease, or unspecified chronic kidney disease: Secondary | ICD-10-CM | POA: Diagnosis not present

## 2021-06-22 DIAGNOSIS — D127 Benign neoplasm of rectosigmoid junction: Secondary | ICD-10-CM | POA: Diagnosis not present

## 2021-06-22 DIAGNOSIS — K5730 Diverticulosis of large intestine without perforation or abscess without bleeding: Secondary | ICD-10-CM | POA: Insufficient documentation

## 2021-06-22 DIAGNOSIS — D128 Benign neoplasm of rectum: Secondary | ICD-10-CM | POA: Diagnosis not present

## 2021-06-22 DIAGNOSIS — K640 First degree hemorrhoids: Secondary | ICD-10-CM | POA: Insufficient documentation

## 2021-06-22 DIAGNOSIS — K649 Unspecified hemorrhoids: Secondary | ICD-10-CM | POA: Diagnosis not present

## 2021-06-22 DIAGNOSIS — Z86010 Personal history of colonic polyps: Secondary | ICD-10-CM | POA: Insufficient documentation

## 2021-06-22 DIAGNOSIS — Z1211 Encounter for screening for malignant neoplasm of colon: Secondary | ICD-10-CM | POA: Insufficient documentation

## 2021-06-22 DIAGNOSIS — Z79899 Other long term (current) drug therapy: Secondary | ICD-10-CM | POA: Insufficient documentation

## 2021-06-22 DIAGNOSIS — D122 Benign neoplasm of ascending colon: Secondary | ICD-10-CM | POA: Diagnosis not present

## 2021-06-22 DIAGNOSIS — D120 Benign neoplasm of cecum: Secondary | ICD-10-CM | POA: Insufficient documentation

## 2021-06-22 DIAGNOSIS — Z794 Long term (current) use of insulin: Secondary | ICD-10-CM | POA: Diagnosis not present

## 2021-06-22 DIAGNOSIS — N183 Chronic kidney disease, stage 3 unspecified: Secondary | ICD-10-CM | POA: Diagnosis not present

## 2021-06-22 DIAGNOSIS — E1122 Type 2 diabetes mellitus with diabetic chronic kidney disease: Secondary | ICD-10-CM | POA: Diagnosis not present

## 2021-06-22 DIAGNOSIS — E785 Hyperlipidemia, unspecified: Secondary | ICD-10-CM | POA: Diagnosis not present

## 2021-06-22 HISTORY — PX: COLONOSCOPY: SHX5424

## 2021-06-22 LAB — GLUCOSE, CAPILLARY: Glucose-Capillary: 72 mg/dL (ref 70–99)

## 2021-06-22 SURGERY — COLONOSCOPY
Anesthesia: General

## 2021-06-22 MED ORDER — PROPOFOL 500 MG/50ML IV EMUL
INTRAVENOUS | Status: AC
  Filled 2021-06-22: qty 50

## 2021-06-22 MED ORDER — PHENYLEPHRINE HCL-NACL 20-0.9 MG/250ML-% IV SOLN
INTRAVENOUS | Status: AC
  Filled 2021-06-22: qty 250

## 2021-06-22 MED ORDER — GLYCOPYRROLATE 0.2 MG/ML IJ SOLN
INTRAMUSCULAR | Status: DC | PRN
  Administered 2021-06-22: .2 mg via INTRAVENOUS

## 2021-06-22 MED ORDER — PROPOFOL 10 MG/ML IV BOLUS
INTRAVENOUS | Status: DC | PRN
  Administered 2021-06-22: 30 mg via INTRAVENOUS
  Administered 2021-06-22: 100 mg via INTRAVENOUS

## 2021-06-22 MED ORDER — SPOT INK MARKER SYRINGE KIT
PACK | SUBMUCOSAL | Status: DC | PRN
  Administered 2021-06-22: 2 mL via SUBMUCOSAL

## 2021-06-22 MED ORDER — LIDOCAINE HCL (CARDIAC) PF 100 MG/5ML IV SOSY
PREFILLED_SYRINGE | INTRAVENOUS | Status: DC | PRN
  Administered 2021-06-22: 50 mg via INTRAVENOUS

## 2021-06-22 MED ORDER — SODIUM CHLORIDE 0.9 % IV SOLN: INTRAVENOUS | Status: DC

## 2021-06-22 MED ORDER — PROPOFOL 500 MG/50ML IV EMUL
INTRAVENOUS | Status: DC | PRN
  Administered 2021-06-22: 70 ug/kg/min via INTRAVENOUS

## 2021-06-22 NOTE — Transfer of Care (Signed)
Immediate Anesthesia Transfer of Care Note  Patient: Aaron [name]  Procedure(s) Performed: COLONOSCOPY  Patient Location: PACU and Endoscopy Unit  Anesthesia Type:MAC  Level of Consciousness: drowsy  Airway & Oxygen Therapy: Patient Spontanous Breathing  Post-op Assessment: Report given to RN and Post -op Vital signs reviewed and stable  Post vital signs: Reviewed and stable  Last Vitals:  Vitals Value Taken Time  BP 111/64 06/22/21 1002  Temp 35.4 C 06/22/21 1002  Pulse 67 06/22/21 1002  Resp 20 06/22/21 1005  SpO2 100 % 06/22/21 1002  Vitals shown include unvalidated device data.  Last Pain:  Vitals:   06/22/21 1002  TempSrc: Temporal  PainSc: 0-No pain         Complications: No notable events documented.

## 2021-06-22 NOTE — Anesthesia Preprocedure Evaluation (Signed)
Anesthesia Evaluation  Patient identified by MRN, date of birth, ID band Patient awake    Reviewed: Allergy & Precautions, H&P , NPO status , Patient's Chart, lab work & pertinent test results, reviewed documented beta blocker date and time   Airway Mallampati: III   Neck ROM: full    Dental  (+) Poor Dentition   Pulmonary sleep apnea and Continuous Positive Airway Pressure Ventilation ,    Pulmonary exam normal        Cardiovascular Exercise Tolerance: Good hypertension, On Medications negative cardio ROS Normal cardiovascular exam Rhythm:regular Rate:Normal     Neuro/Psych negative neurological ROS  negative psych ROS   GI/Hepatic negative GI ROS, Neg liver ROS,   Endo/Other  diabetes, Type 1, Oral Hypoglycemic AgentsMorbid obesity  Renal/GU Renal disease  negative genitourinary   Musculoskeletal   Abdominal   Peds  Hematology negative hematology ROS (+)   Anesthesia Other Findings Past Medical History: 2013: Cancer ([place])     Comment:  prostate (radiation and surgery) No date: Diabetes mellitus without complication ([place]) No date: Gout No date: Hyperlipidemia No date: Hypertension No date: Sleep apnea 06/03/2020: Spinal stenosis in cervical region Past Surgical History: No date: CIRCUMCISION 2015: CYST EXCISION; Left     Comment:  arm 2003: deviated septrum 2016: JOINT REPLACEMENT; Left     Comment:  Hip No date: PROSTATE SURGERY     Comment:  prostatectomy due to cancer 2013: PROSTATECTOMY 2004: TONSILLECTOMY AND ADENOIDECTOMY 2016: TOTAL HIP ARTHROPLASTY; Left 11/27/2017: TOTAL HIP ARTHROPLASTY; Right     Comment:  Procedure: TOTAL HIP ARTHROPLASTY ANTERIOR APPROACH;                Surgeon: Hessie Knows, MD;  Location: [facility] ORS;                Service: Orthopedics;  Laterality: Right; BMI    Body Mass Index: 40.62 kg/m     Reproductive/Obstetrics negative OB ROS                              Anesthesia Physical Anesthesia Plan  ASA: 3  Anesthesia Plan: General   Post-op Pain Management:    Induction:   PONV Risk Score and Plan:   Airway Management Planned:   Additional Equipment:   Intra-op Plan:   Post-operative Plan:   Informed Consent: I have reviewed the patients History and Physical, chart, labs and discussed the procedure including the risks, benefits and alternatives for the proposed anesthesia with the patient or authorized representative who has indicated his/her understanding and acceptance.     Dental Advisory Given  Plan Discussed with: CRNA  Anesthesia Plan Comments:         Anesthesia Quick Evaluation

## 2021-06-22 NOTE — Interval H&P Note (Signed)
History and Physical Interval Note:  06/22/2021 8:38 AM  Aaron [name]  has presented today for surgery, with the diagnosis of History of adenomatous polyp of colon (Z86.010).  The various methods of treatment have been discussed with the patient and family. After consideration of risks, benefits and other options for treatment, the patient has consented to  Procedure(s) with comments: COLONOSCOPY (N/A) - IDDM as a surgical intervention.  The patient's history has been reviewed, patient examined, no change in status, stable for surgery.  I have reviewed the patient's chart and labs.  Questions were answered to the patient's satisfaction.     [place], [place]

## 2021-06-22 NOTE — Op Note (Signed)
[facility] Gastroenterology Patient Name: Aaron [name] Procedure Date: 06/22/2021 8:45 AM MRN: [id number] Account #: [id number] Date of Birth: 24-Nov-1950 Admit Type: Outpatient Age: 71 Room: [facility] ENDO ROOM 2 Gender: Male Note Status: Finalized Instrument Name: Jasper Riling [id number] Procedure:             Colonoscopy Indications:           Surveillance: Personal history of adenomatous polyps                         on last colonoscopy > 5 years ago Providers:             Lorie Apley K. Alice Reichert MD, MD Referring MD:          Barbaraann Faster. Cannady (Referring MD) Medicines:             Propofol per Anesthesia Complications:         No immediate complications. Procedure:             Pre-Anesthesia Assessment:                        - The risks and benefits of the procedure and the                         sedation options and risks were discussed with the                         patient. All questions were answered and informed                         consent was obtained.                        - Patient identification and proposed procedure were                         verified prior to the procedure by the nurse. The                         procedure was verified in the procedure room.                        - ASA Grade Assessment: III - A patient with severe                         systemic disease.                        - After reviewing the risks and benefits, the patient                         was deemed in satisfactory condition to undergo the                         procedure.                        After obtaining informed consent, the colonoscope was  passed under direct vision. Throughout the procedure,                         the patient's blood pressure, pulse, and oxygen                         saturations were monitored continuously. The                         Colonoscope was introduced through the anus and                          advanced to the the cecum, identified by appendiceal                         orifice and ileocecal valve. The colonoscopy was                         technically difficult and complex due to significant                         looping. Successful completion of the procedure was                         aided by applying abdominal pressure. The patient                         tolerated the procedure well. The quality of the bowel                         preparation was adequate. The ileocecal valve,                         appendiceal orifice, and rectum were photographed. Findings:      The perianal and digital rectal examinations were normal.      Three carpet-like polyps were found in the cecum. The polyps were 4 to       18 mm in size. These polyps were removed with a piecemeal technique       using a hot snare. Resection and retrieval were complete. To prevent       bleeding after the polypectomy, two hemostatic clips were successfully       placed (MR conditional). There was no bleeding during, or at the end, of       the procedure. Area was successfully injected with 2 mL Spot (carbon       black) for tattooing.      Two sessile polyps were found in the ascending colon. The polyps were 5       to 8 mm in size. These polyps were removed with a hot snare. Resection       and retrieval were complete.      A 10 mm polyp was found in the rectum. The polyp was semi-pedunculated.       The polyp was removed with a hot snare. Resection and retrieval were       complete.      Multiple sessile polyps were found in the rectum. The polyps were       diminutive in size. Coagulation for tissue destruction using snare was  successful.      Non-bleeding internal hemorrhoids were found during retroflexion. The       hemorrhoids were Grade I (internal hemorrhoids that do not prolapse).      A few small-mouthed diverticula were found in the sigmoid colon.      The exam was otherwise without  abnormality. Impression:            - Three 4 to 18 mm polyps in the cecum, removed                         piecemeal using a hot snare. Resected and retrieved.                         Clips (MR conditional) were placed. Injected.                        - Two 5 to 8 mm polyps in the ascending colon, removed                         with a hot snare. Resected and retrieved.                        - One 10 mm polyp in the rectum, removed with a hot                         snare. Resected and retrieved.                        - Multiple diminutive polyps in the rectum. Treated                         with a hot snare.                        - Non-bleeding internal hemorrhoids.                        - Diverticulosis in the sigmoid colon.                        - The examination was otherwise normal. Recommendation:        - Patient has a contact number available for                         emergencies. The signs and symptoms of potential                         delayed complications were discussed with the patient.                         Return to normal activities tomorrow. Written                         discharge instructions were provided to the patient.                        - Resume previous diet.                        -  Continue present medications.                        - Repeat colonoscopy in 1 year for surveillance.                        - The findings and recommendations were discussed with                         the patient. Procedure Code(s):     --- Professional ---                        [phone], Colonoscopy, flexible; with ablation of                         tumor(s), polyp(s), or other lesion(s) (includes pre-                         and post-dilation and guide wire passage, when                         performed)                        45385, 59, Colonoscopy, flexible; with removal of                         tumor(s), polyp(s), or other lesion(s) by snare                          technique                        45381, Colonoscopy, flexible; with directed submucosal                         injection(s), any substance Diagnosis Code(s):     --- Professional ---                        K57.30, Diverticulosis of large intestine without                         perforation or abscess without bleeding                        K64.0, First degree hemorrhoids                        K62.1, Rectal polyp                        K63.5, Polyp of colon                        Z86.010, Personal history of colonic polyps CPT copyright 2019 American Medical Association. All rights reserved. The codes documented in this report are preliminary and upon coder review may  be revised to meet current compliance requirements. Efrain Sella MD, MD 06/22/2021 10:10:09 AM This report has been signed electronically. Number of Addenda: 0 Note Initiated On: 06/22/2021 8:45 AM Scope Withdrawal Time: 0 hours 50 minutes 17 seconds  Total  Procedure Duration: 0 hours 58 minutes 36 seconds  Estimated Blood Loss:  Estimated blood loss: none. Estimated blood loss: none.      [facility]

## 2021-06-22 NOTE — H&P (Signed)
Outpatient short stay form Pre-procedure 06/22/2021 8:37 AM Leemon Ayala K. Alice Reichert, M.D.  Primary Physician: Marnee Guarneri, NP  Reason for visit:  Personal history of adenomatous colon polyps - 2011   History of present illness:  71 y/o AA male with a PMH of HTN, HLD, T2DM, CKD Stage III, morbid obesity, OSA, chronic pain syndrome, Hx of prostate cancer, aortic atherosclerosis, and gout presents to the [facility] for chief complaint of high-risk colon cancer surveillance 2/2 personal hx of adenomatous polyp of colon  1. Hx of adenomatous polyp of colon 2. Left-sided colonic diverticulosis  The patient understands the nature of the planned procedure, indications, risks, alternatives and potential complications including but not limited to bleeding, infection, perforation, damage to internal organs and possible oversedation/side effects from anesthesia. The patient agrees and gives consent to proceed.  Please refer to procedure notes for findings, recommendations and patient disposition/instructions. Patient denies change in bowel habits, rectal bleeding, weight loss or abdominal pain.    Current Facility-Administered Medications:    0.9 %  sodium chloride infusion, , Intravenous, Continuous, Mat Stuard, Benay Pike, MD  Medications Prior to Admission  Medication Sig Dispense Refill Last Dose   allopurinol (ZYLOPRIM) 300 MG tablet TAKE 1 TABLET EVERY DAY 90 tablet 3 06/21/2021   amLODipine (NORVASC) 10 MG tablet TAKE 1 TABLET EVERY DAY 90 tablet 1 06/21/2021   ASPIRIN 81 PO every evening.    Past Month   carvedilol (COREG) 25 MG tablet TAKE 1 TABLET TWICE DAILY WITH A MEAL 180 tablet 4 06/22/2021 at 0600   gabapentin (NEURONTIN) 600 MG tablet Take 1-1.5 tablets (600-900 mg total) by mouth at bedtime. 135 tablet 4 06/21/2021   hydrochlorothiazide (HYDRODIURIL) 25 MG tablet TAKE 1 TABLET EVERY DAY 90 tablet 1 06/22/2021 at 0600   losartan (COZAAR) 100 MG tablet TAKE 1 TABLET EVERY DAY 90 tablet 1  06/21/2021   pantoprazole (PROTONIX) 20 MG tablet TAKE 1 TABLET(20 MG) BY MOUTH EVERY DAY   06/21/2021   tamsulosin (FLOMAX) 0.4 MG CAPS capsule Take 1 capsule (0.4 mg total) by mouth daily. 90 capsule 4 06/21/2021   ACCU-CHEK GUIDE test strip       atorvastatin (LIPITOR) 20 MG tablet TAKE 1 TABLET EVERY DAY 90 tablet 3    insulin aspart (NOVOLOG) 100 UNIT/ML injection Inject into the skin. Using via insulin pump  Medtronic MiniMed 630G insulin pump      Insulin Human (INSULIN PUMP) SOLN Inject into the skin.      insulin lispro (HUMALOG KWIKPEN) 200 UNIT/ML KwikPen Inject into the skin.      [START ON 07/06/2021] Zoster Vaccine Adjuvanted [facility]) injection Inject 0.5 mLs into the muscle once for 1 dose. Dose # 2 0.5 mL 0      Allergies  Allergen Reactions   Glucophage [Metformin] Diarrhea     Past Medical History:  Diagnosis Date   Cancer ([place]) 2013   prostate (radiation and surgery)   Diabetes mellitus without complication ([place])    Gout    Hyperlipidemia    Hypertension    Sleep apnea    Spinal stenosis in cervical region 06/03/2020    Review of systems:  Otherwise negative.    Physical Exam  Gen: Alert, oriented. Appears stated age.  HEENT: [place]/AT. PERRLA. Lungs: CTA, no wheezes. CV: RR nl S1, S2. Abd: soft, benign, no masses. BS+ Ext: No edema. Pulses 2+    Planned procedures: Proceed with colonoscopy. The patient understands the nature of the planned procedure, indications, risks,  alternatives and potential complications including but not limited to bleeding, infection, perforation, damage to internal organs and possible oversedation/side effects from anesthesia. The patient agrees and gives consent to proceed.  Please refer to procedure notes for findings, recommendations and patient disposition/instructions.     Saad Buhl K. Alice Reichert, M.D. Gastroenterology 06/22/2021  8:37 AM

## 2021-06-23 LAB — SURGICAL PATHOLOGY

## 2021-06-23 NOTE — Anesthesia Postprocedure Evaluation (Signed)
Anesthesia Post Note  Patient: Aaron [name]  Procedure(s) Performed: COLONOSCOPY  Patient location during evaluation: PACU Anesthesia Type: General Level of consciousness: awake and alert Pain management: pain level controlled Vital Signs Assessment: post-procedure vital signs reviewed and stable Respiratory status: spontaneous breathing, nonlabored ventilation, respiratory function stable and patient connected to nasal cannula oxygen Cardiovascular status: blood pressure returned to baseline and stable Postop Assessment: no apparent nausea or vomiting Anesthetic complications: no   No notable events documented.   Last Vitals:  Vitals:   06/22/21 1002 06/22/21 1012  BP: 111/64 124/78  Pulse: 67   Resp: 14   Temp: (!) 35.4 C   SpO2: 100%     Last Pain:  Vitals:   06/23/21 0736  TempSrc:   PainSc: 0-No pain                 Molli Barrows

## 2021-06-27 ENCOUNTER — Ambulatory Visit (INDEPENDENT_AMBULATORY_CARE_PROVIDER_SITE_OTHER): Payer: Medicare HMO

## 2021-06-27 DIAGNOSIS — E1159 Type 2 diabetes mellitus with other circulatory complications: Secondary | ICD-10-CM

## 2021-06-27 DIAGNOSIS — E1169 Type 2 diabetes mellitus with other specified complication: Secondary | ICD-10-CM

## 2021-06-27 DIAGNOSIS — E113293 Type 2 diabetes mellitus with mild nonproliferative diabetic retinopathy without macular edema, bilateral: Secondary | ICD-10-CM

## 2021-06-27 DIAGNOSIS — E785 Hyperlipidemia, unspecified: Secondary | ICD-10-CM

## 2021-06-27 NOTE — Progress Notes (Signed)
Chronic Care Management Pharmacy Note  06/27/2021 Name:  Aaron [name] MRN:  [id number] DOB:  Apr 09, 1951  Summary: --Could consider dose increase to high intensity stating atorvastatin 24m->40mg, LDL <55, 50% reduction not achieved (LDL 78 in 2017 - highest that I could find); PCP f/u scheduled 08/04/21.   Subjective: Aaron HARTSis an 71y.o. year old male who is a primary patient of Cannady, JBarbaraann Faster NP.  The CCM team was consulted for assistance with disease management and care coordination needs.    Engaged with patient by telephone for follow up visit in response to provider referral for pharmacy case management and/or care coordination services.   Consent to Services:  The patient was given information about Chronic Care Management services, agreed to services, and gave verbal consent prior to initiation of services.  Please see initial visit note for detailed documentation.   Patient Care Team: CVenita Lick NP as PCP - General (Nurse Practitioner) SJudi Cong MD as Consulting Physician (Endocrinology) [facility] as Pharmacist (Pharmacist)  Objective:  Lab Results  Component Value Date   CREATININE 1.24 02/04/2021   CREATININE 1.53 (H) 11/04/2020   CREATININE 1.27 08/04/2020   Lab Results  Component Value Date   HGBA1C 6.8 (H) 02/04/2021   Last diabetic Eye exam:  Lab Results  Component Value Date/Time   HMDIABEYEEXA Retinopathy (A) 02/28/2021 12:00 AM    Last diabetic Foot exam: No results found for: HMDIABFOOTEX      Component Value Date/Time   CHOL 99 (L) 02/04/2021 1010   CHOL 78 03/21/2018 1103   TRIG 82 02/04/2021 1010   TRIG 67 03/21/2018 1103   HDL 31 (L) 02/04/2021 1010   CHOLHDL 3.1 08/15/2017 0858   VLDL 13 03/21/2018 1103   LDLCALC 51 02/04/2021 1010   Hepatic Function Latest Ref Rng & Units 02/04/2021 11/04/2020 08/04/2020  Total Protein 6.0 - 8.5 g/dL 6.9 7.1 7.2  Albumin 3.8 - 4.8 g/dL 4.2 4.4 4.2  AST 0  - 40 IU/L 13 17 19   ALT 0 - 44 IU/L 10 12 17   Alk Phosphatase 44 - 121 IU/L 151(H) 169(H) 145(H)  Total Bilirubin 0.0 - 1.2 mg/dL 0.4 0.3 0.5   Lab Results  Component Value Date/Time   TSH 3.670 11/04/2020 09:07 AM   TSH 2.610 08/04/2020 09:15 AM    CBC Latest Ref Rng & Units 09/08/2019 11/30/2017 11/29/2017  WBC 3.4 - 10.8 x10E3/uL 9.5 13.1(H) 15.0(H)  Hemoglobin 13.0 - 17.7 g/dL 13.5 10.2(L) 11.2(L)  Hematocrit 37.5 - 51.0 % 40.8 30.3(L) 33.4(L)  Platelets 150 - 450 x10E3/uL 285 185 199   No results found for: VD25OH  Clinical ASCVD:  The ASCVD Risk score (Arnett DK, et al., 2019) failed to calculate for the following reasons:   The valid total cholesterol range is 130 to 320 mg/dL    Social History   Tobacco Use  Smoking Status Never  Smokeless Tobacco Never   BP Readings from Last 3 Encounters:  06/22/21 124/78  05/06/21 133/75  02/04/21 120/73   Pulse Readings from Last 3 Encounters:  06/22/21 67  05/06/21 66  02/04/21 62   Wt Readings from Last 3 Encounters:  06/22/21 (!) 325 lb (147.4 kg)  05/06/21 (!) 324 lb 6.4 oz (147.1 kg)  02/04/21 (!) 329 lb 3.2 oz (149.3 kg)    Assessment: Review of patient past medical history, allergies, medications, health status, including review of consultants reports, laboratory and other test data,  was performed as part of comprehensive evaluation and provision of chronic care management services.   SDOH:  (Social Determinants of Health) assessments and interventions performed: Yes.   CCM Care Plan  Allergies  Allergen Reactions   Glucophage [Metformin] Diarrhea    Medications Reviewed Today     Reviewed by Madelin Rear, [facility] (Pharmacist) on 06/27/21 at [phone]  Med List Status: <None>   Medication Order Taking? Sig Documenting Provider Last Dose Status Informant  ACCU-CHEK GUIDE test strip [id number]   [provider]  Active   allopurinol (ZYLOPRIM) 300 MG tablet [id number]  TAKE 1 TABLET EVERY DAY Cannady, Jolene  T, NP  Active   amLODipine (NORVASC) 10 MG tablet [id number]  TAKE 1 TABLET EVERY DAY Cannady, Jolene T, NP  Active   ASPIRIN 81 PO [id number]  every evening.  [provider]  Active   atorvastatin (LIPITOR) 20 MG tablet [id number]  TAKE 1 TABLET EVERY DAY Cannady, Jolene T, NP  Active   carvedilol (COREG) 25 MG tablet [id number]  TAKE 1 TABLET TWICE DAILY WITH A MEAL Cannady, Jolene T, NP  Active   gabapentin (NEURONTIN) 600 MG tablet [id number]  Take 1-1.5 tablets (600-900 mg total) by mouth at bedtime. Marnee Guarneri T, NP  Active   hydrochlorothiazide (HYDRODIURIL) 25 MG tablet [id number]  TAKE 1 TABLET EVERY DAY Cannady, Jolene T, NP  Active   insulin aspart (NOVOLOG) 100 UNIT/ML injection [id number]  Inject into the skin. Using via insulin pump  Medtronic MiniMed 630G insulin pump [provider]  Active   Insulin Human (INSULIN PUMP) SOLN [id number]  Inject into the skin. [provider]  Active   insulin lispro (HUMALOG KWIKPEN) 200 UNIT/ML KwikPen [id number]  Inject into the skin. [provider]  Active   losartan (COZAAR) 100 MG tablet [id number]  TAKE 1 TABLET EVERY DAY Cannady, Jolene T, NP  Active   pantoprazole (PROTONIX) 20 MG tablet [id number]  TAKE 1 TABLET(20 MG) BY MOUTH EVERY DAY [provider]  Active   tamsulosin (FLOMAX) 0.4 MG CAPS capsule [id number]  Take 1 capsule (0.4 mg total) by mouth daily. Marnee Guarneri T, NP  Active   Zoster Vaccine Adjuvanted [facility]) injection [id number]  Inject 0.5 mLs into the muscle once for 1 dose. Dose # 2 Venita Lick, NP  Active             Patient Active Problem List   Diagnosis Date Noted   Cervical spondylosis 11/09/2020   CKD stage 3 due to type 2 diabetes mellitus ([place]) 11/05/2020   Chronic pain syndrome 06/03/2020   Type 2 diabetes mellitus with both eyes affected by mild nonproliferative retinopathy without macular edema, with long-term current use of insulin ([place]) 05/01/2020    Aortic atherosclerosis ([place]) 05/01/2020   Type 2 diabetes mellitus with proteinuria ([place]) 01/18/2018   Morbid obesity ([place]) 01/18/2018   Presence of insulin pump 01/18/2018   Status post total hip replacement, right 11/27/2017   Advanced care planning/counseling discussion 08/15/2017   Spinal stenosis, cervical region 02/15/2017   Sleep apnea 04/26/2015   Gout 04/26/2015   History of prostate cancer 04/26/2015   BMI 40.0-44.9, adult ([place]) 04/26/2015   Hyperlipidemia associated with type 2 diabetes mellitus ([place])    Hypertension associated with diabetes ([place])     Immunization History  Administered Date(s) Administered   Fluad Quad(high Dose 65+) 03/28/2019, 02/03/2020, 02/04/2021   Influenza, High Dose Seasonal PF 03/29/2018   Influenza,inj,Quad PF,6+ Mos 07/11/2016, 02/15/2017   Influenza-Unspecified 03/26/2015  PFIZER(Purple Top)SARS-COV-2 Vaccination 08/05/2019, 08/26/2019, 02/25/2020   Pneumococcal Conjugate-13 03/21/2018   Pneumococcal Polysaccharide-23 07/02/2019   Pneumococcal-Unspecified 10/20/1992, 02/20/1999   Tdap 05/31/2011   Zoster, Live 07/11/2016    Conditions to be addressed/monitored: HLD HTN DM Obesity Gout Sleep Apnea  Care Plan : [place]  Updates made by Madelin Rear, [facility] since 06/27/2021 12:00 AM     Problem: CAD, DM2, HTN, HLD, Chronic pain 2/2 cervical spinal stenosis, gout, morbid obesity, OSA, h/o prostate cancer   Priority: High     Long-Range Goal: Disease Management   Start Date: 06/27/2021  Expected End Date: 06/27/2022  Recent Progress: On track  Priority: High  Note:    Hypertension (BP goal <130/80) -Controlled -Current treatment: Coreg - Appropriate, Effective, Safe, Accessible Amlodipine - Appropriate, Effective, Safe, Accessible HCTZ - Appropriate, Effective, Safe, Accessible Losartan - Appropriate, Effective, Safe, Accessible -Medications previously tried: n/a  -Current home readings: hasnt checked recently reports  740C systolic when getting colonscopy recently  -Current dietary habits: biggest thing has been trying to cut down on carbs, especially bread intake.  -Denies hypotensive/hypertensive symptoms -Educated on BP goals and benefits of medications for prevention of heart attack, stroke and kidney damage; -Counseled to monitor BP at home 1-2x/wk, document, and provide log at future appointments -Counseled on diet and exercise extensively Recommended to continue current medication  Hyperlipidemia: (LDL goal < 55) -Controlled -Highest LDL on record 78  -Current treatment: Atorvastatin 20 mg once daily  -Reviewed tolerability, side effects - no issues noted.  -Current dietary patterns: see HTN -Current exercise habits: n/a -Educated on Cholesterol goals;  -Counseled on diet and exercise extensively Recommended to continue current medication Could consider dose increase to high intensity stating atorvastatin 59m->40mg, LDL <55, 50% reduction not achieved (LDL 78 in 2017 - highest that I could find)  Diabetes (A1c goal <7%) -Controlled -Sees endo.  -Current medications: Humalog Kwikpen -Current home glucose readings fasting glucose: 80s-130s post prandial glucose: none provided. Is set up with Dexcom G6 CGM.  -Denies hypoglycemic/hyperglycemic symptoms -Current meal patterns: ongoing effort to cut down on carb rich foods, especially white bread (4 slices per day currently) -Current exercise: did not review -Educated on A1c and blood sugar goals; Prevention and management of hypoglycemic episodes; Benefits of routine self-monitoring of blood sugar; -Counseled to check feet daily and get yearly eye exams -Counseled on diet and exercise extensively Recommended to continue current medication Assessed patient finances. Denies any issues.  Medication Assistance: None required.  Patient affirms current coverage meets needs.     [facility]  [facility]  [id number]Phone: [phone]: [phone] [id number] [place], [place] [address]one: [phone]: [phone] - WeHeb[address] [place]ates[place]hone: [phone]: [phone] endorses 100% compliance  Follow Up:  Patient agrees to Care Plan and Follow-up.  Plan:  34m7ml visit w/ pharmacist.  Future Appointments  Date Time Provider DepMel[place] Park/05/2021 10:15 AM StoAbbie SonsD BUA-BUA None  08/04/2021  8:40 AM CanVenita LickP CFP-CFP PEC  01/02/2022  9:00 AM CFP CCM PHARMACY CFP-CFP PEC   JacMadelin RearharmD, BCGSolar Surgical Center LLCinical Pharmacist  CriUniversity Of Texas [phone]

## 2021-06-27 NOTE — Patient Instructions (Signed)
Mr. Mierzejewski,  Thank you for talking with me today. I have included our care plan/goals in the following pages.   Please review and call me at [phone] with any questions.  Thanks! Ellin Mayhew, PharmD Clinical Pharmacist  [phone]  Care Plan : [place]  Updates made by Madelin Rear, [facility] since 06/27/2021 12:00 AM     Problem: CAD, DM2, HTN, HLD, Chronic pain 2/2 cervical spinal stenosis, gout, morbid obesity, OSA, h/o prostate cancer   Priority: High     Long-Range Goal: Disease Management   Start Date: 06/27/2021  Expected End Date: 06/27/2022  Recent Progress: On track  Priority: High  Note:    Hypertension (BP goal <130/80) -Controlled -Current treatment: Coreg - Appropriate, Effective, Safe, Accessible Amlodipine - Appropriate, Effective, Safe, Accessible HCTZ - Appropriate, Effective, Safe, Accessible Losartan - Appropriate, Effective, Safe, Accessible -Medications previously tried: n/a  -Current home readings: hasnt checked recently reports 710G systolic when getting colonscopy recently  -Current dietary habits: biggest thing has been trying to cut down on carbs, especially bread intake.  -Denies hypotensive/hypertensive symptoms -Educated on BP goals and benefits of medications for prevention of heart attack, stroke and kidney damage; -Counseled to monitor BP at home 1-2x/wk, document, and provide log at future appointments -Counseled on diet and exercise extensively Recommended to continue current medication  Hyperlipidemia: (LDL goal < 55) -Controlled -Highest LDL on record 78  -Current treatment: Atorvastatin 20 mg once daily  -Reviewed tolerability, side effects - no issues noted.  -Current dietary patterns: see HTN -Current exercise habits: n/a -Educated on Cholesterol goals;  -Counseled on diet and exercise extensively Recommended to continue current medication Could consider dose increase to high intensity stating  atorvastatin 20mg ->40mg , LDL <55, 50% reduction not achieved (LDL 78 in 2017 - highest that I could find)  Diabetes (A1c goal <7%) -Controlled -Sees endo.  -Current medications: Humalog Kwikpen -Current home glucose readings fasting glucose: 80s-130s post prandial glucose: none provided. Is set up with Dexcom G6 CGM.  -Denies hypoglycemic/hyperglycemic symptoms -Current meal patterns: ongoing effort to cut down on carb rich foods, especially white bread (4 slices per day currently) -Current exercise: did not review -Educated on A1c and blood sugar goals; Prevention and management of hypoglycemic episodes; Benefits of routine self-monitoring of blood sugar; -Counseled to check feet daily and get yearly eye exams -Counseled on diet and exercise extensively Recommended to continue current medication Assessed patient finances. Denies any issues.  Medication Assistance: None required.  Patient affirms current coverage meets needs.     The patient verbalized understanding of instructions provided today and agreed to receive a MyChart copy of patient instruction and/or educational materials. Telephone follow up appointment with pharmacy team member scheduled for: See next appointment with "Care Management Staff" under "What's Next" below.    Diabetes Mellitus and Nutrition, Adult When you have diabetes, or diabetes mellitus, it is very important to have healthy eating habits because your blood sugar (glucose) levels are greatly affected by what you eat and drink. Eating healthy foods in the right amounts, at about the same times every day, can help you: Manage your blood glucose. Lower your risk of heart disease. Improve your blood pressure. Reach or maintain a healthy weight. What can affect my meal plan? Every person with diabetes is different, and each person has different needs for a meal plan. Your health care provider may recommend that you work with a dietitian to make a meal plan  that is  best for you. Your meal plan may vary depending on factors such as: The calories you need. The medicines you take. Your weight. Your blood glucose, blood pressure, and cholesterol levels. Your activity level. Other health conditions you have, such as heart or kidney disease. How do carbohydrates affect me? Carbohydrates, also called carbs, affect your blood glucose level more than any other type of food. Eating carbs raises the amount of glucose in your blood. It is important to know how many carbs you can safely have in each meal. This is different for every person. Your dietitian can help you calculate how many carbs you should have at each meal and for each snack. How does alcohol affect me? Alcohol can cause a decrease in blood glucose (hypoglycemia), especially if you use insulin or take certain diabetes medicines by mouth. Hypoglycemia can be a life-threatening condition. Symptoms of hypoglycemia, such as sleepiness, dizziness, and confusion, are similar to symptoms of having too much alcohol. Do not drink alcohol if: Your health care provider tells you not to drink. You are pregnant, may be pregnant, or are planning to become pregnant. If you drink alcohol: Limit how much you have to: 0-1 drink a day for women. 0-2 drinks a day for men. Know how much alcohol is in your drink. In the [place] one drink equals one 12 oz bottle of beer (355 mL), one 5 oz glass of wine (148 mL), or one 1 oz glass of hard liquor (44 mL). Keep yourself hydrated with water, diet soda, or unsweetened iced tea. Keep in mind that regular soda, juice, and other mixers may contain a lot of sugar and must be counted as carbs. What are tips for following this plan? Reading food labels Start by checking the serving size on the Nutrition Facts label of packaged foods and drinks. The number of calories and the amount of carbs, fats, and other nutrients listed on the label are based on one serving of the item. Many  items contain more than one serving per package. Check the total grams (g) of carbs in one serving. Check the number of grams of saturated fats and trans fats in one serving. Choose foods that have a low amount or none of these fats. Check the number of milligrams (mg) of salt (sodium) in one serving. Most people should limit total sodium intake to less than 2,300 mg per day. Always check the nutrition information of foods labeled as "low-fat" or "nonfat." These foods may be higher in added sugar or refined carbs and should be avoided. Talk to your dietitian to identify your daily goals for nutrients listed on the label. Shopping Avoid buying canned, pre-made, or processed foods. These foods tend to be high in fat, sodium, and added sugar. Shop around the outside edge of the grocery store. This is where you will most often find fresh fruits and vegetables, bulk grains, fresh meats, and fresh dairy products. Cooking Use low-heat cooking methods, such as baking, instead of high-heat cooking methods, such as deep frying. Cook using healthy oils, such as olive, canola, or sunflower oil. Avoid cooking with butter, cream, or high-fat meats. Meal planning Eat meals and snacks regularly, preferably at the same times every day. Avoid going long periods of time without eating. Eat foods that are high in fiber, such as fresh fruits, vegetables, beans, and whole grains. Eat 4-6 oz (112-168 g) of lean protein each day, such as lean meat, chicken, fish, eggs, or tofu. One ounce (oz) (28 g) of  lean protein is equal to: 1 oz (28 g) of meat, chicken, or fish. 1 egg.  cup (62 g) of tofu. Eat some foods each day that contain healthy fats, such as avocado, nuts, seeds, and fish. What foods should I eat? Fruits Berries. Apples. Oranges. Peaches. Apricots. Plums. Grapes. Mangoes. Papayas. Pomegranates. Kiwi. Cherries. Vegetables Leafy greens, including lettuce, spinach, kale, chard, collard greens, mustard  greens, and cabbage. Beets. Cauliflower. Broccoli. Carrots. Green beans. Tomatoes. Peppers. Onions. Cucumbers. Brussels sprouts. Grains Whole grains, such as whole-wheat or whole-grain bread, crackers, tortillas, cereal, and pasta. Unsweetened oatmeal. Quinoa. Brown or wild rice. Meats and other proteins Seafood. Poultry without skin. Lean cuts of poultry and beef. Tofu. Nuts. Seeds. Dairy Low-fat or fat-free dairy products such as milk, yogurt, and cheese. The items listed above may not be a complete list of foods and beverages you can eat and drink. Contact a dietitian for more information. What foods should I avoid? Fruits Fruits canned with syrup. Vegetables Canned vegetables. Frozen vegetables with butter or cream sauce. Grains Refined white flour and flour products such as bread, pasta, snack foods, and cereals. Avoid all processed foods. Meats and other proteins Fatty cuts of meat. Poultry with skin. Breaded or fried meats. Processed meat. Avoid saturated fats. Dairy Full-fat yogurt, cheese, or milk. Beverages Sweetened drinks, such as soda or iced tea. The items listed above may not be a complete list of foods and beverages you should avoid. Contact a dietitian for more information. Questions to ask a health care provider Do I need to meet with a certified diabetes care and education specialist? Do I need to meet with a dietitian? What number can I call if I have questions? When are the best times to check my blood glucose? Where to find more information: American Diabetes Association: diabetes.org Academy of Nutrition and Dietetics: eatright.[employer] of Diabetes and Digestive and Kidney Diseases: AmenCredit.is Association of Diabetes Care & Education Specialists: diabeteseducator.org Summary It is important to have healthy eating habits because your blood sugar (glucose) levels are greatly affected by what you eat and drink. It is important to use alcohol  carefully. A healthy meal plan will help you manage your blood glucose and lower your risk of heart disease. Your health care provider may recommend that you work with a dietitian to make a meal plan that is best for you. This information is not intended to replace advice given to you by your health care provider. Make sure you discuss any questions you have with your health care provider. Document Revised: 12/10/2019 Document Reviewed: 12/10/2019 Elsevier Patient Education  [place].

## 2021-07-18 ENCOUNTER — Ambulatory Visit: Payer: Self-pay | Admitting: Urology

## 2021-07-19 DIAGNOSIS — I152 Hypertension secondary to endocrine disorders: Secondary | ICD-10-CM | POA: Diagnosis not present

## 2021-07-19 DIAGNOSIS — E1169 Type 2 diabetes mellitus with other specified complication: Secondary | ICD-10-CM | POA: Diagnosis not present

## 2021-07-19 DIAGNOSIS — E785 Hyperlipidemia, unspecified: Secondary | ICD-10-CM

## 2021-07-19 DIAGNOSIS — E1159 Type 2 diabetes mellitus with other circulatory complications: Secondary | ICD-10-CM | POA: Diagnosis not present

## 2021-07-19 DIAGNOSIS — E113293 Type 2 diabetes mellitus with mild nonproliferative diabetic retinopathy without macular edema, bilateral: Secondary | ICD-10-CM

## 2021-07-19 DIAGNOSIS — Z794 Long term (current) use of insulin: Secondary | ICD-10-CM | POA: Diagnosis not present

## 2021-07-20 ENCOUNTER — Encounter: Payer: Self-pay | Admitting: Urology

## 2021-07-20 ENCOUNTER — Other Ambulatory Visit: Payer: Self-pay

## 2021-07-20 ENCOUNTER — Ambulatory Visit: Payer: Medicare HMO | Admitting: Urology

## 2021-07-20 VITALS — BP 131/77 | HR 79 | Ht 75.0 in | Wt 325.0 lb

## 2021-07-20 DIAGNOSIS — Z87898 Personal history of other specified conditions: Secondary | ICD-10-CM

## 2021-07-20 DIAGNOSIS — Z8546 Personal history of malignant neoplasm of prostate: Secondary | ICD-10-CM | POA: Diagnosis not present

## 2021-07-20 DIAGNOSIS — R310 Gross hematuria: Secondary | ICD-10-CM | POA: Diagnosis not present

## 2021-07-20 DIAGNOSIS — N3040 Irradiation cystitis without hematuria: Secondary | ICD-10-CM | POA: Diagnosis not present

## 2021-07-20 LAB — MICROSCOPIC EXAMINATION

## 2021-07-20 LAB — URINALYSIS, COMPLETE
Bilirubin, UA: NEGATIVE
Glucose, UA: NEGATIVE
Leukocytes,UA: NEGATIVE
Nitrite, UA: NEGATIVE
Protein,UA: NEGATIVE
Specific Gravity, UA: 1.025 (ref 1.005–1.030)
Urobilinogen, Ur: 1 mg/dL (ref 0.2–1.0)
pH, UA: 6 (ref 5.0–7.5)

## 2021-07-20 NOTE — Progress Notes (Signed)
? ?07/20/2021 ?10:17 AM  ? ?Aaron [name] ?[birth year] ?[id number] ? ?Referring provider: Venita Lick, NP ?[address] [place],  [place] [address] ? ?Chief Complaint  ?Patient presents with  ? Hematuria  ? ? ?HPI: ?71 y.o. male presents for annual follow-up. ? ?Refer to my previous note 12/29/2019 for a urologic history summary ?Cystoscopy August 2021 felt consistent with radiation cystitis ?Has no complaints today and denies recurrent gross hematuria ?PSA 10/2020 <0.1 ? ? ?PMH: ?Past Medical History:  ?Diagnosis Date  ? Cancer [facility]) 2013  ? prostate (radiation and surgery)  ? Diabetes mellitus without complication ([place])   ? Gout   ? Hyperlipidemia   ? Hypertension   ? Sleep apnea   ? Spinal stenosis in cervical region 06/03/2020  ? ? ?Surgical History: ?Past Surgical History:  ?Procedure Laterality Date  ? CIRCUMCISION    ? COLONOSCOPY N/A 06/22/2021  ? Procedure: COLONOSCOPY;  Surgeon: Toledo, Benay Pike, MD;  Location: [facility] ENDOSCOPY;  Service: Gastroenterology;  Laterality: N/A;  IDDM  ? CYST EXCISION Left 2015  ? arm  ? deviated septrum  2003  ? JOINT REPLACEMENT Left 2016  ? Hip  ? PROSTATE SURGERY    ? prostatectomy due to cancer  ? PROSTATECTOMY  2013  ? TONSILLECTOMY AND ADENOIDECTOMY  2004  ? TOTAL HIP ARTHROPLASTY Left 2016  ? TOTAL HIP ARTHROPLASTY Right 11/27/2017  ? Procedure: TOTAL HIP ARTHROPLASTY ANTERIOR APPROACH;  Surgeon: Hessie Knows, MD;  Location: [facility] ORS;  Service: Orthopedics;  Laterality: Right;  ? ? ?Home Medications:  ?Allergies as of 07/20/2021   ? ?   Reactions  ? Glucophage [metformin] Diarrhea  ? ?  ? ?  ?Medication List  ?  ? ?  ? Accurate as of July 20, 2021 10:17 AM. If you have any questions, ask your nurse or doctor.  ?  ?  ? ?  ? ?Accu-Chek Guide test strip ?Generic drug: glucose blood ?  ?allopurinol 300 MG tablet ?Commonly known as: ZYLOPRIM ?TAKE 1 TABLET EVERY DAY ?  ?amLODipine 10 MG tablet ?Commonly known as: NORVASC ?TAKE 1 TABLET EVERY DAY ?  ?ASPIRIN 81 PO ?every evening. ?   ?atorvastatin 20 MG tablet ?Commonly known as: LIPITOR ?TAKE 1 TABLET EVERY DAY ?  ?carvedilol 25 MG tablet ?Commonly known as: COREG ?TAKE 1 TABLET TWICE DAILY WITH A MEAL ?  ?gabapentin 600 MG tablet ?Commonly known as: Neurontin ?Take 1-1.5 tablets (600-900 mg total) by mouth at bedtime. ?  ?HumaLOG KwikPen 200 UNIT/ML KwikPen ?Generic drug: insulin lispro ?Inject into the skin. ?  ?hydrochlorothiazide 25 MG tablet ?Commonly known as: HYDRODIURIL ?TAKE 1 TABLET EVERY DAY ?  ?insulin aspart 100 UNIT/ML injection ?Commonly known as: novoLOG ?Inject into the skin. Using via insulin pump  Medtronic MiniMed 630G insulin pump ?  ?insulin pump Soln ?Inject into the skin. ?  ?losartan 100 MG tablet ?Commonly known as: COZAAR ?TAKE 1 TABLET EVERY DAY ?  ?pantoprazole 20 MG tablet ?Commonly known as: PROTONIX ?TAKE 1 TABLET(20 MG) BY MOUTH EVERY DAY ?  ?tamsulosin 0.4 MG Caps capsule ?Commonly known as: FLOMAX ?Take 1 capsule (0.4 mg total) by mouth daily. ?  ? ?  ? ? ?Allergies:  ?Allergies  ?Allergen Reactions  ? Glucophage [Metformin] Diarrhea  ? ? ?Family History: ?Family History  ?Problem Relation Age of Onset  ? Cancer Mother   ? Breast cancer Mother   ? Stroke Father   ? Cancer Sister   ? Cancer Maternal Uncle   ?  Stroke Maternal Uncle   ? ? ?Social History:  reports that he has never smoked. He has never used smokeless tobacco. He reports that he does not drink alcohol and does not use drugs. ? ? ?Physical Exam: ?BP 131/77   Pulse 79   Ht 6\' 3"  (1.905 m)   Wt (!) 325 lb (147.4 kg)   BMI 40.62 kg/m?   ?Constitutional:  Alert and oriented, No acute distress. ?HEENT: [place] AT, moist mucus membranes.  Trachea midline, no masses. ?Cardiovascular: No clubbing, cyanosis, or edema. ?Respiratory: Normal respiratory effort, no increased work of breathing. ? ?Laboratory Data: ? ?Urinalysis ?Dipstick/microscopy negative ? ?Assessment & Plan:   ? ?1.  History gross hematuria ?No recurrent episodes ?Urinalysis today is  clear ?Follow-up 1 year ? ?2.  Radiation cystitis ? ?3.  History of prostate cancer ?Undetectable PSA June 2022 ? ? ?Abbie Sons, MD ? ?[facility] ?[address] ?[place], [place] [address] ?[phone] ? ?

## 2021-07-21 ENCOUNTER — Other Ambulatory Visit: Payer: Self-pay | Admitting: Nurse Practitioner

## 2021-07-21 DIAGNOSIS — E785 Hyperlipidemia, unspecified: Secondary | ICD-10-CM

## 2021-07-21 DIAGNOSIS — E1169 Type 2 diabetes mellitus with other specified complication: Secondary | ICD-10-CM

## 2021-07-21 NOTE — Telephone Encounter (Signed)
Requested Prescriptions  ?Pending Prescriptions Disp Refills  ?? atorvastatin (LIPITOR) 20 MG tablet [Pharmacy Med Name: ATORVASTATIN CALCIUM 20 MG Tablet] 90 tablet 0  ?  Sig: TAKE 1 TABLET EVERY DAY  ?  ? Cardiovascular:  Antilipid - Statins Failed - 07/21/2021  6:09 AM  ?  ?  Failed - Lipid Panel in normal range within the last 12 months  ?  Cholesterol, Total  ?Date Value Ref Range Status  ?02/04/2021 99 (L) 100 - 199 mg/dL Final  ? ?Cholesterol Piccolo, [place]  ?Date Value Ref Range Status  ?03/21/2018 78 <200 mg/dL Final  ?  Comment:  ?                          Desirable                <200 ?                        Borderline High      200- 239 ?                        High                     >239 ?  ? ?LDL Chol Calc (NIH)  ?Date Value Ref Range Status  ?02/04/2021 51 0 - 99 mg/dL Final  ? ?HDL  ?Date Value Ref Range Status  ?02/04/2021 31 (L) >39 mg/dL Final  ? ?Triglycerides  ?Date Value Ref Range Status  ?02/04/2021 82 0 - 149 mg/dL Final  ? ?Triglycerides Piccolo,Waived  ?Date Value Ref Range Status  ?03/21/2018 67 <150 mg/dL Final  ?  Comment:  ?                          Normal                   <150 ?                        Borderline High     150 - 199 ?                        High                200 - 499 ?                        Very High                >499 ?  ? ?  ?  ?  Passed - Patient is not pregnant  ?  ?  Passed - Valid encounter within last 12 months  ?  Recent Outpatient Visits   ?      ? 2 months ago Type 2 diabetes mellitus with proteinuria ([place])  ? [place], [place] T, NP  ? 5 months ago Type 2 diabetes mellitus with proteinuria ([place])  ? [place], Keshena T, NP  ? 8 months ago Type 2 diabetes mellitus with proteinuria ([place])  ? [place], Olivarez T, NP  ? 11 months ago Type 2 diabetes mellitus with proteinuria ([place])  ? [place], Henrine Screws T, NP  ? 1 year ago Diabetes  mellitus associated with hormonal  etiology ([place])  ? [facility] [place], Henrine Screws T, NP  ?  ?  ?Future Appointments   ?        ? In 2 weeks Cannady, Barbaraann Faster, NP [employer]  ? In 1 year Stoioff, Ronda Fairly, MD [place]  ?  ? ?  ?  ?  ? ?

## 2021-07-31 NOTE — Patient Instructions (Signed)

## 2021-08-04 ENCOUNTER — Other Ambulatory Visit: Payer: Self-pay

## 2021-08-04 ENCOUNTER — Ambulatory Visit (INDEPENDENT_AMBULATORY_CARE_PROVIDER_SITE_OTHER): Payer: Medicare HMO | Admitting: Nurse Practitioner

## 2021-08-04 ENCOUNTER — Encounter: Payer: Self-pay | Admitting: Nurse Practitioner

## 2021-08-04 VITALS — BP 132/70 | HR 58 | Ht 75.0 in | Wt 325.6 lb

## 2021-08-04 DIAGNOSIS — E1169 Type 2 diabetes mellitus with other specified complication: Secondary | ICD-10-CM | POA: Diagnosis not present

## 2021-08-04 DIAGNOSIS — N183 Chronic kidney disease, stage 3 unspecified: Secondary | ICD-10-CM | POA: Diagnosis not present

## 2021-08-04 DIAGNOSIS — R809 Proteinuria, unspecified: Secondary | ICD-10-CM

## 2021-08-04 DIAGNOSIS — E1159 Type 2 diabetes mellitus with other circulatory complications: Secondary | ICD-10-CM | POA: Diagnosis not present

## 2021-08-04 DIAGNOSIS — I700 Atherosclerosis of aorta: Secondary | ICD-10-CM | POA: Diagnosis not present

## 2021-08-04 DIAGNOSIS — Z794 Long term (current) use of insulin: Secondary | ICD-10-CM

## 2021-08-04 DIAGNOSIS — Z9641 Presence of insulin pump (external) (internal): Secondary | ICD-10-CM

## 2021-08-04 DIAGNOSIS — E113293 Type 2 diabetes mellitus with mild nonproliferative diabetic retinopathy without macular edema, bilateral: Secondary | ICD-10-CM | POA: Diagnosis not present

## 2021-08-04 DIAGNOSIS — E1129 Type 2 diabetes mellitus with other diabetic kidney complication: Secondary | ICD-10-CM | POA: Diagnosis not present

## 2021-08-04 DIAGNOSIS — E1122 Type 2 diabetes mellitus with diabetic chronic kidney disease: Secondary | ICD-10-CM

## 2021-08-04 DIAGNOSIS — G4733 Obstructive sleep apnea (adult) (pediatric): Secondary | ICD-10-CM

## 2021-08-04 DIAGNOSIS — G894 Chronic pain syndrome: Secondary | ICD-10-CM

## 2021-08-04 DIAGNOSIS — I152 Hypertension secondary to endocrine disorders: Secondary | ICD-10-CM | POA: Diagnosis not present

## 2021-08-04 DIAGNOSIS — Z8546 Personal history of malignant neoplasm of prostate: Secondary | ICD-10-CM

## 2021-08-04 DIAGNOSIS — M1A0720 Idiopathic chronic gout, left ankle and foot, without tophus (tophi): Secondary | ICD-10-CM

## 2021-08-04 DIAGNOSIS — Z6841 Body Mass Index (BMI) 40.0 and over, adult: Secondary | ICD-10-CM

## 2021-08-04 LAB — MICROALBUMIN, URINE WAIVED
Creatinine, Urine Waived: 200 mg/dL (ref 10–300)
Microalb, Ur Waived: 10 mg/L (ref 0–19)
Microalb/Creat Ratio: <30 mg/g (ref <30)

## 2021-08-04 LAB — BAYER DCA HB A1C WAIVED: HB A1C (BAYER DCA - WAIVED): 6.3 % — ABNORMAL HIGH (ref 4.8–5.6)

## 2021-08-04 MED ORDER — AMLODIPINE BESYLATE 10 MG PO TABS: 10.0000 mg | ORAL_TABLET | Freq: Every day | ORAL | 4 refills | Status: DC

## 2021-08-04 MED ORDER — ATORVASTATIN CALCIUM 20 MG PO TABS: 20.0000 mg | ORAL_TABLET | Freq: Every day | ORAL | 4 refills | Status: DC

## 2021-08-04 MED ORDER — GABAPENTIN 600 MG PO TABS: 600.0000 mg | ORAL_TABLET | Freq: Every evening | ORAL | 4 refills | Status: DC

## 2021-08-04 MED ORDER — CARVEDILOL 25 MG PO TABS: ORAL_TABLET | ORAL | 4 refills | Status: DC

## 2021-08-04 MED ORDER — ALLOPURINOL 300 MG PO TABS: 300.0000 mg | ORAL_TABLET | Freq: Every day | ORAL | 4 refills | Status: DC

## 2021-08-04 MED ORDER — HYDROCHLOROTHIAZIDE 25 MG PO TABS: 25.0000 mg | ORAL_TABLET | Freq: Every day | ORAL | 4 refills | Status: DC

## 2021-08-04 MED ORDER — LOSARTAN POTASSIUM 100 MG PO TABS: 100.0000 mg | ORAL_TABLET | Freq: Every day | ORAL | 4 refills | Status: DC

## 2021-08-04 MED ORDER — TAMSULOSIN HCL 0.4 MG PO CAPS: 0.4000 mg | ORAL_CAPSULE | Freq: Every day | ORAL | 4 refills | Status: DC

## 2021-08-04 NOTE — Assessment & Plan Note (Signed)
Chronic, stable.  Continue current medication regimen and adjust as needed.  Uric acid up to date.  May need to discontinue HCTZ in future if return of flares presents. ?

## 2021-08-04 NOTE — Assessment & Plan Note (Signed)
Refer to diabetes with proteinuria plan. 

## 2021-08-04 NOTE — Assessment & Plan Note (Signed)
Chronic, ongoing, followed by endocrinology with insulin pump in place.  Continue current collaboration and pump settings per endo -- recent note reviewed, will defer pump changes to them.  A1c 6.3% today -- urine ALB 29 July 2021, continue Losartan for kidney protection.  Recommend focus on diet and modest weight loss.  Does endorse poor diet on occasion.  Recommend he continue to monitor BS closely at home and if any consistent <70 immediately alert endo for pump changes.  Return in 3 months. ?

## 2021-08-04 NOTE — Assessment & Plan Note (Signed)
Recommend focus on modest weight loss with diet changes and regular exercise regimen.   

## 2021-08-04 NOTE — Assessment & Plan Note (Signed)
Stable on recent labs.  Will recheck today and may adjust HCTZ if low levels.  Recommend increase hydration at home.  A1c today 6.3%.  Continue Losartan for kidney protection. ?

## 2021-08-04 NOTE — Assessment & Plan Note (Addendum)
Chronic, ongoing to cervical spine.  Continue collaboration with pain management and neurosurgery on as needed basis, notes reviewed.  Gabapentin refills sent. 

## 2021-08-04 NOTE — Assessment & Plan Note (Signed)
Chronic, ongoing.  Continue current medication regimen and adjust as needed. Lipid panel today. 

## 2021-08-04 NOTE — Assessment & Plan Note (Signed)
Chronic, ongoing.  BP at goal today in office.  Recommend he continue to check BP at home twice daily and document for provider visits. Continue current medication regimen at this time, is on max dose multiple medications.  May need to adjust in future and change HCTZ if return of gout flares or decline in kidney function.  Labs: CMP and urine ALB (urine ALB 10 today).  Return in 3 months for follow-up. ?

## 2021-08-04 NOTE — Assessment & Plan Note (Signed)
Noted on 09/16/19 imaging.  Continue daily statin and ASA for prevention + focus on healthy diet and modest weight loss. ?

## 2021-08-04 NOTE — Assessment & Plan Note (Signed)
Continues to use CPAP faithfully every night and denies any issues with this. ?

## 2021-08-04 NOTE — Progress Notes (Signed)
? ?BP 132/70 (BP Location: Left Arm, Cuff Size: Normal)   Pulse (!) 58   Ht '6\' 3"'$  (1.905 m)   Wt (!) 325 lb 9.6 oz (147.7 kg)   SpO2 98%   BMI 40.70 kg/m?   ? ?Subjective:  ? ? Patient ID: Aaron [name], male    DOB: Jun 05, 1950, 71 y.o.   MRN: [id number] ? ?HPI: ?Aaron [name] is a 71 y.o. male ? ?Chief Complaint  ?Patient presents with  ? Diabetes  ? Hyperlipidemia  ? Hypertension  ? Gout  ? Chronic Kidney Disease  ? Benign Prostatic Hypertrophy  ? ?DIABETES ?Followed by Dr. Gabriel Carina with last visit 04/04/21 with 6.8% A1c last check.  He returns to see them on 08/15/21.   ? ?Uses Tandem T:slim insulin pump (04/2020 placed), per Dr. Gabriel Carina last note 04/04/21: ?"Basal rates Correction factor Carb ratio Target BG ?12 am 2.45 units/hr 20 8 110 ?4 am 2.0 units/hr 20 8 110 ?8:30 am 1.2 units/hr 20 8 110 ?11 am 1.8 units.hr 20 6 110 ?8 pm 2.2 units/hr 20 6 110  ?24-hr basal = 46.8 units Humalog U200 insulin (equals 93.6 units of U100 insulin) " ? ?Hypoglycemic episodes: has had more lows recently, 5 reported since November ?Polydipsia/polyuria: no ?Visual disturbance: no ?Chest pain: no ?Paresthesias: no ?Glucose Monitoring: yes ?            Accucheck frequency: QID -- on average 118 to 140, occasionally a 200 after eating ?            Fasting glucose:  ?            Post prandial:  ?            Evening:  ?            Before meals:  ?Taking Insulin?: yes ?            Long acting insulin: ?            Short acting insulin: ?Blood Pressure Monitoring: not checking ?Retinal Examination: Up to Date = Dr. Matilde Sprang for retinopathy ?Foot Exam: Up to Date ?Pneumovax: Up to Date ?Influenza: Up to Date ?Aspirin: yes  ?  ?HYPERTENSION / HYPERLIPIDEMIA ?Continues on Losartan 100 MG, HCTZ 25 MG daily, Carvedilol 25 MG BID, Amlodipine 10 MG daily.  Takes Lipitor 20 MG.   ? ?Has OSA and uses CPAP every night.  History on 09/16/19 imaging aortic atherosclerosis noted. ?Satisfied with current treatment? yes ?Duration of hypertension:  chronic ?BP monitoring frequency: daily ?BP range: average <130/80 ?BP medication side effects: no ?Duration of hyperlipidemia: chronic ?Cholesterol medication side effects: no ?Cholesterol supplements: none ?Medication compliance: good compliance ?Aspirin: yes ?Recent stressors: no ?Recurrent headaches: no ?Visual changes: no ?Palpitations: no ?Dyspnea: no ?Chest pain: no ?Lower extremity edema: no ?Dizzy/lightheaded: no  ? ?CHRONIC KIDNEY DISEASE ?Labs improved on recent checks. ?CKD status: stable ?Medications renally dose: yes ?Previous renal evaluation: no ?Pneumovax:  Up to Date ?Influenza Vaccine:  Up to Date  ? 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  Last urology visit was 07/20/21. Cystoscopy last 12/29/2019.  No changes recent visit and is to follow-up in one year.  Continues on Flomax. ? ?CHRONIC NECK PAIN ?Followed by pain clinic in past, last visit with Dr. Holley Raring was 11/09/20 for chronic pain and spinal stenosis in cervical spine.  He is to return as needed.  Is taking Gabapentin 600 MG QHS.  Saw Dr. Lacinda Axon on 04/19/21 and  no changes made, to return as needed. ?. ?History of gout, taking Allopurinol --- has not had flare in many years -- left great toe. ?Treatments attempted: rest, ice, heat, APAP and physical therapy , Gabapentin ?Compliant with recommended treatment: yes ?Relief with NSAIDs?:  No NSAIDs Taken ?Location:midline ?Duration:chronic ?Severity: moderate ?Quality: dull and aching ?Frequency: constant ?Radiation: none ?Aggravating factors: lifting, movement and bending ?Alleviating factors: rest, ice, heat, APAP and muscle relaxer. Gabapentin ?Weakness:  no ?Paresthesias / decreased sensation:  no  ?Fevers:  no  ? ?Relevant past medical, surgical, family and social history reviewed and updated as indicated. Interim medical history since our last visit reviewed. ?Allergies and medications reviewed and updated. ? ?Review of Systems  ?Constitutional:  Negative for activity change,  diaphoresis, fatigue and fever.  ?Respiratory:  Negative for cough, chest tightness, shortness of breath and wheezing.   ?Cardiovascular:  Negative for chest pain, palpitations and leg swelling.  ?Gastrointestinal: Negative.   ?Endocrine: Negative for cold intolerance, heat intolerance, polydipsia, polyphagia and polyuria.  ?Musculoskeletal:  Positive for neck pain.  ?Neurological: Negative.   ?Psychiatric/Behavioral: Negative.    ? ?Per HPI unless specifically indicated above ? ?   ?Objective:  ?  ?BP 132/70 (BP Location: Left Arm, Cuff Size: Normal)   Pulse (!) 58   Ht '6\' 3"'$  (1.905 m)   Wt (!) 325 lb 9.6 oz (147.7 kg)   SpO2 98%   BMI 40.70 kg/m?   ?Wt Readings from Last 3 Encounters:  ?08/04/21 (!) 325 lb 9.6 oz (147.7 kg)  ?07/20/21 (!) 325 lb (147.4 kg)  ?06/22/21 (!) 325 lb (147.4 kg)  ?  ?Physical Exam ?Vitals and nursing note reviewed.  ?Constitutional:   ?   General: He is awake. He is not in acute distress. ?   Appearance: Normal appearance. He is well-groomed. He is morbidly obese. He is not ill-appearing or toxic-appearing.  ?HENT:  ?   Head: Normocephalic and atraumatic.  ?   Right Ear: Hearing and external ear normal. No drainage.  ?   Left Ear: Hearing and external ear normal. No drainage.  ?Eyes:  ?   General: No scleral icterus.    ?   Right eye: No discharge.     ?   Left eye: No discharge.  ?   Pupils: Pupils are equal, round, and reactive to light.  ?Cardiovascular:  ?   Rate and Rhythm: Normal rate and regular rhythm.  ?   Pulses: Normal pulses.  ?   Heart sounds: Normal heart sounds. No murmur heard. ?  No friction rub. No gallop.  ?Pulmonary:  ?   Effort: Pulmonary effort is normal. No respiratory distress.  ?   Breath sounds: Normal breath sounds. No stridor. No wheezing, rhonchi or rales.  ?Chest:  ?   Chest wall: No tenderness.  ?Musculoskeletal:  ?   Cervical back: Normal range of motion and neck supple.  ?Skin: ?   General: Skin is warm and dry.  ?   Capillary Refill: Capillary  refill takes less than 2 seconds.  ?   Coloration: Skin is not jaundiced or pale.  ?   Findings: No bruising, erythema, lesion or rash.  ?Neurological:  ?   General: No focal deficit present.  ?   Mental Status: He is alert and oriented to person, place, and time. Mental status is at baseline.  ?Psychiatric:     ?   Mood and Affect: Mood normal.     ?   Behavior:  Behavior normal. Behavior is cooperative.     ?   Thought Content: Thought content normal.     ?   Judgment: Judgment normal.  ? ?Results for orders placed or performed in visit on 07/20/21  ?Microscopic Examination  ? Urine  ?Result Value Ref Range  ? WBC, UA 0-5 0 - 5 /hpf  ? RBC 0-2 0 - 2 /hpf  ? Epithelial Cells (non renal) 0-10 0 - 10 /hpf  ? Bacteria, UA Few (A) None seen/Few  ?Urinalysis, Complete  ?Result Value Ref Range  ? Specific Gravity, UA 1.025 1.005 - 1.030  ? pH, UA 6.0 5.0 - 7.5  ? Color, UA Yellow Yellow  ? Appearance Ur Clear Clear  ? Leukocytes,UA Negative Negative  ? Protein,UA Negative Negative/Trace  ? Glucose, UA Negative Negative  ? Ketones, UA Trace (A) Negative  ? RBC, UA 1+ (A) Negative  ? Bilirubin, UA Negative Negative  ? Urobilinogen, Ur 1.0 0.2 - 1.0 mg/dL  ? Nitrite, UA Negative Negative  ? Microscopic Examination See below:   ? ?   ?Assessment & Plan:  ? ?Problem List Items Addressed This Visit   ? ?  ? Cardiovascular and Mediastinum  ? Aortic atherosclerosis ([place])  ?  Noted on 09/16/19 imaging.  Continue daily statin and ASA for prevention + focus on healthy diet and modest weight loss. ?  ?  ? Relevant Medications  ? losartan (COZAAR) 100 MG tablet  ? hydrochlorothiazide (HYDRODIURIL) 25 MG tablet  ? atorvastatin (LIPITOR) 20 MG tablet  ? amLODipine (NORVASC) 10 MG tablet  ? carvedilol (COREG) 25 MG tablet  ? Other Relevant Orders  ? Comprehensive metabolic panel  ? Lipid Panel w/o Chol/HDL Ratio  ? Hypertension associated with diabetes ([place])  ?  Chronic, ongoing.  BP at goal today in office.  Recommend he continue to  check BP at home twice daily and document for provider visits. Continue current medication regimen at this time, is on max dose multiple medications.  May need to adjust in future and change HCTZ if return of gout flares or

## 2021-08-04 NOTE — Assessment & Plan Note (Signed)
Continue to collaborate with endocrinology. 

## 2021-08-04 NOTE — Assessment & Plan Note (Signed)
BMI 40.70 with T2DM, HTN.  Recommended eating smaller high protein, low fat meals more frequently and exercising 30 mins a day 5 times a week with a goal of 10-15lb weight loss in the next 3 months. Patient voiced their understanding and motivation to adhere to these recommendations. ? ?

## 2021-08-04 NOTE — Assessment & Plan Note (Signed)
Continue to collaborate with urology as needed.  PSA up to date. ?

## 2021-08-05 LAB — COMPREHENSIVE METABOLIC PANEL
ALT: 9 IU/L (ref 0–44)
AST: 14 IU/L (ref 0–40)
Albumin/Globulin Ratio: 1.5 (ref 1.2–2.2)
Albumin: 4 g/dL (ref 3.8–4.8)
Alkaline Phosphatase: 159 IU/L — ABNORMAL HIGH (ref 44–121)
BUN/Creatinine Ratio: 13 (ref 10–24)
BUN: 16 mg/dL (ref 8–27)
Bilirubin Total: 0.3 mg/dL (ref 0.0–1.2)
CO2: 26 mmol/L (ref 20–29)
Calcium: 9.1 mg/dL (ref 8.6–10.2)
Chloride: 103 mmol/L (ref 96–106)
Creatinine, Ser: 1.21 mg/dL (ref 0.76–1.27)
Globulin, Total: 2.7 g/dL (ref 1.5–4.5)
Glucose: 128 mg/dL — ABNORMAL HIGH (ref 70–99)
Potassium: 4 mmol/L (ref 3.5–5.2)
Sodium: 140 mmol/L (ref 134–144)
Total Protein: 6.7 g/dL (ref 6.0–8.5)
eGFR: 65 mL/min/{1.73_m2} (ref >59)

## 2021-08-05 LAB — LIPID PANEL W/O CHOL/HDL RATIO
Cholesterol, Total: 92 mg/dL — ABNORMAL LOW (ref 100–199)
HDL: 32 mg/dL — ABNORMAL LOW (ref >39)
LDL Chol Calc (NIH): 43 mg/dL (ref 0–99)
Triglycerides: 80 mg/dL (ref 0–149)
VLDL Cholesterol Cal: 17 mg/dL (ref 5–40)

## 2021-08-05 NOTE — Progress Notes (Signed)
Contacted via [facility] ? ? ?Good afternoon Mr. Xiang, your labs have returned. Cholesterol labs remain at goal continue current medications.  Kidney function, creatinine and eGFR, remains normal, as is liver function, AST and ALT.  Alkaline Phosphatase remains a little high, do you still have your gall bladder?  We will monitor this.  Any questions? ?Keep being amazing!!  Thank you for allowing me to participate in your care.  I appreciate you. ?Kindest regards, ?Celia Friedland ?

## 2021-08-15 DIAGNOSIS — E669 Obesity, unspecified: Secondary | ICD-10-CM | POA: Diagnosis not present

## 2021-08-15 DIAGNOSIS — E113293 Type 2 diabetes mellitus with mild nonproliferative diabetic retinopathy without macular edema, bilateral: Secondary | ICD-10-CM | POA: Diagnosis not present

## 2021-08-15 DIAGNOSIS — Z794 Long term (current) use of insulin: Secondary | ICD-10-CM | POA: Diagnosis not present

## 2021-08-15 DIAGNOSIS — E1169 Type 2 diabetes mellitus with other specified complication: Secondary | ICD-10-CM | POA: Diagnosis not present

## 2021-08-15 DIAGNOSIS — E11649 Type 2 diabetes mellitus with hypoglycemia without coma: Secondary | ICD-10-CM | POA: Diagnosis not present

## 2021-08-15 DIAGNOSIS — Z9641 Presence of insulin pump (external) (internal): Secondary | ICD-10-CM | POA: Diagnosis not present

## 2021-08-22 DIAGNOSIS — E1165 Type 2 diabetes mellitus with hyperglycemia: Secondary | ICD-10-CM | POA: Diagnosis not present

## 2021-08-31 DIAGNOSIS — H2513 Age-related nuclear cataract, bilateral: Secondary | ICD-10-CM | POA: Diagnosis not present

## 2021-08-31 DIAGNOSIS — E119 Type 2 diabetes mellitus without complications: Secondary | ICD-10-CM | POA: Diagnosis not present

## 2021-08-31 DIAGNOSIS — H52223 Regular astigmatism, bilateral: Secondary | ICD-10-CM | POA: Diagnosis not present

## 2021-08-31 DIAGNOSIS — H5203 Hypermetropia, bilateral: Secondary | ICD-10-CM | POA: Diagnosis not present

## 2021-08-31 DIAGNOSIS — H04213 Epiphora due to excess lacrimation, bilateral lacrimal glands: Secondary | ICD-10-CM | POA: Diagnosis not present

## 2021-08-31 DIAGNOSIS — H524 Presbyopia: Secondary | ICD-10-CM | POA: Diagnosis not present

## 2021-08-31 DIAGNOSIS — H40013 Open angle with borderline findings, low risk, bilateral: Secondary | ICD-10-CM | POA: Diagnosis not present

## 2021-08-31 DIAGNOSIS — Z7984 Long term (current) use of oral hypoglycemic drugs: Secondary | ICD-10-CM | POA: Diagnosis not present

## 2021-09-27 LAB — HM DIABETES EYE EXAM

## 2021-09-28 ENCOUNTER — Telehealth: Payer: Self-pay

## 2021-09-28 NOTE — Progress Notes (Deleted)
Chronic Care Management Pharmacy Assistant   Name: Aaron [name]  MRN: [id number] DOB: 07/17/1950  Reason for Encounter: Disease State General  Recent office visits:  08/04/21-Aaron T. Ned Card, NP (PCP) General follow up visit. Labs ordered. Follow up in 3 months.  Recent consult visits:  07/20/21-Aaron C. Bernardo Heater, MD (Urology) Seen for hematuria. Follow up in 1 year.  Hospital visits:  None in previous 6 months  Medications: Outpatient Encounter Medications as of 09/28/2021  Medication Sig   ACCU-CHEK GUIDE test strip    allopurinol (ZYLOPRIM) 300 MG tablet Take 1 tablet (300 mg total) by mouth daily.   amLODipine (NORVASC) 10 MG tablet Take 1 tablet (10 mg total) by mouth daily.   ASPIRIN 81 PO every evening.    atorvastatin (LIPITOR) 20 MG tablet Take 1 tablet (20 mg total) by mouth daily.   carvedilol (COREG) 25 MG tablet TAKE 1 TABLET TWICE DAILY WITH A MEAL   gabapentin (NEURONTIN) 600 MG tablet Take 1-1.5 tablets (600-900 mg total) by mouth at bedtime.   hydrochlorothiazide (HYDRODIURIL) 25 MG tablet Take 1 tablet (25 mg total) by mouth daily.   insulin aspart (NOVOLOG) 100 UNIT/ML injection Inject into the skin. Using via insulin pump  Medtronic MiniMed 630G insulin pump   Insulin Human (INSULIN PUMP) SOLN Inject into the skin.   insulin lispro (HUMALOG KWIKPEN) 200 UNIT/ML KwikPen Inject into the skin.   losartan (COZAAR) 100 MG tablet Take 1 tablet (100 mg total) by mouth daily.   pantoprazole (PROTONIX) 20 MG tablet TAKE 1 TABLET(20 MG) BY MOUTH EVERY DAY   tamsulosin (FLOMAX) 0.4 MG CAPS capsule Take 1 capsule (0.4 mg total) by mouth daily.   No facility-administered encounter medications on file as of 09/28/2021.   [facility] for General Review Call   Chart Review:  Have there been any documented new, changed, or discontinued medications since last visit? No (If yes, include name, dose, frequency, date)  Has there been any documented  recent hospitalizations or ED visits since last visit with Clinical Pharmacist? No   Adherence Review:  Does the Clinical Pharmacist Assistant have access to adherence rates? Yes  Adherence rates for STAR metric medications (List medication(s)/day supply/ last 2 fill dates). See not below  Does the patient have >5 day gap between last estimated fill dates for any of the above medications or other medication gaps? No   Disease State Questions:  Able to connect with Patient? {yes/no:20286} Did patient have any problems with their health recently? {yes/no:20286} Note problems and Concerns:  Have you had any admissions or emergency room visits or worsening of your condition(s) since last visit? No Details of ED visit, hospital visit and/or worsening condition(s):N/a  Have you had any visits with new specialists or providers since your last visit? {yes/no:20286} Explain:  Have you had any new health care problem(s) since your last visit? {yes/no:20286} New problem(s) reported:  Have you run out of any of your medications since you last spoke with clinical pharmacist? {yes/no:20286} What caused you to run out of your medications?  Are there any medications you are not taking as prescribed? {yes/no:20286} What kept you from taking your medications as prescribed?  Are you having any issues or side effects with your medications? {yes/no:20286} Note of issues or side effects:  Do you have any other health concerns or questions you want to discuss with your Clinical Pharmacist before your next visit? {yes/no:20286} Note additional concerns and questions from Patient.  Are there  any health concerns that you feel we can do a better job addressing? {yes/no:20286} Note Patient's response.  Are you having any problems with any of the following since the last visit: (select all that apply)  {General Call:27390}  Details:  12. Any falls since last visit? {yes/no:20286}  Details:  13.  Any increased or uncontrolled pain since last visit? {yes/no:20286}  Details:  14. Next visit Type: {Telephone/Office:25179}       Visit with:        Date:        Time:  61. Additional Details? No    Care Gaps: Zoster Vaccines:Never done TETANUS/TDAP:Last completed: May 31, 2011 FOOT EXAM:Last completed: Aug 04, 2020  Star Rating Drugs: Losartan 100 mg Last filled:08/13/21 90 DS Atorvastatin 20 mg Last filled:07/21/21 90 ds Humalog 200 unit Last filled:None noted Novolog 10 unit Last filled:08/05/19 30 DS  Aaron [name], Aaron [name]

## 2021-10-04 ENCOUNTER — Encounter: Payer: Self-pay | Admitting: Nurse Practitioner

## 2021-10-11 NOTE — Chronic Care Management (AMB) (Signed)
Chronic Care Management Pharmacy Assistant    Name: SAJJAD HONEA         MRN: [id number]        DOB: 1951/04/14   Reason for Encounter: Disease State General   Recent office visits:  08/04/21-[name], NP (PCP) General follow up visit. Labs ordered. Follow up in 3 months.   Recent consult visits:  07/20/21-[name], MD (Urology) Seen for hematuria. Follow up in 1 year.   Hospital visits:  None in previous 6 months   Medications:     Outpatient Encounter Medications as of 09/28/2021  Medication Sig   ACCU-CHEK GUIDE test strip     allopurinol (ZYLOPRIM) 300 MG tablet Take 1 tablet (300 mg total) by mouth daily.   amLODipine (NORVASC) 10 MG tablet Take 1 tablet (10 mg total) by mouth daily.   ASPIRIN 81 PO every evening.    atorvastatin (LIPITOR) 20 MG tablet Take 1 tablet (20 mg total) by mouth daily.   carvedilol (COREG) 25 MG tablet TAKE 1 TABLET TWICE DAILY WITH A MEAL   gabapentin (NEURONTIN) 600 MG tablet Take 1-1.5 tablets (600-900 mg total) by mouth at bedtime.   hydrochlorothiazide (HYDRODIURIL) 25 MG tablet Take 1 tablet (25 mg total) by mouth daily.   insulin aspart (NOVOLOG) 100 UNIT/ML injection Inject into the skin. Using via insulin pump  Medtronic MiniMed 630G insulin pump   Insulin Human (INSULIN PUMP) SOLN Inject into the skin.   insulin lispro (HUMALOG KWIKPEN) 200 UNIT/ML KwikPen Inject into the skin.   losartan (COZAAR) 100 MG tablet Take 1 tablet (100 mg total) by mouth daily.   pantoprazole (PROTONIX) 20 MG tablet TAKE 1 TABLET(20 MG) BY MOUTH EVERY DAY   tamsulosin (FLOMAX) 0.4 MG CAPS capsule Take 1 capsule (0.4 mg total) by mouth daily.    No facility-administered encounter medications on file as of 09/28/2021.    [facility] for General Review Call     Chart Review:   Have there been any documented new, changed, or discontinued medications since last visit? No (If yes, include name, dose, frequency, date)   Has  there been any documented recent hospitalizations or ED visits since last visit with Clinical Pharmacist? No     Adherence Review:   Does the Clinical Pharmacist Assistant have access to adherence rates? Yes   Adherence rates for STAR metric medications (List medication(s)/day supply/ last 2 fill dates). See not below   Does the patient have >5 day gap between last estimated fill dates for any of the above medications or other medication gaps? No     Disease State Questions:   Able to connect with Patient? Yes Did patient have any problems with their health recently? No Note problems and Concerns:Patient states he has no health problems recently.   Have you had any admissions or emergency room visits or worsening of your condition(s) since last visit? No Details of ED visit, hospital visit and/or worsening condition(s):N/a   Have you had any visits with new specialists or providers since your last visit? No Explain:N/a   Have you had any new health care problem(s) since your last visit? No New problem(s) reported:Patient states he has no new health care problems.   Have you run out of any of your medications since you last spoke with clinical pharmacist? No What caused you to run out of your medications? Patient states he is up to date with his medications.   Are there any  medications you are not taking as prescribed? No What kept you from taking your medications as prescribed?N/a   Are you having any issues or side effects with your medications? No Note of issues or side effects:Patient states he has no issues or side effects to any of his medications.   Do you have any other health concerns or questions you want to discuss with your Clinical Pharmacist before your next visit? No Note additional concerns and questions from Patient. Patient states there is nothing at this time.   Are there any health concerns that you feel we can do a better job addressing? No Note Patient's  response. Patient states there is nothing at this time.   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   12. Any falls since last visit? No             Details:Patient states he has not had any falls   13. Any increased or uncontrolled pain since last visit? No             Details:N/a   14. Next visit Type: office       Visit with:Jolene T. Cannady        Date:11/23/21        Time:9:20am   15. Additional Details? No           Care Gaps: Zoster Vaccines:Never done TETANUS/TDAP:Last completed: May 31, 2011 FOOT EXAM:Last completed: Aug 04, 2020   Star Rating Drugs: Losartan 100 mg Last filled:08/13/21 90 DS Atorvastatin 20 mg Last filled:07/21/21 90 ds Humalog 200 unit Last filled:None noted Novolog 10 unit Last filled:08/05/19 30 DS   [name], [place]

## 2021-11-08 ENCOUNTER — Encounter: Payer: Medicare HMO | Admitting: Nurse Practitioner

## 2021-11-10 DIAGNOSIS — E1165 Type 2 diabetes mellitus with hyperglycemia: Secondary | ICD-10-CM | POA: Diagnosis not present

## 2021-11-16 DIAGNOSIS — E11649 Type 2 diabetes mellitus with hypoglycemia without coma: Secondary | ICD-10-CM | POA: Diagnosis not present

## 2021-11-20 NOTE — Patient Instructions (Signed)

## 2021-11-21 DIAGNOSIS — S6992XA Unspecified injury of left wrist, hand and finger(s), initial encounter: Secondary | ICD-10-CM | POA: Diagnosis not present

## 2021-11-21 DIAGNOSIS — Z9641 Presence of insulin pump (external) (internal): Secondary | ICD-10-CM | POA: Diagnosis not present

## 2021-11-21 DIAGNOSIS — M20012 Mallet finger of left finger(s): Secondary | ICD-10-CM | POA: Diagnosis not present

## 2021-11-21 DIAGNOSIS — E1169 Type 2 diabetes mellitus with other specified complication: Secondary | ICD-10-CM | POA: Diagnosis not present

## 2021-11-21 DIAGNOSIS — E669 Obesity, unspecified: Secondary | ICD-10-CM | POA: Diagnosis not present

## 2021-11-23 ENCOUNTER — Ambulatory Visit (INDEPENDENT_AMBULATORY_CARE_PROVIDER_SITE_OTHER): Payer: Medicare HMO | Admitting: Nurse Practitioner

## 2021-11-23 ENCOUNTER — Encounter: Payer: Self-pay | Admitting: Nurse Practitioner

## 2021-11-23 VITALS — BP 126/64 | HR 61 | Temp 98.2°F | Ht 73.5 in | Wt 329.8 lb

## 2021-11-23 DIAGNOSIS — E1122 Type 2 diabetes mellitus with diabetic chronic kidney disease: Secondary | ICD-10-CM | POA: Diagnosis not present

## 2021-11-23 DIAGNOSIS — I700 Atherosclerosis of aorta: Secondary | ICD-10-CM | POA: Diagnosis not present

## 2021-11-23 DIAGNOSIS — K219 Gastro-esophageal reflux disease without esophagitis: Secondary | ICD-10-CM

## 2021-11-23 DIAGNOSIS — Z8546 Personal history of malignant neoplasm of prostate: Secondary | ICD-10-CM

## 2021-11-23 DIAGNOSIS — R809 Proteinuria, unspecified: Secondary | ICD-10-CM | POA: Diagnosis not present

## 2021-11-23 DIAGNOSIS — Z9641 Presence of insulin pump (external) (internal): Secondary | ICD-10-CM

## 2021-11-23 DIAGNOSIS — E1169 Type 2 diabetes mellitus with other specified complication: Secondary | ICD-10-CM

## 2021-11-23 DIAGNOSIS — M1A0720 Idiopathic chronic gout, left ankle and foot, without tophus (tophi): Secondary | ICD-10-CM | POA: Diagnosis not present

## 2021-11-23 DIAGNOSIS — I152 Hypertension secondary to endocrine disorders: Secondary | ICD-10-CM | POA: Diagnosis not present

## 2021-11-23 DIAGNOSIS — E113293 Type 2 diabetes mellitus with mild nonproliferative diabetic retinopathy without macular edema, bilateral: Secondary | ICD-10-CM

## 2021-11-23 DIAGNOSIS — Z794 Long term (current) use of insulin: Secondary | ICD-10-CM | POA: Diagnosis not present

## 2021-11-23 DIAGNOSIS — E1129 Type 2 diabetes mellitus with other diabetic kidney complication: Secondary | ICD-10-CM | POA: Diagnosis not present

## 2021-11-23 DIAGNOSIS — Z0000 Encounter for general adult medical examination without abnormal findings: Secondary | ICD-10-CM | POA: Diagnosis not present

## 2021-11-23 DIAGNOSIS — E1159 Type 2 diabetes mellitus with other circulatory complications: Secondary | ICD-10-CM

## 2021-11-23 DIAGNOSIS — G4733 Obstructive sleep apnea (adult) (pediatric): Secondary | ICD-10-CM

## 2021-11-23 DIAGNOSIS — Z6841 Body Mass Index (BMI) 40.0 and over, adult: Secondary | ICD-10-CM

## 2021-11-23 DIAGNOSIS — Z23 Encounter for immunization: Secondary | ICD-10-CM

## 2021-11-23 DIAGNOSIS — E785 Hyperlipidemia, unspecified: Secondary | ICD-10-CM

## 2021-11-23 DIAGNOSIS — N183 Chronic kidney disease, stage 3 unspecified: Secondary | ICD-10-CM

## 2021-11-23 DIAGNOSIS — G894 Chronic pain syndrome: Secondary | ICD-10-CM

## 2021-11-23 MED ORDER — SHINGRIX 50 MCG/0.5ML IM SUSR: 0.5000 mL | Freq: Once | INTRAMUSCULAR | 0 refills | Status: AC

## 2021-11-23 NOTE — Assessment & Plan Note (Signed)
Refer to diabetes with proteinuria plan. 

## 2021-11-23 NOTE — Assessment & Plan Note (Signed)
Continue collaboration with urology as needed. Check PSA today.

## 2021-11-23 NOTE — Assessment & Plan Note (Signed)
Chronic, ongoing. BP at goal in office today. Recommend he continue to check BP at home and document for provider visits. Continue current medication regimen and adjust as needed. Labs today: CMP. Return in 6 months.

## 2021-11-23 NOTE — Assessment & Plan Note (Signed)
BMI 42.92. Recommend continued focus on modest weight loss, diet changes and regular exercise regimen.

## 2021-11-23 NOTE — Progress Notes (Signed)
BP 126/64   Pulse 61   Temp 98.2 F (36.8 C) (Oral)   Ht 6' 1.5" (1.867 m)   Wt (!) 329 lb 12.8 oz (149.6 kg)   SpO2 98%   BMI 42.92 kg/m    Subjective:    Patient ID: Aaron [name], male    DOB: [birth year], 71 y.o.   MRN: [id number]  NOTE WRITTEN BY FNP STUDENT.  ASSESSMENT AND PLAN OF CARE REVIEWED WITH STUDENT, AGREE WITH ABOVE FINDINGS AND PLAN.   HPI: Aaron [name] is a 71 y.o. male presenting on 11/23/2021 for comprehensive medical examination. Current medical complaints include:none  He currently lives with: wife and grandchildren  Interim Problems from his last visit: no  DIABETES Followed by Dr. Gabriel Carina with last visit 11/21/21 with 6.8% A1c.   Uses Tandem T:slim insulin pump (04/2020 placed), per Dr. Gabriel Carina last note 04/04/21: "Basal rates Correction factor Carb ratio Target BG 12 am 2.45 units/hr 20 8 110 4 am 1.85 units/hr 20 8 110 8:30 am 1.1 units/hr 20 8 110 11 am 1.5 units.hr 20 6 110 8 pm 2.2 units/hr 20 6 110  24-hr basal = 43.175 units Humalog U200 insulin (equals 86.35 units of U100 insulin)" Hypoglycemic episodes:no Polydipsia/polyuria: no Visual disturbance: no Chest pain: no Paresthesias: no Glucose Monitoring: no  Accucheck frequency:  CGM  Fasting glucose:  Post prandial:  Evening:  Before meals: Taking Insulin?: yes  Long acting insulin:  Short acting insulin: Blood Pressure Monitoring: daily Retinal Examination: Up to Date Foot Exam:  done today Diabetic Education: Completed Pneumovax: Up to Date Influenza: Up to Date Aspirin: yes   HYPERTENSION / HYPERLIPIDEMIA Continues on Losartan 100 MG, HCTZ 25 MG daily, Carvedilol 25 MG BID, Amlodipine 10 MG daily.  Takes Lipitor 20 MG.     Has OSA and uses CPAP every night.  History on 09/16/19 imaging aortic atherosclerosis noted. Satisfied with current treatment? yes Duration of hypertension: years BP monitoring frequency: daily BP range: 130s BP medication side effects: no Duration  of hyperlipidemia: years Cholesterol medication side effects: no Cholesterol supplements: none Medication compliance: excellent compliance Aspirin: yes Recent stressors: no Recurrent headaches: no Visual changes: no Palpitations: no Dyspnea: no Chest pain: no Lower extremity edema: yes Dizzy/lightheaded: no   CHRONIC KIDNEY DISEASE Stable on recent labs.  History of prostate cancer with removal years ago and table PSA ongoing. CKD status: stable Medications renally dose: no Previous renal evaluation: no Pneumovax:  Up to Date Influenza Vaccine:  Up to Date   GOUT Continues on Allopurinol. Continues on Protonix daily for GERD. Duration:chronic Swelling: no Redness: no Trauma: no Recent dietary change or indiscretion: no Fevers: no Nausea/vomiting: no Aggravating factors: Alleviating factors:  Status:  better Treatments attempted:     Functional Status Survey: Is the patient deaf or have difficulty hearing?: No Does the patient have difficulty seeing, even when wearing glasses/contacts?: No Does the patient have difficulty concentrating, remembering, or making decisions?: No Does the patient have difficulty walking or climbing stairs?: No Does the patient have difficulty dressing or bathing?: No Does the patient have difficulty doing errands alone such as visiting a doctor's office or shopping?: No  FALL RISK:    02/13/2021   10:41 AM 11/09/2020    8:24 AM 07/15/2020    8:29 AM 06/03/2020    8:39 AM 05/04/2020    9:29 AM  [place] in the past year? 0 0 0 0 0  Number falls in past yr:  0    0  Injury with Fall? 0    0  Risk for fall due to : No Fall Risks      Follow up Falls evaluation completed    Falls evaluation completed    Depression Screen    11/23/2021    9:44 AM 08/04/2021    8:34 AM 05/06/2021   10:26 AM 02/13/2021   10:42 AM 02/13/2021   10:41 AM  Depression screen PHQ 2/9  Decreased Interest 0 0 0 0 0  Down, Depressed, Hopeless 0 0 0 0 0   PHQ - 2 Score 0 0 0 0 0  Altered sleeping 0 0 0 0   Tired, decreased energy 0 0 0 0   Change in appetite 0 0 0 0   Feeling bad or failure about yourself  0 0 0 0   Trouble concentrating 0 0 0 0   Moving slowly or fidgety/restless 0 0 0 0   Suicidal thoughts 0 0 0 0   PHQ-9 Score 0 0 0 0   Difficult doing work/chores Not difficult at all   Not difficult at all       11/23/2021    9:44 AM 08/04/2021    8:34 AM 05/06/2021   10:27 AM 02/13/2021   10:42 AM  GAD 7 : Generalized Anxiety Score  Nervous, Anxious, on Edge 0 0 0 0  Control/stop worrying 0 0 0 0  Worry too much - different things 0 0 0 0  Trouble relaxing 0 0 0 0  Restless 0 0 0 0  Easily annoyed or irritable 0 0 0 0  Afraid - awful might happen 0 0 0 0  Total GAD 7 Score 0 0 0 0  Anxiety Difficulty Not difficult at all Not difficult at all  Not difficult at all     Advanced Directives <no information>  Past Medical History:  Past Medical History:  Diagnosis Date   Cancer ([place]) 2013   prostate (radiation and surgery)   Diabetes mellitus without complication ([place])    Gout    Hyperlipidemia    Hypertension    Sleep apnea    Spinal stenosis in cervical region 06/03/2020    Surgical History:  Past Surgical History:  Procedure Laterality Date   CIRCUMCISION     COLONOSCOPY N/A 06/22/2021   Procedure: COLONOSCOPY;  Surgeon: Tol[place], Benay Pike, MD;  Location: [facility] ENDOSCOPY;  Service: Gastroenterology;  Laterality: N/A;  IDDM   CYST EXCISION Left 2015   arm   deviated septrum  2003   JOINT REPLACEMENT Left 2016   Hip   PROSTATE SURGERY     prostatectomy due to cancer   PROSTATECTOMY  2013   TONSILLECTOMY AND ADENOIDECTOMY  2004   TOTAL HIP ARTHROPLASTY Left 2016   TOTAL HIP ARTHROPLASTY Right 11/27/2017   Procedure: TOTAL HIP ARTHROPLASTY ANTERIOR APPROACH;  Surgeon: Hessie Knows, MD;  Location: [facility] ORS;  Service: Orthopedics;  Laterality: Right;    Medications:  Current Outpatient Medications on File Prior  to Visit  Medication Sig   ACCU-CHEK GUIDE test strip    allopurinol (ZYLOPRIM) 300 MG tablet Take 1 tablet (300 mg total) by mouth daily.   amLODipine (NORVASC) 10 MG tablet Take 1 tablet (10 mg total) by mouth daily.   ASPIRIN 81 PO every evening.    atorvastatin (LIPITOR) 20 MG tablet Take 1 tablet (20 mg total) by mouth daily.   carvedilol (COREG) 25 MG tablet TAKE 1 TABLET TWICE DAILY WITH  A MEAL   gabapentin (NEURONTIN) 600 MG tablet Take 1-1.5 tablets (600-900 mg total) by mouth at bedtime.   hydrochlorothiazide (HYDRODIURIL) 25 MG tablet Take 1 tablet (25 mg total) by mouth daily.   insulin aspart (NOVOLOG) 100 UNIT/ML injection Inject into the skin. Using via insulin pump  Medtronic MiniMed 630G insulin pump   Insulin Human (INSULIN PUMP) SOLN Inject into the skin.   insulin lispro (HUMALOG KWIKPEN) 200 UNIT/ML KwikPen Inject into the skin.   losartan (COZAAR) 100 MG tablet Take 1 tablet (100 mg total) by mouth daily.   pantoprazole (PROTONIX) 20 MG tablet TAKE 1 TABLET(20 MG) BY MOUTH EVERY DAY   tamsulosin (FLOMAX) 0.4 MG CAPS capsule Take 1 capsule (0.4 mg total) by mouth daily.   No current facility-administered medications on file prior to visit.    Allergies:  Allergies  Allergen Reactions   Glucophage [Metformin] Diarrhea    Social History:  Social History   Socioeconomic History   Marital status: Married    Spouse name: Not on file   Number of children: Not on file   Years of education: Not on file   Highest education level: Some college, no degree  Occupational History   Occupation: retired  Tobacco Use   Smoking status: Never   Smokeless tobacco: Never  Vaping Use   Vaping Use: Never used  Substance and Sexual Activity   Alcohol use: No   Drug use: No   Sexual activity: Yes  Other Topics Concern   Not on file  Social History Narrative   Not on file   Social Determinants of Health   Financial Resource Strain: Low Risk  (02/13/2021)   Overall  Financial Resource Strain (CARDIA)    Difficulty of Paying Living Expenses: Not hard at all  Food Insecurity: No Food Insecurity (06/27/2021)   Hunger Vital Sign    Worried About Running Out of Food in the Last Year: Never true    [place] in the Last Year: Never true  Transportation Needs: No Transportation Needs (06/27/2021)   PRAPARE - Hydrologist (Medical): No    Lack of Transportation (Non-Medical): No  Physical Activity: Inactive (02/13/2021)   Exercise Vital Sign    Days of Exercise per Week: 0 days    Minutes of Exercise per Session: 0 min  Stress: No Stress Concern Present (02/13/2021)   [place]    Feeling of Stress : Not at all  Social Connections: Moderately Integrated (02/13/2021)   Social Connection and Isolation Panel [NHANES]    Frequency of Communication with Friends and Family: Twice a week    Frequency of Social Gatherings with Friends and Family: Twice a week    Attends Religious Services: Never    Marine scientist or Organizations: No    Attends Music therapist: 1 to 4 times per year    Marital Status: Married  Human resources officer Violence: Not At Risk (02/13/2021)   Humiliation, Afraid, Rape, and Kick questionnaire    Fear of Current or Ex-Partner: No    Emotionally Abused: No    Physically Abused: No    Sexually Abused: No   Social History   Tobacco Use  Smoking Status Never  Smokeless Tobacco Never   Social History   Substance and Sexual Activity  Alcohol Use No    Family History:  Family History  Problem Relation Age of Onset   Cancer  Mother    Breast cancer Mother    Stroke Father    Cancer Sister    Cancer Maternal Uncle    Stroke Maternal Uncle     Past medical history, surgical history, medications, allergies, family history and social history reviewed with patient today and changes made to appropriate areas of the chart.    ROS All other ROS negative except what is listed above and in the HPI.      Objective:    BP 126/64   Pulse 61   Temp 98.2 F (36.8 C) (Oral)   Ht 6' 1.5" (1.867 m)   Wt (!) 329 lb 12.8 oz (149.6 kg)   SpO2 98%   BMI 42.92 kg/m   Wt Readings from Last 3 Encounters:  11/23/21 (!) 329 lb 12.8 oz (149.6 kg)  08/04/21 (!) 325 lb 9.6 oz (147.7 kg)  07/20/21 (!) 325 lb (147.4 kg)     Physical Exam Vitals and nursing note reviewed.  Constitutional:      General: He is not in acute distress.    Appearance: Normal appearance. He is obese. He is not ill-appearing or toxic-appearing.  HENT:     Head: Normocephalic and atraumatic.     Right Ear: Hearing, tympanic membrane, ear canal and external ear normal. No drainage.     Left Ear: Hearing, tympanic membrane, ear canal and external ear normal. No drainage.     Nose: Nose normal.     Mouth/Throat:     Lips: Pink.     Mouth: Mucous membranes are moist.     Pharynx: Oropharynx is clear. Uvula midline. No posterior oropharyngeal erythema.  Eyes:     General: Lids are normal.     Extraocular Movements: Extraocular movements intact.     Conjunctiva/sclera: Conjunctivae normal.     Pupils: Pupils are equal, round, and reactive to light.  Neck:     Thyroid: No thyromegaly or thyroid tenderness.  Cardiovascular:     Rate and Rhythm: Normal rate and regular rhythm.     Heart sounds: Normal heart sounds. No murmur heard. Pulmonary:     Effort: Pulmonary effort is normal.     Breath sounds: Normal breath sounds.  Abdominal:     General: Abdomen is flat. Bowel sounds are normal.     Palpations: Abdomen is soft.     Tenderness: There is no abdominal tenderness.  Musculoskeletal:     Cervical back: Normal range of motion.     Right lower leg: No edema.     Left lower leg: No edema.  Feet:     Right foot:     Skin integrity: Skin integrity normal. No ulcer.     Left foot:     Skin integrity: Skin integrity normal. No ulcer.   Lymphadenopathy:     Head:     Right side of head: No submental, submandibular, tonsillar, preauricular or posterior auricular adenopathy.     Left side of head: No submental, submandibular, tonsillar, preauricular or posterior auricular adenopathy.     Cervical: No cervical adenopathy.  Skin:    General: Skin is warm.     Capillary Refill: Capillary refill takes less than 2 seconds.  Neurological:     General: No focal deficit present.     Mental Status: He is alert and oriented to person, place, and time.     Sensory: Sensation is intact.     Motor: Motor function is intact.     Coordination: Coordination is intact.  Gait: Gait is intact.     Deep Tendon Reflexes: Reflexes are normal and symmetric.  Psychiatric:        Attention and Perception: Attention normal.        Mood and Affect: Mood normal.        Speech: Speech normal.        Behavior: Behavior normal. Behavior is cooperative.        Thought Content: Thought content normal.        Cognition and Memory: Cognition normal.        Judgment: Judgment normal.    Diabetic Foot Exam - Simple   Simple Foot Form Diabetic Foot exam was performed with the following findings: Yes 11/23/2021  9:43 AM  Visual Inspection No deformities, no ulcerations, no other skin breakdown bilaterally: Yes Sensation Testing Intact to touch and monofilament testing bilaterally: Yes Pulse Check Posterior Tibialis and Dorsalis pulse intact bilaterally: Yes Comments         02/13/2021   10:39 AM 10/23/2018    9:51 AM  6CIT Screen  What Year? 0 points 0 points  What month? 0 points 0 points  What time? 0 points 0 points  Count back from 20 0 points 0 points  Months in reverse 0 points 0 points  Repeat phrase 0 points 0 points  Total Score 0 points 0 points    Results for orders placed or performed in visit on 08/04/21  Microalbumin, Urine Waived  Result Value Ref Range   Microalb, Ur Waived 10 0 - 19 mg/L   Creatinine, Urine Waived  200 10 - 300 mg/dL   Microalb/Creat Ratio <30 <30 mg/g  Comprehensive metabolic panel  Result Value Ref Range   Glucose 128 (H) 70 - 99 mg/dL   BUN 16 8 - 27 mg/dL   Creatinine, Ser 1.21 0.76 - 1.27 mg/dL   eGFR 65 >59 mL/min/1.73   BUN/Creatinine Ratio 13 10 - 24   Sodium 140 134 - 144 mmol/L   Potassium 4.0 3.5 - 5.2 mmol/L   Chloride 103 96 - 106 mmol/L   CO2 26 20 - 29 mmol/L   Calcium 9.1 8.6 - 10.2 mg/dL   Total Protein 6.7 6.0 - 8.5 g/dL   Albumin 4.0 3.8 - 4.8 g/dL   Globulin, Total 2.7 1.5 - 4.5 g/dL   Albumin/Globulin Ratio 1.5 1.2 - 2.2   Bilirubin Total 0.3 0.0 - 1.2 mg/dL   Alkaline Phosphatase 159 (H) 44 - 121 IU/L   AST 14 0 - 40 IU/L   ALT 9 0 - 44 IU/L  Lipid Panel w/o Chol/HDL Ratio  Result Value Ref Range   Cholesterol, Total 92 (L) 100 - 199 mg/dL   Triglycerides 80 0 - 149 mg/dL   HDL 32 (L) >39 mg/dL   VLDL Cholesterol Cal 17 5 - 40 mg/dL   LDL Chol Calc (NIH) 43 0 - 99 mg/dL  Bayer DCA Hb A1c Waived  Result Value Ref Range   HB A1C (BAYER DCA - WAIVED) 6.3 (H) 4.8 - 5.6 %      Assessment & Plan:   Problem List Items Addressed This Visit       Cardiovascular and Mediastinum   Aortic atherosclerosis ([place])    Noted on previous imaging. Continue Atorvastatin daily and ASA for prevention. Focus on healthy diet and modest weight loss.       Hypertension associated with diabetes (HCC)    Chronic, ongoing. BP at goal in office today. Recommend he  continue to check BP at home and document for provider visits. Continue current medication regimen and adjust as needed. Labs today: CMP. Return in 6 months.       Relevant Orders   Lipid Panel w/o Chol/HDL Ratio     Respiratory   Sleep apnea    Continues to use CPAP every night and denies any issues.         Digestive   Gastroesophageal reflux disease without esophagitis   Relevant Orders   Magnesium     Endocrine   CKD stage 3 due to type 2 diabetes mellitus (HCC)    Chronic, stable. Recheck  labs today. A1c 6.8% at recent visit with endocrinology. Continue Losartan for kidney protection.      Relevant Orders   Lipid Panel w/o Chol/HDL Ratio   Hyperlipidemia associated with type 2 diabetes mellitus (HCC)    Chronic, ongoing. Continue current medication regimen and adjust as needed. Lipid panel today.      Relevant Orders   Lipid Panel w/o Chol/HDL Ratio   Comprehensive metabolic panel   Type 2 diabetes mellitus with both eyes affected by mild nonproliferative retinopathy without macular edema, with long-term current use of insulin ([place])    Refer to diabetes with proteinuria plan.       Relevant Orders   Lipid Panel w/o Chol/HDL Ratio   Type 2 diabetes mellitus with proteinuria (HCC) - Primary    Chronic, ongoing. Continue collaboration with endocrinology and insulin pump settings per endo. Last visit with endo 11/21/21 with A1c 6.8%. Continue Losartan for kidney protection. Recommend focus on diet and modest weight loss. Return in 6 months.       Relevant Orders   Lipid Panel w/o Chol/HDL Ratio     Other   BMI 40.0-44.9, adult (HCC)    BMI 42.92. Recommend continued focus on modest weight loss, diet changes and regular exercise regimen.       Chronic pain syndrome    Chronic, ongoing to cervical spine -- on Gabapentin. Continue collaboration with pain management and neurosurgery as needed.       Gout    Chronic, stable. Continue current medication regimen and adjust as needed. Uric acid today.       Relevant Orders   Uric acid   History of prostate cancer    Continue collaboration with urology as needed. Check PSA today.       Relevant Orders   PSA   Morbid obesity ([place])    BMI 42.92 with T2DM, HTN.  Recommended eating smaller high protein, low fat meals more frequently and exercising 30 mins a day 5 times a week with a goal of 10-15lb weight loss in the next 3 months. Patient voiced their understanding and motivation to adhere to these recommendations.        Presence of insulin pump    Continue collaboration with endocrinology.       Other Visit Diagnoses     Need for Td vaccine       TD vaccine in office today   Relevant Orders   Td vaccine greater than or equal to 7yo preservative free IM (Completed)   Need for shingles vaccine       Shingrix vaccines ordered   Encounter for annual physical exam       Annual physical with labs today and health maintenance reviewed with patient.       Discussed aspirin prophylaxis for myocardial infarction prevention and decision was made to continue ASA  LABORATORY TESTING:  Health maintenance labs ordered today as discussed above.   The natural history of prostate cancer and ongoing controversy regarding screening and potential treatment outcomes of prostate cancer has been discussed with the patient. The meaning of a false positive PSA and a false negative PSA has been discussed. He indicates understanding of the limitations of this screening test and wishes to proceed with screening PSA testing.   IMMUNIZATIONS:   - Tdap: Tetanus vaccination status reviewed: Td vaccination indicated and given today. - Influenza: Up to date - Pneumovax: Up to date - Prevnar: Up to date - Zostavax vaccine: Administered today  SCREENING: - Colonoscopy: Up to date  Discussed with patient purpose of the colonoscopy is to detect colon cancer at curable precancerous or early stages   - AAA Screening: Up to date  -Hearing Test: Not applicable  -Spirometry: Not applicable   PATIENT COUNSELING:    Sexuality: Discussed sexually transmitted diseases, partner selection, use of condoms, avoidance of unintended pregnancy  and contraceptive alternatives.   Advised to avoid cigarette smoking.  I discussed with the patient that most people either abstain from alcohol or drink within safe limits (<=14/week and <=4 drinks/occasion for males, <=7/weeks and <= 3 drinks/occasion for females) and that the risk for alcohol  disorders and other health effects rises proportionally with the number of drinks per week and how often a drinker exceeds daily limits.  Discussed cessation/primary prevention of drug use and availability of treatment for abuse.   Diet: Encouraged to adjust caloric intake to maintain  or achieve ideal body weight, to reduce intake of dietary saturated fat and total fat, to limit sodium intake by avoiding high sodium foods and not adding table salt, and to maintain adequate dietary potassium and calcium preferably from fresh fruits, vegetables, and low-fat dairy products.    Stressed the importance of regular exercise  Injury prevention: Discussed safety belts, safety helmets, smoke detector, smoking near bedding or upholstery.   Dental health: Discussed importance of regular tooth brushing, flossing, and dental visits.   Follow up plan: NEXT PREVENTATIVE PHYSICAL DUE IN 1 YEAR. Return in about 6 months (around 05/26/2022) for T2DM, HTN/HLD, GERD, PAIN, CKD.

## 2021-11-23 NOTE — Assessment & Plan Note (Signed)
Chronic, stable. Recheck labs today. A1c 6.8% at recent visit with endocrinology. Continue Losartan for kidney protection.

## 2021-11-23 NOTE — Assessment & Plan Note (Addendum)
BMI 42.92 with T2DM, HTN.  Recommended eating smaller high protein, low fat meals more frequently and exercising 30 mins a day 5 times a week with a goal of 10-15lb weight loss in the next 3 months. Patient voiced their understanding and motivation to adhere to these recommendations.

## 2021-11-23 NOTE — Assessment & Plan Note (Signed)
Chronic, ongoing to cervical spine -- on Gabapentin. Continue collaboration with pain management and neurosurgery as needed.

## 2021-11-23 NOTE — Assessment & Plan Note (Signed)
Noted on previous imaging. Continue Atorvastatin daily and ASA for prevention. Focus on healthy diet and modest weight loss.

## 2021-11-23 NOTE — Assessment & Plan Note (Signed)
Continues to use CPAP every night and denies any issues.

## 2021-11-23 NOTE — Assessment & Plan Note (Signed)
Chronic, ongoing.  Continue current medication regimen and adjust as needed. Lipid panel today. 

## 2021-11-23 NOTE — Assessment & Plan Note (Signed)
Chronic, stable. Continue current medication regimen and adjust as needed. Uric acid today.

## 2021-11-23 NOTE — Assessment & Plan Note (Signed)
Chronic, ongoing. Continue collaboration with endocrinology and insulin pump settings per endo. Last visit with endo 11/21/21 with A1c 6.8%. Continue Losartan for kidney protection. Recommend focus on diet and modest weight loss. Return in 6 months.

## 2021-11-23 NOTE — Assessment & Plan Note (Signed)
Continue collaboration with endocrinology.

## 2021-11-23 NOTE — Progress Notes (Deleted)
Ht 6' 1.5" (1.867 m)   Wt (!) 329 lb 12.8 oz (149.6 kg)   BMI 42.92 kg/m    Subjective:    Patient ID: Aaron [name], male    DOB: [birth year], 71 y.o.   MRN: [id number]  HPI: Aaron [name] is a 71 y.o. male presenting on 11/23/2021 for comprehensive medical examination. Current medical complaints include:{Blank single:19197::"none","***"}  He currently lives with: Interim Problems from his last visit: {Blank single:19197::"yes","no"}  DIABETES Followed by Dr. Gabriel Carina with last visit 11/21/21 with 6.8% A1c.   Uses Tandem T:slim insulin pump (04/2020 placed), per Dr. Gabriel Carina last note 04/04/21: "Basal rates Correction factor Carb ratio Target BG 12 am 2.45 units/hr 20 8 110 4 am 1.85 units/hr 20 8 110 8:30 am 1.1 units/hr 20 8 110 11 am 1.5 units.hr 20 6 110 8 pm 2.2 units/hr 20 6 110  24-hr basal = 43.175 units Humalog U200 insulin (equals 86.35 units of U100 insulin)" Hypoglycemic episodes:{Blank single:19197::"yes","no"} Polydipsia/polyuria: {Blank single:19197::"yes","no"} Visual disturbance: {Blank single:19197::"yes","no"} Chest pain: {Blank single:19197::"yes","no"} Paresthesias: {Blank single:19197::"yes","no"} Glucose Monitoring: {Blank single:19197::"yes","no"}  Accucheck frequency: {Blank single:19197::"Not Checking","Daily","BID","TID"}  Fasting glucose:  Post prandial:  Evening:  Before meals: Taking Insulin?: {Blank single:19197::"yes","no"}  Long acting insulin:  Short acting insulin: Blood Pressure Monitoring: {Blank single:19197::"not checking","rarely","daily","weekly","monthly","a few times a day","a few times a week","a few times a month"} Retinal Examination: {Blank single:19197::"Up to Date","Not up to Date"} Foot Exam: {Blank single:19197::"Up to Date","Not up to Date"} Diabetic Education: {Blank single:19197::"Completed","Not Completed"} Pneumovax: {Blank single:19197::"Up to Date","Not up to Date","unknown"} Influenza: {Blank single:19197::"Up to  Date","Not up to Date","unknown"} Aspirin: {Blank single:19197::"yes","no"}   HYPERTENSION / HYPERLIPIDEMIA Continues on Losartan 100 MG, HCTZ 25 MG daily, Carvedilol 25 MG BID, Amlodipine 10 MG daily.  Takes Lipitor 20 MG.     Has OSA and uses CPAP every night.  History on 09/16/19 imaging aortic atherosclerosis noted. Satisfied with current treatment? {Blank single:19197::"yes","no"} Duration of hypertension: {Blank single:19197::"chronic","months","years"} BP monitoring frequency: {Blank single:19197::"not checking","rarely","daily","weekly","monthly","a few times a day","a few times a week","a few times a month"} BP range:  BP medication side effects: {Blank single:19197::"yes","no"} Duration of hyperlipidemia: {Blank single:19197::"chronic","months","years"} Cholesterol medication side effects: {Blank single:19197::"yes","no"} Cholesterol supplements: {Blank multiple:19196::"none","fish oil","niacin","red yeast rice"} Medication compliance: {Blank single:19197::"excellent compliance","good compliance","fair compliance","poor compliance"} Aspirin: {Blank single:19197::"yes","no"} Recent stressors: {Blank single:19197::"yes","no"} Recurrent headaches: {Blank single:19197::"yes","no"} Visual changes: {Blank single:19197::"yes","no"} Palpitations: {Blank single:19197::"yes","no"} Dyspnea: {Blank single:19197::"yes","no"} Chest pain: {Blank single:19197::"yes","no"} Lower extremity edema: {Blank single:19197::"yes","no"} Dizzy/lightheaded: {Blank single:19197::"yes","no"}   CHRONIC KIDNEY DISEASE Stable on recent labs.  History of prostate cancer with removal years ago and table PSA ongoing. CKD status: {Blank single:19197::"controlled","uncontrolled","better","worse","exacerbated","stable"} Medications renally dose: {Blank single:19197::"yes","no"} Previous renal evaluation: {Blank single:19197::"yes","no"} Pneumovax:  {Blank single:19197::"Up to Date","Not up to  Date","unknown"} Influenza Vaccine:  {Blank single:19197::"Up to Date","Not up to Date","unknown"}   GOUT Continues on Allopurinol.  Continues on Protonix daily for GERD. Duration:{Blank single:19197::"chronic","days","weeks","months"} Swelling: {Blank single:19197::"yes","no"} Redness: {Blank single:19197::"yes","no"} Trauma: {Blank single:19197::"yes","no"} Recent dietary change or indiscretion: {Blank single:19197::"yes","no"} Fevers: {Blank single:19197::"yes","no"} Nausea/vomiting: {Blank single:19197::"yes","no"} Aggravating factors: Alleviating factors:  Status:  {Blank multiple:19196::"better","worse","stable","fluctuating"} Treatments attempted:     Functional Status Survey: Is the patient deaf or have difficulty hearing?: No Does the patient have difficulty seeing, even when wearing glasses/contacts?: No Does the patient have difficulty concentrating, remembering, or making decisions?: No Does the patient have difficulty walking or climbing stairs?: No Does the patient have difficulty dressing or bathing?: No Does the patient have difficulty doing errands alone such as visiting a doctor's office or shopping?: No  FALL  RISK:    02/13/2021   10:41 AM 11/09/2020    8:24 AM 07/15/2020    8:29 AM 06/03/2020    8:39 AM 05/04/2020    9:29 AM  Fall Risk   Falls in the past year? 0 0 0 0 0  Number falls in past yr: 0    0  Injury with Fall? 0    0  Risk for fall due to : No Fall Risks      Follow up Falls evaluation completed    Falls evaluation completed    Depression Screen    08/04/2021    8:34 AM 05/06/2021   10:26 AM 02/13/2021   10:42 AM 02/13/2021   10:41 AM 11/04/2020    9:02 AM  Depression screen PHQ 2/9  Decreased Interest 0 0 0 0 0  Down, Depressed, Hopeless 0 0 0 0 0  PHQ - 2 Score 0 0 0 0 0  Altered sleeping 0 0 0    Tired, decreased energy 0 0 0    Change in appetite 0 0 0    Feeling bad or failure about yourself  0 0 0    Trouble concentrating 0 0 0     Moving slowly or fidgety/restless 0 0 0    Suicidal thoughts 0 0 0    PHQ-9 Score 0 0 0    Difficult doing work/chores   Not difficult at all      Advanced Directives <no information>  Past Medical History:  Past Medical History:  Diagnosis Date   Cancer ([place]) 2013   prostate (radiation and surgery)   Diabetes mellitus without complication ([place])    Gout    Hyperlipidemia    Hypertension    Sleep apnea    Spinal stenosis in cervical region 06/03/2020    Surgical History:  Past Surgical History:  Procedure Laterality Date   CIRCUMCISION     COLONOSCOPY N/A 06/22/2021   Procedure: COLONOSCOPY;  Surgeon: Toledo, Benay Pike, MD;  Location: [facility] ENDOSCOPY;  Service: Gastroenterology;  Laterality: N/A;  IDDM   CYST EXCISION Left 2015   arm   deviated septrum  2003   JOINT REPLACEMENT Left 2016   Hip   PROSTATE SURGERY     prostatectomy due to cancer   PROSTATECTOMY  2013   TONSILLECTOMY AND ADENOIDECTOMY  2004   TOTAL HIP ARTHROPLASTY Left 2016   TOTAL HIP ARTHROPLASTY Right 11/27/2017   Procedure: TOTAL HIP ARTHROPLASTY ANTERIOR APPROACH;  Surgeon: Hessie Knows, MD;  Location: [facility] ORS;  Service: Orthopedics;  Laterality: Right;    Medications:  Current Outpatient Medications on File Prior to Visit  Medication Sig   ACCU-CHEK GUIDE test strip    allopurinol (ZYLOPRIM) 300 MG tablet Take 1 tablet (300 mg total) by mouth daily.   amLODipine (NORVASC) 10 MG tablet Take 1 tablet (10 mg total) by mouth daily.   ASPIRIN 81 PO every evening.    atorvastatin (LIPITOR) 20 MG tablet Take 1 tablet (20 mg total) by mouth daily.   carvedilol (COREG) 25 MG tablet TAKE 1 TABLET TWICE DAILY WITH A MEAL   gabapentin (NEURONTIN) 600 MG tablet Take 1-1.5 tablets (600-900 mg total) by mouth at bedtime.   hydrochlorothiazide (HYDRODIURIL) 25 MG tablet Take 1 tablet (25 mg total) by mouth daily.   insulin aspart (NOVOLOG) 100 UNIT/ML injection Inject into the skin. Using via insulin pump   Medtronic MiniMed 630G insulin pump   Insulin Human (INSULIN PUMP) SOLN Inject into the skin.  insulin lispro (HUMALOG KWIKPEN) 200 UNIT/ML KwikPen Inject into the skin.   losartan (COZAAR) 100 MG tablet Take 1 tablet (100 mg total) by mouth daily.   pantoprazole (PROTONIX) 20 MG tablet TAKE 1 TABLET(20 MG) BY MOUTH EVERY DAY   tamsulosin (FLOMAX) 0.4 MG CAPS capsule Take 1 capsule (0.4 mg total) by mouth daily.   No current facility-administered medications on file prior to visit.    Allergies:  Allergies  Allergen Reactions   Glucophage [Metformin] Diarrhea    Social History:  Social History   Socioeconomic History   Marital status: Married    Spouse name: Not on file   Number of children: Not on file   Years of education: Not on file   Highest education level: Some college, no degree  Occupational History   Occupation: retired  Tobacco Use   Smoking status: Never   Smokeless tobacco: Never  Vaping Use   Vaping Use: Never used  Substance and Sexual Activity   Alcohol use: No   Drug use: No   Sexual activity: Yes  Other Topics Concern   Not on file  Social History Narrative   Not on file   Social Determinants of Health   Financial Resource Strain: Low Risk  (02/13/2021)   Overall Financial Resource Strain (CARDIA)    Difficulty of Paying Living Expenses: Not hard at all  Food Insecurity: No Food Insecurity (06/27/2021)   Hunger Vital Sign    Worried About Running Out of Food in the Last Year: Never true    [place] in the Last Year: Never true  Transportation Needs: No Transportation Needs (06/27/2021)   PRAPARE - Hydrologist (Medical): No    Lack of Transportation (Non-Medical): No  Physical Activity: Inactive (02/13/2021)   Exercise Vital Sign    Days of Exercise per Week: 0 days    Minutes of Exercise per Session: 0 min  Stress: No Stress Concern Present (02/13/2021)   [place]    Feeling of Stress : Not at all  Social Connections: Moderately Integrated (02/13/2021)   Social Connection and Isolation Panel [NHANES]    Frequency of Communication with Friends and Family: Twice a week    Frequency of Social Gatherings with Friends and Family: Twice a week    Attends Religious Services: Never    Marine scientist or Organizations: No    Attends Music therapist: 1 to 4 times per year    Marital Status: Married  Human resources officer Violence: Not At Risk (02/13/2021)   Humiliation, Afraid, Rape, and Kick questionnaire    Fear of Current or Ex-Partner: No    Emotionally Abused: No    Physically Abused: No    Sexually Abused: No   Social History   Tobacco Use  Smoking Status Never  Smokeless Tobacco Never   Social History   Substance and Sexual Activity  Alcohol Use No    Family History:  Family History  Problem Relation Age of Onset   Cancer Mother    Breast cancer Mother    Stroke Father    Cancer Sister    Cancer Maternal Uncle    Stroke Maternal Uncle     Past medical history, surgical history, medications, allergies, family history and social history reviewed with patient today and changes made to appropriate areas of the chart.   ROS All other ROS negative except what is listed above  and in the HPI.      Objective:    Ht 6' 1.5" (1.867 m)   Wt (!) 329 lb 12.8 oz (149.6 kg)   BMI 42.92 kg/m   Wt Readings from Last 3 Encounters:  11/23/21 (!) 329 lb 12.8 oz (149.6 kg)  08/04/21 (!) 325 lb 9.6 oz (147.7 kg)  07/20/21 (!) 325 lb (147.4 kg)     Physical Exam     02/13/2021   10:39 AM 10/23/2018    9:51 AM  6CIT Screen  What Year? 0 points 0 points  What month? 0 points 0 points  What time? 0 points 0 points  Count back from 20 0 points 0 points  Months in reverse 0 points 0 points  Repeat phrase 0 points 0 points  Total Score 0 points 0 points    Results for orders placed or performed in visit  on 08/04/21  Microalbumin, Urine Waived  Result Value Ref Range   Microalb, Ur Waived 10 0 - 19 mg/L   Creatinine, Urine Waived 200 10 - 300 mg/dL   Microalb/Creat Ratio <30 <30 mg/g  Comprehensive metabolic panel  Result Value Ref Range   Glucose 128 (H) 70 - 99 mg/dL   BUN 16 8 - 27 mg/dL   Creatinine, Ser 1.21 0.76 - 1.27 mg/dL   eGFR 65 >59 mL/min/1.73   BUN/Creatinine Ratio 13 10 - 24   Sodium 140 134 - 144 mmol/L   Potassium 4.0 3.5 - 5.2 mmol/L   Chloride 103 96 - 106 mmol/L   CO2 26 20 - 29 mmol/L   Calcium 9.1 8.6 - 10.2 mg/dL   Total Protein 6.7 6.0 - 8.5 g/dL   Albumin 4.0 3.8 - 4.8 g/dL   Globulin, Total 2.7 1.5 - 4.5 g/dL   Albumin/Globulin Ratio 1.5 1.2 - 2.2   Bilirubin Total 0.3 0.0 - 1.2 mg/dL   Alkaline Phosphatase 159 (H) 44 - 121 IU/L   AST 14 0 - 40 IU/L   ALT 9 0 - 44 IU/L  Lipid Panel w/o Chol/HDL Ratio  Result Value Ref Range   Cholesterol, Total 92 (L) 100 - 199 mg/dL   Triglycerides 80 0 - 149 mg/dL   HDL 32 (L) >39 mg/dL   VLDL Cholesterol Cal 17 5 - 40 mg/dL   LDL Chol Calc (NIH) 43 0 - 99 mg/dL  Bayer DCA Hb A1c Waived  Result Value Ref Range   HB A1C (BAYER DCA - WAIVED) 6.3 (H) 4.8 - 5.6 %      Assessment & Plan:   Problem List Items Addressed This Visit       Cardiovascular and Mediastinum   Aortic atherosclerosis ([place])   Hypertension associated with diabetes ([place])   Relevant Orders   Lipid Panel w/o Chol/HDL Ratio     Digestive   Gastroesophageal reflux disease without esophagitis   Relevant Orders   Magnesium     Endocrine   CKD stage 3 due to type 2 diabetes mellitus (HCC)   Relevant Orders   Lipid Panel w/o Chol/HDL Ratio   Hyperlipidemia associated with type 2 diabetes mellitus (HCC)   Relevant Orders   Lipid Panel w/o Chol/HDL Ratio   Comprehensive metabolic panel   Type 2 diabetes mellitus with both eyes affected by mild nonproliferative retinopathy without macular edema, with long-term current use of insulin (HCC)    Relevant Orders   Lipid Panel w/o Chol/HDL Ratio   Type 2 diabetes mellitus with proteinuria (HCC) - Primary  Relevant Orders   Lipid Panel w/o Chol/HDL Ratio     Other   BMI 40.0-44.9, adult (HCC)   Chronic pain syndrome   Gout   Relevant Orders   Uric acid   History of prostate cancer   Relevant Orders   PSA   Morbid obesity (HCC)   Presence of insulin pump   Other Visit Diagnoses     Encounter for annual physical exam           Discussed aspirin prophylaxis for myocardial infarction prevention and decision was {Blank single:19197::"it was not indicated","made to continue ASA","made to start ASA","made to stop ASA","that we recommended ASA, and patient refused"}  LABORATORY TESTING:  Health maintenance labs ordered today as discussed above.   The natural history of prostate cancer and ongoing controversy regarding screening and potential treatment outcomes of prostate cancer has been discussed with the patient. The meaning of a false positive PSA and a false negative PSA has been discussed. He indicates understanding of the limitations of this screening test and wishes *** to proceed with screening PSA testing.   IMMUNIZATIONS:   - Tdap: Tetanus vaccination status reviewed: {tetanus status:315746}. - Influenza: {Blank single:19197::"Up to date","Administered today","Postponed to flu season","Refused","Given elsewhere"} - Pneumovax: {Blank single:19197::"Up to date","Administered today","Not applicable","Refused","Given elsewhere"} - Prevnar: {Blank single:19197::"Up to date","Administered today","Not applicable","Refused","Given elsewhere"} - Zostavax vaccine: {Blank single:19197::"Up to date","Administered today","Not applicable","Refused","Given elsewhere"}  SCREENING: - Colonoscopy: {Blank single:19197::"Up to date","Ordered today","Not applicable","Refused","Done elsewhere"}  Discussed with patient purpose of the colonoscopy is to detect colon cancer at curable  precancerous or early stages   - AAA Screening: {Blank single:19197::"Up to date","Ordered today","Not applicable","Refused","Done elsewhere"}  -Hearing Test: {Blank single:19197::"Up to date","Ordered today","Not applicable","Refused","Done elsewhere"}  -Spirometry: {Blank single:19197::"Up to date","Ordered today","Not applicable","Refused","Done elsewhere"}   PATIENT COUNSELING:    Sexuality: Discussed sexually transmitted diseases, partner selection, use of condoms, avoidance of unintended pregnancy  and contraceptive alternatives.   Advised to avoid cigarette smoking.  I discussed with the patient that most people either abstain from alcohol or drink within safe limits (<=14/week and <=4 drinks/occasion for males, <=7/weeks and <= 3 drinks/occasion for females) and that the risk for alcohol disorders and other health effects rises proportionally with the number of drinks per week and how often a drinker exceeds daily limits.  Discussed cessation/primary prevention of drug use and availability of treatment for abuse.   Diet: Encouraged to adjust caloric intake to maintain  or achieve ideal body weight, to reduce intake of dietary saturated fat and total fat, to limit sodium intake by avoiding high sodium foods and not adding table salt, and to maintain adequate dietary potassium and calcium preferably from fresh fruits, vegetables, and low-fat dairy products.    Stressed the importance of regular exercise  Injury prevention: Discussed safety belts, safety helmets, smoke detector, smoking near bedding or upholstery.   Dental health: Discussed importance of regular tooth brushing, flossing, and dental visits.   Follow up plan: NEXT PREVENTATIVE PHYSICAL DUE IN 1 YEAR. No follow-ups on file.

## 2021-11-24 LAB — LIPID PANEL W/O CHOL/HDL RATIO
Cholesterol, Total: 99 mg/dL — ABNORMAL LOW (ref 100–199)
HDL: 33 mg/dL — ABNORMAL LOW (ref >39)
LDL Chol Calc (NIH): 50 mg/dL (ref 0–99)
Triglycerides: 80 mg/dL (ref 0–149)
VLDL Cholesterol Cal: 16 mg/dL (ref 5–40)

## 2021-11-24 LAB — COMPREHENSIVE METABOLIC PANEL
ALT: 10 IU/L (ref 0–44)
AST: 11 IU/L (ref 0–40)
Albumin/Globulin Ratio: 1.5 (ref 1.2–2.2)
Albumin: 4.2 g/dL (ref 3.8–4.8)
Alkaline Phosphatase: 171 IU/L — ABNORMAL HIGH (ref 44–121)
BUN/Creatinine Ratio: 12 (ref 10–24)
BUN: 14 mg/dL (ref 8–27)
Bilirubin Total: 0.4 mg/dL (ref 0.0–1.2)
CO2: 29 mmol/L (ref 20–29)
Calcium: 9.5 mg/dL (ref 8.6–10.2)
Chloride: 100 mmol/L (ref 96–106)
Creatinine, Ser: 1.19 mg/dL (ref 0.76–1.27)
Globulin, Total: 2.8 g/dL (ref 1.5–4.5)
Glucose: 61 mg/dL — ABNORMAL LOW (ref 70–99)
Potassium: 3.6 mmol/L (ref 3.5–5.2)
Sodium: 140 mmol/L (ref 134–144)
Total Protein: 7 g/dL (ref 6.0–8.5)
eGFR: 66 mL/min/{1.73_m2} (ref >59)

## 2021-11-24 LAB — MAGNESIUM: Magnesium: 2.2 mg/dL (ref 1.6–2.3)

## 2021-11-24 LAB — URIC ACID: Uric Acid: 5 mg/dL (ref 3.8–8.4)

## 2021-11-24 LAB — PSA: Prostate Specific Ag, Serum: <0.1 ng/mL (ref 0.0–4.0)

## 2021-11-24 NOTE — Progress Notes (Signed)
Contacted via [place] afternoon [place], your labs have returned: - Cholesterol levels remain at goal, continue Atorvastatin. - Kidney function, creatinine and eGFR, remains normal, as is liver function, AST and ALT.  Alkaline phosphatase remains elevated, we will continue to monitor this. - Remainder of labs are all stable.  Any questions? Keep being stellar!!  Thank you for allowing me to participate in your care.  I appreciate you. Kindest regards, Dareon Nunziato

## 2022-01-02 ENCOUNTER — Ambulatory Visit (INDEPENDENT_AMBULATORY_CARE_PROVIDER_SITE_OTHER): Payer: Medicare HMO

## 2022-01-02 DIAGNOSIS — E1169 Type 2 diabetes mellitus with other specified complication: Secondary | ICD-10-CM

## 2022-01-02 DIAGNOSIS — E1129 Type 2 diabetes mellitus with other diabetic kidney complication: Secondary | ICD-10-CM

## 2022-01-02 DIAGNOSIS — E1159 Type 2 diabetes mellitus with other circulatory complications: Secondary | ICD-10-CM

## 2022-01-02 NOTE — Patient Instructions (Signed)
Visit Information   Goals Addressed   None    Patient Care Plan: CCM Pharmacy Care Plan     Problem Identified: CAD, DM2, HTN, HLD, Chronic pain 2/2 cervical spinal stenosis, gout, morbid obesity, OSA, h/o prostate cancer   Priority: High     Long-Range Goal: Disease Management   Start Date: 06/27/2021  Expected End Date: 06/27/2022  Recent Progress: On track  Priority: High  Note:    Hypertension (BP goal <130/80) -Controlled -Current treatment: Coreg - Appropriate, Effective, Safe, Accessible Amlodipine - Appropriate, Effective, Safe, Accessible HCTZ - Appropriate, Effective, Safe, Accessible Losartan - Appropriate, Effective, Safe, Accessible -Medications previously tried: n/a  -Current home readings: hasnt checked recently reports 443X systolic when getting colonscopy recently  -Current dietary habits: biggest thing has been trying to cut down on carbs, especially bread intake.  -Denies hypotensive/hypertensive symptoms -Educated on BP goals and benefits of medications for prevention of heart attack, stroke and kidney damage; -Counseled to monitor BP at home 1-2x/wk, document, and provide log at future appointments -Counseled on diet and exercise extensively Recommended to continue current medication  Hyperlipidemia: (LDL goal < 55) -Controlled -Highest LDL on record 78  -Current treatment: Atorvastatin 20 mg once daily Appropriate, Effective, Safe, Accessible -Reviewed tolerability, side effects - no issues noted.  -Current dietary patterns: see HTN -Current exercise habits: n/a -Educated on Cholesterol goals;  -Counseled on diet and exercise extensively Recommended to continue current medication August 2023: per 11/23/21 note, patient at goal  Diabetes (A1c goal <7%) -Controlled -Sees endo.  -Current medications: Humalog Kwikpen -Current home glucose readings fasting glucose: 80s-130s post prandial glucose: none provided. Is set up with Dexcom G6 CGM.  -Denies  hypoglycemic/hyperglycemic symptoms -Current meal patterns: ongoing effort to cut down on carb rich foods, especially white bread (4 slices per day currently) -Current exercise: did not review -Educated on A1c and blood sugar goals; Prevention and management of hypoglycemic episodes; Benefits of routine self-monitoring of blood sugar; -Counseled to check feet daily and get yearly eye exams -Counseled on diet and exercise extensively Recommended to continue current medication Assessed patient finances. Denies any issues.  Medication Assistance: None required.  Patient affirms current coverage meets needs.  CPP f/u PRN      Aaron [name] was given information about Chronic Care Management services today including:  CCM service includes personalized support from designated clinical staff supervised by his physician, including individualized plan of care and coordination with other care providers 24/7 contact phone numbers for assistance for urgent and routine care needs. Standard insurance, coinsurance, copays and deductibles apply for chronic care management only during months in which we provide at least 20 minutes of these services. Most insurances cover these services at 100%, however patients may be responsible for any copay, coinsurance and/or deductible if applicable. This service may help you avoid the need for more expensive face-to-face services. Only one practitioner may furnish and bill the service in a calendar month. The patient may stop CCM services at any time (effective at the end of the month) by phone call to the office staff.  Patient agreed to services and verbal consent obtained.   The patient verbalized understanding of instructions, educational materials, and care plan provided today and DECLINED offer to receive copy of patient instructions, educational materials, and care plan.  CPP F/U PRN  Lane Hacker, [place]

## 2022-01-02 NOTE — Progress Notes (Signed)
Chronic Care Management Pharmacy Note  01/02/2022 Name:  Aaron [name] MRN:  [id number] DOB:  06/19/1950  Summary: --Patient was working and wasn't interested in f/u with CPP. Move to self-care  Subjective: Aaron [name] is an 71 y.o. year old male who is a primary patient of Cannady, Barbaraann Faster, NP.  The CCM team was consulted for assistance with disease management and care coordination needs.    Engaged with patient by telephone for follow up visit in response to provider referral for pharmacy case management and/or care coordination services.   Consent to Services:  The patient was given information about Chronic Care Management services, agreed to services, and gave verbal consent prior to initiation of services.  Please see initial visit note for detailed documentation.   Patient Care Team: Venita Lick, NP as PCP - General (Nurse Practitioner) Judi Cong, MD as Consulting Physician (Endocrinology) Lane Hacker, [facility] (Pharmacist)  Objective:  Lab Results  Component Value Date   CREATININE 1.19 11/23/2021   CREATININE 1.21 08/04/2021   CREATININE 1.24 02/04/2021   Lab Results  Component Value Date   HGBA1C 6.3 (H) 08/04/2021   Last diabetic Eye exam:  Lab Results  Component Value Date/Time   HMDIABEYEEXA Retinopathy (A) 02/28/2021 12:00 AM    Last diabetic Foot exam: No results found for: "HMDIABFOOTEX"      Component Value Date/Time   CHOL 99 (L) 11/23/2021 0951   CHOL 78 03/21/2018 1103   TRIG 80 11/23/2021 0951   TRIG 67 03/21/2018 1103   HDL 33 (L) 11/23/2021 0951   CHOLHDL 3.1 08/15/2017 0858   VLDL 13 03/21/2018 1103   LDLCALC 50 11/23/2021 0951      Latest Ref Rng & Units 11/23/2021    9:51 AM 08/04/2021    8:42 AM 02/04/2021   10:10 AM  Hepatic Function  Total Protein 6.0 - 8.5 g/dL 7.0  6.7  6.9   Albumin 3.8 - 4.8 g/dL 4.2  4.0  4.2   AST 0 - 40 IU/L 11  14  13    ALT 0 - 44 IU/L 10  9  10    Alk Phosphatase 44 - 121 IU/L 171   159  151   Total Bilirubin 0.0 - 1.2 mg/dL 0.4  0.3  0.4    Lab Results  Component Value Date/Time   TSH 3.670 11/04/2020 09:07 AM   TSH 2.610 08/04/2020 09:15 AM       Latest Ref Rng & Units 09/08/2019    2:28 PM 11/30/2017    3:55 AM 11/29/2017    4:54 AM  CBC  WBC 3.4 - 10.8 x10E3/uL 9.5  13.1  15.0   Hemoglobin 13.0 - 17.7 g/dL 13.5  10.2  11.2   Hematocrit 37.5 - 51.0 % 40.8  30.3  33.4   Platelets 150 - 450 x10E3/uL 285  185  199    No results found for: "VD25OH"  Clinical ASCVD:  The ASCVD Risk score (Arnett DK, et al., 2019) failed to calculate for the following reasons:   The valid total cholesterol range is 130 to 320 mg/dL    Social History   Tobacco Use  Smoking Status Never  Smokeless Tobacco Never   BP Readings from Last 3 Encounters:  11/23/21 126/64  08/04/21 132/70  07/20/21 131/77   Pulse Readings from Last 3 Encounters:  11/23/21 61  08/04/21 (!) 58  07/20/21 79   Wt Readings from Last 3 Encounters:  11/23/21 Marland Kitchen)  329 lb 12.8 oz (149.6 kg)  08/04/21 (!) 325 lb 9.6 oz (147.7 kg)  07/20/21 (!) 325 lb (147.4 kg)    Assessment: Review of patient past medical history, allergies, medications, health status, including review of consultants reports, laboratory and other test data, was performed as part of comprehensive evaluation and provision of chronic care management services.   SDOH:  (Social Determinants of Health) assessments and interventions performed: Yes. SDOH Interventions    Flowsheet Row Most Recent Value  SDOH Interventions   Financial Strain Interventions Intervention Not Indicated  Transportation Interventions Intervention Not Indicated       CCM Care Plan  Allergies  Allergen Reactions   Glucophage [Metformin] Diarrhea    Medications Reviewed Today     Reviewed by Lane Hacker, [facility] (Pharmacist) on 01/02/22 at 0858  Med List Status: <None>   Medication Order Taking? Sig Documenting Provider Last Dose Status Informant   ACCU-CHEK GUIDE test strip [id number] No  [provider] Taking Active   allopurinol (ZYLOPRIM) 300 MG tablet [id number] No Take 1 tablet (300 mg total) by mouth daily. Marnee Guarneri T, NP Taking Active   amLODipine (NORVASC) 10 MG tablet [id number] No Take 1 tablet (10 mg total) by mouth daily. Marnee Guarneri T, NP Taking Active   ASPIRIN 81 PO [id number] No every evening.  [provider] Taking Active   atorvastatin (LIPITOR) 20 MG tablet [id number] No Take 1 tablet (20 mg total) by mouth daily. Marnee Guarneri T, NP Taking Active   carvedilol (COREG) 25 MG tablet [id number] No TAKE 1 TABLET TWICE DAILY WITH A MEAL Cannady, Jolene T, NP Taking Active   gabapentin (NEURONTIN) 600 MG tablet [id number] No Take 1-1.5 tablets (600-900 mg total) by mouth at bedtime. Marnee Guarneri T, NP Taking Active   hydrochlorothiazide (HYDRODIURIL) 25 MG tablet [id number] No Take 1 tablet (25 mg total) by mouth daily. Marnee Guarneri T, NP Taking Active   insulin aspart (NOVOLOG) 100 UNIT/ML injection [id number] No Inject into the skin. Using via insulin pump  Medtronic MiniMed 630G insulin pump [provider] Taking Active   Insulin Human (INSULIN PUMP) SOLN [id number] No Inject into the skin. [provider] Taking Active   insulin lispro (HUMALOG KWIKPEN) 200 UNIT/ML KwikPen [id number] No Inject into the skin. [provider] Taking Active   losartan (COZAAR) 100 MG tablet [id number] No Take 1 tablet (100 mg total) by mouth daily. Marnee Guarneri T, NP Taking Active   pantoprazole (PROTONIX) 20 MG tablet [id number] No TAKE 1 TABLET(20 MG) BY MOUTH EVERY DAY [provider] Taking Active   tamsulosin (FLOMAX) 0.4 MG CAPS capsule [id number] No Take 1 capsule (0.4 mg total) by mouth daily. Marnee Guarneri T, NP Taking Active   Zoster Vaccine Adjuvanted [facility]) injection [id number]  Inject 0.5 mLs into the muscle once for 1 dose. Dose # 2 Venita Lick, NP  Active              Patient Active Problem List   Diagnosis Date Noted   Gastroesophageal reflux disease without esophagitis 11/23/2021   Cervical spondylosis 11/09/2020   CKD stage 3 due to type 2 diabetes mellitus ([place]) 11/05/2020   Chronic pain syndrome 06/03/2020   Type 2 diabetes mellitus with both eyes affected by mild nonproliferative retinopathy without macular edema, with long-term current use of insulin ([place]) 05/01/2020   Aortic atherosclerosis ([place]) 05/01/2020   Type 2 diabetes mellitus with proteinuria ([place]) 01/18/2018   Morbid obesity ([place]) 01/18/2018   Presence  of insulin pump 01/18/2018   Status post total hip replacement, right 11/27/2017   Advanced care planning/counseling discussion 08/15/2017   Spinal stenosis, cervical region 02/15/2017   Sleep apnea 04/26/2015   Gout 04/26/2015   History of prostate cancer 04/26/2015   BMI 40.0-44.9, adult ([place]) 04/26/2015   Hyperlipidemia associated with type 2 diabetes mellitus ([place])    Hypertension associated with diabetes ([place])     Immunization History  Administered Date(s) Administered   Fluad Quad(high Dose 65+) 03/28/2019, 02/03/2020, 02/04/2021   Influenza, High Dose Seasonal PF 03/29/2018   Influenza,inj,Quad PF,6+ Mos 07/11/2016, 02/15/2017   Influenza-Unspecified 03/26/2015   PFIZER(Purple Top)SARS-COV-2 Vaccination 08/05/2019, 08/26/2019, 02/25/2020   Pneumococcal Conjugate-13 03/21/2018   Pneumococcal Polysaccharide-23 07/02/2019   Pneumococcal-Unspecified 10/20/1992, 02/20/1999   Td 11/23/2021   Tdap 05/31/2011   Zoster, Live 07/11/2016    Conditions to be addressed/monitored: HLD HTN DM Obesity Gout Sleep Apnea  Care Plan : [place]  Updates made by Lane Hacker, RPH since 01/02/2022 12:00 AM     Problem: CAD, DM2, HTN, HLD, Chronic pain 2/2 cervical spinal stenosis, gout, morbid obesity, OSA, h/o prostate cancer   Priority: High     Long-Range Goal: Disease Management   Start Date:  06/27/2021  Expected End Date: 06/27/2022  Recent Progress: On track  Priority: High  Note:    Hypertension (BP goal <130/80) -Controlled -Current treatment: Coreg - Appropriate, Effective, Safe, Accessible Amlodipine - Appropriate, Effective, Safe, Accessible HCTZ - Appropriate, Effective, Safe, Accessible Losartan - Appropriate, Effective, Safe, Accessible -Medications previously tried: n/a  -Current home readings: hasnt checked recently reports 867Y systolic when getting colonscopy recently  -Current dietary habits: biggest thing has been trying to cut down on carbs, especially bread intake.  -Denies hypotensive/hypertensive symptoms -Educated on BP goals and benefits of medications for prevention of heart attack, stroke and kidney damage; -Counseled to monitor BP at home 1-2x/wk, document, and provide log at future appointments -Counseled on diet and exercise extensively Recommended to continue current medication  Hyperlipidemia: (LDL goal < 55) -Controlled -Highest LDL on record 78  -Current treatment: Atorvastatin 20 mg once daily Appropriate, Effective, Safe, Accessible -Reviewed tolerability, side effects - no issues noted.  -Current dietary patterns: see HTN -Current exercise habits: n/a -Educated on Cholesterol goals;  -Counseled on diet and exercise extensively Recommended to continue current medication August 2023: per 11/23/21 note, patient at goal  Diabetes (A1c goal <7%) -Controlled -Sees endo.  -Current medications: Humalog Kwikpen -Current home glucose readings fasting glucose: 80s-130s post prandial glucose: none provided. Is set up with Dexcom G6 CGM.  -Denies hypoglycemic/hyperglycemic symptoms -Current meal patterns: ongoing effort to cut down on carb rich foods, especially white bread (4 slices per day currently) -Current exercise: did not review -Educated on A1c and blood sugar goals; Prevention and management of hypoglycemic episodes; Benefits of  routine self-monitoring of blood sugar; -Counseled to check feet daily and get yearly eye exams -Counseled on diet and exercise extensively Recommended to continue current medication Assessed patient finances. Denies any issues.  Medication Assistance: None required.  Patient affirms current coverage meets needs.  CPP f/u PRN      Patient's preferred pharmacy is:  [id number] [address], [address] [place] [address] Phone: [phone] Fax: [phone]  [id number] [address], [place] [place]Phone: [phone]: [phone]  [place], [place] [address] Phone: [phone] Fax: [phone]  Pt endorses 100% compliance  Follow Up:  Patient agrees to Care Plan and Follow-up.  Plan: CPP f/u prn  Future Appointments  Date Time Provider [place]  05/26/2022  9:00 AM Marnee Guarneri T, NP [facility]  07/24/2022  9:15 AM Stoioff, Ronda Fairly, MD [facility] None   [place], Pharm.D. - [phone]

## 2022-01-19 DIAGNOSIS — E1159 Type 2 diabetes mellitus with other circulatory complications: Secondary | ICD-10-CM

## 2022-01-19 DIAGNOSIS — Z794 Long term (current) use of insulin: Secondary | ICD-10-CM

## 2022-01-19 DIAGNOSIS — E785 Hyperlipidemia, unspecified: Secondary | ICD-10-CM

## 2022-01-19 DIAGNOSIS — I10 Essential (primary) hypertension: Secondary | ICD-10-CM

## 2022-02-07 DIAGNOSIS — E11649 Type 2 diabetes mellitus with hypoglycemia without coma: Secondary | ICD-10-CM | POA: Diagnosis not present

## 2022-02-10 DIAGNOSIS — E119 Type 2 diabetes mellitus without complications: Secondary | ICD-10-CM | POA: Diagnosis not present

## 2022-02-14 ENCOUNTER — Ambulatory Visit (INDEPENDENT_AMBULATORY_CARE_PROVIDER_SITE_OTHER): Payer: Medicare HMO | Admitting: *Deleted

## 2022-02-14 DIAGNOSIS — Z0000 Encounter for general adult medical examination without abnormal findings: Secondary | ICD-10-CM | POA: Diagnosis not present

## 2022-02-14 NOTE — Progress Notes (Signed)
Subjective:   Aaron [name] is a 71 y.o. male who presents for Medicare Annual/Subsequent preventive examination.  I connected with  Aaron [name] on 02/14/22 by a telephone enabled telemedicine application and verified that I am speaking with the correct person using two identifiers.   I discussed the limitations of evaluation and management by telemedicine. The patient expressed understanding and agreed to proceed.  Patient location: home  Provider location:  Tele-health-home    Review of Systems     Cardiac Risk Factors include: advanced age (>70mn, >>32women);obesity (BMI >30kg/m2);sedentary lifestyle;diabetes mellitus;male gender;hypertension     Objective:    Today's Vitals   02/14/22 0906  PainSc: 5    There is no height or weight on file to calculate BMI.     06/22/2021    8:04 AM 02/13/2021   10:48 AM 11/09/2020    8:25 AM 10/23/2018    9:50 AM 11/27/2017    1:30 PM 11/27/2017    8:01 AM 11/14/2017    9:41 AM  Advanced Directives  Does Patient Have a Medical Advance Directive? No No No No No No No  Would patient like information on creating a medical advance directive?  No - Patient declined No - Patient declined  No - Patient declined No - Patient declined No - Patient declined    Current Medications (verified) Outpatient Encounter Medications as of 02/14/2022  Medication Sig   ACCU-CHEK GUIDE test strip    allopurinol (ZYLOPRIM) 300 MG tablet Take 1 tablet (300 mg total) by mouth daily.   amLODipine (NORVASC) 10 MG tablet Take 1 tablet (10 mg total) by mouth daily.   ASPIRIN 81 PO every evening.    atorvastatin (LIPITOR) 20 MG tablet Take 1 tablet (20 mg total) by mouth daily.   carvedilol (COREG) 25 MG tablet TAKE 1 TABLET TWICE DAILY WITH A MEAL   gabapentin (NEURONTIN) 600 MG tablet Take 1-1.5 tablets (600-900 mg total) by mouth at bedtime.   hydrochlorothiazide (HYDRODIURIL) 25 MG tablet Take 1 tablet (25 mg total) by mouth daily.   insulin aspart  (NOVOLOG) 100 UNIT/ML injection Inject into the skin. Using via insulin pump  Medtronic MiniMed 630G insulin pump   Insulin Human (INSULIN PUMP) SOLN Inject into the skin.   insulin lispro (HUMALOG KWIKPEN) 200 UNIT/ML KwikPen Inject into the skin.   losartan (COZAAR) 100 MG tablet Take 1 tablet (100 mg total) by mouth daily.   pantoprazole (PROTONIX) 20 MG tablet TAKE 1 TABLET(20 MG) BY MOUTH EVERY DAY   tamsulosin (FLOMAX) 0.4 MG CAPS capsule Take 1 capsule (0.4 mg total) by mouth daily.   No facility-administered encounter medications on file as of 02/14/2022.    Allergies (verified) Glucophage [metformin]   History: Past Medical History:  Diagnosis Date   Cancer ([place] 2013   prostate (radiation and surgery)   Diabetes mellitus without complication ([place]    Gout    Hyperlipidemia    Hypertension    Sleep apnea    Spinal stenosis in cervical region 06/03/2020   Past Surgical History:  Procedure Laterality Date   CIRCUMCISION     COLONOSCOPY N/A 06/22/2021   Procedure: COLONOSCOPY;  Surgeon: Toledo, TBenay Pike MD;  Location: [facility] ENDOSCOPY;  Service: Gastroenterology;  Laterality: N/A;  IDDM   CYST EXCISION Left 2015   arm   deviated septrum  2003   JOINT REPLACEMENT Left 2016   Hip   PROSTATE SURGERY     prostatectomy due to cancer   PROSTATECTOMY  2013   TONSILLECTOMY AND ADENOIDECTOMY  2004   TOTAL HIP ARTHROPLASTY Left 2016   TOTAL HIP ARTHROPLASTY Right 11/27/2017   Procedure: TOTAL HIP ARTHROPLASTY ANTERIOR APPROACH;  Surgeon: Hessie Knows, MD;  Location: [facility] ORS;  Service: Orthopedics;  Laterality: Right;   Family History  Problem Relation Age of Onset   Cancer Mother    Breast cancer Mother    Stroke Father    Cancer Sister    Cancer Maternal Uncle    Stroke Maternal Uncle    Social History   Socioeconomic History   Marital status: Married    Spouse name: Not on file   Number of children: Not on file   Years of education: Not on file   Highest education  level: Some college, no degree  Occupational History   Occupation: retired  Tobacco Use   Smoking status: Never   Smokeless tobacco: Never  Vaping Use   Vaping Use: Never used  Substance and Sexual Activity   Alcohol use: No   Drug use: No   Sexual activity: Yes  Other Topics Concern   Not on file  Social History Narrative   Not on file   Social Determinants of Health   Financial Resource Strain: Low Risk  (02/14/2022)   Overall Financial Resource Strain (CARDIA)    Difficulty of Paying Living Expenses: Not hard at all  Food Insecurity: No Food Insecurity (02/14/2022)   Hunger Vital Sign    Worried About Running Out of Food in the Last Year: Never true    [place] in the Last Year: Never true  Transportation Needs: No Transportation Needs (01/02/2022)   PRAPARE - Hydrologist (Medical): No    Lack of Transportation (Non-Medical): No  Physical Activity: Inactive (02/14/2022)   Exercise Vital Sign    Days of Exercise per Week: 0 days    Minutes of Exercise per Session: 0 min  Stress: No Stress Concern Present (02/14/2022)   [place]    Feeling of Stress : Not at all  Social Connections: Moderately Integrated (02/14/2022)   Social Connection and Isolation Panel [NHANES]    Frequency of Communication with Friends and Family: More than three times a week    Frequency of Social Gatherings with Friends and Family: Never    Attends Religious Services: More than 4 times per year    Active Member of [employer] or Organizations: No    Attends Music therapist: Never    Marital Status: Married    Tobacco Counseling Counseling given: Not Answered   Clinical Intake:  Pre-visit preparation completed: Yes  Pain : 0-10 Pain Score: 5  Pain Location: Neck Pain Descriptors / Indicators: Constant, Burning, Aching Pain Onset: More than a month ago Pain Frequency:  Intermittent Pain Relieving Factors: gabapentin  Pain Relieving Factors: gabapentin  Diabetes: Yes CBG done?: No Did pt. bring in CBG monitor from home?: No  How often do you need to have someone help you when you read instructions, pamphlets, or other written materials from your doctor or pharmacy?: 1 - Never  Diabetic?   Yes  Nutrition Risk Assessment:  Has the patient had any N/V/D within the last 2 months?  No  Does the patient have any non-healing wounds?  No  Has the patient had any unintentional weight loss or weight gain?  No    Diabetes:  Is the patient diabetic?  Yes  If  diabetic, was a CBG obtained today?  No  Did the patient bring in their glucometer from home?  No  How often do you monitor your CBG's? 3 x day.   Financial Strains and Diabetes Management:  Are you having any financial strains with the device, your supplies or your medication? No .  Does the patient want to be seen by Chronic Care Management for management of their diabetes?  No  Would the patient like to be referred to a Nutritionist or for Diabetic Management?  No   Diabetic Exams:  Diabetic Eye Exam:  Pt has been advised about the importance in completing this exam.  Diabetic Foot Exam: Pt has been advised about the importance in completing this exam..    Interpreter Needed?: No  Information entered by :: Leroy Kennedy LPN   Activities of Daily Living    02/14/2022    9:16 AM 11/23/2021    9:13 AM  In your present state of health, do you have any difficulty performing the following activities:  Hearing? 0 0  Vision? 0 0  Difficulty concentrating or making decisions? 0 0  Walking or climbing stairs? 1 0  Dressing or bathing? 0 0  Doing errands, shopping? 0 0  Preparing Food and eating ? N   Using the Toilet? N   In the past six months, have you accidently leaked urine? N   Do you have problems with loss of bowel control? N   Managing your Medications? N   Managing your Finances? N    Housekeeping or managing your Housekeeping? N     Patient Care Team: Venita Lick, NP as PCP - General (Nurse Practitioner) Judi Cong, MD as Consulting Physician (Endocrinology) Lane Hacker, [facility] (Pharmacist)  Indicate any recent Medical Services you may have received from other than [facility] providers in the past year (date may be approximate).     Assessment:   This is a routine wellness examination for [place].  Hearing/Vision screen Hearing Screening - Comments:: No trouble hearing Vision Screening - Comments:: Up to date [facility]  Dietary issues and exercise activities discussed: Current Exercise Habits: The patient does not participate in regular exercise at present   Goals Addressed             This Visit's Progress    Weight (lb) < 200 lb (90.7 kg)         Depression Screen    02/14/2022    9:12 AM 11/23/2021    9:44 AM 08/04/2021    8:34 AM 05/06/2021   10:26 AM 02/13/2021   10:42 AM 02/13/2021   10:41 AM 11/04/2020    9:02 AM  PHQ 2/9 Scores  PHQ - 2 Score 0 0 0 0 0 0 0  PHQ- 9 Score 0 0 0 0 0      Fall Risk    02/14/2022    9:08 AM 02/13/2021   10:41 AM 11/09/2020    8:24 AM 07/15/2020    8:29 AM 06/03/2020    8:39 AM  Fall Risk   Falls in the past year? 0 0 0 0 0  Number falls in past yr: 0 0     Injury with Fall? 0 0     Risk for fall due to :  No Fall Risks     Follow up Falls evaluation completed;Education provided;Falls prevention discussed Falls evaluation completed       FALL RISK PREVENTION PERTAINING TO THE HOME:  Any stairs in  or around the home? Yes  If so, are there any without handrails? No  Home free of loose throw rugs in walkways, pet beds, electrical cords, etc? Yes  Adequate lighting in your home to reduce risk of falls? Yes   ASSISTIVE DEVICES UTILIZED TO PREVENT FALLS:  Life alert? No  Use of a cane, walker or w/c? Yes  Grab bars in the bathroom? No  Shower chair or bench in shower? Yes  Elevated toilet seat or a  handicapped toilet? Yes   TIMED UP AND GO:  Was the test performed? No .    Cognitive Function:        02/14/2022    9:10 AM 02/13/2021   10:39 AM 10/23/2018    9:51 AM  6CIT Screen  What Year? 0 points 0 points 0 points  What month? 0 points 0 points 0 points  What time? 0 points 0 points 0 points  Count back from 20  0 points 0 points  Months in reverse 0 points 0 points 0 points  Repeat phrase 0 points 0 points 0 points  Total Score  0 points 0 points    Immunizations Immunization History  Administered Date(s) Administered   Fluad Quad(high Dose 65+) 03/28/2019, 02/03/2020, 02/04/2021   Influenza, High Dose Seasonal PF 03/29/2018   Influenza,inj,Quad PF,6+ Mos 07/11/2016, 02/15/2017   Influenza-Unspecified 03/26/2015   PFIZER(Purple Top)SARS-COV-2 Vaccination 08/05/2019, 08/26/2019, 02/25/2020   Pneumococcal Conjugate-13 03/21/2018   Pneumococcal Polysaccharide-23 07/02/2019   Pneumococcal-Unspecified 10/20/1992, 02/20/1999   Td 11/23/2021   Tdap 05/31/2011   Zoster, Live 07/11/2016    TDAP status: Up to date  Flu Vaccine status: Up to date  Pneumococcal vaccine status: Up to date  Covid-19 vaccine status: Information provided on how to obtain vaccines.   Qualifies for Shingles Vaccine? Yes   Zostavax completed Yes   Shingrix Completed?: No.    Education has been provided regarding the importance of this vaccine. Patient has been advised to call insurance company to determine out of pocket expense if they have not yet received this vaccine. Advised may also receive vaccine at local pharmacy or Health Dept. Verbalized acceptance and understanding.  Screening Tests Health Maintenance  Topic Date Due   Zoster Vaccines- Shingrix (1 of 2) Never done   COVID-19 Vaccine (4 - Pfizer series) 04/21/2020   INFLUENZA VACCINE  12/20/2021   HEMOGLOBIN A1C  02/04/2022   OPHTHALMOLOGY EXAM  02/28/2022   Diabetic kidney evaluation - Urine ACR  08/05/2022   Diabetic kidney  evaluation - GFR measurement  11/24/2022   FOOT EXAM  11/24/2022   COLONOSCOPY (Pts 45-38yr Insurance coverage will need to be confirmed)  06/23/2031   TETANUS/TDAP  11/24/2031   Pneumonia Vaccine 71 Years old  Completed   Hepatitis C Screening  Completed   HPV VACCINES  Aged Out    Health Maintenance  Health Maintenance Due  Topic Date Due   Zoster Vaccines- Shingrix (1 of 2) Never done   COVID-19 Vaccine (4 - Pfizer series) 04/21/2020   INFLUENZA VACCINE  12/20/2021   HEMOGLOBIN A1C  02/04/2022    Colorectal cancer screening: Type of screening: Colonoscopy. Completed 2023. Repeat every 10 years  Lung Cancer Screening: (Low Dose CT Chest recommended if Age 87110-80years, 30 pack-year currently smoking OR have quit w/in 15years.) does not qualify.   Lung Cancer Screening Referral:   Additional Screening:  Hepatitis C Screening: does not qualify; Completed 2017  Vision Screening: Recommended annual ophthalmology exams for early detection of  glaucoma and other disorders of the eye. Is the patient up to date with their annual eye exam?  No  Who is the provider or what is the name of the office in which the patient attends annual eye exams? [facility] If pt is not established with a provider, would they like to be referred to a provider to establish care? No .   Dental Screening: Recommended annual dental exams for proper oral hygiene  Community Resource Referral / Chronic Care Management: CRR required this visit?  No   CCM required this visit?  No      Plan:     I have personally reviewed and noted the following in the patient's chart:   Medical and social history Use of alcohol, tobacco or illicit drugs  Current medications and supplements including opioid prescriptions. Patient is not currently taking opioid prescriptions. Functional ability and status Nutritional status Physical activity Advanced directives List of other physicians Hospitalizations, surgeries, and ER  visits in previous 12 months Vitals Screenings to include cognitive, depression, and falls Referrals and appointments  In addition, I have reviewed and discussed with patient certain preventive protocols, quality metrics, and best practice recommendations. A written personalized care plan for preventive services as well as general preventive health recommendations were provided to patient.     Leroy Kennedy, LPN   2/70/3500   Nurse Notes:

## 2022-02-14 NOTE — Patient Instructions (Signed)
Aaron [name] , Thank you for taking time to come for your Medicare Wellness Visit. I appreciate your ongoing commitment to your health goals. Please review the following plan we discussed and let me know if I can assist you in the future.   Screening recommendations/referrals: Colonoscopy: up to date Recommended yearly ophthalmology/optometry visit for glaucoma screening and checkup Recommended yearly dental visit for hygiene and checkup  Vaccinations: Influenza vaccine: up to date Pneumococcal vaccine: up to date Tdap vaccine: up to date Shingles vaccine: Education provided    Advanced directives: Education provided  Conditions/risks identified:   Next appointment: 05-26-2021 @ 9:00 [place]  Preventive Care 69 Years and Older, Male Preventive care refers to lifestyle choices and visits with your health care provider that can promote health and wellness. What does preventive care include? A yearly physical exam. This is also called an annual well check. Dental exams once or twice a year. Routine eye exams. Ask your health care provider how often you should have your eyes checked. Personal lifestyle choices, including: Daily care of your teeth and gums. Regular physical activity. Eating a healthy diet. Avoiding tobacco and drug use. Limiting alcohol use. Practicing safe sex. Taking low doses of aspirin every day. Taking vitamin and mineral supplements as recommended by your health care provider. What happens during an annual well check? The services and screenings done by your health care provider during your annual well check will depend on your age, overall health, lifestyle risk factors, and family history of disease. Counseling  Your health care provider may ask you questions about your: Alcohol use. Tobacco use. Drug use. Emotional well-being. Home and relationship well-being. Sexual activity. Eating habits. History of falls. Memory and ability to understand  (cognition). Work and work Statistician. Screening  You may have the following tests or measurements: Height, weight, and BMI. Blood pressure. Lipid and cholesterol levels. These may be checked every 5 years, or more frequently if you are over 37 years old. Skin check. Lung cancer screening. You may have this screening every year starting at age 60 if you have a 30-pack-year history of smoking and currently smoke or have quit within the past 15 years. Fecal occult blood test (FOBT) of the stool. You may have this test every year starting at age 91. Flexible sigmoidoscopy or colonoscopy. You may have a sigmoidoscopy every 5 years or a colonoscopy every 10 years starting at age 25. Prostate cancer screening. Recommendations will vary depending on your family history and other risks. Hepatitis C blood test. Hepatitis B blood test. Sexually transmitted disease (STD) testing. Diabetes screening. This is done by checking your blood sugar (glucose) after you have not eaten for a while (fasting). You may have this done every 1-3 years. Abdominal aortic aneurysm (AAA) screening. You may need this if you are a current or former smoker. Osteoporosis. You may be screened starting at age 15 if you are at high risk. Talk with your health care provider about your test results, treatment options, and if necessary, the need for more tests. Vaccines  Your health care provider may recommend certain vaccines, such as: Influenza vaccine. This is recommended every year. Tetanus, diphtheria, and acellular pertussis (Tdap, Td) vaccine. You may need a Td booster every 10 years. Zoster vaccine. You may need this after age 28. Pneumococcal 13-valent conjugate (PCV13) vaccine. One dose is recommended after age 65. Pneumococcal polysaccharide (PPSV23) vaccine. One dose is recommended after age 76. Talk to your health care provider about which screenings and vaccines  you need and how often you need them. This  information is not intended to replace advice given to you by your health care provider. Make sure you discuss any questions you have with your health care provider. Document Released: 06/04/2015 Document Revised: 01/26/2016 Document Reviewed: 03/09/2015 Elsevier Interactive Patient Education  2017 [place] Prevention in the Home Falls can cause injuries. They can happen to people of all ages. There are many things you can do to make your home safe and to help prevent falls. What can I do on the outside of my home? Regularly fix the edges of walkways and driveways and fix any cracks. Remove anything that might make you trip as you walk through a door, such as a raised step or threshold. Trim any bushes or trees on the path to your home. Use bright outdoor lighting. Clear any walking paths of anything that might make someone trip, such as rocks or tools. Regularly check to see if handrails are loose or broken. Make sure that both sides of any steps have handrails. Any raised decks and porches should have guardrails on the edges. Have any leaves, snow, or ice cleared regularly. Use sand or salt on walking paths during winter. Clean up any spills in your garage right away. This includes oil or grease spills. What can I do in the bathroom? Use night lights. Install grab bars by the toilet and in the tub and shower. Do not use towel bars as grab bars. Use non-skid mats or decals in the tub or shower. If you need to sit down in the shower, use a plastic, non-slip stool. Keep the floor dry. Clean up any water that spills on the floor as soon as it happens. Remove soap buildup in the tub or shower regularly. Attach bath mats securely with double-sided non-slip rug tape. Do not have throw rugs and other things on the floor that can make you trip. What can I do in the bedroom? Use night lights. Make sure that you have a light by your bed that is easy to reach. Do not use any sheets or  blankets that are too big for your bed. They should not hang down onto the floor. Have a firm chair that has side arms. You can use this for support while you get dressed. Do not have throw rugs and other things on the floor that can make you trip. What can I do in the kitchen? Clean up any spills right away. Avoid walking on wet floors. Keep items that you use a lot in easy-to-reach places. If you need to reach something above you, use a strong step stool that has a grab bar. Keep electrical cords out of the way. Do not use floor polish or wax that makes floors slippery. If you must use wax, use non-skid floor wax. Do not have throw rugs and other things on the floor that can make you trip. What can I do with my stairs? Do not leave any items on the stairs. Make sure that there are handrails on both sides of the stairs and use them. Fix handrails that are broken or loose. Make sure that handrails are as long as the stairways. Check any carpeting to make sure that it is firmly attached to the stairs. Fix any carpet that is loose or worn. Avoid having throw rugs at the top or bottom of the stairs. If you do have throw rugs, attach them to the floor with carpet tape. Make sure  that you have a light switch at the top of the stairs and the bottom of the stairs. If you do not have them, ask someone to add them for you. What else can I do to help prevent falls? Wear shoes that: Do not have high heels. Have rubber bottoms. Are comfortable and fit you well. Are closed at the toe. Do not wear sandals. If you use a stepladder: Make sure that it is fully opened. Do not climb a closed stepladder. Make sure that both sides of the stepladder are locked into place. Ask someone to hold it for you, if possible. Clearly mark and make sure that you can see: Any grab bars or handrails. First and last steps. Where the edge of each step is. Use tools that help you move around (mobility aids) if they are  needed. These include: Canes. Walkers. Scooters. Crutches. Turn on the lights when you go into a dark area. Replace any light bulbs as soon as they burn out. Set up your furniture so you have a clear path. Avoid moving your furniture around. If any of your floors are uneven, fix them. If there are any pets around you, be aware of where they are. Review your medicines with your doctor. Some medicines can make you feel dizzy. This can increase your chance of falling. Ask your doctor what other things that you can do to help prevent falls. This information is not intended to replace advice given to you by your health care provider. Make sure you discuss any questions you have with your health care provider. Document Released: 03/04/2009 Document Revised: 10/14/2015 Document Reviewed: 06/12/2014 Elsevier Interactive Patient Education  2017 [employer].

## 2022-02-17 ENCOUNTER — Encounter: Payer: Self-pay | Admitting: Urology

## 2022-02-23 DIAGNOSIS — E669 Obesity, unspecified: Secondary | ICD-10-CM | POA: Diagnosis not present

## 2022-02-23 DIAGNOSIS — E1169 Type 2 diabetes mellitus with other specified complication: Secondary | ICD-10-CM | POA: Diagnosis not present

## 2022-02-27 DIAGNOSIS — H524 Presbyopia: Secondary | ICD-10-CM | POA: Diagnosis not present

## 2022-02-27 DIAGNOSIS — H5203 Hypermetropia, bilateral: Secondary | ICD-10-CM | POA: Diagnosis not present

## 2022-02-27 DIAGNOSIS — H52223 Regular astigmatism, bilateral: Secondary | ICD-10-CM | POA: Diagnosis not present

## 2022-02-27 DIAGNOSIS — H40013 Open angle with borderline findings, low risk, bilateral: Secondary | ICD-10-CM | POA: Diagnosis not present

## 2022-02-27 DIAGNOSIS — H2513 Age-related nuclear cataract, bilateral: Secondary | ICD-10-CM | POA: Diagnosis not present

## 2022-02-27 DIAGNOSIS — E119 Type 2 diabetes mellitus without complications: Secondary | ICD-10-CM | POA: Diagnosis not present

## 2022-02-27 DIAGNOSIS — Z7984 Long term (current) use of oral hypoglycemic drugs: Secondary | ICD-10-CM | POA: Diagnosis not present

## 2022-02-27 DIAGNOSIS — H04213 Epiphora due to excess lacrimation, bilateral lacrimal glands: Secondary | ICD-10-CM | POA: Diagnosis not present

## 2022-02-28 DIAGNOSIS — E1169 Type 2 diabetes mellitus with other specified complication: Secondary | ICD-10-CM | POA: Diagnosis not present

## 2022-02-28 DIAGNOSIS — Z9641 Presence of insulin pump (external) (internal): Secondary | ICD-10-CM | POA: Diagnosis not present

## 2022-02-28 DIAGNOSIS — Z23 Encounter for immunization: Secondary | ICD-10-CM | POA: Diagnosis not present

## 2022-02-28 DIAGNOSIS — E669 Obesity, unspecified: Secondary | ICD-10-CM | POA: Diagnosis not present

## 2022-05-01 DIAGNOSIS — E11649 Type 2 diabetes mellitus with hypoglycemia without coma: Secondary | ICD-10-CM | POA: Diagnosis not present

## 2022-05-01 DIAGNOSIS — E1169 Type 2 diabetes mellitus with other specified complication: Secondary | ICD-10-CM | POA: Diagnosis not present

## 2022-05-01 DIAGNOSIS — Z9641 Presence of insulin pump (external) (internal): Secondary | ICD-10-CM | POA: Diagnosis not present

## 2022-05-01 DIAGNOSIS — E669 Obesity, unspecified: Secondary | ICD-10-CM | POA: Diagnosis not present

## 2022-05-01 DIAGNOSIS — E119 Type 2 diabetes mellitus without complications: Secondary | ICD-10-CM | POA: Diagnosis not present

## 2022-05-03 ENCOUNTER — Encounter: Payer: Self-pay | Admitting: Nurse Practitioner

## 2022-05-22 NOTE — Patient Instructions (Addendum)
[facility] [phone]  Diabetes Mellitus Basics  Diabetes mellitus, or diabetes, is a long-term (chronic) disease. It occurs when the body does not properly use sugar (glucose) that is released from food after you eat. Diabetes mellitus may be caused by one or both of these problems: Your pancreas does not make enough of a hormone called insulin. Your body does not react in a normal way to the insulin that it makes. Insulin lets glucose enter cells in your body. This gives you energy. If you have diabetes, glucose cannot get into cells. This causes high blood glucose (hyperglycemia). How to treat and manage diabetes You may need to take insulin or other diabetes medicines daily to keep your glucose in balance. If you are prescribed insulin, you will learn how to give yourself insulin by injection. You may need to adjust the amount of insulin you take based on the foods that you eat. You will need to check your blood glucose levels using a glucose monitor as told by your health care provider. The readings can help determine if you have low or high blood glucose. Generally, you should have these blood glucose levels: Before meals (preprandial): 80-130 mg/dL (4.4-7.2 mmol/L). After meals (postprandial): below 180 mg/dL (10 mmol/L). Hemoglobin A1c (HbA1c) level: less than 7%. Your health care provider will set treatment goals for you. Keep all follow-up visits. This is important. Follow these instructions at home: Diabetes medicines Take your diabetes medicines every day as told by your health care provider. List your diabetes medicines here: Name of medicine: ______________________________ Amount (dose): _______________ Time (a.m./p.m.): _______________ Notes: ___________________________________ Name of medicine: ______________________________ Amount (dose): _______________ Time (a.m./p.m.): _______________ Notes: ___________________________________ Name of medicine:  ______________________________ Amount (dose): _______________ Time (a.m./p.m.): _______________ Notes: ___________________________________ Insulin If you use insulin, list the types of insulin you use here: Insulin type: ______________________________ Amount (dose): _______________ Time (a.m./p.m.): _______________Notes: ___________________________________ Insulin type: ______________________________ Amount (dose): _______________ Time (a.m./p.m.): _______________ Notes: ___________________________________ Insulin type: ______________________________ Amount (dose): _______________ Time (a.m./p.m.): _______________ Notes: ___________________________________ Insulin type: ______________________________ Amount (dose): _______________ Time (a.m./p.m.): _______________ Notes: ___________________________________ Insulin type: ______________________________ Amount (dose): _______________ Time (a.m./p.m.): _______________ Notes: ___________________________________ Managing blood glucose  Check your blood glucose levels using a glucose monitor as told by your health care provider. Write down the times that you check your glucose levels here: Time: _______________ Notes: ___________________________________ Time: _______________ Notes: ___________________________________ Time: _______________ Notes: ___________________________________ Time: _______________ Notes: ___________________________________ Time: _______________ Notes: ___________________________________ Time: _______________ Notes: ___________________________________  Low blood glucose Low blood glucose (hypoglycemia) is when glucose is at or below 70 mg/dL (3.9 mmol/L). Symptoms may include: Feeling: Hungry. Sweaty and clammy. Irritable or easily upset. Dizzy. Sleepy. Having: A fast heartbeat. A headache. A change in your vision. Numbness around the mouth, lips, or tongue. Having trouble with: Moving  (coordination). Sleeping. Treating low blood glucose To treat low blood glucose, eat or drink something containing sugar right away. If you can think clearly and swallow safely, follow the 15:15 rule: Take 15 grams of a fast-acting carb (carbohydrate), as told by your health care provider. Some fast-acting carbs are: Glucose tablets: take 3-4 tablets. Hard candy: eat 3-5 pieces. Fruit juice: drink 4 oz (120 mL). Regular (not diet) soda: drink 4-6 oz (120-180 mL). Honey or sugar: eat 1 Tbsp (15 mL). Check your blood glucose levels 15 minutes after you take the carb. If your glucose is still at or below 70 mg/dL (3.9 mmol/L), take 15 grams of a carb again. If your glucose does not go above  70 mg/dL (3.9 mmol/L) after 3 tries, get help right away. After your glucose goes back to normal, eat a meal or a snack within 1 hour. Treating very low blood glucose If your glucose is at or below 54 mg/dL (3 mmol/L), you have very low blood glucose (severe hypoglycemia). This is an emergency. Do not wait to see if the symptoms will go away. Get medical help right away. Call your local emergency services (911 in the [place] Do not drive yourself to the hospital. Questions to ask your health care provider Should I talk with a diabetes educator? What equipment will I need to care for myself at home? What diabetes medicines do I need? When should I take them? How often do I need to check my blood glucose levels? What number can I call if I have questions? When is my follow-up visit? Where can I find a support group for people with diabetes? Where to find more information American Diabetes Association: www.diabetes.org Association of Diabetes Care and Education Specialists: www.diabeteseducator.org Contact a health care provider if: Your blood glucose is at or above 240 mg/dL (13.3 mmol/L) for 2 days in a row. You have been sick or have had a fever for 2 days or more, and you are not getting better. You  have any of these problems for more than 6 hours: You cannot eat or drink. You feel nauseous. You vomit. You have diarrhea. Get help right away if: Your blood glucose is lower than 54 mg/dL (3 mmol/L). You get confused. You have trouble thinking clearly. You have trouble breathing. These symptoms may represent a serious problem that is an emergency. Do not wait to see if the symptoms will go away. Get medical help right away. Call your local emergency services (911 in the [place] Do not drive yourself to the hospital. Summary Diabetes mellitus is a chronic disease that occurs when the body does not properly use sugar (glucose) that is released from food after you eat. Take insulin and diabetes medicines as told. Check your blood glucose every day, as often as told. Keep all follow-up visits. This is important. This information is not intended to replace advice given to you by your health care provider. Make sure you discuss any questions you have with your health care provider. Document Revised: 09/09/2019 Document Reviewed: 09/09/2019 Elsevier Patient Education  [place].

## 2022-05-26 ENCOUNTER — Encounter: Payer: Self-pay | Admitting: Nurse Practitioner

## 2022-05-26 ENCOUNTER — Ambulatory Visit (INDEPENDENT_AMBULATORY_CARE_PROVIDER_SITE_OTHER): Payer: Medicare HMO | Admitting: Nurse Practitioner

## 2022-05-26 VITALS — BP 116/67 | HR 52 | Temp 97.8°F | Ht 73.5 in | Wt 334.8 lb

## 2022-05-26 DIAGNOSIS — G894 Chronic pain syndrome: Secondary | ICD-10-CM

## 2022-05-26 DIAGNOSIS — M4802 Spinal stenosis, cervical region: Secondary | ICD-10-CM

## 2022-05-26 DIAGNOSIS — E785 Hyperlipidemia, unspecified: Secondary | ICD-10-CM

## 2022-05-26 DIAGNOSIS — E1159 Type 2 diabetes mellitus with other circulatory complications: Secondary | ICD-10-CM | POA: Diagnosis not present

## 2022-05-26 DIAGNOSIS — E1129 Type 2 diabetes mellitus with other diabetic kidney complication: Secondary | ICD-10-CM

## 2022-05-26 DIAGNOSIS — I152 Hypertension secondary to endocrine disorders: Secondary | ICD-10-CM

## 2022-05-26 DIAGNOSIS — E113293 Type 2 diabetes mellitus with mild nonproliferative diabetic retinopathy without macular edema, bilateral: Secondary | ICD-10-CM

## 2022-05-26 DIAGNOSIS — Z6841 Body Mass Index (BMI) 40.0 and over, adult: Secondary | ICD-10-CM | POA: Diagnosis not present

## 2022-05-26 DIAGNOSIS — N183 Chronic kidney disease, stage 3 unspecified: Secondary | ICD-10-CM

## 2022-05-26 DIAGNOSIS — K219 Gastro-esophageal reflux disease without esophagitis: Secondary | ICD-10-CM

## 2022-05-26 DIAGNOSIS — I700 Atherosclerosis of aorta: Secondary | ICD-10-CM | POA: Diagnosis not present

## 2022-05-26 DIAGNOSIS — G4733 Obstructive sleep apnea (adult) (pediatric): Secondary | ICD-10-CM

## 2022-05-26 DIAGNOSIS — E1122 Type 2 diabetes mellitus with diabetic chronic kidney disease: Secondary | ICD-10-CM | POA: Diagnosis not present

## 2022-05-26 DIAGNOSIS — E1169 Type 2 diabetes mellitus with other specified complication: Secondary | ICD-10-CM | POA: Diagnosis not present

## 2022-05-26 DIAGNOSIS — R809 Proteinuria, unspecified: Secondary | ICD-10-CM

## 2022-05-26 DIAGNOSIS — Z794 Long term (current) use of insulin: Secondary | ICD-10-CM

## 2022-05-26 DIAGNOSIS — Z9641 Presence of insulin pump (external) (internal): Secondary | ICD-10-CM

## 2022-05-26 MED ORDER — AMLODIPINE BESYLATE 10 MG PO TABS: 10.0000 mg | ORAL_TABLET | Freq: Every day | ORAL | 4 refills | Status: DC

## 2022-05-26 MED ORDER — CARVEDILOL 25 MG PO TABS: ORAL_TABLET | ORAL | 4 refills | Status: DC

## 2022-05-26 MED ORDER — TAMSULOSIN HCL 0.4 MG PO CAPS: 0.4000 mg | ORAL_CAPSULE | Freq: Every day | ORAL | 4 refills | Status: DC

## 2022-05-26 MED ORDER — ALLOPURINOL 300 MG PO TABS: 300.0000 mg | ORAL_TABLET | Freq: Every day | ORAL | 4 refills | Status: DC

## 2022-05-26 MED ORDER — GABAPENTIN 600 MG PO TABS: 600.0000 mg | ORAL_TABLET | Freq: Every evening | ORAL | 4 refills | Status: DC

## 2022-05-26 MED ORDER — ATORVASTATIN CALCIUM 20 MG PO TABS: 20.0000 mg | ORAL_TABLET | Freq: Every day | ORAL | 4 refills | Status: DC

## 2022-05-26 MED ORDER — LOSARTAN POTASSIUM 100 MG PO TABS: 100.0000 mg | ORAL_TABLET | Freq: Every day | ORAL | 4 refills | Status: DC

## 2022-05-26 MED ORDER — HYDROCHLOROTHIAZIDE 25 MG PO TABS: 25.0000 mg | ORAL_TABLET | Freq: Every day | ORAL | 4 refills | Status: DC

## 2022-05-26 NOTE — Progress Notes (Signed)
BP 116/67   Pulse (!) 52   Temp 97.8 F (36.6 C) (Oral)   Ht 6' 1.5" (1.867 m)   Wt (!) 334 lb 12.8 oz (151.9 kg)   SpO2 99%   BMI 43.57 kg/m    Subjective:    Patient ID: Aaron [name], male    DOB: 10/22/1950, 72 y.o.   MRN: [id number]  HPI: Aaron [name] is a 72 y.o. male  Chief Complaint  Patient presents with   Hypertension   Diabetes   Hyperlipidemia   Gastroesophageal Reflux   Chronic Kidney Disease   Pain   DIABETES Followed by Dr. Gabriel Carina with last visit 05/01/22 with 6.7% A1c last check.   Uses Tandem T:slim insulin pump (04/2020 placed): "Basal rates Correction factor Carb ratio Target BG 12 am 2.45 units/hr 20 7.6 110 4 am 1.85 units/hr 20 7.6 110 8:30 am 1.1 units/hr 20 7.6 110 11 am 1.5 units.hr 20 5.6 110 8 pm 2.2 units/hr 20 5.6 110 24-hr basal = 43.175 "units" Humalog U200 insulin (equals 86.35 units of U100 insulin)"  Hypoglycemic episodes: occasional Polydipsia/polyuria: no Visual disturbance: no Chest pain: no Paresthesias: no Glucose Monitoring: yes             Accucheck frequency: QID -- on average 154             Fasting glucose:              Post prandial:              Evening:              Before meals:  Taking Insulin?: yes             Long acting insulin:             Short acting insulin: Blood Pressure Monitoring: not checking Retinal Examination: Up to Date = Dr. Matilde Sprang for retinopathy Foot Exam: Up to Date Pneumovax: Up to Date Influenza: Up to Date Aspirin: yes    HYPERTENSION / HYPERLIPIDEMIA Taking Losartan 100 MG, HCTZ 25 MG daily, Carvedilol 25 MG BID, Amlodipine 10 MG daily + Lipitor 20 MG.  Continues to use CPAP 100% every night -- needs new supplies sent.  History on 09/16/19 imaging aortic atherosclerosis noted. Satisfied with current treatment? yes Duration of hypertension: chronic BP monitoring frequency: not checking BP range:  BP medication side effects: no Duration of hyperlipidemia:  chronic Cholesterol medication side effects: no Cholesterol supplements: none Medication compliance: good compliance Aspirin: yes Recent stressors: no Recurrent headaches: no Visual changes: no Palpitations: no Dyspnea: no Chest pain: no Lower extremity edema: no Dizzy/lightheaded: no   GERD Continues on Protonix daily.  Feels like food gets caught in throat, notices this every time he eats.  Seeing GI on Tuesday. GERD control status: stable Satisfied with current treatment? yes Heartburn frequency: none Medication side effects: no  Medication compliance: stable Previous GERD medications: Antacid use frequency:  none Dysphagia:  as above Odynophagia:  no Hematemesis: no Blood in stool: no EGD: yes   CHRONIC KIDNEY DISEASE Stable on recent check. CKD status: stable Medications renally dose: yes Previous renal evaluation: no Pneumovax:  Up to Date Influenza Vaccine:  Up to Date   CHRONIC NECK PAIN Last visit with Dr. Holley Raring was 11/09/20 for chronic pain and spinal stenosis in cervical spine.  He is to return as needed.  Is taking Gabapentin 600 MG QHS.  Neurosurgery Dr. Lacinda Axon on 04/19/21 and no changes made, to  return as needed. Treatments attempted: rest, ice, heat, APAP and physical therapy , Gabapentin Compliant with recommended treatment: yes Relief with NSAIDs?:  No NSAIDs Taken Location:midline Duration:chronic Severity: moderate Quality: dull and aching Frequency: constant Radiation: none Aggravating factors: lifting, movement and bending Alleviating factors: rest, ice, heat, APAP and muscle relaxer. Gabapentin Weakness:  no Paresthesias / decreased sensation:  no  Fevers:  no   Relevant past medical, surgical, family and social history reviewed and updated as indicated. Interim medical history since our last visit reviewed. Allergies and medications reviewed and updated.  Review of Systems  Constitutional:  Negative for activity change, diaphoresis,  fatigue and fever.  Respiratory:  Negative for cough, chest tightness, shortness of breath and wheezing.   Cardiovascular:  Negative for chest pain, palpitations and leg swelling.  Gastrointestinal: Negative.   Endocrine: Negative for cold intolerance, heat intolerance, polydipsia, polyphagia and polyuria.  Musculoskeletal:  Positive for neck pain.  Neurological: Negative.   Psychiatric/Behavioral: Negative.     Per HPI unless specifically indicated above     Objective:    BP 116/67   Pulse (!) 52   Temp 97.8 F (36.6 C) (Oral)   Ht 6' 1.5" (1.867 m)   Wt (!) 334 lb 12.8 oz (151.9 kg)   SpO2 99%   BMI 43.57 kg/m   Wt Readings from Last 3 Encounters:  05/26/22 (!) 334 lb 12.8 oz (151.9 kg)  11/23/21 (!) 329 lb 12.8 oz (149.6 kg)  08/04/21 (!) 325 lb 9.6 oz (147.7 kg)    Physical Exam Vitals and nursing note reviewed.  Constitutional:      General: He is awake. He is not in acute distress.    Appearance: Normal appearance. He is well-groomed. He is morbidly obese. He is not ill-appearing or toxic-appearing.  HENT:     Head: Normocephalic and atraumatic.     Right Ear: Hearing and external ear normal. No drainage.     Left Ear: Hearing and external ear normal. No drainage.  Eyes:     General: No scleral icterus.       Right eye: No discharge.        Left eye: No discharge.     Pupils: Pupils are equal, round, and reactive to light.  Cardiovascular:     Rate and Rhythm: Normal rate and regular rhythm.     Pulses: Normal pulses.     Heart sounds: Normal heart sounds. No murmur heard.    No friction rub. No gallop.  Pulmonary:     Effort: Pulmonary effort is normal. No respiratory distress.     Breath sounds: Normal breath sounds. No stridor. No wheezing, rhonchi or rales.  Chest:     Chest wall: No tenderness.  Musculoskeletal:     Cervical back: Normal range of motion and neck supple.  Skin:    General: Skin is warm and dry.     Capillary Refill: Capillary refill  takes less than 2 seconds.     Coloration: Skin is not jaundiced or pale.     Findings: No bruising, erythema, lesion or rash.  Neurological:     General: No focal deficit present.     Mental Status: He is alert and oriented to person, place, and time. Mental status is at baseline.  Psychiatric:        Mood and Affect: Mood normal.        Behavior: Behavior normal. Behavior is cooperative.        Thought Content: Thought content  normal.        Judgment: Judgment normal.    Results for orders placed or performed in visit on 05/03/22  HM DIABETES EYE EXAM  Result Value Ref Range   HM Diabetic Eye Exam No Retinopathy No Retinopathy      Assessment & Plan:   Problem List Items Addressed This Visit       Cardiovascular and Mediastinum   Aortic atherosclerosis ([place])    Chronic.  Noted on 09/16/19 imaging.  Continue daily statin and ASA for prevention + focus on healthy diet and modest weight loss.      Relevant Medications   atorvastatin (LIPITOR) 20 MG tablet   amLODipine (NORVASC) 10 MG tablet   carvedilol (COREG) 25 MG tablet   hydrochlorothiazide (HYDRODIURIL) 25 MG tablet   losartan (COZAAR) 100 MG tablet   Hypertension associated with diabetes ([place])    Chronic, stable with BP at goal in office..  Recommend he continue to check BP at home twice daily and document for provider visits. Continue current medication regimen at this time, is on max dose multiple medications.  May need to adjust in future and change HCTZ if return of gout flares or decline in kidney function.  Labs: up to date with endo.  Return in 6 months for physical.      Relevant Medications   atorvastatin (LIPITOR) 20 MG tablet   amLODipine (NORVASC) 10 MG tablet   carvedilol (COREG) 25 MG tablet   hydrochlorothiazide (HYDRODIURIL) 25 MG tablet   losartan (COZAAR) 100 MG tablet     Respiratory   Sleep apnea    Chronic, ongoing.  Continues to use CPAP faithfully every night and denies any issues with this.   New supplies ordered.      Relevant Orders   For home use only DME continuous positive airway pressure (CPAP)     Digestive   Gastroesophageal reflux disease without esophagitis    Chronic, ongoing.  Continue Protonix daily and collaboration with GI.  He has visit with them next week to discuss swallowing issues.  Risks of PPI use were discussed with patient including bone loss, C. Diff diarrhea, pneumonia, infections, CKD, electrolyte abnormalities.  Pt. Verbalizes understanding and chooses to continue the medication.         Endocrine   CKD stage 3 due to type 2 diabetes mellitus (H[facility])    Chronic.  Stable on recent labs.  A1c today 6.7% with endo.  Continue Losartan for kidney protection.      Relevant Medications   atorvastatin (LIPITOR) 20 MG tablet   losartan (COZAAR) 100 MG tablet   Hyperlipidemia associated with type 2 diabetes mellitus (HCC)    Chronic, ongoing.  Continue current medication regimen and adjust as needed.  Lipid panel up to date.       Relevant Medications   atorvastatin (LIPITOR) 20 MG tablet   amLODipine (NORVASC) 10 MG tablet   carvedilol (COREG) 25 MG tablet   hydrochlorothiazide (HYDRODIURIL) 25 MG tablet   losartan (COZAAR) 100 MG tablet   Type 2 diabetes mellitus with both eyes affected by mild nonproliferative retinopathy without macular edema, with long-term current use of insulin ([place])    Refer to diabetes with proteinuria plan.      Relevant Medications   atorvastatin (LIPITOR) 20 MG tablet   losartan (COZAAR) 100 MG tablet   Type 2 diabetes mellitus with proteinuria (HCC) - Primary    Chronic, ongoing, followed by endocrinology with insulin pump in place.  Continue  current collaboration and pump settings per endo -- recent note reviewed, will defer pump changes to them.  A1c 6.7% with them -- urine ALB 29 July 2021, continue Losartan for kidney protection.  Recommend focus on diet and modest weight loss.  Does endorse poor diet on occasion.   Recommend he continue to monitor BS closely at home and if any consistent <70 immediately alert endo for pump changes.  Return in 6 months.      Relevant Medications   atorvastatin (LIPITOR) 20 MG tablet   losartan (COZAAR) 100 MG tablet     Other   BMI 40.0-44.9, adult ([place])    Recommend focus on modest weight loss with diet changes and regular exercise regimen.        Chronic pain syndrome    Chronic, ongoing to cervical spine.  Continue collaboration with pain management and neurosurgery on as needed basis, notes reviewed.  Gabapentin refills sent.      Relevant Medications   gabapentin (NEURONTIN) 600 MG tablet   Morbid obesity (HCC)    BMI 43.57 with T2DM, HTN.  Recommended eating smaller high protein, low fat meals more frequently and exercising 30 mins a day 5 times a week with a goal of 10-15lb weight loss in the next 3 months. Patient voiced their understanding and motivation to adhere to these recommendations.       Presence of insulin pump    Continue to collaborate with endocrinology.      Spinal stenosis, cervical region    Chronic, ongoing -- is [employer] with history of frequent repelling -- suspect this is underlying cause of issues.  Is unable to get service connection at [place] due to income.  At this time continue collaboration with Dr. Holley Raring at pain management and Dr. Lacinda Axon with neurosurgery.  May benefit from PT in future.         Follow up plan: Return in about 6 months (around 11/27/2022) for Annual physical -- after 11/24/22.

## 2022-05-26 NOTE — Assessment & Plan Note (Signed)
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  Is unable to get service connection at [place] due to income.  At this time continue collaboration with Dr. Holley Raring at pain management and Dr. Lacinda Axon with neurosurgery.  May benefit from PT in future.

## 2022-05-26 NOTE — Assessment & Plan Note (Signed)
Chronic.  Stable on recent labs.  A1c today 6.7% with endo.  Continue Losartan for kidney protection.

## 2022-05-26 NOTE — Assessment & Plan Note (Signed)
Chronic.  Noted on 09/16/19 imaging.  Continue daily statin and ASA for prevention + focus on healthy diet and modest weight loss.

## 2022-05-26 NOTE — Assessment & Plan Note (Signed)
BMI 43.57 with T2DM, HTN.  Recommended eating smaller high protein, low fat meals more frequently and exercising 30 mins a day 5 times a week with a goal of 10-15lb weight loss in the next 3 months. Patient voiced their understanding and motivation to adhere to these recommendations.

## 2022-05-26 NOTE — Assessment & Plan Note (Signed)
Chronic, ongoing.  Continue current medication regimen and adjust as needed.  Lipid panel up to date. 

## 2022-05-26 NOTE — Assessment & Plan Note (Signed)
Recommend focus on modest weight loss with diet changes and regular exercise regimen.

## 2022-05-26 NOTE — Assessment & Plan Note (Signed)
Chronic, ongoing.  Continue Protonix daily and collaboration with GI.  He has visit with them next week to discuss swallowing issues.  Risks of PPI use were discussed with patient including bone loss, C. Diff diarrhea, pneumonia, infections, CKD, electrolyte abnormalities.  Pt. Verbalizes understanding and chooses to continue the medication.

## 2022-05-26 NOTE — Assessment & Plan Note (Signed)
Chronic, ongoing.  Continues to use CPAP faithfully every night and denies any issues with this.  New supplies ordered.

## 2022-05-26 NOTE — Assessment & Plan Note (Signed)
Refer to diabetes with proteinuria plan.

## 2022-05-26 NOTE — Assessment & Plan Note (Signed)
Chronic, stable with BP at goal in office..  Recommend he continue to check BP at home twice daily and document for provider visits. Continue current medication regimen at this time, is on max dose multiple medications.  May need to adjust in future and change HCTZ if return of gout flares or decline in kidney function.  Labs: up to date with endo.  Return in 6 months for physical.

## 2022-05-26 NOTE — Assessment & Plan Note (Signed)
Chronic, ongoing to cervical spine.  Continue collaboration with pain management and neurosurgery on as needed basis, notes reviewed.  Gabapentin refills sent.

## 2022-05-26 NOTE — Assessment & Plan Note (Signed)
Continue to collaborate with endocrinology.

## 2022-05-26 NOTE — Assessment & Plan Note (Signed)
Chronic, ongoing, followed by endocrinology with insulin pump in place.  Continue current collaboration and pump settings per endo -- recent note reviewed, will defer pump changes to them.  A1c 6.7% with them -- urine ALB 29 July 2021, continue Losartan for kidney protection.  Recommend focus on diet and modest weight loss.  Does endorse poor diet on occasion.  Recommend he continue to monitor BS closely at home and if any consistent <70 immediately alert endo for pump changes.  Return in 6 months.

## 2022-05-30 DIAGNOSIS — K219 Gastro-esophageal reflux disease without esophagitis: Secondary | ICD-10-CM | POA: Diagnosis not present

## 2022-05-30 DIAGNOSIS — R159 Full incontinence of feces: Secondary | ICD-10-CM | POA: Diagnosis not present

## 2022-05-30 DIAGNOSIS — K449 Diaphragmatic hernia without obstruction or gangrene: Secondary | ICD-10-CM | POA: Diagnosis not present

## 2022-05-30 DIAGNOSIS — Z86010 Personal history of colonic polyps: Secondary | ICD-10-CM | POA: Diagnosis not present

## 2022-05-30 DIAGNOSIS — K5900 Constipation, unspecified: Secondary | ICD-10-CM | POA: Diagnosis not present

## 2022-06-06 ENCOUNTER — Telehealth: Payer: Self-pay

## 2022-06-06 NOTE — Telephone Encounter (Signed)
Paperwork faxed back to [facility]

## 2022-06-13 DIAGNOSIS — E669 Obesity, unspecified: Secondary | ICD-10-CM | POA: Diagnosis not present

## 2022-06-13 DIAGNOSIS — E1169 Type 2 diabetes mellitus with other specified complication: Secondary | ICD-10-CM | POA: Diagnosis not present

## 2022-06-20 DIAGNOSIS — Z9641 Presence of insulin pump (external) (internal): Secondary | ICD-10-CM | POA: Diagnosis not present

## 2022-06-20 DIAGNOSIS — E1169 Type 2 diabetes mellitus with other specified complication: Secondary | ICD-10-CM | POA: Diagnosis not present

## 2022-06-20 DIAGNOSIS — E669 Obesity, unspecified: Secondary | ICD-10-CM | POA: Diagnosis not present

## 2022-07-03 ENCOUNTER — Telehealth: Payer: Self-pay | Admitting: *Deleted

## 2022-07-03 NOTE — Patient Outreach (Signed)
  Care Coordination   Initial Visit Note   07/03/2022 Name: Aaron [name] MRN: [id number] DOB: [birth year]  Aaron [name] is a 72 y.o. year old male who sees [place], Henrine Screws T, NP for primary care. I spoke with  [name] by phone today.  What matters to the patients health and wellness today?  Continuing to keep chronic conditions well managed.  Current A1C is 6.3, state blood pressure is usually "good."  Admits he does not always follow diabetic diet, but state he is aware of what he is to do to keep A1C at goal.  Does not feel follow up is needed at this but agrees to call with questions.     Goals Addressed             This Visit's Progress    COMPLETED: Care Coordination Activities - No follow up needed       Care Coordination Interventions: Provided education to patient about basic DM disease process Reviewed medications with patient and discussed importance of medication adherence Discussed plans with patient for ongoing care management follow up and provided patient with direct contact information for care management team Reviewed scheduled/upcoming provider appointments including: Urology on 3/18, GI on 4/17, Endocrinology on 4/30, and PCP on 7/8 Advised patient, providing education and rationale, to check cbg 2-3 times a day and record, calling provider for findings outside established parameters Screening for signs and symptoms of depression related to chronic disease state  Assessed social determinant of health barriers          SDOH assessments and interventions completed:  Yes  SDOH Interventions Today    Flowsheet Row Most Recent Value  SDOH Interventions   Food Insecurity Interventions Intervention Not Indicated  Housing Interventions Intervention Not Indicated  Transportation Interventions Intervention Not Indicated        Care Coordination Interventions:  Yes, provided   Follow up plan: No further intervention required.   Encounter  Outcome:  Pt. Visit Completed   Valente David, RN, MSN, [place] [facility] Care Management Coordinator [phone]

## 2022-07-03 NOTE — Patient Instructions (Signed)
Visit Information  Thank you for taking time to visit with me today. Please don't hesitate to contact me if I can be of assistance to you.  Following are the goals we discussed today:  Continue monitoring blood sugar and blood pressure on regular basis, share with provider.  Adhere to diabetic diet.   Please call the Suicide and Crisis Lifeline: 988 call the [place] National Suicide Prevention Lifeline: 5035211818 or TTY: (367)563-6704 TTY 204-555-7272) to talk to a trained counselor call 1-800-273-TALK (toll free, 24 hour hotline) call 911 if you are experiencing a Mental Health or [place] or need someone to talk to.  Patient verbalizes understanding of instructions and care plan provided today and agrees to view in [place]. Active MyChart status and patient understanding of how to access instructions and care plan via MyChart confirmed with patient.     The patient has been provided with contact information for the care management team and has been advised to call with any health related questions or concerns.   Valente David, RN, MSN, [facility] Care Management Coordinator [phone]

## 2022-07-20 DIAGNOSIS — E11649 Type 2 diabetes mellitus with hypoglycemia without coma: Secondary | ICD-10-CM | POA: Diagnosis not present

## 2022-07-24 ENCOUNTER — Ambulatory Visit: Payer: Medicare HMO | Admitting: Urology

## 2022-07-30 DIAGNOSIS — E119 Type 2 diabetes mellitus without complications: Secondary | ICD-10-CM | POA: Diagnosis not present

## 2022-08-04 ENCOUNTER — Other Ambulatory Visit: Payer: Self-pay | Admitting: Family Medicine

## 2022-08-07 ENCOUNTER — Telehealth: Payer: Self-pay | Admitting: *Deleted

## 2022-08-07 ENCOUNTER — Encounter: Payer: Self-pay | Admitting: Urology

## 2022-08-07 ENCOUNTER — Ambulatory Visit: Payer: Medicare HMO | Admitting: Urology

## 2022-08-07 VITALS — BP 176/78 | HR 80 | Ht 75.0 in | Wt 325.0 lb

## 2022-08-07 DIAGNOSIS — R310 Gross hematuria: Secondary | ICD-10-CM | POA: Diagnosis not present

## 2022-08-07 DIAGNOSIS — Z8546 Personal history of malignant neoplasm of prostate: Secondary | ICD-10-CM

## 2022-08-07 DIAGNOSIS — Z87898 Personal history of other specified conditions: Secondary | ICD-10-CM | POA: Diagnosis not present

## 2022-08-07 DIAGNOSIS — N3040 Irradiation cystitis without hematuria: Secondary | ICD-10-CM

## 2022-08-07 LAB — URINALYSIS, COMPLETE
Bilirubin, UA: NEGATIVE
Glucose, UA: NEGATIVE
Ketones, UA: NEGATIVE
Leukocytes,UA: NEGATIVE
Nitrite, UA: NEGATIVE
Specific Gravity, UA: 1.025 (ref 1.005–1.030)
Urobilinogen, Ur: 1 mg/dL (ref 0.2–1.0)
pH, UA: 5.5 (ref 5.0–7.5)

## 2022-08-07 LAB — MICROSCOPIC EXAMINATION

## 2022-08-07 NOTE — Telephone Encounter (Signed)
-----   Message from Abbie Sons, MD sent at 08/07/2022  2:35 PM EDT ----- Regarding: CT I saw patient today for gross hematuria and schedule cystoscopy.  Please let him know would also like to get another CT urogram since it has been 3 years since his last one.  The order was placed.

## 2022-08-07 NOTE — Telephone Encounter (Signed)
Notified patient as instructed, patient pleased °

## 2022-08-07 NOTE — Progress Notes (Signed)
08/07/2022 9:48 AM   Aaron [name] [birth year] [id number]  Referring provider: Venita Lick, NP [address] [place],  [place] [address]  Chief Complaint  Patient presents with   Other    Urinary issues    HPI: 72 y.o. male presents for annual follow-up.  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 Recently has had constipation and noted intermittent mild gross hematuria PSA 11/23/2021 remains undetectable at <0.1   PMH: Past Medical History:  Diagnosis Date   Cancer [facility]) 2013   prostate (radiation and surgery)   Diabetes mellitus without complication ([place])    Gout    Hyperlipidemia    Hypertension    Sleep apnea    Spinal stenosis in cervical region 06/03/2020    Surgical History: Past Surgical History:  Procedure Laterality Date   CIRCUMCISION     COLONOSCOPY N/A 06/22/2021   Procedure: COLONOSCOPY;  Surgeon: Toledo, Benay Pike, MD;  Location: [facility] ENDOSCOPY;  Service: Gastroenterology;  Laterality: N/A;  IDDM   CYST EXCISION Left 2015   arm   deviated septrum  2003   JOINT REPLACEMENT Left 2016   Hip   PROSTATE SURGERY     prostatectomy due to cancer   PROSTATECTOMY  2013   TONSILLECTOMY AND ADENOIDECTOMY  2004   TOTAL HIP ARTHROPLASTY Left 2016   TOTAL HIP ARTHROPLASTY Right 11/27/2017   Procedure: TOTAL HIP ARTHROPLASTY ANTERIOR APPROACH;  Surgeon: Hessie Knows, MD;  Location: [facility] ORS;  Service: Orthopedics;  Laterality: Right;    Home Medications:  Allergies as of 08/07/2022       Reactions   Glucophage [metformin] Diarrhea        Medication List        Accurate as of August 07, 2022  9:48 AM. If you have any questions, ask your nurse or doctor.          Accu-Chek Guide test strip Generic drug: glucose blood   allopurinol 300 MG tablet Commonly known as: ZYLOPRIM Take 1 tablet (300 mg total) by mouth daily.   amLODipine 10 MG tablet Commonly  known as: NORVASC Take 1 tablet (10 mg total) by mouth daily.   ASPIRIN 81 PO every evening.   atorvastatin 20 MG tablet Commonly known as: LIPITOR Take 1 tablet (20 mg total) by mouth daily.   carvedilol 25 MG tablet Commonly known as: COREG TAKE 1 TABLET TWICE DAILY WITH A MEAL   gabapentin 600 MG tablet Commonly known as: Neurontin Take 1-1.5 tablets (600-900 mg total) by mouth at bedtime.   HumaLOG KwikPen 200 UNIT/ML KwikPen Generic drug: insulin lispro Inject into the skin.   hydrochlorothiazide 25 MG tablet Commonly known as: HYDRODIURIL Take 1 tablet (25 mg total) by mouth daily.   insulin aspart 100 UNIT/ML injection Commonly known as: novoLOG Inject into the skin. Using via insulin pump  Medtronic MiniMed 630G insulin pump   insulin pump Soln Inject into the skin.   losartan 100 MG tablet Commonly known as: COZAAR Take 1 tablet (100 mg total) by mouth daily.   pantoprazole 20 MG tablet Commonly known as: PROTONIX TAKE 1 TABLET(20 MG) BY MOUTH EVERY DAY   tamsulosin 0.4 MG Caps capsule Commonly known as: FLOMAX Take 1 capsule (0.4 mg total) by mouth daily.        Allergies:  Allergies  Allergen Reactions   Glucophage [Metformin] Diarrhea    Family History: Family History  Problem  Relation Age of Onset   Cancer Mother    Breast cancer Mother    Stroke Father    Cancer Sister    Cancer Maternal Uncle    Stroke Maternal Uncle     Social History:  reports that he has never smoked. He has never used smokeless tobacco. He reports that he does not drink alcohol and does not use drugs.   Physical Exam: BP (!) 176/78   Pulse 80   Ht 6\' 3"  (1.905 m)   Wt (!) 325 lb (147.4 kg)   BMI 40.62 kg/m   Constitutional:  Alert and oriented, No acute distress. HEENT: [name] AT, moist mucus membranes.  Trachea midline, no masses. Cardiovascular: No clubbing, cyanosis, or edema. Respiratory: Normal respiratory effort, no increased work of  breathing.  Laboratory Data:  Urinalysis Microscopy of 11-30 RBC  Assessment & Plan:    1.  Gross hematuria Most likely secondary to radiation cystitis 3 years since last evaluation and recommend CT urogram/cystoscopy  2.  Radiation cystitis  3.  History of prostate cancer PSA remains under double   Abbie Sons, MD  [facility] [address] [place], [place] [address] [phone]

## 2022-08-22 ENCOUNTER — Ambulatory Visit
Admission: RE | Admit: 2022-08-22 | Discharge: 2022-08-22 | Disposition: A | Discharge status: Home / Self Care | Payer: Medicare HMO | Source: Ambulatory Visit | Attending: Urology | Admitting: Urology

## 2022-08-22 DIAGNOSIS — K5730 Diverticulosis of large intestine without perforation or abscess without bleeding: Secondary | ICD-10-CM | POA: Diagnosis not present

## 2022-08-22 DIAGNOSIS — R310 Gross hematuria: Secondary | ICD-10-CM

## 2022-08-22 DIAGNOSIS — N200 Calculus of kidney: Secondary | ICD-10-CM | POA: Diagnosis not present

## 2022-08-22 DIAGNOSIS — K8020 Calculus of gallbladder without cholecystitis without obstruction: Secondary | ICD-10-CM | POA: Diagnosis not present

## 2022-08-22 LAB — POCT I-STAT CREATININE: Creatinine, Ser: 1.2 mg/dL (ref 0.61–1.24)

## 2022-08-22 MED ORDER — IOHEXOL 350 MG/ML SOLN
100.0000 mL | Freq: Once | INTRAVENOUS | Status: AC | PRN
  Administered 2022-08-22: 100 mL via INTRAVENOUS

## 2022-08-25 ENCOUNTER — Encounter: Payer: Self-pay | Admitting: Urology

## 2022-08-25 ENCOUNTER — Ambulatory Visit: Payer: Medicare HMO | Admitting: Urology

## 2022-08-25 VITALS — BP 195/86 | HR 83 | Ht 75.0 in | Wt 325.0 lb

## 2022-08-25 DIAGNOSIS — R310 Gross hematuria: Secondary | ICD-10-CM | POA: Diagnosis not present

## 2022-08-25 LAB — URINALYSIS, COMPLETE
Bilirubin, UA: NEGATIVE
Glucose, UA: NEGATIVE
Ketones, UA: NEGATIVE
Leukocytes,UA: NEGATIVE
Nitrite, UA: NEGATIVE
Protein,UA: NEGATIVE
Specific Gravity, UA: 1.02 (ref 1.005–1.030)
Urobilinogen, Ur: 1 mg/dL (ref 0.2–1.0)
pH, UA: 6 (ref 5.0–7.5)

## 2022-08-25 LAB — MICROSCOPIC EXAMINATION: Bacteria, UA: NONE SEEN

## 2022-08-25 NOTE — Progress Notes (Signed)
Aaron [name] presents for an office/procedure visit. BP today is ____high_______. Hes complaint/noncompliant with BP medication. Greater than 140/90. Provider  notified. Pt advised to______see PCP ________. Pt voiced understanding.

## 2022-08-25 NOTE — Progress Notes (Signed)
   08/25/22  CC:  Chief Complaint  Patient presents with   Cysto    HPI: Refer to prior note 08/07/2022.  Denies recurrent gross hematuria.  CT urogram 08/22/2022 showed a 1-2 mm nonobstructing right renal calculus and no upper tract abnormalities.  UA today 3-10 RBC  Blood pressure (!) 195/86, pulse 83, height 6\' 3"  (1.905 m), weight (!) 325 lb (147.4 kg). NED. A&Ox3.   No respiratory distress   Abd soft, NT, ND Normal phallus with bilateral descended testicles  Cystoscopy Procedure Note  Patient identification was confirmed, informed consent was obtained, and patient was prepped using Betadine solution.  Lidocaine jelly was administered per urethral meatus.     Pre-Procedure: - Inspection reveals a normal caliber urethral meatus.  Procedure: The flexible cystoscope was introduced without difficulty - No urethral strictures/lesions are present. - Surgically absent prostate  - Normal bladder neck - Bilateral ureteral orifices identified - Bladder mucosa reveals patchy areas of pale mucosa with p.o. angiectasia and friability more prevalent at the bladder base/neck region.  No solid or papillary tumors identified - No bladder stones - No trabeculation  Retroflexion shows mucosal changes as above   Post-Procedure: - Patient tolerated the procedure well  Assessment/ Plan: Cystoscopic findings consistent with radiation cystitis Urine cytology sent We discussed consideration for hyperbaric oxygen therapy if hematuria worsens 1 year follow-up with UA instructed to call earlier for increasing hematuria    Riki Altes, MD

## 2022-08-28 LAB — CYTOLOGY - NON PAP

## 2022-08-29 ENCOUNTER — Encounter: Payer: Self-pay | Admitting: *Deleted

## 2022-09-04 DIAGNOSIS — E119 Type 2 diabetes mellitus without complications: Secondary | ICD-10-CM | POA: Diagnosis not present

## 2022-09-04 DIAGNOSIS — H524 Presbyopia: Secondary | ICD-10-CM | POA: Diagnosis not present

## 2022-09-04 DIAGNOSIS — H5203 Hypermetropia, bilateral: Secondary | ICD-10-CM | POA: Diagnosis not present

## 2022-09-04 DIAGNOSIS — H40013 Open angle with borderline findings, low risk, bilateral: Secondary | ICD-10-CM | POA: Diagnosis not present

## 2022-09-04 DIAGNOSIS — H04213 Epiphora due to excess lacrimation, bilateral lacrimal glands: Secondary | ICD-10-CM | POA: Diagnosis not present

## 2022-09-04 DIAGNOSIS — H2513 Age-related nuclear cataract, bilateral: Secondary | ICD-10-CM | POA: Diagnosis not present

## 2022-09-04 DIAGNOSIS — H52223 Regular astigmatism, bilateral: Secondary | ICD-10-CM | POA: Diagnosis not present

## 2022-09-04 DIAGNOSIS — H43813 Vitreous degeneration, bilateral: Secondary | ICD-10-CM | POA: Diagnosis not present

## 2022-09-04 DIAGNOSIS — Z7984 Long term (current) use of oral hypoglycemic drugs: Secondary | ICD-10-CM | POA: Diagnosis not present

## 2022-09-04 LAB — HM DIABETES EYE EXAM

## 2022-09-05 ENCOUNTER — Encounter: Payer: Self-pay | Admitting: Nurse Practitioner

## 2022-09-06 DIAGNOSIS — R159 Full incontinence of feces: Secondary | ICD-10-CM | POA: Diagnosis not present

## 2022-09-06 DIAGNOSIS — K5900 Constipation, unspecified: Secondary | ICD-10-CM | POA: Diagnosis not present

## 2022-09-06 DIAGNOSIS — Z86010 Personal history of colonic polyps: Secondary | ICD-10-CM | POA: Diagnosis not present

## 2022-09-06 DIAGNOSIS — R1314 Dysphagia, pharyngoesophageal phase: Secondary | ICD-10-CM | POA: Diagnosis not present

## 2022-09-06 DIAGNOSIS — K449 Diaphragmatic hernia without obstruction or gangrene: Secondary | ICD-10-CM | POA: Diagnosis not present

## 2022-09-06 DIAGNOSIS — K219 Gastro-esophageal reflux disease without esophagitis: Secondary | ICD-10-CM | POA: Diagnosis not present

## 2022-09-12 DIAGNOSIS — E669 Obesity, unspecified: Secondary | ICD-10-CM | POA: Diagnosis not present

## 2022-09-12 DIAGNOSIS — E1169 Type 2 diabetes mellitus with other specified complication: Secondary | ICD-10-CM | POA: Diagnosis not present

## 2022-09-12 LAB — HEMOGLOBIN A1C: Hemoglobin A1C: 7.4

## 2022-09-19 DIAGNOSIS — Z9641 Presence of insulin pump (external) (internal): Secondary | ICD-10-CM | POA: Diagnosis not present

## 2022-09-19 DIAGNOSIS — E113293 Type 2 diabetes mellitus with mild nonproliferative diabetic retinopathy without macular edema, bilateral: Secondary | ICD-10-CM | POA: Diagnosis not present

## 2022-09-19 DIAGNOSIS — Z794 Long term (current) use of insulin: Secondary | ICD-10-CM | POA: Diagnosis not present

## 2022-09-25 DIAGNOSIS — H2513 Age-related nuclear cataract, bilateral: Secondary | ICD-10-CM | POA: Diagnosis not present

## 2022-09-25 DIAGNOSIS — H2511 Age-related nuclear cataract, right eye: Secondary | ICD-10-CM | POA: Diagnosis not present

## 2022-09-25 DIAGNOSIS — Z794 Long term (current) use of insulin: Secondary | ICD-10-CM | POA: Diagnosis not present

## 2022-09-25 DIAGNOSIS — H25013 Cortical age-related cataract, bilateral: Secondary | ICD-10-CM | POA: Diagnosis not present

## 2022-09-25 DIAGNOSIS — H25043 Posterior subcapsular polar age-related cataract, bilateral: Secondary | ICD-10-CM | POA: Diagnosis not present

## 2022-09-25 DIAGNOSIS — E119 Type 2 diabetes mellitus without complications: Secondary | ICD-10-CM | POA: Diagnosis not present

## 2022-09-25 DIAGNOSIS — H40013 Open angle with borderline findings, low risk, bilateral: Secondary | ICD-10-CM | POA: Diagnosis not present

## 2022-10-09 DIAGNOSIS — E11649 Type 2 diabetes mellitus with hypoglycemia without coma: Secondary | ICD-10-CM | POA: Diagnosis not present

## 2022-10-20 DIAGNOSIS — H2511 Age-related nuclear cataract, right eye: Secondary | ICD-10-CM | POA: Diagnosis not present

## 2022-10-20 DIAGNOSIS — H2512 Age-related nuclear cataract, left eye: Secondary | ICD-10-CM | POA: Diagnosis not present

## 2022-10-20 DIAGNOSIS — H25011 Cortical age-related cataract, right eye: Secondary | ICD-10-CM | POA: Diagnosis not present

## 2022-10-28 DIAGNOSIS — E119 Type 2 diabetes mellitus without complications: Secondary | ICD-10-CM | POA: Diagnosis not present

## 2022-11-20 DIAGNOSIS — H2512 Age-related nuclear cataract, left eye: Secondary | ICD-10-CM | POA: Diagnosis not present

## 2022-11-20 DIAGNOSIS — H25012 Cortical age-related cataract, left eye: Secondary | ICD-10-CM | POA: Diagnosis not present

## 2022-11-25 NOTE — Patient Instructions (Signed)
Be Involved in Your Health Care:  Taking Medications When medications are taken as directed, they can greatly improve your health. But if they are not taken as instructed, they may not work. In some cases, not taking them correctly can be harmful. To help ensure your treatment remains effective and safe, understand your medications and how to take them.  Your lab results, notes and after visit summary will be available on My Chart. We strongly encourage you to use this feature. If lab results are abnormal the clinic will contact you with the appropriate steps. If the clinic does not contact you assume the results are satisfactory. You can always see your results on My Chart. If you have questions regarding your condition, please contact the clinic during office hours. You can also ask questions on My Chart.  We at [name] are grateful that you chose us to provide care. We strive to provide excellent and compassionate care and are always looking for feedback. If you get a survey from the clinic please complete this.   Diabetes Mellitus Basics  Diabetes mellitus, or diabetes, is a long-term (chronic) disease. It occurs when the body does not properly use sugar (glucose) that is released from food after you eat. Diabetes mellitus may be caused by one or both of these problems: Your pancreas does not make enough of a hormone called insulin. Your body does not react in a normal way to the insulin that it makes. Insulin lets glucose enter cells in your body. This gives you energy. If you have diabetes, glucose cannot get into cells. This causes high blood glucose (hyperglycemia). How to treat and manage diabetes You may need to take insulin or other diabetes medicines daily to keep your glucose in balance. If you are prescribed insulin, you will learn how to give yourself insulin by injection. You may need to adjust the amount of insulin you take based on the foods that you eat. You will  need to check your blood glucose levels using a glucose monitor as told by your health care provider. The readings can help determine if you have low or high blood glucose. Generally, you should have these blood glucose levels: Before meals (preprandial): 80-130 mg/dL (4.4-7.2 mmol/L). After meals (postprandial): below 180 mg/dL (10 mmol/L). Hemoglobin A1c (HbA1c) level: less than 7%. Your health care provider will set treatment goals for you. Keep all follow-up visits. This is important. Follow these instructions at home: Diabetes medicines Take your diabetes medicines every day as told by your health care provider. List your diabetes medicines here: Name of medicine: ______________________________ Amount (dose): _______________ Time (a.m./p.m.): _______________ Notes: ___________________________________ Name of medicine: ______________________________ Amount (dose): _______________ Time (a.m./p.m.): _______________ Notes: ___________________________________ Name of medicine: ______________________________ Amount (dose): _______________ Time (a.m./p.m.): _______________ Notes: ___________________________________ Insulin If you use insulin, list the types of insulin you use here: Insulin type: ______________________________ Amount (dose): _______________ Time (a.m./p.m.): _______________Notes: ___________________________________ Insulin type: ______________________________ Amount (dose): _______________ Time (a.m./p.m.): _______________ Notes: ___________________________________ Insulin type: ______________________________ Amount (dose): _______________ Time (a.m./p.m.): _______________ Notes: ___________________________________ Insulin type: ______________________________ Amount (dose): _______________ Time (a.m./p.m.): _______________ Notes: ___________________________________ Insulin type: ______________________________ Amount (dose): _______________ Time (a.m./p.m.): _______________  Notes: ___________________________________ Managing blood glucose  Check your blood glucose levels using a glucose monitor as told by your health care provider. Write down the times that you check your glucose levels here: Time: _______________ Notes: ___________________________________ Time: _______________ Notes: ___________________________________ Time: _______________ Notes: ___________________________________ Time: _______________ Notes: ___________________________________ Time: _______________ Notes: ___________________________________ Time: _______________ Notes: ___________________________________    Low blood glucose Low blood glucose (hypoglycemia) is when glucose is at or below 70 mg/dL (3.9 mmol/L). Symptoms may include: Feeling: Hungry. Sweaty and clammy. Irritable or easily upset. Dizzy. Sleepy. Having: A fast heartbeat. A headache. A change in your vision. Numbness around the mouth, lips, or tongue. Having trouble with: Moving (coordination). Sleeping. Treating low blood glucose To treat low blood glucose, eat or drink something containing sugar right away. If you can think clearly and swallow safely, follow the 15:15 rule: Take 15 grams of a fast-acting carb (carbohydrate), as told by your health care provider. Some fast-acting carbs are: Glucose tablets: take 3-4 tablets. Hard candy: eat 3-5 pieces. Fruit juice: drink 4 oz (120 mL). Regular (not diet) soda: drink 4-6 oz (120-180 mL). Honey or sugar: eat 1 Tbsp (15 mL). Check your blood glucose levels 15 minutes after you take the carb. If your glucose is still at or below 70 mg/dL (3.9 mmol/L), take 15 grams of a carb again. If your glucose does not go above 70 mg/dL (3.9 mmol/L) after 3 tries, get help right away. After your glucose goes back to normal, eat a meal or a snack within 1 hour. Treating very low blood glucose If your glucose is at or below 54 mg/dL (3 mmol/L), you have very low blood glucose  (severe hypoglycemia). This is an emergency. Do not wait to see if the symptoms will go away. Get medical help right away. Call your local emergency services (911 in the [place] Do not drive yourself to the hospital. Questions to ask your health care provider Should I talk with a diabetes educator? What equipment will I need to care for myself at home? What diabetes medicines do I need? When should I take them? How often do I need to check my blood glucose levels? What number can I call if I have questions? When is my follow-up visit? Where can I find a support group for people with diabetes? Where to find more information American Diabetes Association: www.diabetes.org Association of Diabetes Care and Education Specialists: www.diabeteseducator.org Contact a health care provider if: Your blood glucose is at or above 240 mg/dL (13.3 mmol/L) for 2 days in a row. You have been sick or have had a fever for 2 days or more, and you are not getting better. You have any of these problems for more than 6 hours: You cannot eat or drink. You feel nauseous. You vomit. You have diarrhea. Get help right away if: Your blood glucose is lower than 54 mg/dL (3 mmol/L). You get confused. You have trouble thinking clearly. You have trouble breathing. These symptoms may represent a serious problem that is an emergency. Do not wait to see if the symptoms will go away. Get medical help right away. Call your local emergency services (911 in the [place] Do not drive yourself to the hospital. Summary Diabetes mellitus is a chronic disease that occurs when the body does not properly use sugar (glucose) that is released from food after you eat. Take insulin and diabetes medicines as told. Check your blood glucose every day, as often as told. Keep all follow-up visits. This is important. This information is not intended to replace advice given to you by your health care provider. Make sure you discuss any  questions you have with your health care provider. Document Revised: 09/09/2019 Document Reviewed: 09/09/2019 Elsevier Patient Education  2024 Elsevier Inc.  

## 2022-11-27 ENCOUNTER — Ambulatory Visit (INDEPENDENT_AMBULATORY_CARE_PROVIDER_SITE_OTHER): Payer: Medicare HMO | Admitting: Nurse Practitioner

## 2022-11-27 ENCOUNTER — Other Ambulatory Visit: Payer: Self-pay

## 2022-11-27 ENCOUNTER — Encounter: Payer: Self-pay | Admitting: Nurse Practitioner

## 2022-11-27 VITALS — BP 136/68 | HR 70 | Temp 98.4°F | Ht 75.0 in | Wt 332.2 lb

## 2022-11-27 DIAGNOSIS — N3040 Irradiation cystitis without hematuria: Secondary | ICD-10-CM

## 2022-11-27 DIAGNOSIS — E1122 Type 2 diabetes mellitus with diabetic chronic kidney disease: Secondary | ICD-10-CM | POA: Diagnosis not present

## 2022-11-27 DIAGNOSIS — E113293 Type 2 diabetes mellitus with mild nonproliferative diabetic retinopathy without macular edema, bilateral: Secondary | ICD-10-CM

## 2022-11-27 DIAGNOSIS — K219 Gastro-esophageal reflux disease without esophagitis: Secondary | ICD-10-CM

## 2022-11-27 DIAGNOSIS — Z0000 Encounter for general adult medical examination without abnormal findings: Secondary | ICD-10-CM | POA: Diagnosis not present

## 2022-11-27 DIAGNOSIS — Z8546 Personal history of malignant neoplasm of prostate: Secondary | ICD-10-CM

## 2022-11-27 DIAGNOSIS — I152 Hypertension secondary to endocrine disorders: Secondary | ICD-10-CM | POA: Diagnosis not present

## 2022-11-27 DIAGNOSIS — R310 Gross hematuria: Secondary | ICD-10-CM

## 2022-11-27 DIAGNOSIS — Z794 Long term (current) use of insulin: Secondary | ICD-10-CM

## 2022-11-27 DIAGNOSIS — N183 Chronic kidney disease, stage 3 unspecified: Secondary | ICD-10-CM

## 2022-11-27 DIAGNOSIS — E1169 Type 2 diabetes mellitus with other specified complication: Secondary | ICD-10-CM | POA: Diagnosis not present

## 2022-11-27 DIAGNOSIS — E1159 Type 2 diabetes mellitus with other circulatory complications: Secondary | ICD-10-CM | POA: Diagnosis not present

## 2022-11-27 DIAGNOSIS — Z6841 Body Mass Index (BMI) 40.0 and over, adult: Secondary | ICD-10-CM | POA: Diagnosis not present

## 2022-11-27 DIAGNOSIS — G894 Chronic pain syndrome: Secondary | ICD-10-CM

## 2022-11-27 DIAGNOSIS — E1129 Type 2 diabetes mellitus with other diabetic kidney complication: Secondary | ICD-10-CM

## 2022-11-27 DIAGNOSIS — M1A0720 Idiopathic chronic gout, left ankle and foot, without tophus (tophi): Secondary | ICD-10-CM

## 2022-11-27 DIAGNOSIS — Z9641 Presence of insulin pump (external) (internal): Secondary | ICD-10-CM

## 2022-11-27 DIAGNOSIS — G4733 Obstructive sleep apnea (adult) (pediatric): Secondary | ICD-10-CM

## 2022-11-27 DIAGNOSIS — R809 Proteinuria, unspecified: Secondary | ICD-10-CM | POA: Diagnosis not present

## 2022-11-27 DIAGNOSIS — E785 Hyperlipidemia, unspecified: Secondary | ICD-10-CM | POA: Diagnosis not present

## 2022-11-27 DIAGNOSIS — I700 Atherosclerosis of aorta: Secondary | ICD-10-CM

## 2022-11-27 LAB — MICROALBUMIN, URINE WAIVED
Creatinine, Urine Waived: 100 mg/dL (ref 10–300)
Microalb, Ur Waived: 80 mg/L — ABNORMAL HIGH (ref 0–19)

## 2022-11-27 NOTE — Assessment & Plan Note (Signed)
Continue to collaborate with endocrinology. 

## 2022-11-27 NOTE — Assessment & Plan Note (Signed)
Chronic, ongoing.  Continue current medication regimen and adjust as needed.  Lipid panel today and CMP. 

## 2022-11-27 NOTE — Assessment & Plan Note (Signed)
Chronic, ongoing to cervical spine.  Continue collaboration with pain management and neurosurgery on as needed basis, notes reviewed.  Gabapentin refills up to date.

## 2022-11-27 NOTE — Assessment & Plan Note (Addendum)
Chronic, ongoing.  Continues to use CPAP faithfully every night and denies any issues with this.

## 2022-11-27 NOTE — Assessment & Plan Note (Signed)
Chronic.  Noted on 09/16/19 imaging.  Continue daily statin and ASA for prevention + focus on healthy diet and modest weight loss. 

## 2022-11-27 NOTE — Assessment & Plan Note (Addendum)
BMI 41.52 with T2DM, HTN.  Recommended eating smaller high protein, low fat meals more frequently and exercising 30 mins a day 5 times a week with a goal of 10-15lb weight loss in the next 3 months. Patient voiced their understanding and motivation to adhere to these recommendations.

## 2022-11-27 NOTE — Assessment & Plan Note (Signed)
Has been stable for 2 years, will recheck today and is remains stable with discontinue this diagnosis.  Urine ALB 80 July 2024, maintain ARB on board.

## 2022-11-27 NOTE — Assessment & Plan Note (Signed)
Continue to collaborate with urology as needed.  PSA obtained today.

## 2022-11-27 NOTE — Progress Notes (Signed)
BP 136/68 (BP Location: Left Arm, Patient Position: Sitting, Cuff Size: Large)   Pulse 70   Temp 98.4 F (36.9 C) (Oral)   Ht 6\' 3"  (1.905 m)   Wt (!) 332 lb 3.2 oz (150.7 kg)   SpO2 98%   BMI 41.52 kg/m    Subjective:    Patient ID: Aaron [name], male    DOB: [birth year], 72 y.o.   MRN: [id number]  HPI: TALUS SCARFO is a 72 y.o. male presenting on 11/27/2022 for comprehensive medical examination. Current medical complaints include:none  He currently lives with: wife and grandchildren  Interim Problems from his last visit: no  DIABETES Followed by Dr. Tedd Sias with last visit 09/12/22, A1c at time 7.4%.  Continues on Gabapentin 600 MG for chronic pain to neck area, saw pain clinic in past.  Did physical therapy in past without benefit.  No history of injections. Neurosurgery seen in past, they recommended against surgery for him.   Uses Tandem T:slim insulin pump (04/2020 placed), per Dr. Tedd Sias last note 09/12/22: "12 am 2.45 units/hr 20 7.6 110 4 am 1.85 units/hr 20 7.6 110 8:30 am 1.1 units/hr 20 7.6 110 11 am 1.5 units.hr 20 5.6 110 8 pm 2.2 units/hr 20 5.6 110  24-hr basal = 43.175 "units" Humalog U200 insulin (equals 86.35 units of U100 insulin)" Hypoglycemic episodes:no Polydipsia/polyuria: no Visual disturbance: no Chest pain: no Paresthesias: no Glucose Monitoring: no  Accucheck frequency:  CGM  Fasting glucose: 100 to 140  Post prandial:  Evening:  Before meals: Taking Insulin?: yes -- insulin pump  Long acting insulin:  Short acting insulin: Blood Pressure Monitoring: daily Retinal Examination: Up to Date Foot Exam: Performed today Pneumovax: Up to Date Influenza: Up to Date Aspirin: yes   HYPERTENSION / HYPERLIPIDEMIA Continues on Losartan 100 MG, HCTZ 25 MG daily, Carvedilol 25 MG BID, Amlodipine 10 MG daily.  Takes Atorvastatin 20 MG daily.     Has OSA and uses CPAP every night.  History on 09/16/19 imaging aortic atherosclerosis noted. Satisfied  with current treatment? yes Duration of hypertension: years BP monitoring frequency: daily BP range: often 120-130/70 BP medication side effects: no Duration of hyperlipidemia: years Cholesterol medication side effects: no Cholesterol supplements: none Medication compliance: excellent compliance Aspirin: yes Recent stressors: no Recurrent headaches: no Visual changes: no Palpitations: no Dyspnea: no Chest pain: no Lower extremity edema: yes -- at baseline Dizzy/lightheaded: no   CHRONIC KIDNEY DISEASE Stable on recent labs in normal ranges, has been stable for 2 years.  History of prostate cancer with removal years ago and stable PSA ongoing.  Stopped Protonix years ago. CKD status: stable Medications renally dose: no Previous renal evaluation: no Pneumovax:  Up to Date Influenza Vaccine:  Up to Date   GOUT Continues on Allopurinol. Has not had a flare in many years. Duration:chronic Swelling: no Redness: no Trauma: no Recent dietary change or indiscretion: no Fevers: no Nausea/vomiting: no Aggravating factors: Alleviating factors:  Status:  better Treatments attempted:   Functional Status Survey: Is the patient deaf or have difficulty hearing?: No Does the patient have difficulty seeing, even when wearing glasses/contacts?: No Does the patient have difficulty concentrating, remembering, or making decisions?: No Does the patient have difficulty walking or climbing stairs?: No Does the patient have difficulty dressing or bathing?: No Does the patient have difficulty doing errands alone such as visiting a doctor's office or shopping?: No     02/13/2021   10:41 AM 06/22/2021    8:07  AM 02/14/2022    9:08 AM 05/26/2022    9:03 AM 11/27/2022    8:50 AM  Fall Risk  Falls in the past year? 0  0 0 0  Was there an injury with Fall? 0  0 0 0  Fall Risk Category Calculator 0  0 0 0  Fall Risk Category (Retired) Low  Low Low   (RETIRED) Patient Fall Risk Level Low fall risk  Moderate fall risk Moderate fall risk    Patient at Risk for Falls Due to No Fall Risks    No Fall Risks  Fall risk Follow up Falls evaluation completed  Falls evaluation completed;Education provided;Falls prevention discussed Falls evaluation completed Falls evaluation completed       11/27/2022    8:50 AM 05/26/2022    9:03 AM 02/14/2022    9:12 AM 11/23/2021    9:44 AM 08/04/2021    8:34 AM  Depression screen PHQ 2/9  Decreased Interest 0 0 0 0 0  Down, Depressed, Hopeless 0 0 0 0 0  PHQ - 2 Score 0 0 0 0 0  Altered sleeping 0 0 0 0 0  Tired, decreased energy 0 0 0 0 0  Change in appetite 0 0 0 0 0  Feeling bad or failure about yourself  0 0 0 0 0  Trouble concentrating 0 0 0 0 0  Moving slowly or fidgety/restless 0 0 0 0 0  Suicidal thoughts 0 0 0 0 0  PHQ-9 Score 0 0 0 0 0  Difficult doing work/chores Not difficult at all Not difficult at all Not difficult at all Not difficult at all        11/27/2022    8:50 AM 05/26/2022    9:03 AM 11/23/2021    9:44 AM 08/04/2021    8:34 AM  GAD 7 : Generalized Anxiety Score  Nervous, Anxious, on Edge 0 0 0 0  Control/stop worrying 0 0 0 0  Worry too much - different things 0 0 0 0  Trouble relaxing 0 0 0 0  Restless 0 0 0 0  Easily annoyed or irritable 0 0 0 0  Afraid - awful might happen 0 0 0 0  Total GAD 7 Score 0 0 0 0  Anxiety Difficulty Not difficult at all Not difficult at all Not difficult at all Not difficult at all     Advanced Directives <no information>  Past Medical History:  Past Medical History:  Diagnosis Date   Cancer [facility]) 2013   prostate (radiation and surgery)   Diabetes mellitus without complication (HCC)    Gout    Hyperlipidemia    Hypertension    Sleep apnea    Spinal stenosis in cervical region 06/03/2020   Surgical History:  Past Surgical History:  Procedure Laterality Date   CIRCUMCISION     COLONOSCOPY N/A 06/22/2021   Procedure: COLONOSCOPY;  Surgeon: Toledo, Boykin Nearing, MD;  Location: [facility] ENDOSCOPY;   Service: Gastroenterology;  Laterality: N/A;  IDDM   CYST EXCISION Left 2015   arm   deviated septrum  2003   JOINT REPLACEMENT Left 2016   Hip   PROSTATE SURGERY     prostatectomy due to cancer   PROSTATECTOMY  2013   TONSILLECTOMY AND ADENOIDECTOMY  2004   TOTAL HIP ARTHROPLASTY Left 2016   TOTAL HIP ARTHROPLASTY Right 11/27/2017   Procedure: TOTAL HIP ARTHROPLASTY ANTERIOR APPROACH;  Surgeon: Kennedy Bucker, MD;  Location: [facility] ORS;  Service: Orthopedics;  Laterality: Right;  Medications:  Current Outpatient Medications on File Prior to Visit  Medication Sig   ACCU-CHEK GUIDE test strip    allopurinol (ZYLOPRIM) 300 MG tablet Take 1 tablet (300 mg total) by mouth daily.   amLODipine (NORVASC) 10 MG tablet Take 1 tablet (10 mg total) by mouth daily.   ASPIRIN 81 PO every evening.    atorvastatin (LIPITOR) 20 MG tablet Take 1 tablet (20 mg total) by mouth daily.   carvedilol (COREG) 25 MG tablet TAKE 1 TABLET TWICE DAILY WITH A MEAL   gabapentin (NEURONTIN) 600 MG tablet Take 1-1.5 tablets (600-900 mg total) by mouth at bedtime.   gatifloxacin (ZYMAXID) 0.5 % SOLN Place 1 drop into both eyes 4 (four) times daily.   hydrochlorothiazide (HYDRODIURIL) 25 MG tablet Take 1 tablet (25 mg total) by mouth daily.   insulin aspart (NOVOLOG) 100 UNIT/ML injection Inject into the skin. Using via insulin pump  Medtronic MiniMed 630G insulin pump   Insulin Human (INSULIN PUMP) SOLN Inject into the skin.   insulin lispro (HUMALOG KWIKPEN) 200 UNIT/ML KwikPen Inject into the skin.   ketorolac (ACULAR) 0.5 % ophthalmic solution Place 1 drop into the left eye 4 (four) times daily.   losartan (COZAAR) 100 MG tablet Take 1 tablet (100 mg total) by mouth daily.   prednisoLONE acetate (PRED FORTE) 1 % ophthalmic suspension Place 1 drop into the left eye 4 (four) times daily.   tamsulosin (FLOMAX) 0.4 MG CAPS capsule Take 1 capsule (0.4 mg total) by mouth daily.   No current facility-administered  medications on file prior to visit.    Allergies:  Allergies  Allergen Reactions   Glucophage [Metformin] Diarrhea    Social History:  Social History   Socioeconomic History   Marital status: Married    Spouse name: Not on file   Number of children: Not on file   Years of education: Not on file   Highest education level: Some college, no degree  Occupational History   Occupation: retired  Tobacco Use   Smoking status: Never   Smokeless tobacco: Never  Vaping Use   Vaping Use: Never used  Substance and Sexual Activity   Alcohol use: No   Drug use: No   Sexual activity: Yes  Other Topics Concern   Not on file  Social History Narrative   Not on file   Social Determinants of Health   Financial Resource Strain: Low Risk  (02/14/2022)   Overall Financial Resource Strain (CARDIA)    Difficulty of Paying Living Expenses: Not hard at all  Food Insecurity: No Food Insecurity (07/03/2022)   Hunger Vital Sign    Worried About Running Out of Food in the Last Year: Never true    Ran Out of Food in the Last Year: Never true  Transportation Needs: No Transportation Needs (07/03/2022)   PRAPARE - Administrator, Civil Service (Medical): No    Lack of Transportation (Non-Medical): No  Physical Activity: Inactive (02/14/2022)   Exercise Vital Sign    Days of Exercise per Week: 0 days    Minutes of Exercise per Session: 0 min  Stress: No Stress Concern Present (02/14/2022)   [employer] of Occupational Health - Occupational Stress Questionnaire    Feeling of Stress : Not at all  Social Connections: Moderately Integrated (02/14/2022)   Social Connection and Isolation Panel [NHANES]    Frequency of Communication with Friends and Family: More than three times a week    Frequency of Social Gatherings  with Friends and Family: Never    Attends Religious Services: More than 4 times per year    Active Member of Clubs or Organizations: No    Attends Banker  Meetings: Never    Marital Status: Married  Catering manager Violence: Not At Risk (02/14/2022)   Humiliation, Afraid, Rape, and Kick questionnaire    Fear of Current or Ex-Partner: No    Emotionally Abused: No    Physically Abused: No    Sexually Abused: No   Social History   Tobacco Use  Smoking Status Never  Smokeless Tobacco Never   Social History   Substance and Sexual Activity  Alcohol Use No    Family History:  Family History  Problem Relation Age of Onset   Cancer Mother    Breast cancer Mother    Stroke Father    Cancer Sister    Cancer Maternal Uncle    Stroke Maternal Uncle     Past medical history, surgical history, medications, allergies, family history and social history reviewed with patient today and changes made to appropriate areas of the chart.   ROS All other ROS negative except what is listed above and in the HPI.      Objective:    BP 136/68 (BP Location: Left Arm, Patient Position: Sitting, Cuff Size: Large)   Pulse 70   Temp 98.4 F (36.9 C) (Oral)   Ht 6\' 3"  (1.905 m)   Wt (!) 332 lb 3.2 oz (150.7 kg)   SpO2 98%   BMI 41.52 kg/m   Wt Readings from Last 3 Encounters:  11/27/22 (!) 332 lb 3.2 oz (150.7 kg)  08/25/22 (!) 325 lb (147.4 kg)  08/07/22 (!) 325 lb (147.4 kg)     Physical Exam Vitals and nursing note reviewed.  Constitutional:      General: He is awake. He is not in acute distress.    Appearance: He is well-developed and well-groomed. He is obese. He is not ill-appearing or toxic-appearing.  HENT:     Head: Normocephalic and atraumatic.     Right Ear: Hearing, tympanic membrane, ear canal and external ear normal. No drainage.     Left Ear: Hearing, tympanic membrane, ear canal and external ear normal. No drainage.     Nose: Nose normal.     Mouth/Throat:     Pharynx: Uvula midline.  Eyes:     General: Lids are normal.        Right eye: No discharge.        Left eye: No discharge.     Extraocular Movements:  Extraocular movements intact.     Conjunctiva/sclera: Conjunctivae normal.     Pupils: Pupils are equal, round, and reactive to light.     Visual Fields: Right eye visual fields normal and left eye visual fields normal.  Neck:     Thyroid: No thyromegaly.     Vascular: No carotid bruit or JVD.     Trachea: Trachea normal.  Cardiovascular:     Rate and Rhythm: Normal rate and regular rhythm.     Heart sounds: Normal heart sounds, S1 normal and S2 normal. No murmur heard.    No gallop.  Pulmonary:     Effort: Pulmonary effort is normal. No accessory muscle usage or respiratory distress.     Breath sounds: Normal breath sounds.  Abdominal:     General: Bowel sounds are normal.     Palpations: Abdomen is soft. There is no hepatomegaly or splenomegaly.  Tenderness: There is no abdominal tenderness.  Musculoskeletal:        General: Normal range of motion.     Cervical back: Normal range of motion and neck supple.     Right lower leg: No edema.     Left lower leg: No edema.  Lymphadenopathy:     Head:     Right side of head: No submental, submandibular, tonsillar, preauricular or posterior auricular adenopathy.     Left side of head: No submental, submandibular, tonsillar, preauricular or posterior auricular adenopathy.     Cervical: No cervical adenopathy.  Skin:    General: Skin is warm and dry.     Capillary Refill: Capillary refill takes less than 2 seconds.     Findings: No rash.  Neurological:     Mental Status: He is alert and oriented to person, place, and time.     Gait: Gait is intact.     Deep Tendon Reflexes: Reflexes are normal and symmetric.     Reflex Scores:      Brachioradialis reflexes are 2+ on the right side and 2+ on the left side.      Patellar reflexes are 2+ on the right side and 2+ on the left side. Psychiatric:        Attention and Perception: Attention normal.        Mood and Affect: Mood normal.        Speech: Speech normal.        Behavior:  Behavior normal. Behavior is cooperative.        Thought Content: Thought content normal.        Cognition and Memory: Cognition normal.    Diabetic Foot Exam - Simple   Simple Foot Form Visual Inspection See comments: Yes Sensation Testing Intact to touch and monofilament testing bilaterally: Yes Pulse Check Posterior Tibialis and Dorsalis pulse intact bilaterally: Yes Comments Dry feet bilaterally.  Toenails well trimmed with no fungal disease.         02/14/2022    9:10 AM 02/13/2021   10:39 AM 10/23/2018    9:51 AM  6CIT Screen  What Year? 0 points 0 points 0 points  What month? 0 points 0 points 0 points  What time? 0 points 0 points 0 points  Count back from 20  0 points 0 points  Months in reverse 0 points 0 points 0 points  Repeat phrase 0 points 0 points 0 points  Total Score  0 points 0 points   Results for orders placed or performed in visit on 11/27/22  Hemoglobin A1c  Result Value Ref Range   Hemoglobin A1C 7.4       Assessment & Plan:   Problem List Items Addressed This Visit       Cardiovascular and Mediastinum   Aortic atherosclerosis (HCC)    Chronic.  Noted on 09/16/19 imaging.  Continue daily statin and ASA for prevention + focus on healthy diet and modest weight loss.      Hypertension associated with diabetes (HCC)    Chronic, stable.  BP at goal on recheck today and at goal on home checks..  Recommend he continue to check BP at home twice daily and document for provider visits. Continue current medication regimen at this time, is on max dose multiple medications.  May need to adjust in future and change HCTZ if return of gout flares or decline in kidney function.  Labs: CBC, CMP, TSH, urine ALB.  Urine ALB 80 July 2024,  maintain ARB.  Return in 6 months.      Relevant Orders   Microalbumin, Urine Waived   TSH     Respiratory   Sleep apnea    Chronic, ongoing.  Continues to use CPAP faithfully every night and denies any issues with this.           Digestive   Gastroesophageal reflux disease without esophagitis    No longer taking PPI, stable at this time.  Recommend continue diet focus at home.      Relevant Orders   Magnesium     Endocrine   CKD stage 3 due to type 2 diabetes mellitus (HCC)    Has been stable for 2 years, will recheck today and is remains stable with discontinue this diagnosis.  Urine ALB 80 July 2024, maintain ARB on board.      Relevant Orders   Microalbumin, Urine Waived   CBC with Differential/Platelet   Comprehensive metabolic panel   Hyperlipidemia associated with type 2 diabetes mellitus (HCC)    Chronic, ongoing.  Continue current medication regimen and adjust as needed.  Lipid panel today and CMP.       Relevant Orders   Comprehensive metabolic panel   Lipid Panel w/o Chol/HDL Ratio   Type 2 diabetes mellitus with both eyes affected by mild nonproliferative retinopathy without macular edema, with long-term current use of insulin (HCC)    Refer to diabetes with proteinuria plan.  Continue visits with ophthalmology.      Relevant Orders   Microalbumin, Urine Waived   Type 2 diabetes mellitus with proteinuria (HCC) - Primary    Chronic, ongoing, followed by endocrinology with insulin pump in place.  Continue current collaboration and pump settings per endo -- recent note reviewed, will defer pump changes to them.  A1c 7.4% with them -- urine ALB 80 July 2024, continue Losartan for kidney protection.  Recommend focus on diet and modest weight loss.  Does endorse poor diet on occasion.  Recommend he continue to monitor BS closely at home and if any consistent <70 immediately alert endo for pump changes.  Return in 6 months. - Vaccinations up to date - ARB and statin on board - Foot and eye exam up to date.      Relevant Orders   Microalbumin, Urine Waived     Other   BMI 40.0-44.9, adult (HCC)    Recommend focus on modest weight loss with diet changes and regular exercise regimen.         Chronic pain syndrome    Chronic, ongoing to cervical spine.  Continue collaboration with pain management and neurosurgery on as needed basis, notes reviewed.  Gabapentin refills up to date.      Gout    Chronic, stable.  Continue current medication regimen and adjust as needed.  Uric acid up to date.  May need to discontinue HCTZ in future if return of flares presents.      Relevant Orders   Uric acid   History of prostate cancer    Continue to collaborate with urology as needed.  PSA obtained today.      Relevant Orders   PSA   Morbid obesity (HCC)    BMI 41.52 with T2DM, HTN.  Recommended eating smaller high protein, low fat meals more frequently and exercising 30 mins a day 5 times a week with a goal of 10-15lb weight loss in the next 3 months. Patient voiced their understanding and motivation to adhere to these recommendations.  Presence of insulin pump    Continue to collaborate with endocrinology.      Other Visit Diagnoses     Encounter for annual physical exam       Annual physical today with labs and health maintenance reviewed, discussed with patient.       Discussed aspirin prophylaxis for myocardial infarction prevention and decision was made to continue ASA  LABORATORY TESTING:  Health maintenance labs ordered today as discussed above.   The natural history of prostate cancer and ongoing controversy regarding screening and potential treatment outcomes of prostate cancer has been discussed with the patient. The meaning of a false positive PSA and a false negative PSA has been discussed. He indicates understanding of the limitations of this screening test and wishes to proceed with screening PSA testing.   IMMUNIZATIONS:   - Tdap: Tetanus vaccination status reviewed: Td vaccination indicated and given today. - Influenza: Up to date - Pneumovax: Up to date - Prevnar: Up to date - Zostavax vaccine: Needs to obtain at pharmacy  SCREENING: -  Colonoscopy: Up to date  Discussed with patient purpose of the colonoscopy is to detect colon cancer at curable precancerous or early stages   - AAA Screening: Not Applicable, never a smoker -Hearing Test: Not applicable  -Spirometry: Not applicable   PATIENT COUNSELING:    Sexuality: Discussed sexually transmitted diseases, partner selection, use of condoms, avoidance of unintended pregnancy  and contraceptive alternatives.   Advised to avoid cigarette smoking.  I discussed with the patient that most people either abstain from alcohol or drink within safe limits (<=14/week and <=4 drinks/occasion for males, <=7/weeks and <= 3 drinks/occasion for females) and that the risk for alcohol disorders and other health effects rises proportionally with the number of drinks per week and how often a drinker exceeds daily limits.  Discussed cessation/primary prevention of drug use and availability of treatment for abuse.   Diet: Encouraged to adjust caloric intake to maintain  or achieve ideal body weight, to reduce intake of dietary saturated fat and total fat, to limit sodium intake by avoiding high sodium foods and not adding table salt, and to maintain adequate dietary potassium and calcium preferably from fresh fruits, vegetables, and low-fat dairy products.    Stressed the importance of regular exercise  Injury prevention: Discussed safety belts, safety helmets, smoke detector, smoking near bedding or upholstery.   Dental health: Discussed importance of regular tooth brushing, flossing, and dental visits.   Follow up plan: NEXT PREVENTATIVE PHYSICAL DUE IN 1 YEAR. Return in about 6 months (around 05/30/2023) for T2DM, HTN/HLD, GOUT, CHRONIC NECK PAIN.

## 2022-11-27 NOTE — Assessment & Plan Note (Signed)
No longer taking PPI, stable at this time.  Recommend continue diet focus at home.

## 2022-11-27 NOTE — Assessment & Plan Note (Signed)
Chronic, ongoing, followed by endocrinology with insulin pump in place.  Continue current collaboration and pump settings per endo -- recent note reviewed, will defer pump changes to them.  A1c 7.4% with them -- urine ALB 80 July 2024, continue Losartan for kidney protection.  Recommend focus on diet and modest weight loss.  Does endorse poor diet on occasion.  Recommend he continue to monitor BS closely at home and if any consistent <70 immediately alert endo for pump changes.  Return in 6 months. - Vaccinations up to date - ARB and statin on board - Foot and eye exam up to date.

## 2022-11-27 NOTE — Assessment & Plan Note (Addendum)
Refer to diabetes with proteinuria plan.  Continue visits with ophthalmology.

## 2022-11-27 NOTE — Assessment & Plan Note (Signed)
Chronic, stable.  Continue current medication regimen and adjust as needed.  Uric acid up to date.  May need to discontinue HCTZ in future if return of flares presents. ?

## 2022-11-27 NOTE — Assessment & Plan Note (Signed)
Chronic, stable.  BP at goal on recheck today and at goal on home checks..  Recommend he continue to check BP at home twice daily and document for provider visits. Continue current medication regimen at this time, is on max dose multiple medications.  May need to adjust in future and change HCTZ if return of gout flares or decline in kidney function.  Labs: CBC, CMP, TSH, urine ALB.  Urine ALB 80 July 2024, maintain ARB.  Return in 6 months.

## 2022-11-27 NOTE — Assessment & Plan Note (Signed)
Recommend focus on modest weight loss with diet changes and regular exercise regimen.   

## 2022-11-28 ENCOUNTER — Encounter: Payer: Self-pay | Admitting: Nurse Practitioner

## 2022-11-28 ENCOUNTER — Other Ambulatory Visit: Payer: Self-pay | Admitting: Nurse Practitioner

## 2022-11-28 ENCOUNTER — Other Ambulatory Visit: Payer: Medicare HMO

## 2022-11-28 DIAGNOSIS — R7989 Other specified abnormal findings of blood chemistry: Secondary | ICD-10-CM

## 2022-11-28 LAB — COMPREHENSIVE METABOLIC PANEL
ALT: 9 IU/L (ref 0–44)
AST: 15 IU/L (ref 0–40)
Albumin: 4.2 g/dL (ref 3.9–4.9)
Alkaline Phosphatase: 230 IU/L — ABNORMAL HIGH (ref 44–121)
BUN/Creatinine Ratio: 20 (ref 10–24)
BUN: 21 mg/dL (ref 8–27)
Bilirubin Total: 0.3 mg/dL (ref 0.0–1.2)
CO2: 25 mmol/L (ref 20–29)
Calcium: 9.1 mg/dL (ref 8.6–10.2)
Chloride: 102 mmol/L (ref 96–106)
Creatinine, Ser: 1.05 mg/dL (ref 0.76–1.27)
Globulin, Total: 2.8 g/dL (ref 1.5–4.5)
Glucose: 186 mg/dL — ABNORMAL HIGH (ref 70–99)
Potassium: 3.8 mmol/L (ref 3.5–5.2)
Sodium: 143 mmol/L (ref 134–144)
Total Protein: 7 g/dL (ref 6.0–8.5)
eGFR: 76 mL/min/{1.73_m2} (ref >59)

## 2022-11-28 LAB — LIPID PANEL W/O CHOL/HDL RATIO
Cholesterol, Total: 95 mg/dL — ABNORMAL LOW (ref 100–199)
HDL: 35 mg/dL — ABNORMAL LOW (ref >39)
LDL Chol Calc (NIH): 41 mg/dL (ref 0–99)
Triglycerides: 102 mg/dL (ref 0–149)
VLDL Cholesterol Cal: 19 mg/dL (ref 5–40)

## 2022-11-28 LAB — CBC WITH DIFFERENTIAL/PLATELET
Basophils Absolute: 0 10*3/uL (ref 0.0–0.2)
Basos: 0 %
EOS (ABSOLUTE): 0.2 10*3/uL (ref 0.0–0.4)
Eos: 2 %
Hematocrit: 41.7 % (ref 37.5–51.0)
Hemoglobin: 13.5 g/dL (ref 13.0–17.7)
Immature Grans (Abs): 0 10*3/uL (ref 0.0–0.1)
Immature Granulocytes: 0 %
Lymphocytes Absolute: 1.8 10*3/uL (ref 0.7–3.1)
Lymphs: 24 %
MCH: 27.6 pg (ref 26.6–33.0)
MCHC: 32.4 g/dL (ref 31.5–35.7)
MCV: 85 fL (ref 79–97)
Monocytes Absolute: 0.6 10*3/uL (ref 0.1–0.9)
Monocytes: 8 %
Neutrophils Absolute: 4.8 10*3/uL (ref 1.4–7.0)
Neutrophils: 66 %
Platelets: 238 10*3/uL (ref 150–450)
RBC: 4.9 x10E6/uL (ref 4.14–5.80)
RDW: 15.1 % (ref 11.6–15.4)
WBC: 7.3 10*3/uL (ref 3.4–10.8)

## 2022-11-28 LAB — PSA: Prostate Specific Ag, Serum: 0.1 ng/mL (ref 0.0–4.0)

## 2022-11-28 LAB — URIC ACID: Uric Acid: 4.6 mg/dL (ref 3.8–8.4)

## 2022-11-28 LAB — MAGNESIUM: Magnesium: 2.1 mg/dL (ref 1.6–2.3)

## 2022-11-28 LAB — TSH: TSH: 4.61 u[IU]/mL — ABNORMAL HIGH (ref 0.450–4.500)

## 2022-11-28 NOTE — Progress Notes (Signed)
Contacted via MyChart -- needs lab only visit in 4 weeks please   Good morning Aaron [name], your labs have returned: - Kidney function, creatinine and eGFR, remains normal, as is liver function, AST and ALT.  - Alkaline phosphatase has trended up a little more, remind me, do you still have a gall bladder?  We will continue to monitor this closely. - Cholesterol levels are fantastic. - CBC, uric acid, magnesium and PSA all normal:) - Thyroid level, TSH, is mildly elevated.  I would like you to come in for lab only visit in 4 weeks to recheck this.  It means thyroid may be a little sluggish/slow and if levels remain elevated we may need to start medication for this.  Any questions? Keep being amazing!!  Thank you for allowing me to participate in your care.  I appreciate you. Kindest regards, Shemicka Cohrs

## 2022-11-29 NOTE — Telephone Encounter (Signed)
Called and scheduled patient for appointment on 12/27/2022 @ 9:00 am.

## 2022-12-01 ENCOUNTER — Encounter: Payer: Self-pay | Admitting: Urology

## 2022-12-27 ENCOUNTER — Other Ambulatory Visit: Payer: Medicare HMO

## 2022-12-27 DIAGNOSIS — R7989 Other specified abnormal findings of blood chemistry: Secondary | ICD-10-CM | POA: Diagnosis not present

## 2022-12-28 DIAGNOSIS — E11649 Type 2 diabetes mellitus with hypoglycemia without coma: Secondary | ICD-10-CM | POA: Diagnosis not present

## 2022-12-28 NOTE — Progress Notes (Signed)
Contacted via MyChart   Good morning Fox, your thyroid levels are all nice and normal this check.  No changes needed:)

## 2023-01-16 DIAGNOSIS — Z794 Long term (current) use of insulin: Secondary | ICD-10-CM | POA: Diagnosis not present

## 2023-01-16 DIAGNOSIS — E113293 Type 2 diabetes mellitus with mild nonproliferative diabetic retinopathy without macular edema, bilateral: Secondary | ICD-10-CM | POA: Diagnosis not present

## 2023-01-16 DIAGNOSIS — Z9641 Presence of insulin pump (external) (internal): Secondary | ICD-10-CM | POA: Diagnosis not present

## 2023-01-26 DIAGNOSIS — E119 Type 2 diabetes mellitus without complications: Secondary | ICD-10-CM | POA: Diagnosis not present

## 2023-01-31 ENCOUNTER — Ambulatory Visit: Payer: Self-pay | Admitting: *Deleted

## 2023-01-31 NOTE — Telephone Encounter (Signed)
Called patient to schedule an appointment he stated that he will wait until his appt on Monday.

## 2023-01-31 NOTE — Telephone Encounter (Signed)
FYI to provider

## 2023-01-31 NOTE — Telephone Encounter (Signed)
  Chief Complaint: blood in urine Symptoms: back pain, pinkish colored urine , burning sensation every times urinates.  Frequency: 2 days  Pertinent Negatives: Patient denies fever no report of abdominal pain  Disposition: [] ED /[] Urgent Care (no appt availability in office) / [x] Appointment(In office/virtual)/ []  [facility]/ [] Home Care/ [] Refused Recommended Disposition /[] [facility]/ []  Follow-up with PCP Additional Notes:   Offered appt for today / tomorrow. Patient declined only wants to see PCP and scheduled appt for 02/05/23. Please advise if patient needs to be seen earlier.     Reason for Disposition  Side (flank) or back pain present  Answer Assessment - Initial Assessment Questions 1. COLOR of URINE: "Describe the color of the urine."  (e.g., tea-colored, pink, red, bloody) "Do you have blood clots in your urine?" (e.g., none, pea, grape, small coin)     Pinkish red no clots 2. ONSET: "When did the bleeding start?"      2 days ago 3. EPISODES: "How many times has there been blood in the urine?" or "How many times today?"     Na  4. PAIN with URINATION: "Is there any pain with passing your urine?" If Yes, ask: "How bad is the pain?"  (Scale 1-10; or mild, moderate, severe)    - MILD: Complains slightly about urination hurting.    - MODERATE: Interferes with normal activities.      - SEVERE: Excruciating, unwilling or unable to urinate because of the pain.      Burning sensation every time urinates 5. FEVER: "Do you have a fever?" If Yes, ask: "What is your temperature, how was it measured, and when did it start?"     Na  6. ASSOCIATED SYMPTOMS: "Are you passing urine more frequently than usual?"     Na  7. OTHER SYMPTOMS: "Do you have any other symptoms?" (e.g., back/flank pain, abdomen pain, vomiting)     Back pain  8. PREGNANCY: "Is there any chance you are pregnant?" "When was your last menstrual period?"     na  Protocols used: Urine - Blood  In[facility]

## 2023-01-31 NOTE — Telephone Encounter (Signed)
Please reach out to patient to be seen this week per Jolene.

## 2023-02-03 NOTE — Patient Instructions (Signed)
Be Involved in Caring For Your Health:  Taking Medications When medications are taken as directed, they can greatly improve your health. But if they are not taken as prescribed, they may not work. In some cases, not taking them correctly can be harmful. To help ensure your treatment remains effective and safe, understand your medications and how to take them. Bring your medications to each visit for review by your provider.  Your lab results, notes, and after visit summary will be available on My Chart. We strongly encourage you to use this feature. If lab results are abnormal the clinic will contact you with the appropriate steps. If the clinic does not contact you assume the results are satisfactory. You can always view your results on My Chart. If you have questions regarding your health or results, please contact the clinic during office hours. You can also ask questions on My Chart.  We at [facility] are grateful that you chose [place] to provide your care. We strive to provide evidence-based and compassionate care and are always looking for feedback. If you get a survey from the clinic please complete this so we can hear your opinions.  Hematuria, Adult Hematuria is blood in the urine. Blood may be visible in the urine, or it may be identified with a test. This condition can be caused by infections of the bladder, urethra, kidney, or prostate. Other possible causes include: Kidney stones. Cancer of the urinary tract. Too much calcium in the urine. Conditions that are passed from parent to child (inherited conditions). Exercise that requires a lot of energy. Infections can usually be treated with medicine, and a kidney stone usually will pass through your urine. If neither of these is the cause of your hematuria, more tests may be needed to identify the cause of your symptoms. It is very important to tell your health care provider about any blood in your urine, even if it is painless  or the blood stops without treatment. Blood in the urine, when it happens and then stops and then happens again, can be a symptom of a very serious condition, including cancer. There is no pain in the initial stages of many urinary cancers. Follow these instructions at home: Medicines Take over-the-counter and prescription medicines only as told by your health care provider. If you were prescribed an antibiotic medicine, take it as told by your health care provider. Do not stop taking the antibiotic even if you start to feel better. Eating and drinking Drink enough fluid to keep your urine pale yellow. It is recommended that you drink 3-4 quarts (2.8-3.8 L) a day. If you have been diagnosed with an infection, drinking cranberry juice in addition to large amounts of water is recommended. Avoid caffeine, tea, and carbonated beverages. These tend to irritate the bladder. Avoid alcohol because it may irritate the prostate (in males). General instructions If you have been diagnosed with a kidney stone, follow your health care provider's instructions about straining your urine to catch the stone. Empty your bladder often. Avoid holding urine for long periods of time. If you are male: After a bowel movement, wipe from front to back and use each piece of toilet paper only once. Empty your bladder before and after sex. Pay attention to any changes in your symptoms. Tell your health care provider about any changes or any new symptoms. It is up to you to get the results of any tests. Ask your health care provider, or the department that is doing  the test, when your results will be ready. Keep all follow-up visits. This is important. Contact a health care provider if: You develop back pain. You have a fever or chills. You have nausea or vomiting. Your symptoms do not improve after 3 days. Your symptoms get worse. Get help right away if: You develop severe vomiting and are unable to take medicine  without vomiting. You develop severe pain in your back or abdomen even though you are taking medicine. You pass a large amount of blood in your urine. You pass blood clots in your urine. You feel very weak or like you might faint. You faint. Summary Hematuria is blood in the urine. It has many possible causes. It is very important that you tell your health care provider about any blood in your urine, even if it is painless or the blood stops without treatment. Take over-the-counter and prescription medicines only as told by your health care provider. Drink enough fluid to keep your urine pale yellow. This information is not intended to replace advice given to you by your health care provider. Make sure you discuss any questions you have with your health care provider. Document Revised: 01/07/2020 Document Reviewed: 01/07/2020 Elsevier Patient Education  2024 [employer].

## 2023-02-04 ENCOUNTER — Emergency Department: Payer: Medicare HMO

## 2023-02-04 ENCOUNTER — Emergency Department
Admission: EM | Admit: 2023-02-04 | Discharge: 2023-02-04 | Disposition: A | Discharge status: Home / Self Care | Payer: Medicare HMO | Attending: Emergency Medicine | Admitting: Emergency Medicine

## 2023-02-04 ENCOUNTER — Other Ambulatory Visit: Payer: Self-pay

## 2023-02-04 DIAGNOSIS — N3000 Acute cystitis without hematuria: Secondary | ICD-10-CM | POA: Diagnosis not present

## 2023-02-04 DIAGNOSIS — J181 Lobar pneumonia, unspecified organism: Secondary | ICD-10-CM | POA: Insufficient documentation

## 2023-02-04 DIAGNOSIS — K5730 Diverticulosis of large intestine without perforation or abscess without bleeding: Secondary | ICD-10-CM | POA: Diagnosis not present

## 2023-02-04 DIAGNOSIS — I10 Essential (primary) hypertension: Secondary | ICD-10-CM | POA: Diagnosis not present

## 2023-02-04 DIAGNOSIS — D72829 Elevated white blood cell count, unspecified: Secondary | ICD-10-CM | POA: Diagnosis not present

## 2023-02-04 DIAGNOSIS — R911 Solitary pulmonary nodule: Secondary | ICD-10-CM | POA: Diagnosis not present

## 2023-02-04 DIAGNOSIS — E041 Nontoxic single thyroid nodule: Secondary | ICD-10-CM | POA: Diagnosis not present

## 2023-02-04 DIAGNOSIS — R918 Other nonspecific abnormal finding of lung field: Secondary | ICD-10-CM | POA: Diagnosis not present

## 2023-02-04 DIAGNOSIS — K8020 Calculus of gallbladder without cholecystitis without obstruction: Secondary | ICD-10-CM | POA: Diagnosis not present

## 2023-02-04 DIAGNOSIS — R079 Chest pain, unspecified: Secondary | ICD-10-CM | POA: Diagnosis not present

## 2023-02-04 DIAGNOSIS — J168 Pneumonia due to other specified infectious organisms: Secondary | ICD-10-CM | POA: Diagnosis not present

## 2023-02-04 DIAGNOSIS — I517 Cardiomegaly: Secondary | ICD-10-CM | POA: Diagnosis not present

## 2023-02-04 DIAGNOSIS — N200 Calculus of kidney: Secondary | ICD-10-CM | POA: Diagnosis not present

## 2023-02-04 DIAGNOSIS — J189 Pneumonia, unspecified organism: Secondary | ICD-10-CM

## 2023-02-04 DIAGNOSIS — R0789 Other chest pain: Secondary | ICD-10-CM | POA: Diagnosis not present

## 2023-02-04 LAB — CBC
HCT: 40.5 % (ref 39.0–52.0)
Hemoglobin: 13.3 g/dL (ref 13.0–17.0)
MCH: 27.1 pg (ref 26.0–34.0)
MCHC: 32.8 g/dL (ref 30.0–36.0)
MCV: 82.7 fL (ref 80.0–100.0)
Platelets: 285 10*3/uL (ref 150–400)
RBC: 4.9 MIL/uL (ref 4.22–5.81)
RDW: 15.5 % (ref 11.5–15.5)
WBC: 14.5 10*3/uL — ABNORMAL HIGH (ref 4.0–10.5)
nRBC: 0 % (ref 0.0–0.2)

## 2023-02-04 LAB — LIPASE, BLOOD: Lipase: 20 U/L (ref 11–51)

## 2023-02-04 LAB — BASIC METABOLIC PANEL
Anion gap: 13 (ref 5–15)
BUN: 14 mg/dL (ref 8–23)
CO2: 25 mmol/L (ref 22–32)
Calcium: 8.9 mg/dL (ref 8.9–10.3)
Chloride: 100 mmol/L (ref 98–111)
Creatinine, Ser: 1.06 mg/dL (ref 0.61–1.24)
GFR, Estimated: >60 mL/min (ref >60)
Glucose, Bld: 133 mg/dL — ABNORMAL HIGH (ref 70–99)
Potassium: 3.8 mmol/L (ref 3.5–5.1)
Sodium: 138 mmol/L (ref 135–145)

## 2023-02-04 LAB — URINALYSIS, ROUTINE W REFLEX MICROSCOPIC
Bacteria, UA: NONE SEEN
Bilirubin Urine: NEGATIVE
Glucose, UA: NEGATIVE mg/dL
Ketones, ur: NEGATIVE mg/dL
Leukocytes,Ua: NEGATIVE
Nitrite: NEGATIVE
Protein, ur: NEGATIVE mg/dL
Specific Gravity, Urine: 1.01 (ref 1.005–1.030)
Squamous Epithelial / HPF: 0 /HPF (ref 0–5)
pH: 7 (ref 5.0–8.0)

## 2023-02-04 LAB — HEPATIC FUNCTION PANEL
ALT: 14 U/L (ref 0–44)
AST: 16 U/L (ref 15–41)
Albumin: 3.9 g/dL (ref 3.5–5.0)
Alkaline Phosphatase: 160 U/L — ABNORMAL HIGH (ref 38–126)
Bilirubin, Direct: 0.1 mg/dL (ref 0.0–0.2)
Indirect Bilirubin: 0.8 mg/dL (ref 0.3–0.9)
Total Bilirubin: 0.9 mg/dL (ref 0.3–1.2)
Total Protein: 7.6 g/dL (ref 6.5–8.1)

## 2023-02-04 LAB — TROPONIN I (HIGH SENSITIVITY)
Troponin I (High Sensitivity): 6 ng/L (ref <18)
Troponin I (High Sensitivity): 5 ng/L (ref <18)

## 2023-02-04 MED ORDER — ONDANSETRON 4 MG PO TBDP: 4.0000 mg | ORAL_TABLET | Freq: Three times a day (TID) | ORAL | 0 refills | Status: DC | PRN

## 2023-02-04 MED ORDER — IOHEXOL 300 MG/ML  SOLN
100.0000 mL | Freq: Once | INTRAMUSCULAR | Status: AC | PRN
  Administered 2023-02-04: 100 mL via INTRAVENOUS

## 2023-02-04 MED ORDER — CEPHALEXIN 500 MG PO CAPS: 500.0000 mg | ORAL_CAPSULE | Freq: Two times a day (BID) | ORAL | 0 refills | Status: AC

## 2023-02-04 MED ORDER — SODIUM CHLORIDE 0.9 % IV SOLN
2.0000 g | Freq: Once | INTRAVENOUS | Status: AC
  Administered 2023-02-04: 2 g via INTRAVENOUS
  Filled 2023-02-04: qty 20

## 2023-02-04 MED ORDER — ONDANSETRON HCL 4 MG/2ML IJ SOLN
4.0000 mg | Freq: Once | INTRAMUSCULAR | Status: AC
  Administered 2023-02-04: 4 mg via INTRAVENOUS
  Filled 2023-02-04: qty 2

## 2023-02-04 MED ORDER — ALUM & MAG HYDROXIDE-SIMETH 200-200-20 MG/5ML PO SUSP
30.0000 mL | Freq: Once | ORAL | Status: AC
  Administered 2023-02-04: 30 mL via ORAL
  Filled 2023-02-04: qty 30

## 2023-02-04 MED ORDER — PANTOPRAZOLE SODIUM 40 MG IV SOLR
40.0000 mg | Freq: Once | INTRAVENOUS | Status: AC
  Administered 2023-02-04: 40 mg via INTRAVENOUS
  Filled 2023-02-04: qty 10

## 2023-02-04 MED ORDER — DOXYCYCLINE MONOHYDRATE 100 MG PO TABS: 100.0000 mg | ORAL_TABLET | Freq: Two times a day (BID) | ORAL | 0 refills | Status: AC

## 2023-02-04 NOTE — ED Notes (Signed)
Discharge instructions reviewed with patient. Patient questions answered and opportunity for education reviewed. Patient voices understanding of discharge instructions with no further questions. Patient ambulatory with steady gait to lobby.  

## 2023-02-04 NOTE — ED Notes (Signed)
See triage note  Presents with "just feeling lousy"  having some n/v ,abd discomfort and some discomfort in chest    Denies any fever

## 2023-02-04 NOTE — ED Provider Notes (Signed)
[facility] Provider Note    Event Date/Time   First MD Initiated Contact with Patient 02/04/23 1107     (approximate)   History   Chest Pain   HPI  Aaron [name] is a 72 y.o. male presents to the emergency department with right-sided chest and abdominal pain.  States that that is started for the past 1 day and has been worsening since that time.  Endorses nausea but no episodes of vomiting.  Denies any fever or chills.  He says he just does not feel well.  Does state that he has symptoms of urinary tract infection with burning with urination.  Denies any falls or trauma.  Denies any history of tobacco use.  No recent antibiotic use.     Physical Exam   Triage Vital Signs: ED Triage Vitals  Encounter Vitals Group     BP 02/04/23 1033 (!) 167/79     Systolic BP Percentile --      Diastolic BP Percentile --      Pulse Rate 02/04/23 1033 64     Resp 02/04/23 1033 20     Temp 02/04/23 1033 97.8 F (36.6 C)     Temp Source 02/04/23 1033 Oral     SpO2 02/04/23 1033 100 %     Weight 02/04/23 1034 (!) 330 lb (149.7 kg)     Height 02/04/23 1034 6\' 3"  (1.905 m)     Head Circumference --      Peak Flow --      Pain Score 02/04/23 1034 6     Pain Loc --      Pain Education --      Exclude from Growth Chart --     Most recent vital signs: Vitals:   02/04/23 1625 02/04/23 1639  BP: (!) 154/84 (!) 148/81  Pulse: 71 80  Resp: 16 19  Temp: 98.1 F (36.7 C) 98.3 F (36.8 C)  SpO2: 97% 97%    Physical Exam Constitutional:      Appearance: He is well-developed.  HENT:     Head: Atraumatic.  Eyes:     Conjunctiva/sclera: Conjunctivae normal.  Cardiovascular:     Rate and Rhythm: Regular rhythm.  Pulmonary:     Effort: No respiratory distress.  Abdominal:     Tenderness: There is abdominal tenderness.  Musculoskeletal:     Cervical back: Normal range of motion.     Right lower leg: No edema.     Left lower leg: No edema.  Skin:     General: Skin is warm.     Capillary Refill: Capillary refill takes less than 2 seconds.  Neurological:     Mental Status: He is alert. Mental status is at baseline.     IMPRESSION / MDM / ASSESSMENT AND PLAN / ED COURSE  I reviewed the triage vital signs and the nursing notes.  Differential diagnosis including ACS, pneumonia, gastritis/PUD, small bowel obstruction, symptomatic cholelithiasis, acute cholecystitis  EKG  I, Corena Herter, the attending physician, personally viewed and interpreted this ECG.   Rate: Normal  Rhythm: Normal sinus  Axis: Normal  Intervals: Normal  ST&T Change: Artifact.  No significant ST elevation or depression.  No tachycardic or bradycardic dysrhythmias while on cardiac telemetry.  RADIOLOGY I independently reviewed imaging, my interpretation of imaging: CT abdomen and pelvis with findings concerning for possible pulmonary nodule.  On my evaluation I did not see an obvious pulmonary nodule or signs of a pneumonia.  CT  scan of the chest was obtained.  Noted for the pulmonary nodule that they felt was more likely an infectious process.  Did recommend close follow-up for repeat CT imaging.  Also had findings concerning for urinary tract infection.  LABS (all labs ordered are listed, but only abnormal results are displayed) Labs interpreted as -    Labs Reviewed  BASIC METABOLIC PANEL - Abnormal; Notable for the following components:      Result Value   Glucose, Bld 133 (*)    All other components within normal limits  CBC - Abnormal; Notable for the following components:   WBC 14.5 (*)    All other components within normal limits  URINALYSIS, ROUTINE W REFLEX MICROSCOPIC - Abnormal; Notable for the following components:   Color, Urine STRAW (*)    APPearance CLEAR (*)    Hgb urine dipstick SMALL (*)    All other components within normal limits  HEPATIC FUNCTION PANEL - Abnormal; Notable for the following components:   Alkaline Phosphatase 160  (*)    All other components within normal limits  LIPASE, BLOOD  TROPONIN I (HIGH SENSITIVITY)  TROPONIN I (HIGH SENSITIVITY)     MDM  Patient with findings of cholelithiasis but no findings of acute cholecystitis.  Does have leukocytosis and findings of questionable urinary tract infection.  Given his CT scan findings and his symptoms we will go ahead and treat the patient for urinary tract infection.  Patient given multiple doses of IV pain medication.  Given IV PPI.  Started on IV Rocephin to cover for urinary tract infection and possible community-acquired pneumonia.  Discussed incidental findings of the CT scan with the patient and the need for close follow-up with his primary care physician and likely repeat imaging.  Started on antibiotics to cover for community-acquired pneumonia and a urinary tract infection.  Discussed return precautions if he did not have improvement of his symptoms or any worsening symptoms.     PROCEDURES:  Critical Care performed: No  Procedures  Patient's presentation is most consistent with acute presentation with potential threat to life or bodily function.   MEDICATIONS ORDERED IN ED: Medications  ondansetron (ZOFRAN) injection 4 mg (4 mg Intravenous Given 02/04/23 1253)  pantoprazole (PROTONIX) injection 40 mg (40 mg Intravenous Given 02/04/23 1253)  alum & mag hydroxide-simeth (MAALOX/MYLANTA) 200-200-20 MG/5ML suspension 30 mL (30 mLs Oral Given 02/04/23 1254)  iohexol (OMNIPAQUE) 300 MG/ML solution 100 mL (100 mLs Intravenous Contrast Given 02/04/23 1312)  cefTRIAXone (ROCEPHIN) 2 g in sodium chloride 0.9 % 100 mL IVPB (0 g Intravenous Stopped 02/04/23 1633)    FINAL CLINICAL IMPRESSION(S) / ED DIAGNOSES   Final diagnoses:  Pulmonary nodule  Community acquired pneumonia of right lower lobe of lung  Acute cystitis without hematuria     Rx / DC Orders   ED Discharge Orders          Ordered    doxycycline (ADOXA) 100 MG tablet  2 times  daily        02/04/23 1524    cephALEXin (KEFLEX) 500 MG capsule  2 times daily        02/04/23 1524    ondansetron (ZOFRAN-ODT) 4 MG disintegrating tablet  Every 8 hours PRN        02/04/23 1524             Note:  This document was prepared using Dragon voice recognition software and may include unintentional dictation errors.   Corena Herter, MD 02/04/23 [phone]

## 2023-02-04 NOTE — Discharge Instructions (Addendum)
You were seen in the emergency department and had a CT scan of your chest abdomen and pelvis.  You had questionable findings of a pneumonia to your right lung.  You also had questionable findings of a urinary tract infection.  You had findings of a gallstone however this was not causing your pain today.  You were given your first dose of IV antibiotics while in the emergency department and you were started on an antibiotic to go home with.  Take this medication as prescribed.   Doxycycline - This medication can cause acid reflux.  It is important that you take it with food and drink plenty of water.  Do not lie down for 1 hour after taking this medication.  It also causes sun sensitivity so stay out of the sun or wear SPF while on this medication.  You need to follow-up with your primary care physician.  You need to repeat CT scan in 3 months or further workup of the lung nodule found today if it does not improve following treatment of antibiotics.  Return to the emergency department for worsening symptoms.  Thank you for choosing [place] for your health care, it was my pleasure to care for you today!  Corena Herter, MD

## 2023-02-04 NOTE — ED Triage Notes (Signed)
Pt presents to ED with c/o of "feeling lousy". PT states CP since Friday, pt states HX of hypertension. Pt states he may also have a UTI. NAD noted.

## 2023-02-05 ENCOUNTER — Encounter: Payer: Self-pay | Admitting: Nurse Practitioner

## 2023-02-05 ENCOUNTER — Ambulatory Visit (INDEPENDENT_AMBULATORY_CARE_PROVIDER_SITE_OTHER): Payer: Medicare HMO | Admitting: Nurse Practitioner

## 2023-02-05 VITALS — BP 111/65 | HR 75 | Temp 98.9°F | Wt 312.6 lb

## 2023-02-05 DIAGNOSIS — N3001 Acute cystitis with hematuria: Secondary | ICD-10-CM | POA: Diagnosis not present

## 2023-02-05 DIAGNOSIS — Z23 Encounter for immunization: Secondary | ICD-10-CM | POA: Diagnosis not present

## 2023-02-05 DIAGNOSIS — J189 Pneumonia, unspecified organism: Secondary | ICD-10-CM | POA: Insufficient documentation

## 2023-02-05 DIAGNOSIS — E890 Postprocedural hypothyroidism: Secondary | ICD-10-CM | POA: Insufficient documentation

## 2023-02-05 DIAGNOSIS — R3121 Asymptomatic microscopic hematuria: Secondary | ICD-10-CM

## 2023-02-05 DIAGNOSIS — E041 Nontoxic single thyroid nodule: Secondary | ICD-10-CM | POA: Diagnosis not present

## 2023-02-05 DIAGNOSIS — Z8546 Personal history of malignant neoplasm of prostate: Secondary | ICD-10-CM

## 2023-02-05 DIAGNOSIS — N390 Urinary tract infection, site not specified: Secondary | ICD-10-CM | POA: Insufficient documentation

## 2023-02-05 DIAGNOSIS — R911 Solitary pulmonary nodule: Secondary | ICD-10-CM | POA: Insufficient documentation

## 2023-02-05 NOTE — Assessment & Plan Note (Addendum)
Noted on lung CT in ER on 02/04/23 with recommendation to perform ultrasound.  Discussed with patient at length and will order this today.  Determine next steps needed after results return.

## 2023-02-05 NOTE — Assessment & Plan Note (Signed)
Acute, diagnosed in ER on 02/04/23.  Currently taking Keflex and Doxycycline.  Will continue these and plan on repeat CXR in 6 weeks.  Overall exam reassuring today and he reports some improvement in symptoms.

## 2023-02-05 NOTE — Assessment & Plan Note (Signed)
Noted on CT imaging in ER on 02/04/23, right middle lobe nodule 16 x 10 mm.  Recommendation for repeat CT in 3 months be considered.  Discussed with patient.  Will plan on repeat at that time once PNA has improved.

## 2023-02-05 NOTE — Progress Notes (Signed)
BP 111/65 (BP Location: Left Arm, Cuff Size: Large)   Pulse 75   Temp 98.9 F (37.2 C) (Oral)   Wt (!) 312 lb 9.6 oz (141.8 kg)   SpO2 96%   BMI 39.07 kg/m    Subjective:    Patient ID: Aaron [name], male    DOB: Oct 07, 1950, 72 y.o.   MRN: [id number]  HPI: Aaron [name] is a 72 y.o. male  Chief Complaint  Patient presents with   ER Follow Up   Urinary Tract Infection    Patient states he has been having back pain, burning with urination, and light blood in his urine since last Wednesday.    ER FOLLOW UP Seen in ER on 02/04/23.  Presented due to right-sided chest pain and abdominal pain.  Work-up was performed.  UA noted small amount of blood and no bacteria -- he was placed on Keflex.  Labs as below.  CT CHEST noted large right thyroid nodule up to 5.9 cm with recommendation for ultrasound.  Also noted clustered nodularity in the right middle lobe, could represent inflammation + right middle lobe nodule 16 x 10 mm and could consider 3 month follow-up with CT.  CXR noted mild cardiac enlargement.  He was started on Doxycycline for CAP right lower lobe.  Was given dose of Rocephin in ER.    He reports he is feeling a little better, still has some pain when he takes a deep breath to right lower lung area. Is tolerating abx therapy at this time.  Has not noticed any blood in urine. Time since discharge: 1 day Hospital/facility: [facility] Diagnosis: CAP RLL, pulmonary nodule, and acute cystitis Procedures/tests: as above Consultants: none New medications: Keflex and Doxycycline Discharge instructions:  follow-up with PCP Status: stable     Latest Ref Rng & Units 02/04/2023   10:35 AM 11/27/2022    8:54 AM 09/08/2019    2:28 PM  CBC  WBC 4.0 - 10.5 K/uL 14.5  7.3  9.5   Hemoglobin 13.0 - 17.0 g/dL 95.2  84.1  32.4   Hematocrit 39.0 - 52.0 % 40.5  41.7  40.8   Platelets 150 - 400 K/uL 285  238  285        Latest Ref Rng & Units 02/04/2023   10:35 AM 11/27/2022    8:54 AM 08/22/2022     1:11 PM  CMP  Glucose 70 - 99 mg/dL 401  027    BUN 8 - 23 mg/dL 14  21    Creatinine 2.53 - 1.24 mg/dL 6.64  4.03  4.74   Sodium 135 - 145 mmol/L 138  143    Potassium 3.5 - 5.1 mmol/L 3.8  3.8    Chloride 98 - 111 mmol/L 100  102    CO2 22 - 32 mmol/L 25  25    Calcium 8.9 - 10.3 mg/dL 8.9  9.1    Total Protein 6.5 - 8.1 g/dL 7.6  7.0    Total Bilirubin 0.3 - 1.2 mg/dL 0.9  0.3    Alkaline Phos 38 - 126 U/L 160  230    AST 15 - 41 U/L 16  15    ALT 0 - 44 U/L 14  9      Relevant past medical, surgical, family and social history reviewed and updated as indicated. Interim medical history since our last visit reviewed. Allergies and medications reviewed and updated.  Review of Systems  Constitutional:  Negative for activity change,  appetite change, chills, fatigue and fever.  HENT:  Positive for congestion (a little that comes and goes). Negative for ear discharge, ear pain, postnasal drip, rhinorrhea, sinus pressure and sore throat.   Respiratory:  Positive for chest tightness (yesterday, improved today). Negative for cough, shortness of breath and wheezing.   Cardiovascular:  Negative for chest pain, palpitations and leg swelling.  Gastrointestinal: Negative.   Genitourinary:  Negative for dysuria, flank pain, frequency, hematuria and urgency.  Neurological: Negative.   Psychiatric/Behavioral: Negative.      Per HPI unless specifically indicated above     Objective:    BP 111/65 (BP Location: Left Arm, Cuff Size: Large)   Pulse 75   Temp 98.9 F (37.2 C) (Oral)   Wt (!) 312 lb 9.6 oz (141.8 kg)   SpO2 96%   BMI 39.07 kg/m   Wt Readings from Last 3 Encounters:  02/05/23 (!) 312 lb 9.6 oz (141.8 kg)  02/04/23 (!) 330 lb (149.7 kg)  11/27/22 (!) 332 lb 3.2 oz (150.7 kg)    Physical Exam Vitals and nursing note reviewed.  Constitutional:      General: He is awake. He is not in acute distress.    Appearance: He is well-developed and well-groomed. He is obese. He is  not ill-appearing or toxic-appearing.  HENT:     Head: Normocephalic and atraumatic.     Right Ear: Hearing, tympanic membrane, ear canal and external ear normal. No drainage.     Left Ear: Hearing, tympanic membrane, ear canal and external ear normal. No drainage.     Nose: Nose normal.     Right Sinus: No maxillary sinus tenderness or frontal sinus tenderness.     Left Sinus: No maxillary sinus tenderness or frontal sinus tenderness.     Mouth/Throat:     Mouth: Mucous membranes are moist.     Pharynx: Uvula midline. No pharyngeal swelling, oropharyngeal exudate or posterior oropharyngeal erythema.  Eyes:     General: Lids are normal.        Right eye: No discharge.        Left eye: No discharge.     Extraocular Movements: Extraocular movements intact.     Conjunctiva/sclera: Conjunctivae normal.     Pupils: Pupils are equal, round, and reactive to light.     Visual Fields: Right eye visual fields normal and left eye visual fields normal.  Neck:     Thyroid: No thyromegaly.     Vascular: No carotid bruit.  Cardiovascular:     Rate and Rhythm: Normal rate and regular rhythm.     Heart sounds: Normal heart sounds, S1 normal and S2 normal. No murmur heard.    No gallop.  Pulmonary:     Effort: Pulmonary effort is normal. No accessory muscle usage or respiratory distress.     Breath sounds: Normal breath sounds. No decreased breath sounds, wheezing or rhonchi.  Abdominal:     General: Bowel sounds are normal. There is no distension.     Palpations: Abdomen is soft.     Tenderness: There is no abdominal tenderness. There is no right CVA tenderness or left CVA tenderness.  Musculoskeletal:        General: Normal range of motion.     Cervical back: Normal range of motion and neck supple.     Right lower leg: No edema.     Left lower leg: No edema.  Lymphadenopathy:     Head:     Right side of  head: No submental, submandibular, tonsillar, preauricular or posterior auricular  adenopathy.     Left side of head: No submental, submandibular, tonsillar, preauricular or posterior auricular adenopathy.     Cervical: No cervical adenopathy.  Skin:    General: Skin is warm and dry.     Capillary Refill: Capillary refill takes less than 2 seconds.  Neurological:     Mental Status: He is alert and oriented to person, place, and time.     Gait: Gait is intact.     Deep Tendon Reflexes: Reflexes are normal and symmetric.     Reflex Scores:      Brachioradialis reflexes are 2+ on the right side and 2+ on the left side.      Patellar reflexes are 2+ on the right side and 2+ on the left side. Psychiatric:        Attention and Perception: Attention normal.        Mood and Affect: Mood normal.        Speech: Speech normal.        Behavior: Behavior normal. Behavior is cooperative.        Thought Content: Thought content normal.    Results for orders placed or performed during the hospital encounter of 02/04/23  Basic metabolic panel  Result Value Ref Range   Sodium 138 135 - 145 mmol/L   Potassium 3.8 3.5 - 5.1 mmol/L   Chloride 100 98 - 111 mmol/L   CO2 25 22 - 32 mmol/L   Glucose, Bld 133 (H) 70 - 99 mg/dL   BUN 14 8 - 23 mg/dL   Creatinine, Ser 6.01 0.61 - 1.24 mg/dL   Calcium 8.9 8.9 - 09.3 mg/dL   GFR, Estimated >23 >55 mL/min   Anion gap 13 5 - 15  CBC  Result Value Ref Range   WBC 14.5 (H) 4.0 - 10.5 K/uL   RBC 4.90 4.22 - 5.81 MIL/uL   Hemoglobin 13.3 13.0 - 17.0 g/dL   HCT 73.2 20.2 - 54.2 %   MCV 82.7 80.0 - 100.0 fL   MCH 27.1 26.0 - 34.0 pg   MCHC 32.8 30.0 - 36.0 g/dL   RDW 70.6 23.7 - 62.8 %   Platelets 285 150 - 400 K/uL   nRBC 0.0 0.0 - 0.2 %  Urinalysis, Routine w reflex microscopic -Urine, Clean Catch  Result Value Ref Range   Color, Urine STRAW (A) YELLOW   APPearance CLEAR (A) CLEAR   Specific Gravity, Urine 1.010 1.005 - 1.030   pH 7.0 5.0 - 8.0   Glucose, UA NEGATIVE NEGATIVE mg/dL   Hgb urine dipstick SMALL (A) NEGATIVE    Bilirubin Urine NEGATIVE NEGATIVE   Ketones, ur NEGATIVE NEGATIVE mg/dL   Protein, ur NEGATIVE NEGATIVE mg/dL   Nitrite NEGATIVE NEGATIVE   Leukocytes,Ua NEGATIVE NEGATIVE   RBC / HPF 6-10 0 - 5 RBC/hpf   WBC, UA 6-10 0 - 5 WBC/hpf   Bacteria, UA NONE SEEN NONE SEEN   Squamous Epithelial / HPF 0 0 - 5 /HPF  Hepatic function panel  Result Value Ref Range   Total Protein 7.6 6.5 - 8.1 g/dL   Albumin 3.9 3.5 - 5.0 g/dL   AST 16 15 - 41 U/L   ALT 14 0 - 44 U/L   Alkaline Phosphatase 160 (H) 38 - 126 U/L   Total Bilirubin 0.9 0.3 - 1.2 mg/dL   Bilirubin, Direct 0.1 0.0 - 0.2 mg/dL   Indirect Bilirubin 0.8 0.3 -  0.9 mg/dL  Lipase, blood  Result Value Ref Range   Lipase 20 11 - 51 U/L  Troponin I (High Sensitivity)  Result Value Ref Range   Troponin I (High Sensitivity) 6 <18 ng/L  Troponin I (High Sensitivity)  Result Value Ref Range   Troponin I (High Sensitivity) 5 <18 ng/L      Assessment & Plan:   Problem List Items Addressed This Visit       Respiratory   Community acquired pneumonia of right lower lobe of lung - Primary    Acute, diagnosed in ER on 02/04/23.  Currently taking Keflex and Doxycycline.  Will continue these and plan on repeat CXR in 6 weeks.  Overall exam reassuring today and he reports some improvement in symptoms.      Nodule of middle lobe of right lung    Noted on CT imaging in ER on 02/04/23, right middle lobe nodule 16 x 10 mm.  Recommendation for repeat CT in 3 months be considered.  Discussed with patient.  Will plan on repeat at that time once PNA has improved.        Endocrine   Thyroid nodule    Noted on lung CT in ER on 02/04/23 with recommendation to perform ultrasound.  Discussed with patient at length and will order this today.  Determine next steps needed after results return.      Relevant Orders   US THYROID     Genitourinary   UTI (urinary tract infection)    Acute, diagnosed in ER on 02/04/23.  Currently taking Keflex. No culture was  sent from ER.  He reports symptoms are improving.  Will plan on recheck in 4 weeks.      Other Visit Diagnoses     Flu vaccine need       Flu vaccine provided in office today, educated patient.   Relevant Orders   Flu Vaccine Trivalent High Dose (Fluad) (Completed)        Follow up plan: Return in about 4 weeks (around 03/05/2023) for PNA and UTI follow-up.

## 2023-02-05 NOTE — Assessment & Plan Note (Signed)
Acute, diagnosed in ER on 02/04/23.  Currently taking Keflex. No culture was sent from ER.  He reports symptoms are improving.  Will plan on recheck in 4 weeks.

## 2023-02-07 ENCOUNTER — Ambulatory Visit
Admission: RE | Admit: 2023-02-07 | Discharge: 2023-02-07 | Disposition: A | Discharge status: Home / Self Care | Payer: Medicare HMO | Source: Ambulatory Visit | Attending: Nurse Practitioner | Admitting: Nurse Practitioner

## 2023-02-07 DIAGNOSIS — E041 Nontoxic single thyroid nodule: Secondary | ICD-10-CM | POA: Diagnosis not present

## 2023-02-07 DIAGNOSIS — E042 Nontoxic multinodular goiter: Secondary | ICD-10-CM | POA: Diagnosis not present

## 2023-02-12 ENCOUNTER — Telehealth: Payer: Self-pay | Admitting: Nurse Practitioner

## 2023-02-12 DIAGNOSIS — E041 Nontoxic single thyroid nodule: Secondary | ICD-10-CM

## 2023-02-12 NOTE — Telephone Encounter (Signed)
Spoke to [place] on phone and discussed ultrasound results with him, two nodules that would benefit FNA.  He would like to proceed with this.  Will place referral to ENT for next steps.  All questions answered.

## 2023-03-01 ENCOUNTER — Encounter: Payer: Self-pay | Admitting: Nurse Practitioner

## 2023-03-01 ENCOUNTER — Ambulatory Visit (INDEPENDENT_AMBULATORY_CARE_PROVIDER_SITE_OTHER): Payer: Medicare HMO | Admitting: Nurse Practitioner

## 2023-03-01 VITALS — BP 126/66 | HR 65 | Temp 98.2°F | Ht 75.0 in | Wt 326.4 lb

## 2023-03-01 DIAGNOSIS — N3001 Acute cystitis with hematuria: Secondary | ICD-10-CM | POA: Diagnosis not present

## 2023-03-01 DIAGNOSIS — J189 Pneumonia, unspecified organism: Secondary | ICD-10-CM | POA: Diagnosis not present

## 2023-03-01 DIAGNOSIS — Z23 Encounter for immunization: Secondary | ICD-10-CM

## 2023-03-01 DIAGNOSIS — E041 Nontoxic single thyroid nodule: Secondary | ICD-10-CM

## 2023-03-01 NOTE — Patient Instructions (Signed)

## 2023-03-01 NOTE — Assessment & Plan Note (Signed)
Acute, diagnosed in ER on 02/04/23.  Symptoms improved at this time.  Does endorse some constipation on occasion.  We discussed him starting Metamucil every morning and ensuring good water intake.

## 2023-03-01 NOTE — Progress Notes (Signed)
BP 126/66   Pulse 65   Temp 98.2 F (36.8 C) (Oral)   Ht 6\' 3"  (1.905 m)   Wt (!) 326 lb 6.4 oz (148.1 kg)   SpO2 98%   BMI 40.80 kg/m    Subjective:    Patient ID: Aaron [name], male    DOB: May 06, 1951, 72 y.o.   MRN: [id number]  HPI: Aaron [name] is a 72 y.o. male  Chief Complaint  Patient presents with   Pneumonia    4 week f/up   Urinary Tract Infection    4 week f/up   PNEUMONIA Diagnosed on 02/04/23 and treated with Keflex and Doxycycline - reports overall is feeling better with no further symptoms.  Was noted to have thyroid nodule on recent imaging and goes to ENT Tuesday for biopsy.   Recurrent headaches:  Visual changes: no Palpitations: no Dyspnea: no Chest pain: no Lower extremity edema: no Dizzy/lightheaded: no  URINARY SYMPTOMS Diagnosed on 02/04/23 and took Keflex.  Has some underlying constipation on occasion and concerns for this causing UTIs.  Currently constipation has improved, but when urinates he has a back fire from bottom.  Returns to urology in April -- visits once a year.  PSA in July <0.1.  He reports [place] Type 4, but reports he will go 5 days without BM at times. Dysuria: no Urinary frequency:  no more then usual Urgency: no more then usual Small volume voids: no Symptom severity: no Urinary incontinence: no Foul odor: no Hematuria:  only when pushing "square peg" out -- constipation Abdominal pain: no Back pain: no Suprapubic pain/pressure: no Flank pain: no Fever:  yes, no, subjective, and low grade Vomiting: no Status: improving Previous urinary tract infection: yes Sexual activity: No sexually active Treatments attempted: antibiotics and increasing fluids     Relevant past medical, surgical, family and social history reviewed and updated as indicated. Interim medical history since our last visit reviewed. Allergies and medications reviewed and updated.  Review of Systems  Constitutional:  Negative for activity  change, diaphoresis, fatigue and fever.  Respiratory:  Negative for cough, chest tightness, shortness of breath and wheezing.   Cardiovascular:  Negative for chest pain, palpitations and leg swelling.  Gastrointestinal: Negative.   Endocrine: Negative for cold intolerance and heat intolerance.  Neurological: Negative.   Psychiatric/Behavioral: Negative.      Per HPI unless specifically indicated above     Objective:    BP 126/66   Pulse 65   Temp 98.2 F (36.8 C) (Oral)   Ht 6\' 3"  (1.905 m)   Wt (!) 326 lb 6.4 oz (148.1 kg)   SpO2 98%   BMI 40.80 kg/m   Wt Readings from Last 3 Encounters:  03/01/23 (!) 326 lb 6.4 oz (148.1 kg)  02/05/23 (!) 312 lb 9.6 oz (141.8 kg)  02/04/23 (!) 330 lb (149.7 kg)    Physical Exam Vitals and nursing note reviewed.  Constitutional:      General: He is awake. He is not in acute distress.    Appearance: He is well-developed and well-groomed. He is obese. He is not ill-appearing or toxic-appearing.  HENT:     Head: Normocephalic.     Right Ear: Hearing and external ear normal.     Left Ear: Hearing and external ear normal.  Eyes:     General: Lids are normal.     Extraocular Movements: Extraocular movements intact.     Conjunctiva/sclera: Conjunctivae normal.  Neck:  Thyroid: No thyromegaly.     Vascular: No carotid bruit.  Cardiovascular:     Rate and Rhythm: Normal rate and regular rhythm.     Heart sounds: Normal heart sounds. No murmur heard.    No gallop.  Pulmonary:     Effort: No accessory muscle usage or respiratory distress.     Breath sounds: Normal breath sounds. No decreased breath sounds, wheezing or rhonchi.  Abdominal:     General: Bowel sounds are normal. There is no distension.     Palpations: Abdomen is soft.     Tenderness: There is no abdominal tenderness.  Musculoskeletal:     Cervical back: Full passive range of motion without pain.     Right lower leg: No edema.     Left lower leg: No edema.   Lymphadenopathy:     Cervical: No cervical adenopathy.  Skin:    General: Skin is warm.     Capillary Refill: Capillary refill takes less than 2 seconds.  Neurological:     Mental Status: He is alert and oriented to person, place, and time.     Deep Tendon Reflexes: Reflexes are normal and symmetric.     Reflex Scores:      Brachioradialis reflexes are 2+ on the right side and 2+ on the left side.      Patellar reflexes are 2+ on the right side and 2+ on the left side. Psychiatric:        Attention and Perception: Attention normal.        Mood and Affect: Mood normal.        Speech: Speech normal.        Behavior: Behavior normal. Behavior is cooperative.        Thought Content: Thought content normal.    Results for orders placed or performed during the hospital encounter of 02/04/23  Basic metabolic panel  Result Value Ref Range   Sodium 138 135 - 145 mmol/L   Potassium 3.8 3.5 - 5.1 mmol/L   Chloride 100 98 - 111 mmol/L   CO2 25 22 - 32 mmol/L   Glucose, Bld 133 (H) 70 - 99 mg/dL   BUN 14 8 - 23 mg/dL   Creatinine, Ser 4.13 0.61 - 1.24 mg/dL   Calcium 8.9 8.9 - 24.4 mg/dL   GFR, Estimated >01 >02 mL/min   Anion gap 13 5 - 15  CBC  Result Value Ref Range   WBC 14.5 (H) 4.0 - 10.5 K/uL   RBC 4.90 4.22 - 5.81 MIL/uL   Hemoglobin 13.3 13.0 - 17.0 g/dL   HCT 72.5 36.6 - 44.0 %   MCV 82.7 80.0 - 100.0 fL   MCH 27.1 26.0 - 34.0 pg   MCHC 32.8 30.0 - 36.0 g/dL   RDW 34.7 42.5 - 95.6 %   Platelets 285 150 - 400 K/uL   nRBC 0.0 0.0 - 0.2 %  Urinalysis, Routine w reflex microscopic -Urine, Clean Catch  Result Value Ref Range   Color, Urine STRAW (A) YELLOW   APPearance CLEAR (A) CLEAR   Specific Gravity, Urine 1.010 1.005 - 1.030   pH 7.0 5.0 - 8.0   Glucose, UA NEGATIVE NEGATIVE mg/dL   Hgb urine dipstick SMALL (A) NEGATIVE   Bilirubin Urine NEGATIVE NEGATIVE   Ketones, ur NEGATIVE NEGATIVE mg/dL   Protein, ur NEGATIVE NEGATIVE mg/dL   Nitrite NEGATIVE NEGATIVE    Leukocytes,Ua NEGATIVE NEGATIVE   RBC / HPF 6-10 0 - 5 RBC/hpf  WBC, UA 6-10 0 - 5 WBC/hpf   Bacteria, UA NONE SEEN NONE SEEN   Squamous Epithelial / HPF 0 0 - 5 /HPF  Hepatic function panel  Result Value Ref Range   Total Protein 7.6 6.5 - 8.1 g/dL   Albumin 3.9 3.5 - 5.0 g/dL   AST 16 15 - 41 U/L   ALT 14 0 - 44 U/L   Alkaline Phosphatase 160 (H) 38 - 126 U/L   Total Bilirubin 0.9 0.3 - 1.2 mg/dL   Bilirubin, Direct 0.1 0.0 - 0.2 mg/dL   Indirect Bilirubin 0.8 0.3 - 0.9 mg/dL  Lipase, blood  Result Value Ref Range   Lipase 20 11 - 51 U/L  Troponin I (High Sensitivity)  Result Value Ref Range   Troponin I (High Sensitivity) 6 <18 ng/L  Troponin I (High Sensitivity)  Result Value Ref Range   Troponin I (High Sensitivity) 5 <18 ng/L      Assessment & Plan:   Problem List Items Addressed This Visit       Respiratory   Community acquired pneumonia of right lower lobe of lung - Primary    Acute and improved.  Will order repeat CXR he can obtain in upcoming weeks to recheck.  No further symptoms present.      Relevant Orders   DG Chest 2 View     Endocrine   Thyroid nodule    Scheduled to see ENT upcoming for biopsy.  Will review notes once available.        Genitourinary   UTI (urinary tract infection)    Acute, diagnosed in ER on 02/04/23.  Symptoms improved at this time.  Does endorse some constipation on occasion.  We discussed him starting Metamucil every morning and ensuring good water intake.      Other Visit Diagnoses     COVID-19 vaccine administered       Covid vaccine today, educated patient.   Relevant Orders   Pfizer Comirnaty Covid -19 Vaccine 59yrs and older        Follow up plan: Return in about 4 weeks (around 03/29/2023) for Constipation and Thyroid.

## 2023-03-01 NOTE — Assessment & Plan Note (Signed)
Scheduled to see ENT upcoming for biopsy.  Will review notes once available.

## 2023-03-01 NOTE — Assessment & Plan Note (Signed)
Acute and improved.  Will order repeat CXR he can obtain in upcoming weeks to recheck.  No further symptoms present.

## 2023-03-06 DIAGNOSIS — E041 Nontoxic single thyroid nodule: Secondary | ICD-10-CM | POA: Diagnosis not present

## 2023-03-07 ENCOUNTER — Other Ambulatory Visit: Payer: Self-pay | Admitting: Unknown Physician Specialty

## 2023-03-07 DIAGNOSIS — E041 Nontoxic single thyroid nodule: Secondary | ICD-10-CM

## 2023-03-08 ENCOUNTER — Telehealth: Payer: Self-pay

## 2023-03-08 NOTE — Telephone Encounter (Signed)
Transition Care Management Unsuccessful Follow-up Telephone Call  Date of discharge and from where:  02/04/2023 [facility]  Attempts:  1st Attempt  Reason for unsuccessful TCM follow-up call:  Left voice message  Nichael Ehly [facility]  [facility], [facility] Resource Care Guide Direct Dial: [phone]  Website: [email or web address]

## 2023-03-08 NOTE — Progress Notes (Signed)
Patient for US guided FNA Isthmus RT & RT Mid Thyroid Nodule Biopsies on Friday 03/09/2023, I called and spoke with the patient on the phone and gave pre-procedure instructions. Pt was made aware to be here at 2p and check in at the [facility]. Pt stated understanding.  Called 03/08/2023

## 2023-03-09 ENCOUNTER — Ambulatory Visit
Admission: RE | Admit: 2023-03-09 | Discharge: 2023-03-09 | Disposition: A | Discharge status: Home / Self Care | Payer: Medicare HMO | Source: Ambulatory Visit | Attending: Unknown Physician Specialty | Admitting: Unknown Physician Specialty

## 2023-03-09 ENCOUNTER — Telehealth: Payer: Self-pay

## 2023-03-09 DIAGNOSIS — E042 Nontoxic multinodular goiter: Secondary | ICD-10-CM | POA: Diagnosis not present

## 2023-03-09 DIAGNOSIS — E041 Nontoxic single thyroid nodule: Secondary | ICD-10-CM | POA: Diagnosis not present

## 2023-03-09 MED ORDER — LIDOCAINE HCL (PF) 1 % IJ SOLN
10.0000 mL | Freq: Once | INTRAMUSCULAR | Status: AC
  Administered 2023-03-09: 10 mL via INTRADERMAL
  Filled 2023-03-09: qty 10

## 2023-03-09 NOTE — Telephone Encounter (Signed)
Transition Care Management Unsuccessful Follow-up Telephone Call  Date of discharge and from where:  02/04/2023 [facility]  Attempts:  2nd Attempt  Reason for unsuccessful TCM follow-up call:  Left voice message  Bryannah Boston [facility]  [facility], [facility] Guide Direct Dial: [phone]  Website: [email or web address]

## 2023-03-16 LAB — CYTOLOGY - NON PAP

## 2023-03-19 DIAGNOSIS — E11649 Type 2 diabetes mellitus with hypoglycemia without coma: Secondary | ICD-10-CM | POA: Diagnosis not present

## 2023-03-30 ENCOUNTER — Ambulatory Visit: Payer: Medicare HMO | Admitting: Nurse Practitioner

## 2023-04-06 DIAGNOSIS — R918 Other nonspecific abnormal finding of lung field: Secondary | ICD-10-CM | POA: Diagnosis not present

## 2023-04-08 NOTE — Patient Instructions (Signed)
Be Involved in Caring For Your Health:  Taking Medications When medications are taken as directed, they can greatly improve your health. But if they are not taken as prescribed, they may not work. In some cases, not taking them correctly can be harmful. To help ensure your treatment remains effective and safe, understand your medications and how to take them. Bring your medications to each visit for review by your provider.  Your lab results, notes, and after visit summary will be available on My Chart. We strongly encourage you to use this feature. If lab results are abnormal the clinic will contact you with the appropriate steps. If the clinic does not contact you assume the results are satisfactory. You can always view your results on My Chart. If you have questions regarding your health or results, please contact the clinic during office hours. You can also ask questions on My Chart.  We at [facility] are grateful that you chose [place] to provide your care. We strive to provide evidence-based and compassionate care and are always looking for feedback. If you get a survey from the clinic please complete this so we can hear your opinions.  Healthy Eating, Adult Healthy eating may help you get and keep a healthy body weight, reduce the risk of chronic disease, and live a long and productive life. It is important to follow a healthy eating pattern. Your nutritional and calorie needs should be met mainly by different nutrient-rich foods. What are tips for following this plan? Reading food labels Read labels and choose the following: Reduced or low sodium products. Juices with 100% fruit juice. Foods with low saturated fats (<3 g per serving) and high polyunsaturated and monounsaturated fats. Foods with whole grains, such as whole wheat, cracked wheat, brown rice, and wild rice. Whole grains that are fortified with folic acid. This is recommended for females who are pregnant or who want to  become pregnant. Read labels and do not eat or drink the following: Foods or drinks with added sugars. These include foods that contain brown sugar, corn sweetener, corn syrup, dextrose, fructose, glucose, high-fructose corn syrup, honey, invert sugar, lactose, malt syrup, maltose, molasses, raw sugar, sucrose, trehalose, or turbinado sugar. Limit your intake of added sugars to less than 10% of your total daily calories. Do not eat more than the following amounts of added sugar per day: 6 teaspoons (25 g) for females. 9 teaspoons (38 g) for males. Foods that contain processed or refined starches and grains. Refined grain products, such as white flour, degermed cornmeal, white bread, and white rice. Shopping Choose nutrient-rich snacks, such as vegetables, whole fruits, and nuts. Avoid high-calorie and high-sugar snacks, such as potato chips, fruit snacks, and candy. Use oil-based dressings and spreads on foods instead of solid fats such as butter, margarine, sour cream, or cream cheese. Limit pre-made sauces, mixes, and "instant" products such as flavored rice, instant noodles, and ready-made pasta. Try more plant-protein sources, such as tofu, tempeh, black beans, edamame, lentils, nuts, and seeds. Explore eating plans such as the Mediterranean diet or vegetarian diet. Try heart-healthy dips made with beans and healthy fats like hummus and guacamole. Vegetables go great with these. Cooking Use oil to saut or stir-fry foods instead of solid fats such as butter, margarine, or lard. Try baking, boiling, grilling, or broiling instead of frying. Remove the fatty part of meats before cooking. Steam vegetables in water or broth. Meal planning  At meals, imagine dividing your plate into fourths: One-half of  your plate is fruits and vegetables. One-fourth of your plate is whole grains. One-fourth of your plate is protein, especially lean meats, poultry, eggs, tofu, beans, or nuts. Include low-fat  dairy as part of your daily diet. Lifestyle Choose healthy options in all settings, including home, work, school, restaurants, or stores. Prepare your food safely: Wash your hands after handling raw meats. Where you prepare food, keep surfaces clean by regularly washing with hot, soapy water. Keep raw meats separate from ready-to-eat foods, such as fruits and vegetables. Cook seafood, meat, poultry, and eggs to the recommended temperature. Get a food thermometer. Store foods at safe temperatures. In general: Keep cold foods at 48F (4.4C) or below. Keep hot foods at 148F (60C) or above. Keep your freezer at [facility] (-17.8C) or below. Foods are not safe to eat if they have been between the temperatures of 40-148F (4.4-60C) for more than 2 hours. What foods should I eat? Fruits Aim to eat 1-2 cups of fresh, canned (in natural juice), or frozen fruits each day. One cup of fruit equals 1 small apple, 1 large banana, 8 large strawberries, 1 cup (237 g) canned fruit,  cup (82 g) dried fruit, or 1 cup (240 mL) 100% juice. Vegetables Aim to eat 2-4 cups of fresh and frozen vegetables each day, including different varieties and colors. One cup of vegetables equals 1 cup (91 g) broccoli or cauliflower florets, 2 medium carrots, 2 cups (150 g) raw, leafy greens, 1 large tomato, 1 large bell pepper, 1 large sweet potato, or 1 medium white potato. Grains Aim to eat 5-10 ounce-equivalents of whole grains each day. Examples of 1 ounce-equivalent of grains include 1 slice of bread, 1 cup (40 g) ready-to-eat cereal, 3 cups (24 g) popcorn, or  cup (93 g) cooked rice. Meats and other proteins Try to eat 5-7 ounce-equivalents of protein each day. Examples of 1 ounce-equivalent of protein include 1 egg,  oz nuts (12 almonds, 24 pistachios, or 7 walnut halves), 1/4 cup (90 g) cooked beans, 6 tablespoons (90 g) hummus or 1 tablespoon (16 g) peanut butter. A cut of meat or fish that is the size of a deck of  cards is about 3-4 ounce-equivalents (85 g). Of the protein you eat each week, try to have at least 8 sounce (227 g) of seafood. This is about 2 servings per week. This includes salmon, trout, herring, sardines, and anchovies. Dairy Aim to eat 3 cup-equivalents of fat-free or low-fat dairy each day. Examples of 1 cup-equivalent of dairy include 1 cup (240 mL) milk, 8 ounces (250 g) yogurt, 1 ounces (44 g) natural cheese, or 1 cup (240 mL) fortified soy milk. Fats and oils Aim for about 5 teaspoons (21 g) of fats and oils per day. Choose monounsaturated fats, such as canola and olive oils, mayonnaise made with olive oil or avocado oil, avocados, peanut butter, and most nuts, or polyunsaturated fats, such as sunflower, corn, and soybean oils, walnuts, pine nuts, sesame seeds, sunflower seeds, and flaxseed. Beverages Aim for 6 eight-ounce glasses of water per day. Limit coffee to 3-5 eight-ounce cups per day. Limit caffeinated beverages that have added calories, such as soda and energy drinks. If you drink alcohol: Limit how much you have to: 0-1 drink a day if you are male. 0-2 drinks a day if you are male. Know how much alcohol is in your drink. In the [place] one drink is one 12 oz bottle of beer (355 mL), one 5 oz glass of wine (  148 mL), or one 1 oz glass of hard liquor (44 mL). Seasoning and other foods Try not to add too much salt to your food. Try using herbs and spices instead of salt. Try not to add sugar to food. This information is based on [place] nutrition guidelines. To learn more, visit DisposableNylon.be. Exact amounts may vary. You may need different amounts. This information is not intended to replace advice given to you by your health care provider. Make sure you discuss any questions you have with your health care provider. Document Revised: 02/06/2022 Document Reviewed: 02/06/2022 Elsevier Patient Education  2024 [employer].

## 2023-04-13 ENCOUNTER — Encounter: Payer: Self-pay | Admitting: Nurse Practitioner

## 2023-04-13 ENCOUNTER — Ambulatory Visit (INDEPENDENT_AMBULATORY_CARE_PROVIDER_SITE_OTHER): Payer: Medicare HMO | Admitting: Nurse Practitioner

## 2023-04-13 VITALS — BP 130/62 | HR 67 | Temp 98.0°F | Ht 75.0 in | Wt 325.6 lb

## 2023-04-13 DIAGNOSIS — E041 Nontoxic single thyroid nodule: Secondary | ICD-10-CM | POA: Diagnosis not present

## 2023-04-13 DIAGNOSIS — K5901 Slow transit constipation: Secondary | ICD-10-CM | POA: Diagnosis not present

## 2023-04-13 DIAGNOSIS — K5900 Constipation, unspecified: Secondary | ICD-10-CM | POA: Insufficient documentation

## 2023-04-13 NOTE — Assessment & Plan Note (Signed)
Improved at this time with Metamucil gummies, continue these and adjust plan as needed.

## 2023-04-13 NOTE — Assessment & Plan Note (Signed)
Ongoing, is going to see surgeon at [facility] on 04/25/23, plan is surgery in the future.

## 2023-04-13 NOTE — Progress Notes (Signed)
BP 130/62 (BP Location: Left Arm, Patient Position: Sitting, Cuff Size: Large)   Pulse 67   Temp 98 F (36.7 C) (Oral)   Ht 6\' 3"  (1.905 m)   Wt (!) 325 lb 9.6 oz (147.7 kg)   SpO2 98%   BMI 40.70 kg/m    Subjective:    Patient ID: Aaron [name], male    DOB: 1950-06-25, 72 y.o.   MRN: [id number]  HPI: Aaron [name] is a 72 y.o. male  Chief Complaint  Patient presents with   Thyroid Problem   Follow-up   Constipation   CONSTIPATION Follow-up today for constipation.  Started taking Metamucil gummies and is now passing bowels well.  Duration:weeks Status: better Treatments attempted: Metamucil Fever: no Nausea: no Vomiting: no Weight loss: no Decreased appetite: no Diarrhea: no Constipation: no Blood in stool: no Heartburn: no Jaundice: no Rash: no  THYROID NODULE Is going to [facility] to see surgeon on 04/25/23 and will discuss removal of nodule.  Nodule is 5.9 cm in diabmeter to right side. Fatigue: no Cold intolerance: no Heat intolerance: no Weight gain: no Weight loss: no Constipation: no Diarrhea/loose stools: no Palpitations: no Lower extremity edema: no Anxiety/depressed mood: no   Relevant past medical, surgical, family and social history reviewed and updated as indicated. Interim medical history since our last visit reviewed. Allergies and medications reviewed and updated.  Review of Systems  Constitutional:  Negative for activity change, diaphoresis, fatigue and fever.  Respiratory:  Negative for cough, chest tightness, shortness of breath and wheezing.   Cardiovascular:  Negative for chest pain, palpitations and leg swelling.  Gastrointestinal: Negative.   Endocrine: Negative for cold intolerance and heat intolerance.  Neurological: Negative.   Psychiatric/Behavioral: Negative.      Per HPI unless specifically indicated above     Objective:    BP 130/62 (BP Location: Left Arm, Patient Position: Sitting, Cuff Size: Large)   Pulse 67    Temp 98 F (36.7 C) (Oral)   Ht 6\' 3"  (1.905 m)   Wt (!) 325 lb 9.6 oz (147.7 kg)   SpO2 98%   BMI 40.70 kg/m   Wt Readings from Last 3 Encounters:  04/13/23 (!) 325 lb 9.6 oz (147.7 kg)  03/01/23 (!) 326 lb 6.4 oz (148.1 kg)  02/05/23 (!) 312 lb 9.6 oz (141.8 kg)    Physical Exam Vitals and nursing note reviewed.  Constitutional:      General: He is awake. He is not in acute distress.    Appearance: He is well-developed and well-groomed. He is obese. He is not ill-appearing or toxic-appearing.  HENT:     Head: Normocephalic.     Right Ear: Hearing and external ear normal.     Left Ear: Hearing and external ear normal.  Eyes:     General: Lids are normal.     Extraocular Movements: Extraocular movements intact.     Conjunctiva/sclera: Conjunctivae normal.  Neck:     Thyroid: No thyromegaly.     Vascular: No carotid bruit.  Cardiovascular:     Rate and Rhythm: Normal rate and regular rhythm.     Heart sounds: Normal heart sounds. No murmur heard.    No gallop.  Pulmonary:     Effort: No accessory muscle usage or respiratory distress.     Breath sounds: Normal breath sounds.  Abdominal:     General: Bowel sounds are normal. There is no distension.     Palpations: Abdomen is soft.  Tenderness: There is no abdominal tenderness.  Musculoskeletal:     Cervical back: Full passive range of motion without pain.     Right lower leg: No edema.     Left lower leg: No edema.  Lymphadenopathy:     Cervical: No cervical adenopathy.  Skin:    General: Skin is warm.     Capillary Refill: Capillary refill takes less than 2 seconds.  Neurological:     Mental Status: He is alert and oriented to person, place, and time.     Deep Tendon Reflexes: Reflexes are normal and symmetric.     Reflex Scores:      Brachioradialis reflexes are 2+ on the right side and 2+ on the left side.      Patellar reflexes are 2+ on the right side and 2+ on the left side. Psychiatric:        Attention  and Perception: Attention normal.        Mood and Affect: Mood normal.        Speech: Speech normal.        Behavior: Behavior normal. Behavior is cooperative.        Thought Content: Thought content normal.     Results for orders placed or performed during the hospital encounter of 03/09/23  Cytology - Non PAP;  Result Value Ref Range   CYTOLOGY - NON GYN      CYTOLOGY - NON PAP [facility] [address], [address] [place], [place] [address] Telephone [phone] or [phone] Fax [phone]  CYTOPATHOLOGY REPORT   Accession #: [id number] Patient Name: Aaron [name], Aaron [name] Visit # : [id number]  MRN: [id number] Physician: Anders Grant DOB/Age 12-06-1950 (Age: 64) Gender: M Collected Date: 03/09/2023 Received Date: 03/09/2023  FINAL DIAGNOSIS STATEMENT Of SPECIMEN ADEQUACY:  INTERPRETATION(S):      INSUFFICIENT FOR DIAGNOSIS (BETHESDA CATEGORY 0).      CONSISTENT WITH BENIGN FOLLICULAR NODULE (BETHESDA CATEGORY II).      ELECTRONIC SIGNATURE : Jacelyn Grip, John, Pathologist, Electronic Signature   CASE COMMENTS   CLINICAL HISTORY  SOURCE OF SPECIMEN(S) Thyroid, Fine Needle Aspiration Thyroid, Fine Needle Aspiration  SPECIMEN COMMENTS: SPECIMEN CLINICAL INFORMATION:    Gross Description Specimen: Received is/are 12cc of pink fluid in a sterile cytolyt vial      Prepare d:      # Smears: 0      # Concentration Technique Slides (i.e. ThinPrep): 1      # Cell Block: 0      # Diff-Quick Stain: 0      ThyroSeq testing will be performed if clinically indicated Specimen: Received is/are 12cc of pink fluid in a sterile cytolyt vial      Prepared:      # Smears: 0      # Concentration Technique Slides (i.e. ThinPrep): 1      # Cell Block: 0      # Diff-Quick Stain: 0      ThyroSeq testing will be performed if clinically indicated        Report signed out from the following location(s) [place] [address], [place] [address] CLIA #: [id number]  [facility] [address] [place], [place] [address] CLIA #: [id number]       Assessment & Plan:   Problem List Items Addressed This Visit       Endocrine   Thyroid nodule - Primary    Ongoing, is going  to see surgeon at [facility] on 04/25/23, plan is surgery in the future.         Other   Constipation    Improved at this time with Metamucil gummies, continue these and adjust plan as needed.        Follow up plan: Return in about 3 months (around 07/09/2023) for T2DM, HTN/HLD, THYROID NODULE, CKD + is due for nurse Medicare Wellness.

## 2023-04-16 DIAGNOSIS — E079 Disorder of thyroid, unspecified: Secondary | ICD-10-CM | POA: Diagnosis not present

## 2023-04-17 ENCOUNTER — Ambulatory Visit: Payer: Medicare HMO

## 2023-04-17 VITALS — Ht 75.0 in | Wt 325.0 lb

## 2023-04-17 DIAGNOSIS — Z0000 Encounter for general adult medical examination without abnormal findings: Secondary | ICD-10-CM

## 2023-04-17 NOTE — Patient Instructions (Addendum)
Mr. Brouillet , Thank you for taking time to come for your Medicare Wellness Visit. I appreciate your ongoing commitment to your health goals. Please review the following plan we discussed and let me know if I can assist you in the future.   Referrals/Orders/Follow-Ups/Clinician Recommendations: Get the shingles vaccines at your local pharmacy at your earliest convenience.  This is a list of the screening recommended for you and due dates:  Health Maintenance  Topic Date Due   Zoster (Shingles) Vaccine (1 of 2) 07/18/2002   COVID-19 Vaccine (5 - 2023-24 season) 07/02/2023   Hemoglobin A1C  07/19/2023   Eye exam for diabetics  09/05/2023   Yearly kidney health urinalysis for diabetes  11/27/2023   Complete foot exam   11/27/2023   Yearly kidney function blood test for diabetes  02/04/2024   Medicare Annual Wellness Visit  04/16/2024   Colon Cancer Screening  06/22/2024   DTaP/Tdap/Td vaccine (3 - Td or Tdap) 11/24/2031   Pneumonia Vaccine  Completed   Flu Shot  Completed   Hepatitis C Screening  Completed   HPV Vaccine  Aged Out    Advanced directives: (ACP Link)Information on Advanced Care Planning can be found at [facility] Advance Health Care Directives Advance Health Care Directives ([email or web address]   Once you have completed the forms, please bring a copy of your health care power of attorney and living will to the office to be added to your chart at your convenience.   Next Medicare Annual Wellness Visit scheduled for next year: Yes, 04/15/24 @ 3:10pm

## 2023-04-17 NOTE — Progress Notes (Signed)
Subjective:   Aaron [name] is a 72 y.o. male who presents for Medicare Annual/Subsequent preventive examination.  Visit Complete: Virtual I connected with  Annett Gula on 04/17/23 by a audio enabled telemedicine application and verified that I am speaking with the correct person using two identifiers.  Patient Location: Home  Provider Location: Office/Clinic  I discussed the limitations of evaluation and management by telemedicine. The patient expressed understanding and agreed to proceed.  Vital Signs: Because this visit was a virtual/telehealth visit, some criteria may be missing or patient reported. Any vitals not documented were not able to be obtained and vitals that have been documented are patient reported.         Objective:    Today's Vitals   04/17/23 1416  Weight: (!) 325 lb (147.4 kg)  Height: 6\' 3"  (1.905 m)  PainSc: 7    Body mass index is 40.62 kg/m.     04/17/2023    2:24 PM 02/04/2023   11:57 AM 06/22/2021    8:04 AM 02/13/2021   10:48 AM 11/09/2020    8:25 AM 10/23/2018    9:50 AM 11/27/2017    1:30 PM  Advanced Directives  Does Patient Have a Medical Advance Directive? No No No No No No No  Would patient like information on creating a medical advance directive? Yes (MAU/Ambulatory/Procedural Areas - Information given) No - Patient declined  No - Patient declined No - Patient declined  No - Patient declined    Current Medications (verified) Outpatient Encounter Medications as of 04/17/2023  Medication Sig   ACCU-CHEK GUIDE test strip    allopurinol (ZYLOPRIM) 300 MG tablet Take 1 tablet (300 mg total) by mouth daily.   amLODipine (NORVASC) 10 MG tablet Take 1 tablet (10 mg total) by mouth daily.   ASPIRIN 81 PO every evening.    atorvastatin (LIPITOR) 20 MG tablet Take 1 tablet (20 mg total) by mouth daily.   carvedilol (COREG) 25 MG tablet TAKE 1 TABLET TWICE DAILY WITH A MEAL   gabapentin (NEURONTIN) 600 MG tablet Take 1-1.5 tablets (600-900  mg total) by mouth at bedtime.   hydrochlorothiazide (HYDRODIURIL) 25 MG tablet Take 1 tablet (25 mg total) by mouth daily.   insulin aspart (NOVOLOG) 100 UNIT/ML injection Inject into the skin. Using via insulin pump  Medtronic MiniMed 630G insulin pump   Insulin Human (INSULIN PUMP) SOLN Inject into the skin.   losartan (COZAAR) 100 MG tablet Take 1 tablet (100 mg total) by mouth daily.   tamsulosin (FLOMAX) 0.4 MG CAPS capsule Take 1 capsule (0.4 mg total) by mouth daily.   insulin lispro (HUMALOG KWIKPEN) 200 UNIT/ML KwikPen Inject 75 Units into the skin 2 (two) times daily. (Patient not taking: Reported on 04/17/2023)   ondansetron (ZOFRAN-ODT) 4 MG disintegrating tablet Take 1 tablet (4 mg total) by mouth every 8 (eight) hours as needed for nausea or vomiting. (Patient not taking: Reported on 04/17/2023)   No facility-administered encounter medications on file as of 04/17/2023.    Allergies (verified) Glucophage [metformin]   History: Past Medical History:  Diagnosis Date   Cancer (HCC) 2013   prostate (radiation and surgery)   Diabetes mellitus without complication (HCC)    Gout    Hyperlipidemia    Hypertension    Sleep apnea    Spinal stenosis in cervical region 06/03/2020   Past Surgical History:  Procedure Laterality Date   CIRCUMCISION     COLONOSCOPY N/A 06/22/2021   Procedure: COLONOSCOPY;  Surgeon: Toledo, Boykin Nearing, MD;  Location: [facility] ENDOSCOPY;  Service: Gastroenterology;  Laterality: N/A;  IDDM   CYST EXCISION Left 2015   arm   deviated septrum  2003   JOINT REPLACEMENT Left 2016   Hip   PROSTATE SURGERY     prostatectomy due to cancer   PROSTATECTOMY  2013   TONSILLECTOMY AND ADENOIDECTOMY  2004   TOTAL HIP ARTHROPLASTY Left 2016   TOTAL HIP ARTHROPLASTY Right 11/27/2017   Procedure: TOTAL HIP ARTHROPLASTY ANTERIOR APPROACH;  Surgeon: Kennedy Bucker, MD;  Location: [facility] ORS;  Service: Orthopedics;  Laterality: Right;   Family History  Problem Relation Age  of Onset   Cancer Mother    Breast cancer Mother    Stroke Father    Cancer Sister    Cancer Maternal Uncle    Stroke Maternal Uncle    Social History   Socioeconomic History   Marital status: Married    Spouse name: Eber Jones   Number of children: 2   Years of education: Not on file   Highest education level: Some college, no degree  Occupational History   Occupation: retired  Tobacco Use   Smoking status: Never   Smokeless tobacco: Never  Vaping Use   Vaping status: Never Used  Substance and Sexual Activity   Alcohol use: No   Drug use: No   Sexual activity: Yes  Other Topics Concern   Not on file  Social History Narrative   Not on file   Social Determinants of Health   Financial Resource Strain: Low Risk  (04/17/2023)   Overall Financial Resource Strain (CARDIA)    Difficulty of Paying Living Expenses: Not hard at all  Food Insecurity: No Food Insecurity (04/17/2023)   Hunger Vital Sign    Worried About Running Out of Food in the Last Year: Never true    Ran Out of Food in the Last Year: Never true  Transportation Needs: No Transportation Needs (04/17/2023)   PRAPARE - Administrator, Civil Service (Medical): No    Lack of Transportation (Non-Medical): No  Physical Activity: Inactive (04/17/2023)   Exercise Vital Sign    Days of Exercise per Week: 0 days    Minutes of Exercise per Session: 0 min  Stress: No Stress Concern Present (04/17/2023)   [employer] of Occupational Health - Occupational Stress Questionnaire    Feeling of Stress : Not at all  Social Connections: Moderately Isolated (04/17/2023)   Social Connection and Isolation Panel [NHANES]    Frequency of Communication with Friends and Family: More than three times a week    Frequency of Social Gatherings with Friends and Family: Once a week    Attends Religious Services: Never    Database administrator or Organizations: No    Attends Engineer, structural: Never    Marital  Status: Married    Tobacco Counseling Counseling given: Not Answered   Clinical Intake:  Pre-visit preparation completed: Yes  Pain : 0-10 Pain Score: 7  Pain Type: Chronic pain Pain Location: Neck Pain Descriptors / Indicators: Aching     BMI - recorded: 40.62 Nutritional Status: BMI > 30  Obese Nutritional Risks: None Diabetes: Yes CBG done?: No (FBS 104 per patient) Did pt. bring in CBG monitor from home?: No  How often do you need to have someone help you when you read instructions, pamphlets, or other written materials from your doctor or pharmacy?: 1 - Never  Interpreter Needed?: No  Information  entered by :: Tora Kindred, CMA   Activities of Daily Living    11/27/2022    9:04 AM  In your present state of health, do you have any difficulty performing the following activities:  Hearing? 0  Vision? 0  Difficulty concentrating or making decisions? 0  Walking or climbing stairs? 0  Dressing or bathing? 0  Doing errands, shopping? 0    Patient Care Team: Marjie Skiff, NP as PCP - General (Nurse Practitioner) Raj Janus, MD as Consulting Physician (Endocrinology) Zettie Pho, [facility] (Inactive) (Pharmacist) Rodney Langton, RN as [facility] Care Management  Indicate any recent Medical Services you may have received from other than [facility] providers in the past year (date may be approximate).     Assessment:   This is a routine wellness examination for Elsa.  Hearing/Vision screen Hearing Screening - Comments:: Denies hearing loss Vision Screening - Comments:: Gets eye exams   Goals Addressed               This Visit's Progress     Patient Stated (pt-stated)        "Live another year"      Depression Screen    04/17/2023    2:23 PM 02/05/2023    1:17 PM 11/27/2022    8:50 AM 05/26/2022    9:03 AM 02/14/2022    9:12 AM 11/23/2021    9:44 AM 08/04/2021    8:34 AM  PHQ 2/9 Scores  PHQ - 2 Score 0 0 0 0 0 0 0  PHQ- 9 Score  0 0 0 0 0 0 0    Fall Risk    04/17/2023    2:25 PM 02/05/2023    1:17 PM 11/27/2022    8:50 AM 05/26/2022    9:03 AM 02/14/2022    9:08 AM  Fall Risk   Falls in the past year? 0 0 0 0 0  Number falls in past yr: 0 0 0 0 0  Injury with Fall? 0 0 0 0 0  Risk for fall due to : No Fall Risks No Fall Risks No Fall Risks    Follow up Falls prevention discussed Falls evaluation completed Falls evaluation completed Falls evaluation completed Falls evaluation completed;Education provided;Falls prevention discussed    MEDICARE RISK AT HOME: Medicare Risk at Home Any stairs in or around the home?: Yes If so, are there any without handrails?: No Home free of loose throw rugs in walkways, pet beds, electrical cords, etc?: Yes Adequate lighting in your home to reduce risk of falls?: Yes Life alert?: No Use of a cane, walker or w/c?: Yes (cane prn) Grab bars in the bathroom?: No Shower chair or bench in shower?: Yes Elevated toilet seat or a handicapped toilet?: Yes  TIMED UP AND GO:  Was the test performed?  No    Cognitive Function:        04/17/2023    2:26 PM 02/14/2022    9:10 AM 02/13/2021   10:39 AM 10/23/2018    9:51 AM  6CIT Screen  What Year? 0 points 0 points 0 points 0 points  What month? 0 points 0 points 0 points 0 points  What time? 0 points 0 points 0 points 0 points  Count back from 20 0 points  0 points 0 points  Months in reverse 0 points 0 points 0 points 0 points  Repeat phrase 0 points 0 points 0 points 0 points  Total Score 0 points  0 points 0 points    Immunizations Immunization History  Administered Date(s) Administered   Fluad Quad(high Dose 65+) 03/28/2019, 02/03/2020, 02/04/2021   Fluad Trivalent(High Dose 65+) 02/05/2023   Influenza Inj Mdck Quad Pf 02/28/2022   Influenza, High Dose Seasonal PF 03/29/2018   Influenza,inj,Quad PF,6+ Mos 07/11/2016, 02/15/2017   Influenza-Unspecified 03/26/2015   PFIZER(Purple Top)SARS-COV-2 Vaccination 08/05/2019,  08/26/2019, 02/25/2020   Pfizer(Comirnaty)Fall Seasonal Vaccine 12 years and older 03/01/2023   Pneumococcal Conjugate-13 03/21/2018   Pneumococcal Polysaccharide-23 07/02/2019   Pneumococcal-Unspecified 10/20/1992, 02/20/1999   Td 11/23/2021   Tdap 05/31/2011   Zoster, Live 07/11/2016    TDAP status: Up to date  Flu Vaccine status: Up to date  Pneumococcal vaccine status: Up to date  Covid-19 vaccine status: Completed vaccines  Qualifies for Shingles Vaccine? Yes   Zostavax completed Yes   Shingrix Completed?: No.    Education has been provided regarding the importance of this vaccine. Patient has been advised to call insurance company to determine out of pocket expense if they have not yet received this vaccine. Advised may also receive vaccine at local pharmacy or Health Dept. Verbalized acceptance and understanding.  Screening Tests Health Maintenance  Topic Date Due   Zoster Vaccines- Shingrix (1 of 2) 07/18/2002   Colonoscopy  06/22/2022   COVID-19 Vaccine (5 - 2023-24 season) 07/02/2023   HEMOGLOBIN A1C  07/19/2023   OPHTHALMOLOGY EXAM  09/05/2023   Diabetic kidney evaluation - Urine ACR  11/27/2023   FOOT EXAM  11/27/2023   Diabetic kidney evaluation - eGFR measurement  02/04/2024   Medicare Annual Wellness (AWV)  04/16/2024   DTaP/Tdap/Td (3 - Td or Tdap) 11/24/2031   Pneumonia Vaccine 43+ Years old  Completed   INFLUENZA VACCINE  Completed   Hepatitis C Screening  Completed   HPV VACCINES  Aged Out    Health Maintenance  Health Maintenance Due  Topic Date Due   Zoster Vaccines- Shingrix (1 of 2) 07/18/2002   Colonoscopy  06/22/2022    Colorectal cancer screening: Type of screening: Colonoscopy. Completed 06/22/21. Repeat every 3 years  Lung Cancer Screening: (Low Dose CT Chest recommended if Age 67-80 years, 20 pack-year currently smoking OR have quit w/in 15years.) does not qualify.   Lung Cancer Screening Referral: n/a  Additional  Screening:  Hepatitis C Screening: does not qualify; Completed 04/26/15  Vision Screening: Recommended annual ophthalmology exams for early detection of glaucoma and other disorders of the eye. Is the patient up to date with their annual eye exam?  Yes  Who is the provider or what is the name of the office in which the patient attends annual eye exams? Dr. Edger House If pt is not established with a provider, would they like to be referred to a provider to establish care? No .   Dental Screening: Recommended annual dental exams for proper oral hygiene  Diabetic Foot Exam: Diabetic Foot Exam: Completed 11/27/22  Community Resource Referral / Chronic Care Management: CRR required this visit?  No   CCM required this visit?  No     Plan:     I have personally reviewed and noted the following in the patient's chart:   Medical and social history Use of alcohol, tobacco or illicit drugs  Current medications and supplements including opioid prescriptions. Patient is not currently taking opioid prescriptions. Functional ability and status Nutritional status Physical activity Advanced directives List of other physicians Hospitalizations, surgeries, and ER visits in previous 12 months Vitals Screenings to include cognitive, depression,  and falls Referrals and appointments  In addition, I have reviewed and discussed with patient certain preventive protocols, quality metrics, and best practice recommendations. A written personalized care plan for preventive services as well as general preventive health recommendations were provided to patient.     Tora Kindred, CMA   04/17/2023   After Visit Summary: (MyChart) Due to this being a telephonic visit, the after visit summary with patients personalized plan was offered to patient via MyChart   Nurse Notes:  Declined shingles vaccine

## 2023-04-23 DIAGNOSIS — Z794 Long term (current) use of insulin: Secondary | ICD-10-CM | POA: Diagnosis not present

## 2023-04-23 DIAGNOSIS — E113293 Type 2 diabetes mellitus with mild nonproliferative diabetic retinopathy without macular edema, bilateral: Secondary | ICD-10-CM | POA: Diagnosis not present

## 2023-04-25 DIAGNOSIS — E079 Disorder of thyroid, unspecified: Secondary | ICD-10-CM | POA: Diagnosis not present

## 2023-04-26 DIAGNOSIS — E113293 Type 2 diabetes mellitus with mild nonproliferative diabetic retinopathy without macular edema, bilateral: Secondary | ICD-10-CM | POA: Diagnosis not present

## 2023-04-30 DIAGNOSIS — E042 Nontoxic multinodular goiter: Secondary | ICD-10-CM | POA: Diagnosis not present

## 2023-04-30 DIAGNOSIS — E113293 Type 2 diabetes mellitus with mild nonproliferative diabetic retinopathy without macular edema, bilateral: Secondary | ICD-10-CM | POA: Diagnosis not present

## 2023-04-30 DIAGNOSIS — Z794 Long term (current) use of insulin: Secondary | ICD-10-CM | POA: Diagnosis not present

## 2023-04-30 DIAGNOSIS — R748 Abnormal levels of other serum enzymes: Secondary | ICD-10-CM | POA: Diagnosis not present

## 2023-04-30 DIAGNOSIS — Z9641 Presence of insulin pump (external) (internal): Secondary | ICD-10-CM | POA: Diagnosis not present

## 2023-05-25 LAB — HEMOGLOBIN A1C: A1c: 7

## 2023-05-25 LAB — ALBUMIN, URINE, RANDOM: Albumin, Urine POC: <7

## 2023-05-25 NOTE — Patient Instructions (Signed)

## 2023-05-30 ENCOUNTER — Encounter: Payer: Self-pay | Admitting: Nurse Practitioner

## 2023-05-30 ENCOUNTER — Ambulatory Visit: Payer: Medicare HMO | Admitting: Nurse Practitioner

## 2023-05-30 VITALS — BP 129/63 | HR 68 | Temp 98.0°F | Wt 329.2 lb

## 2023-05-30 DIAGNOSIS — E1129 Type 2 diabetes mellitus with other diabetic kidney complication: Secondary | ICD-10-CM

## 2023-05-30 DIAGNOSIS — R911 Solitary pulmonary nodule: Secondary | ICD-10-CM

## 2023-05-30 DIAGNOSIS — I700 Atherosclerosis of aorta: Secondary | ICD-10-CM | POA: Diagnosis not present

## 2023-05-30 DIAGNOSIS — E1169 Type 2 diabetes mellitus with other specified complication: Secondary | ICD-10-CM

## 2023-05-30 DIAGNOSIS — G4733 Obstructive sleep apnea (adult) (pediatric): Secondary | ICD-10-CM | POA: Diagnosis not present

## 2023-05-30 DIAGNOSIS — E1159 Type 2 diabetes mellitus with other circulatory complications: Secondary | ICD-10-CM

## 2023-05-30 DIAGNOSIS — E1122 Type 2 diabetes mellitus with diabetic chronic kidney disease: Secondary | ICD-10-CM

## 2023-05-30 DIAGNOSIS — N183 Chronic kidney disease, stage 3 unspecified: Secondary | ICD-10-CM

## 2023-05-30 DIAGNOSIS — G894 Chronic pain syndrome: Secondary | ICD-10-CM | POA: Diagnosis not present

## 2023-05-30 DIAGNOSIS — Z9641 Presence of insulin pump (external) (internal): Secondary | ICD-10-CM

## 2023-05-30 DIAGNOSIS — E041 Nontoxic single thyroid nodule: Secondary | ICD-10-CM

## 2023-05-30 DIAGNOSIS — Z6841 Body Mass Index (BMI) 40.0 and over, adult: Secondary | ICD-10-CM

## 2023-05-30 NOTE — Assessment & Plan Note (Signed)
 Chronic, ongoing, followed by endocrinology with insulin  pump in place.  Continue current collaboration and pump settings per endo -- recent note reviewed, will defer pump changes to them.  A1c 7% with them -- urine ALB < 28 April 2023, continue Losartan  for kidney protection.  Recommend focus on diet and modest weight loss.  Does endorse poor diet on occasion.  Recommend he continue to monitor BS closely at home and if any consistent <70 immediately alert endo for pump changes.  Return in 6 months. - Vaccinations up to date - ARB and statin on board - Foot and eye exam up to date.

## 2023-05-30 NOTE — Assessment & Plan Note (Signed)
Chronic.  Noted on 09/16/19 imaging.  Continue daily statin and ASA for prevention + focus on healthy diet and modest weight loss. 

## 2023-05-30 NOTE — Assessment & Plan Note (Signed)
Chronic, ongoing to cervical spine.  Continue collaboration with pain management and neurosurgery on as needed basis, notes reviewed.  Gabapentin refills up to date.

## 2023-05-30 NOTE — Progress Notes (Signed)
 BP 129/63   Pulse 68   Temp 98 F (36.7 C) (Oral)   Wt (!) 329 lb 3.2 oz (149.3 kg)   SpO2 97%   BMI 41.15 kg/m    Subjective:    Patient ID: Aaron [name], male    DOB: [birth year], 73 y.o.   MRN: [id number]  HPI: Aaron [name] is a 73 y.o. male  Chief Complaint  Patient presents with   Diabetes   Hyperlipidemia   Hypertension   Gout   Pain   History of prostate cancer, recent PSA stable, returns to see urology in April 2025.  DIABETES Follows with Dr. Damian , last visit 04/23/23, A1c at time 7%.  Continues to take Gabapentin  600 MG for chronic pain to neck area.  Was started on Vitamin D weekly by endo.   Uses Tandem T:slim insulin  pump (04/2020 placed), per Dr. Damian last note 09/12/22: Basal rates Correction factor Carb ratio Target BG 12 am 2.45 units/hr 20 7.6 110 4 am 1.85 units/hr 20 7.6 110 8:30 am 1.1 units/hr 20 7.6 110 11 am 1.5 units.hr 20 5.6 110 8 pm 2.2 units/hr 20 5.6 110  24-hr basal = 43.175 units Humalog U200 insulin  (equals 86.35 units of U100 insulin ) Hypoglycemic episodes:no Hypoglycemic episodes:no Polydipsia/polyuria: no Visual disturbance: no Chest pain: no Paresthesias: no Glucose Monitoring: yes  Accucheck frequency:  DEXA  Fasting glucose: 104 to 118  Post prandial:  Evening:  Before meals: Taking Insulin ?: yes -- as above  Long acting insulin :  Short acting insulin : Blood Pressure Monitoring: not checking Retinal Examination: Up to Date Foot Exam: Up to Date Diabetic Education: Completed Pneumovax: Up to Date Influenza: Up to Date Aspirin : yes   HYPERTENSION / HYPERLIPIDEMIA Taking Losartan  100 MG, HCTZ 25 MG daily, Carvedilol  25 MG BID, Amlodipine  10 MG daily, and Atorvastatin  20 MG daily.     Has OSA and uses CPAP every night.  History on 09/16/19 imaging aortic atherosclerosis noted. Satisfied with current treatment? yes Duration of hypertension: chronic BP monitoring frequency: not checking BP range:  BP  medication side effects: no Duration of hyperlipidemia: chronic Cholesterol medication side effects: no Cholesterol supplements: none Medication compliance: good compliance Aspirin : yes Recent stressors: no Recurrent headaches: no Visual changes: no Palpitations: no Dyspnea: no Chest pain: no Lower extremity edema: no Dizzy/lightheaded: no  GOUT Continues on Allopurinol . Has not had a flare in many years.  Did see pain clinic in past for chronic back pain, but he reports there was nothing more they could do for him.  No visits since 04/19/21. Did physical therapy in past without benefit.  No history of injections. Neurosurgery seen in past, they recommended against surgery for him. Duration:chronic Swelling: no Redness: no Trauma: no Recent dietary change or indiscretion: no Fevers: no Nausea/vomiting: no Aggravating factors: Alleviating factors:  Status:  better Treatments attempted: Allopurinol   THYROID  NODULE & LUNG NODULES He is scheduled for right side lobectomy on 06/04/23.  On CT lung 02/04/23 lung nodules noted right middle lobe with recommend to repeat imaging in 3 months, he wishes to hold off at this time and work on thyroid  first.  Never a smoker. Fatigue: no Cold intolerance: no Heat intolerance: no Weight gain: no Weight loss: no Constipation: no Diarrhea/loose stools: no Palpitations: no Lower extremity edema: no Anxiety/depressed mood: no    05/30/2023    8:38 AM 04/17/2023    2:23 PM 02/05/2023    1:17 PM 11/27/2022    8:50 AM 05/26/2022  9:03 AM  Depression screen PHQ 2/9  Decreased Interest 0 0 0 0 0  Down, Depressed, Hopeless 0 0 0 0 0  PHQ - 2 Score 0 0 0 0 0  Altered sleeping 0 0 0 0 0  Tired, decreased energy 0 0 0 0 0  Change in appetite 0 0 0 0 0  Feeling bad or failure about yourself  0 0 0 0 0  Trouble concentrating 0 0 0 0 0  Moving slowly or fidgety/restless 0 0 0 0 0  Suicidal thoughts 0 0 0 0 0  PHQ-9 Score 0 0 0 0 0  Difficult doing  work/chores Not difficult at all Not difficult at all Not difficult at all Not difficult at all Not difficult at all       05/30/2023    8:39 AM 02/05/2023    1:18 PM 11/27/2022    8:50 AM 05/26/2022    9:03 AM  GAD 7 : Generalized Anxiety Score  Nervous, Anxious, on Edge 0 0 0 0  Control/stop worrying 0 0 0 0  Worry too much - different things 0 0 0 0  Trouble relaxing 0 0 0 0  Restless 0 0 0 0  Easily annoyed or irritable 0 0 0 0  Afraid - awful might happen 0 0 0 0  Total GAD 7 Score 0 0 0 0  Anxiety Difficulty Not difficult at all Not difficult at all Not difficult at all Not difficult at all   Relevant past medical, surgical, family and social history reviewed and updated as indicated. Interim medical history since our last visit reviewed. Allergies and medications reviewed and updated.  Review of Systems  Constitutional:  Negative for activity change, diaphoresis, fatigue and fever.  Respiratory:  Negative for cough, chest tightness, shortness of breath and wheezing.   Cardiovascular:  Negative for chest pain, palpitations and leg swelling.  Gastrointestinal: Negative.   Endocrine: Negative for cold intolerance, heat intolerance, polydipsia, polyphagia and polyuria.  Neurological:  Negative for dizziness, syncope, weakness, light-headedness, numbness and headaches.  Psychiatric/Behavioral: Negative.      Per HPI unless specifically indicated above     Objective:    BP 129/63   Pulse 68   Temp 98 F (36.7 C) (Oral)   Wt (!) 329 lb 3.2 oz (149.3 kg)   SpO2 97%   BMI 41.15 kg/m   Wt Readings from Last 3 Encounters:  05/30/23 (!) 329 lb 3.2 oz (149.3 kg)  04/17/23 (!) 325 lb (147.4 kg)  04/13/23 (!) 325 lb 9.6 oz (147.7 kg)    Physical Exam Vitals and nursing note reviewed.  Constitutional:      General: He is awake. He is not in acute distress.    Appearance: He is well-developed and well-groomed. He is obese. He is not ill-appearing or toxic-appearing.  HENT:      Head: Normocephalic.     Right Ear: Hearing and external ear normal.     Left Ear: Hearing and external ear normal.  Eyes:     General: Lids are normal.     Extraocular Movements: Extraocular movements intact.     Conjunctiva/sclera: Conjunctivae normal.  Neck:     Thyroid : No thyromegaly.     Vascular: No carotid bruit.  Cardiovascular:     Rate and Rhythm: Normal rate and regular rhythm.     Heart sounds: Normal heart sounds. No murmur heard.    No gallop.  Pulmonary:     Effort: No accessory  muscle usage or respiratory distress.     Breath sounds: Normal breath sounds.  Abdominal:     General: Bowel sounds are normal. There is no distension.     Palpations: Abdomen is soft.     Tenderness: There is no abdominal tenderness.  Musculoskeletal:     Cervical back: Full passive range of motion without pain.     Right lower leg: No edema.     Left lower leg: No edema.  Lymphadenopathy:     Cervical: No cervical adenopathy.  Skin:    General: Skin is warm.     Capillary Refill: Capillary refill takes less than 2 seconds.  Neurological:     Mental Status: He is alert and oriented to person, place, and time.     Deep Tendon Reflexes: Reflexes are normal and symmetric.     Reflex Scores:      Brachioradialis reflexes are 2+ on the right side and 2+ on the left side.      Patellar reflexes are 2+ on the right side and 2+ on the left side. Psychiatric:        Attention and Perception: Attention normal.        Mood and Affect: Mood normal.        Speech: Speech normal.        Behavior: Behavior normal. Behavior is cooperative.        Thought Content: Thought content normal.    Results for orders placed or performed in visit on 05/30/23  Hemoglobin A1c   Collection Time: 04/23/23 12:00 AM  Result Value Ref Range   A1c 7%   Albumin, urine, random   Collection Time: 04/23/23 12:00 AM  Result Value Ref Range   Albumin, Urine POC <7       Assessment & Plan:   Problem List  Items Addressed This Visit       Cardiovascular and Mediastinum   Aortic atherosclerosis (HCC)   Chronic.  Noted on 09/16/19 imaging.  Continue daily statin and ASA for prevention + focus on healthy diet and modest weight loss.      Hypertension associated with diabetes (HCC)   Chronic, stable.  BP at goal on recheck today and at goal on home checks..  Recommend he continue to check BP at home twice daily and document for provider visits. Continue current medication regimen at this time, is on max dose multiple medications.  May need to adjust in future and change HCTZ if return of gout flares or decline in kidney function.  Labs: up to date with endo.  Urine ALB <28 April 2023, maintain ARB.  Return in 6 months.        Respiratory   Nodule of middle lobe of right lung   Noted on CT imaging in ER on 02/04/23, right middle lobe nodule 16 x 10 mm.  Recommendation for repeat CT in 3 months be considered.  Discussed with patient.  He wishes to hold off on this at this time.      Sleep apnea   Chronic, ongoing.  Continues to use CPAP faithfully every night and denies any issues with this.          Endocrine   CKD stage 3 due to type 2 diabetes mellitus (HCC)   Has been stable for 2 years, will continue to monitor and if remains stable with discontinue this diagnosis.  Urine ALB <28 April 2023 with endo, maintain ARB on board.      Hyperlipidemia associated  with type 2 diabetes mellitus (HCC)   Chronic, ongoing.  Continue current medication regimen and adjust as needed.  Labs up to date with endo.       Thyroid  nodule   Scheduled for lobectomy upcoming, continue collaboration with endo and surgical team.      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  Continue current collaboration and pump settings per endo -- recent note reviewed, will defer pump changes to them.  A1c 7% with them -- urine  ALB < 28 April 2023, continue Losartan  for kidney protection.  Recommend focus on diet and modest weight loss.  Does endorse poor diet on occasion.  Recommend he continue to monitor BS closely at home and if any consistent <70 immediately alert endo for pump changes.  Return in 6 months. - Vaccinations up to date - ARB and statin on board - Foot and eye exam up to date.        Other   BMI 40.0-44.9, adult (HCC)   Recommend focus on modest weight loss with diet changes and regular exercise regimen.        Chronic pain syndrome   Chronic, ongoing to cervical spine.  Continue collaboration with pain management and neurosurgery on as needed basis, notes reviewed.  Gabapentin  refills up to date.      Morbid obesity (HCC)   BMI 41.15 with T2DM, HTN.  Recommended eating smaller high protein, low fat meals more frequently and exercising 30 mins a day 5 times a week with a goal of 10-15lb weight loss in the next 3 months. Patient voiced their understanding and motivation to adhere to these recommendations.       Presence of insulin  pump   Continue to collaborate with endocrinology.        Follow up plan: Return in about 6 months (around 11/27/2023) for Annual Physical -- diabetes check -- due after 11/27/2023.

## 2023-05-30 NOTE — Assessment & Plan Note (Signed)
Chronic, ongoing.  Continues to use CPAP faithfully every night and denies any issues with this.

## 2023-05-30 NOTE — Assessment & Plan Note (Signed)
Continue to collaborate with endocrinology. 

## 2023-05-30 NOTE — Assessment & Plan Note (Signed)
BMI 41.15 with T2DM, HTN.  Recommended eating smaller high protein, low fat meals more frequently and exercising 30 mins a day 5 times a week with a goal of 10-15lb weight loss in the next 3 months. Patient voiced their understanding and motivation to adhere to these recommendations.

## 2023-05-30 NOTE — Assessment & Plan Note (Signed)
 Noted on CT imaging in ER on 02/04/23, right middle lobe nodule 16 x 10 mm.  Recommendation for repeat CT in 3 months be considered.  Discussed with patient.  He wishes to hold off on this at this time.

## 2023-05-30 NOTE — Assessment & Plan Note (Signed)
 Has been stable for 2 years, will continue to monitor and if remains stable with discontinue this diagnosis.  Urine ALB <28 April 2023 with endo, maintain ARB on board.

## 2023-05-30 NOTE — Assessment & Plan Note (Signed)
Recommend focus on modest weight loss with diet changes and regular exercise regimen.   

## 2023-05-30 NOTE — Assessment & Plan Note (Addendum)
 Chronic, ongoing.  Continue current medication regimen and adjust as needed.  Labs up to date with endo.

## 2023-05-30 NOTE — Assessment & Plan Note (Signed)
 Chronic, stable.  BP at goal on recheck today and at goal on home checks..  Recommend he continue to check BP at home twice daily and document for provider visits. Continue current medication regimen at this time, is on max dose multiple medications.  May need to adjust in future and change HCTZ if return of gout flares or decline in kidney function.  Labs: up to date with endo.  Urine ALB <28 April 2023, maintain ARB.  Return in 6 months.

## 2023-05-30 NOTE — Assessment & Plan Note (Signed)
 Scheduled for lobectomy upcoming, continue collaboration with endo and surgical team.

## 2023-06-04 DIAGNOSIS — D72829 Elevated white blood cell count, unspecified: Secondary | ICD-10-CM | POA: Diagnosis not present

## 2023-06-04 DIAGNOSIS — G4733 Obstructive sleep apnea (adult) (pediatric): Secondary | ICD-10-CM | POA: Diagnosis not present

## 2023-06-04 DIAGNOSIS — D34 Benign neoplasm of thyroid gland: Secondary | ICD-10-CM | POA: Diagnosis not present

## 2023-06-04 DIAGNOSIS — E1140 Type 2 diabetes mellitus with diabetic neuropathy, unspecified: Secondary | ICD-10-CM | POA: Diagnosis not present

## 2023-06-04 DIAGNOSIS — E049 Nontoxic goiter, unspecified: Secondary | ICD-10-CM | POA: Diagnosis not present

## 2023-06-04 DIAGNOSIS — N189 Chronic kidney disease, unspecified: Secondary | ICD-10-CM | POA: Diagnosis not present

## 2023-06-04 DIAGNOSIS — G8929 Other chronic pain: Secondary | ICD-10-CM | POA: Diagnosis not present

## 2023-06-04 DIAGNOSIS — I129 Hypertensive chronic kidney disease with stage 1 through stage 4 chronic kidney disease, or unspecified chronic kidney disease: Secondary | ICD-10-CM | POA: Diagnosis not present

## 2023-06-04 DIAGNOSIS — L918 Other hypertrophic disorders of the skin: Secondary | ICD-10-CM | POA: Diagnosis not present

## 2023-06-04 DIAGNOSIS — K219 Gastro-esophageal reflux disease without esophagitis: Secondary | ICD-10-CM | POA: Diagnosis not present

## 2023-06-04 DIAGNOSIS — Z79899 Other long term (current) drug therapy: Secondary | ICD-10-CM | POA: Diagnosis not present

## 2023-06-04 DIAGNOSIS — Z794 Long term (current) use of insulin: Secondary | ICD-10-CM | POA: Diagnosis not present

## 2023-06-04 DIAGNOSIS — E041 Nontoxic single thyroid nodule: Secondary | ICD-10-CM | POA: Diagnosis not present

## 2023-06-04 DIAGNOSIS — E1122 Type 2 diabetes mellitus with diabetic chronic kidney disease: Secondary | ICD-10-CM | POA: Diagnosis not present

## 2023-06-04 DIAGNOSIS — E0789 Other specified disorders of thyroid: Secondary | ICD-10-CM | POA: Diagnosis not present

## 2023-06-04 DIAGNOSIS — R1310 Dysphagia, unspecified: Secondary | ICD-10-CM | POA: Diagnosis not present

## 2023-06-04 DIAGNOSIS — E1165 Type 2 diabetes mellitus with hyperglycemia: Secondary | ICD-10-CM | POA: Diagnosis not present

## 2023-06-04 DIAGNOSIS — Z7984 Long term (current) use of oral hypoglycemic drugs: Secondary | ICD-10-CM | POA: Diagnosis not present

## 2023-06-04 DIAGNOSIS — E785 Hyperlipidemia, unspecified: Secondary | ICD-10-CM | POA: Diagnosis not present

## 2023-06-05 DIAGNOSIS — G8929 Other chronic pain: Secondary | ICD-10-CM | POA: Diagnosis not present

## 2023-06-05 DIAGNOSIS — I129 Hypertensive chronic kidney disease with stage 1 through stage 4 chronic kidney disease, or unspecified chronic kidney disease: Secondary | ICD-10-CM | POA: Diagnosis not present

## 2023-06-05 DIAGNOSIS — Z794 Long term (current) use of insulin: Secondary | ICD-10-CM | POA: Diagnosis not present

## 2023-06-05 DIAGNOSIS — E041 Nontoxic single thyroid nodule: Secondary | ICD-10-CM | POA: Diagnosis not present

## 2023-06-05 DIAGNOSIS — E1122 Type 2 diabetes mellitus with diabetic chronic kidney disease: Secondary | ICD-10-CM | POA: Diagnosis not present

## 2023-06-05 DIAGNOSIS — E1165 Type 2 diabetes mellitus with hyperglycemia: Secondary | ICD-10-CM | POA: Diagnosis not present

## 2023-06-05 DIAGNOSIS — D72829 Elevated white blood cell count, unspecified: Secondary | ICD-10-CM | POA: Diagnosis not present

## 2023-06-05 DIAGNOSIS — N189 Chronic kidney disease, unspecified: Secondary | ICD-10-CM | POA: Diagnosis not present

## 2023-06-05 DIAGNOSIS — R1310 Dysphagia, unspecified: Secondary | ICD-10-CM | POA: Diagnosis not present

## 2023-06-05 DIAGNOSIS — K219 Gastro-esophageal reflux disease without esophagitis: Secondary | ICD-10-CM | POA: Diagnosis not present

## 2023-06-05 DIAGNOSIS — Z7984 Long term (current) use of oral hypoglycemic drugs: Secondary | ICD-10-CM | POA: Diagnosis not present

## 2023-06-05 DIAGNOSIS — E1140 Type 2 diabetes mellitus with diabetic neuropathy, unspecified: Secondary | ICD-10-CM | POA: Diagnosis not present

## 2023-06-05 DIAGNOSIS — Z79899 Other long term (current) drug therapy: Secondary | ICD-10-CM | POA: Diagnosis not present

## 2023-06-05 DIAGNOSIS — E785 Hyperlipidemia, unspecified: Secondary | ICD-10-CM | POA: Diagnosis not present

## 2023-06-05 DIAGNOSIS — G4733 Obstructive sleep apnea (adult) (pediatric): Secondary | ICD-10-CM | POA: Diagnosis not present

## 2023-06-06 DIAGNOSIS — R1310 Dysphagia, unspecified: Secondary | ICD-10-CM | POA: Diagnosis not present

## 2023-06-06 DIAGNOSIS — Z79899 Other long term (current) drug therapy: Secondary | ICD-10-CM | POA: Diagnosis not present

## 2023-06-06 DIAGNOSIS — E785 Hyperlipidemia, unspecified: Secondary | ICD-10-CM | POA: Diagnosis not present

## 2023-06-06 DIAGNOSIS — E1122 Type 2 diabetes mellitus with diabetic chronic kidney disease: Secondary | ICD-10-CM | POA: Diagnosis not present

## 2023-06-06 DIAGNOSIS — Z7952 Long term (current) use of systemic steroids: Secondary | ICD-10-CM | POA: Diagnosis not present

## 2023-06-06 DIAGNOSIS — N189 Chronic kidney disease, unspecified: Secondary | ICD-10-CM | POA: Diagnosis not present

## 2023-06-06 DIAGNOSIS — G4733 Obstructive sleep apnea (adult) (pediatric): Secondary | ICD-10-CM | POA: Diagnosis not present

## 2023-06-06 DIAGNOSIS — K219 Gastro-esophageal reflux disease without esophagitis: Secondary | ICD-10-CM | POA: Diagnosis not present

## 2023-06-06 DIAGNOSIS — I129 Hypertensive chronic kidney disease with stage 1 through stage 4 chronic kidney disease, or unspecified chronic kidney disease: Secondary | ICD-10-CM | POA: Diagnosis not present

## 2023-06-06 DIAGNOSIS — Z7984 Long term (current) use of oral hypoglycemic drugs: Secondary | ICD-10-CM | POA: Diagnosis not present

## 2023-06-06 DIAGNOSIS — D72829 Elevated white blood cell count, unspecified: Secondary | ICD-10-CM | POA: Diagnosis not present

## 2023-06-06 DIAGNOSIS — E1165 Type 2 diabetes mellitus with hyperglycemia: Secondary | ICD-10-CM | POA: Diagnosis not present

## 2023-06-06 DIAGNOSIS — E041 Nontoxic single thyroid nodule: Secondary | ICD-10-CM | POA: Diagnosis not present

## 2023-06-06 DIAGNOSIS — G8929 Other chronic pain: Secondary | ICD-10-CM | POA: Diagnosis not present

## 2023-06-06 DIAGNOSIS — E1140 Type 2 diabetes mellitus with diabetic neuropathy, unspecified: Secondary | ICD-10-CM | POA: Diagnosis not present

## 2023-06-06 DIAGNOSIS — Z794 Long term (current) use of insulin: Secondary | ICD-10-CM | POA: Diagnosis not present

## 2023-06-20 DIAGNOSIS — E079 Disorder of thyroid, unspecified: Secondary | ICD-10-CM | POA: Diagnosis not present

## 2023-07-05 DIAGNOSIS — E119 Type 2 diabetes mellitus without complications: Secondary | ICD-10-CM | POA: Diagnosis not present

## 2023-07-16 ENCOUNTER — Ambulatory Visit: Payer: Medicare HMO | Admitting: Nurse Practitioner

## 2023-07-30 DIAGNOSIS — R7989 Other specified abnormal findings of blood chemistry: Secondary | ICD-10-CM | POA: Diagnosis not present

## 2023-07-30 DIAGNOSIS — E113293 Type 2 diabetes mellitus with mild nonproliferative diabetic retinopathy without macular edema, bilateral: Secondary | ICD-10-CM | POA: Diagnosis not present

## 2023-07-30 DIAGNOSIS — E559 Vitamin D deficiency, unspecified: Secondary | ICD-10-CM | POA: Diagnosis not present

## 2023-07-30 DIAGNOSIS — Z794 Long term (current) use of insulin: Secondary | ICD-10-CM | POA: Diagnosis not present

## 2023-08-06 DIAGNOSIS — E113293 Type 2 diabetes mellitus with mild nonproliferative diabetic retinopathy without macular edema, bilateral: Secondary | ICD-10-CM | POA: Diagnosis not present

## 2023-08-06 DIAGNOSIS — Z794 Long term (current) use of insulin: Secondary | ICD-10-CM | POA: Diagnosis not present

## 2023-08-06 DIAGNOSIS — Z9641 Presence of insulin pump (external) (internal): Secondary | ICD-10-CM | POA: Diagnosis not present

## 2023-08-06 DIAGNOSIS — I10 Essential (primary) hypertension: Secondary | ICD-10-CM | POA: Diagnosis not present

## 2023-08-06 DIAGNOSIS — R748 Abnormal levels of other serum enzymes: Secondary | ICD-10-CM | POA: Diagnosis not present

## 2023-08-06 DIAGNOSIS — E559 Vitamin D deficiency, unspecified: Secondary | ICD-10-CM | POA: Diagnosis not present

## 2023-08-24 ENCOUNTER — Ambulatory Visit: Payer: Self-pay | Admitting: Urology

## 2023-08-28 DIAGNOSIS — M542 Cervicalgia: Secondary | ICD-10-CM | POA: Diagnosis not present

## 2023-08-28 DIAGNOSIS — M4003 Postural kyphosis, cervicothoracic region: Secondary | ICD-10-CM | POA: Diagnosis not present

## 2023-08-28 DIAGNOSIS — M8580 Other specified disorders of bone density and structure, unspecified site: Secondary | ICD-10-CM | POA: Diagnosis not present

## 2023-10-04 DIAGNOSIS — M47812 Spondylosis without myelopathy or radiculopathy, cervical region: Secondary | ICD-10-CM | POA: Diagnosis not present

## 2023-10-04 DIAGNOSIS — M4003 Postural kyphosis, cervicothoracic region: Secondary | ICD-10-CM | POA: Diagnosis not present

## 2023-10-04 DIAGNOSIS — M488X2 Other specified spondylopathies, cervical region: Secondary | ICD-10-CM | POA: Diagnosis not present

## 2023-10-04 DIAGNOSIS — M2578 Osteophyte, vertebrae: Secondary | ICD-10-CM | POA: Diagnosis not present

## 2023-10-04 DIAGNOSIS — M4802 Spinal stenosis, cervical region: Secondary | ICD-10-CM | POA: Diagnosis not present

## 2023-10-04 DIAGNOSIS — G959 Disease of spinal cord, unspecified: Secondary | ICD-10-CM | POA: Diagnosis not present

## 2023-11-05 DIAGNOSIS — I10 Essential (primary) hypertension: Secondary | ICD-10-CM | POA: Diagnosis not present

## 2023-11-05 DIAGNOSIS — E113293 Type 2 diabetes mellitus with mild nonproliferative diabetic retinopathy without macular edema, bilateral: Secondary | ICD-10-CM | POA: Diagnosis not present

## 2023-11-05 DIAGNOSIS — E559 Vitamin D deficiency, unspecified: Secondary | ICD-10-CM | POA: Diagnosis not present

## 2023-11-05 DIAGNOSIS — Z794 Long term (current) use of insulin: Secondary | ICD-10-CM | POA: Diagnosis not present

## 2023-11-05 DIAGNOSIS — R748 Abnormal levels of other serum enzymes: Secondary | ICD-10-CM | POA: Diagnosis not present

## 2023-11-08 DIAGNOSIS — E559 Vitamin D deficiency, unspecified: Secondary | ICD-10-CM | POA: Diagnosis not present

## 2023-11-08 DIAGNOSIS — Z794 Long term (current) use of insulin: Secondary | ICD-10-CM | POA: Diagnosis not present

## 2023-11-08 DIAGNOSIS — R7989 Other specified abnormal findings of blood chemistry: Secondary | ICD-10-CM | POA: Diagnosis not present

## 2023-11-08 DIAGNOSIS — E113293 Type 2 diabetes mellitus with mild nonproliferative diabetic retinopathy without macular edema, bilateral: Secondary | ICD-10-CM | POA: Diagnosis not present

## 2023-11-08 DIAGNOSIS — I10 Essential (primary) hypertension: Secondary | ICD-10-CM | POA: Diagnosis not present

## 2023-12-02 LAB — HEMOGLOBIN A1C: Hemoglobin-A1c: 6.8

## 2023-12-02 NOTE — Patient Instructions (Signed)
Be Involved in Caring For Your Health:  Taking Medications When medications are taken as directed, they can greatly improve your health. But if they are not taken as prescribed, they may not work. In some cases, not taking them correctly can be harmful. To help ensure your treatment remains effective and safe, understand your medications and how to take them. Bring your medications to each visit for review by your provider.  Your lab results, notes, and after visit summary will be available on My Chart. We strongly encourage you to use this feature. If lab results are abnormal the clinic will contact you with the appropriate steps. If the clinic does not contact you assume the results are satisfactory. You can always view your results on My Chart. If you have questions regarding your health or results, please contact the clinic during office hours. You can also ask questions on My Chart.  We at [facility] are grateful that you chose [place] to provide your care. We strive to provide evidence-based and compassionate care and are always looking for feedback. If you get a survey from the clinic please complete this so we can hear your opinions.  Diabetes Mellitus and Exercise Regular exercise is important for your health, especially if you have diabetes mellitus. Exercise is not just about losing weight. It can also help you increase muscle strength and bone density and reduce body fat and stress. This can help your level of endurance and make you more fit and flexible. Why should I exercise if I have diabetes? Exercise has many benefits for people with diabetes. It can: Help lower and control your blood sugar (glucose). Help your body respond better and become more sensitive to the hormone insulin. Reduce how much insulin your body needs. Lower your risk for heart disease by: Lowering how much "bad" cholesterol and triglycerides you have in your body. Increasing how much "good" cholesterol  you have in your body. Lowering your blood pressure. Lowering your blood glucose levels. What is my activity plan? Your health care provider or an expert trained in diabetes care (certified diabetes educator) can help you make an activity plan. This plan can help you find the type of exercise that works for you. It may also tell you how often to exercise and for how long. Be sure to: Get at least 150 minutes of medium-intensity or high-intensity exercise each week. This may involve brisk walking, biking, or water aerobics. Do stretching and strengthening exercises at least 2 times a week. This may involve yoga or weight lifting. Spread out your activity over at least 3 days of the week. Get some form of physical activity each day. Do not go more than 2 days in a row without some kind of activity. Avoid being inactive for more than 30 minutes at a time. Take frequent breaks to walk or stretch. Choose activities that you enjoy. Set goals that you know you can accomplish. Start slowly and increase the intensity of your exercise over time. How do I manage my diabetes during exercise?  Monitor your blood glucose Check your blood glucose before and after you exercise. If your blood glucose is 240 mg/dL (40.9 mmol/L) or higher before you exercise, check your urine for ketones. These are chemicals created by the liver. If you have ketones in your urine, do not exercise until your blood glucose returns to normal. If your blood glucose is 100 mg/dL (5.6 mmol/L) or lower, eat a snack that has 15-20 grams of carbohydrate in  it. Check your blood glucose 15 minutes after the snack to make sure that your level is above 100 mg/dL (5.6 mmol/L) before you start to exercise. Your risk for low blood glucose (hypoglycemia) goes up during and after exercise. Know the symptoms of this condition and how to treat it. Follow these instructions at home: Keep a carbohydrate snack on hand for use before, during, and after  exercise. This can help prevent or treat hypoglycemia. Avoid injecting insulin into parts of your body that are going to be used during exercise. This may include: Your arms, when you are going to play tennis. Your legs, when you are about to go jogging. Keep track of your exercise habits. This can help you and your health care provider watch and adjust your activity plan. Write down: What you eat before and after you exercise. Blood glucose levels before and after you exercise. The type and amount of exercise you do. Talk to your health care provider before you start a new activity. They may need to: Make sure that the activity is safe for you. Adjust your insulin, other medicines, and food that you eat. Drink water while you exercise. This can stop you from losing too much water (dehydration). It can also prevent problems caused by having a lot of heat in your body (heat stroke). Where to find more information American Diabetes Association: diabetes.org Association of Diabetes Care & Education Specialists: diabeteseducator.org This information is not intended to replace advice given to you by your health care provider. Make sure you discuss any questions you have with your health care provider. Document Revised: 10/26/2021 Document Reviewed: 10/26/2021 Elsevier Patient Education  2024 [employer].

## 2023-12-05 ENCOUNTER — Encounter: Payer: Self-pay | Admitting: Nurse Practitioner

## 2023-12-05 ENCOUNTER — Ambulatory Visit: Payer: Self-pay | Admitting: Nurse Practitioner

## 2023-12-05 VITALS — BP 131/66 | HR 61 | Temp 98.3°F | Ht 75.0 in | Wt 340.8 lb

## 2023-12-05 DIAGNOSIS — E113293 Type 2 diabetes mellitus with mild nonproliferative diabetic retinopathy without macular edema, bilateral: Secondary | ICD-10-CM | POA: Diagnosis not present

## 2023-12-05 DIAGNOSIS — Z794 Long term (current) use of insulin: Secondary | ICD-10-CM

## 2023-12-05 DIAGNOSIS — Z0000 Encounter for general adult medical examination without abnormal findings: Secondary | ICD-10-CM | POA: Diagnosis not present

## 2023-12-05 DIAGNOSIS — E890 Postprocedural hypothyroidism: Secondary | ICD-10-CM

## 2023-12-05 DIAGNOSIS — G894 Chronic pain syndrome: Secondary | ICD-10-CM

## 2023-12-05 DIAGNOSIS — E1122 Type 2 diabetes mellitus with diabetic chronic kidney disease: Secondary | ICD-10-CM | POA: Diagnosis not present

## 2023-12-05 DIAGNOSIS — I152 Hypertension secondary to endocrine disorders: Secondary | ICD-10-CM | POA: Diagnosis not present

## 2023-12-05 DIAGNOSIS — Z6841 Body Mass Index (BMI) 40.0 and over, adult: Secondary | ICD-10-CM

## 2023-12-05 DIAGNOSIS — Z8546 Personal history of malignant neoplasm of prostate: Secondary | ICD-10-CM

## 2023-12-05 DIAGNOSIS — G4733 Obstructive sleep apnea (adult) (pediatric): Secondary | ICD-10-CM

## 2023-12-05 DIAGNOSIS — E1129 Type 2 diabetes mellitus with other diabetic kidney complication: Secondary | ICD-10-CM | POA: Diagnosis not present

## 2023-12-05 DIAGNOSIS — Z9641 Presence of insulin pump (external) (internal): Secondary | ICD-10-CM

## 2023-12-05 DIAGNOSIS — N183 Chronic kidney disease, stage 3 unspecified: Secondary | ICD-10-CM

## 2023-12-05 DIAGNOSIS — R809 Proteinuria, unspecified: Secondary | ICD-10-CM | POA: Diagnosis not present

## 2023-12-05 DIAGNOSIS — E1169 Type 2 diabetes mellitus with other specified complication: Secondary | ICD-10-CM

## 2023-12-05 DIAGNOSIS — I700 Atherosclerosis of aorta: Secondary | ICD-10-CM | POA: Diagnosis not present

## 2023-12-05 DIAGNOSIS — E1159 Type 2 diabetes mellitus with other circulatory complications: Secondary | ICD-10-CM

## 2023-12-05 LAB — HEMOGLOBIN A1C: A1c: 6.8

## 2023-12-05 LAB — MICROALBUMIN, URINE WAIVED
Creatinine, Urine Waived: 100 mg/dL (ref 10–300)
Microalb, Ur Waived: 30 mg/L — ABNORMAL HIGH (ref 0–19)
Microalb/Creat Ratio: <30 mg/g (ref <30)

## 2023-12-05 MED ORDER — GABAPENTIN 600 MG PO TABS: 600.0000 mg | ORAL_TABLET | Freq: Every evening | ORAL | 4 refills | Status: AC

## 2023-12-05 MED ORDER — GABAPENTIN 600 MG PO TABS: 600.0000 mg | ORAL_TABLET | Freq: Every evening | ORAL | 4 refills | Status: DC

## 2023-12-05 MED ORDER — HYDROCHLOROTHIAZIDE 25 MG PO TABS: 25.0000 mg | ORAL_TABLET | Freq: Every day | ORAL | 4 refills | Status: DC

## 2023-12-05 MED ORDER — TAMSULOSIN HCL 0.4 MG PO CAPS: 0.4000 mg | ORAL_CAPSULE | Freq: Every day | ORAL | 4 refills | Status: AC

## 2023-12-05 MED ORDER — LOSARTAN POTASSIUM 100 MG PO TABS: 100.0000 mg | ORAL_TABLET | Freq: Every day | ORAL | 4 refills | Status: AC

## 2023-12-05 MED ORDER — TAMSULOSIN HCL 0.4 MG PO CAPS: 0.4000 mg | ORAL_CAPSULE | Freq: Every day | ORAL | 4 refills | Status: DC

## 2023-12-05 MED ORDER — LOSARTAN POTASSIUM 100 MG PO TABS: 100.0000 mg | ORAL_TABLET | Freq: Every day | ORAL | 4 refills | Status: DC

## 2023-12-05 MED ORDER — CARVEDILOL 25 MG PO TABS: ORAL_TABLET | ORAL | 4 refills | Status: AC

## 2023-12-05 MED ORDER — AMLODIPINE BESYLATE 10 MG PO TABS
10.0000 mg | ORAL_TABLET | Freq: Every day | ORAL | 4 refills | Status: DC
Start: 2023-12-05 — End: 2023-12-05

## 2023-12-05 MED ORDER — ALLOPURINOL 300 MG PO TABS: 300.0000 mg | ORAL_TABLET | Freq: Every day | ORAL | 4 refills | Status: DC

## 2023-12-05 MED ORDER — ATORVASTATIN CALCIUM 20 MG PO TABS
20.0000 mg | ORAL_TABLET | Freq: Every day | ORAL | 4 refills | Status: DC
Start: 2023-12-05 — End: 2023-12-05

## 2023-12-05 MED ORDER — ALLOPURINOL 300 MG PO TABS: 300.0000 mg | ORAL_TABLET | Freq: Every day | ORAL | 4 refills | Status: AC

## 2023-12-05 MED ORDER — CARVEDILOL 25 MG PO TABS
ORAL_TABLET | ORAL | 4 refills | Status: DC
Start: 2023-12-05 — End: 2023-12-05

## 2023-12-05 MED ORDER — HYDROCHLOROTHIAZIDE 25 MG PO TABS: 25.0000 mg | ORAL_TABLET | Freq: Every day | ORAL | 4 refills | Status: AC

## 2023-12-05 MED ORDER — AMLODIPINE BESYLATE 10 MG PO TABS: 10.0000 mg | ORAL_TABLET | Freq: Every day | ORAL | 4 refills | Status: AC

## 2023-12-05 MED ORDER — ATORVASTATIN CALCIUM 20 MG PO TABS: 20.0000 mg | ORAL_TABLET | Freq: Every day | ORAL | 4 refills | Status: AC

## 2023-12-05 NOTE — Progress Notes (Signed)
 BP 131/66 (BP Location: Left Arm, Cuff Size: Large)   Pulse 61   Temp 98.3 F (36.8 C) (Oral)   Ht 6' 3 (1.905 m)   Wt (!) 340 lb 12.8 oz (154.6 kg)   SpO2 98%   BMI 42.60 kg/m    Subjective:    Patient ID: Aaron [name], male    DOB: 09/15/1950, 73 y.o.   MRN: [id number]  HPI: Aaron [name] is a 73 y.o. male presenting on 12/05/2023 for comprehensive medical examination. Current medical complaints include:none  He currently lives with: wife and grandchildren  Interim Problems from his last visit: no  DIABETES Follows with Dr. Damian, last visit 11/08/23, A1c at time 6.8%.  Taking Gabapentin  600 MG for chronic pain to neck area. Did physical therapy and saw pain clinic in the past. No history of injections. Seeing neurosurgery tomorrow to discuss options.   Uses Tandem T:slim insulin  pump (04/2020 placed). Per endo last visit: Basal rates Correction factor Carb ratio Target BG 12 am 2.45 units/hr 20 6.6 110 4 am 1.85 units/hr 20 6.6 110 8:30 am 1.3 units/hr 20 6.6 110 11 am 1.5 units.hr 20 5.6 110 8 pm 2.2 units/hr 20 5.6 110  24-hr basal = 43.175 units Humalog U200 insulin  (equals 86.35 units of U100 insulin )  Hypoglycemic episodes:no Polydipsia/polyuria: no Visual disturbance: no Chest pain: no Paresthesias: no Glucose Monitoring: no  Accucheck frequency: CGM -- uses Dexcom  Fasting glucose: 110 to 140  Post prandial:  Evening:  Before meals: Taking Insulin ?: yes -- insulin  pump  Long acting insulin :  Short acting insulin : Blood Pressure Monitoring: daily Retinal Examination: Not Up to Date - changed insurance Foot Exam: Up To Date Pneumovax: Up to Date Influenza: Up to Date Aspirin : yes   HYPERTENSION / HYPERLIPIDEMIA Continues on Losartan  100 MG, HCTZ 25 MG daily, Carvedilol  25 MG BID, Amlodipine  10 MG daily, Atorvastatin  20 MG daily. History on 09/16/19 imaging aortic atherosclerosis noted.   Uses CPAP for OSA every night.  Is in need of whole  machine, reports his current one states it has reached its limit.  Needs authorization.  Current setting is on 10. History of nodules noted on CT 02/04/23. Was to repeat in 3 months, but continues to decline due to other current health issues going on. Satisfied with current treatment? yes Duration of hypertension: years BP monitoring frequency: not checking BP range:  BP medication side effects: no Duration of hyperlipidemia: years Cholesterol medication side effects: no Cholesterol supplements: none Medication compliance: excellent compliance Aspirin : yes Recent stressors: no Recurrent headaches: occasional due to neck Visual changes: no Palpitations: no Dyspnea: no Chest pain: no Lower extremity edema: no Dizzy/lightheaded: no   CHRONIC KIDNEY DISEASE Last eGFR 59 with endo.  History of prostate cancer with removal years ago and stable PSA ongoing. CKD status: stable Medications renally dose: no Previous renal evaluation: no Pneumovax:  Up to Date Influenza Vaccine:  Up to Date   GOUT Continues on Allopurinol . Has not had a flare in many years. Duration:chronic Swelling: no Redness: no Trauma: no Recent dietary change or indiscretion: no Fevers: no Nausea/vomiting: no Aggravating factors: Alleviating factors:  Status:  better Treatments attempted:   Functional Status Survey: Is the patient deaf or have difficulty hearing?: No Does the patient have difficulty seeing, even when wearing glasses/contacts?: No Does the patient have difficulty concentrating, remembering, or making decisions?: No Does the patient have difficulty walking or climbing stairs?: No Does the patient have difficulty dressing or  bathing?: No Does the patient have difficulty doing errands alone such as visiting a doctor's office or shopping?: No     05/26/2022    9:03 AM 11/27/2022    8:50 AM 02/05/2023    1:17 PM 04/17/2023    2:25 PM 05/30/2023    8:37 AM  Fall Risk  Falls in the past year? 0 0  0 0 0  Was there an injury with Fall? 0 0 0 0 0  Fall Risk Category Calculator 0 0 0 0 0  Fall Risk Category (Retired) Low       Patient at Risk for Falls Due to  No Fall Risks No Fall Risks No Fall Risks No Fall Risks  Fall risk Follow up Falls evaluation completed  Falls evaluation completed Falls evaluation completed Falls prevention discussed Falls evaluation completed     Data saved with a previous flowsheet row definition       12/05/2023    8:08 AM 05/30/2023    8:38 AM 04/17/2023    2:23 PM 02/05/2023    1:17 PM 11/27/2022    8:50 AM  Depression screen PHQ 2/9  Decreased Interest 0 0 0 0 0  Down, Depressed, Hopeless 0 0 0 0 0  PHQ - 2 Score 0 0 0 0 0  Altered sleeping 0 0 0 0 0  Tired, decreased energy 0 0 0 0 0  Change in appetite 0 0 0 0 0  Feeling bad or failure about yourself  0 0 0 0 0  Trouble concentrating 0 0 0 0 0  Moving slowly or fidgety/restless 0 0 0 0 0  Suicidal thoughts 0 0 0 0 0  PHQ-9 Score 0 0 0 0 0  Difficult doing work/chores Not difficult at all Not difficult at all Not difficult at all Not difficult at all Not difficult at all       12/05/2023    8:09 AM 05/30/2023    8:39 AM 02/05/2023    1:18 PM 11/27/2022    8:50 AM  GAD 7 : Generalized Anxiety Score  Nervous, Anxious, on Edge 0 0 0 0  Control/stop worrying 0 0 0 0  Worry too much - different things 0 0 0 0  Trouble relaxing 0 0 0 0  Restless 0 0 0 0  Easily annoyed or irritable 0 0 0 0  Afraid - awful might happen 0 0 0 0  Total GAD 7 Score 0 0 0 0  Anxiety Difficulty Not difficult at all Not difficult at all Not difficult at all Not difficult at all     Advanced Directives <no information>  Past Medical History:  Past Medical History:  Diagnosis Date   Cancer (HCC) 2013   prostate (radiation and surgery)   Diabetes mellitus without complication (HCC)    Gout    Hyperlipidemia    Hypertension    Sleep apnea    Spinal stenosis in cervical region 06/03/2020   Surgical History:  Past  Surgical History:  Procedure Laterality Date   CIRCUMCISION     COLONOSCOPY N/A 06/22/2021   Procedure: COLONOSCOPY;  Surgeon: Toledo, Ladell POUR, MD;  Location: [facility] ENDOSCOPY;  Service: Gastroenterology;  Laterality: N/A;  IDDM   CYST EXCISION Left 2015   arm   deviated septrum  2003   JOINT REPLACEMENT Left 2016   Hip   PROSTATE SURGERY     prostatectomy due to cancer   PROSTATECTOMY  2013   TONSILLECTOMY AND ADENOIDECTOMY  2004  TOTAL HIP ARTHROPLASTY Left 2016   TOTAL HIP ARTHROPLASTY Right 11/27/2017   Procedure: TOTAL HIP ARTHROPLASTY ANTERIOR APPROACH;  Surgeon: Kathlynn Sharper, MD;  Location: [facility] ORS;  Service: Orthopedics;  Laterality: Right;    Medications:  Current Outpatient Medications on File Prior to Visit  Medication Sig   ACCU-CHEK GUIDE test strip    ASPIRIN  81 PO every evening.    Cholecalciferol 1.25 MG (50000 UT) capsule Take 1 capsule by mouth once a week.   Insulin  Human (INSULIN  PUMP) SOLN Inject into the skin.   insulin  lispro (HUMALOG KWIKPEN) 200 UNIT/ML KwikPen Inject 75 Units into the skin 2 (two) times daily.   No current facility-administered medications on file prior to visit.    Allergies:  Allergies  Allergen Reactions   Glucophage [Metformin] Diarrhea    Social History:  Social History   Socioeconomic History   Marital status: Married    Spouse name: Elveria   Number of children: 2   Years of education: Not on file   Highest education level: Some college, no degree  Occupational History   Occupation: retired  Tobacco Use   Smoking status: Never   Smokeless tobacco: Never  Vaping Use   Vaping status: Never Used  Substance and Sexual Activity   Alcohol use: No   Drug use: No   Sexual activity: Yes  Other Topics Concern   Not on file  Social History Narrative   Not on file   Social Drivers of Health   Financial Resource Strain: Low Risk  (04/17/2023)   Overall Financial Resource Strain (CARDIA)    Difficulty of Paying  Living Expenses: Not hard at all  Food Insecurity: No Food Insecurity (04/17/2023)   Hunger Vital Sign    Worried About Running Out of Food in the Last Year: Never true    Ran Out of Food in the Last Year: Never true  Transportation Needs: No Transportation Needs (04/17/2023)   PRAPARE - Administrator, Civil Service (Medical): No    Lack of Transportation (Non-Medical): No  Physical Activity: Inactive (04/17/2023)   Exercise Vital Sign    Days of Exercise per Week: 0 days    Minutes of Exercise per Session: 0 min  Stress: No Stress Concern Present (04/17/2023)   [employer] of Occupational Health - Occupational Stress Questionnaire    Feeling of Stress : Not at all  Social Connections: Moderately Isolated (04/17/2023)   Social Connection and Isolation Panel    Frequency of Communication with Friends and Family: More than three times a week    Frequency of Social Gatherings with Friends and Family: Once a week    Attends Religious Services: Never    Database administrator or Organizations: No    Attends Banker Meetings: Never    Marital Status: Married  Catering manager Violence: Not At Risk (04/17/2023)   Humiliation, Afraid, Rape, and Kick questionnaire    Fear of Current or Ex-Partner: No    Emotionally Abused: No    Physically Abused: No    Sexually Abused: No   Social History   Tobacco Use  Smoking Status Never  Smokeless Tobacco Never   Social History   Substance and Sexual Activity  Alcohol Use No    Family History:  Family History  Problem Relation Age of Onset   Cancer Mother    Breast cancer Mother    Stroke Father    Cancer Sister    Cancer Maternal Uncle  Stroke Maternal Uncle     Past medical history, surgical history, medications, allergies, family history and social history reviewed with patient today and changes made to appropriate areas of the chart.   ROS All other ROS negative except what is listed above and  in the HPI.      Objective:    BP 131/66 (BP Location: Left Arm, Cuff Size: Large)   Pulse 61   Temp 98.3 F (36.8 C) (Oral)   Ht 6' 3 (1.905 m)   Wt (!) 340 lb 12.8 oz (154.6 kg)   SpO2 98%   BMI 42.60 kg/m   Wt Readings from Last 3 Encounters:  12/05/23 (!) 340 lb 12.8 oz (154.6 kg)  05/30/23 (!) 329 lb 3.2 oz (149.3 kg)  04/17/23 (!) 325 lb (147.4 kg)     Physical Exam Vitals and nursing note reviewed.  Constitutional:      General: He is awake. He is not in acute distress.    Appearance: He is well-developed and well-groomed. He is obese. He is not ill-appearing or toxic-appearing.  HENT:     Head: Normocephalic and atraumatic.     Right Ear: Hearing, tympanic membrane, ear canal and external ear normal. No drainage.     Left Ear: Hearing, tympanic membrane, ear canal and external ear normal. No drainage.     Nose: Nose normal.     Mouth/Throat:     Pharynx: Uvula midline.  Eyes:     General: Lids are normal.        Right eye: No discharge.        Left eye: No discharge.     Extraocular Movements: Extraocular movements intact.     Conjunctiva/sclera: Conjunctivae normal.     Pupils: Pupils are equal, round, and reactive to light.     Visual Fields: Right eye visual fields normal and left eye visual fields normal.  Neck:     Thyroid : No thyromegaly.     Vascular: No carotid bruit or JVD.     Trachea: Trachea normal.  Cardiovascular:     Rate and Rhythm: Normal rate and regular rhythm.     Heart sounds: Normal heart sounds, S1 normal and S2 normal. No murmur heard.    No gallop.  Pulmonary:     Effort: Pulmonary effort is normal. No accessory muscle usage or respiratory distress.     Breath sounds: Normal breath sounds.  Abdominal:     General: Bowel sounds are normal.     Palpations: Abdomen is soft. There is no hepatomegaly or splenomegaly.     Tenderness: There is no abdominal tenderness.  Musculoskeletal:        General: Normal range of motion.      Cervical back: Normal range of motion and neck supple.     Right lower leg: No edema.     Left lower leg: No edema.  Lymphadenopathy:     Head:     Right side of head: No submental, submandibular, tonsillar, preauricular or posterior auricular adenopathy.     Left side of head: No submental, submandibular, tonsillar, preauricular or posterior auricular adenopathy.     Cervical: No cervical adenopathy.  Skin:    General: Skin is warm and dry.     Capillary Refill: Capillary refill takes less than 2 seconds.     Findings: No rash.  Neurological:     Mental Status: He is alert and oriented to person, place, and time.     Gait: Gait is  intact.     Deep Tendon Reflexes: Reflexes are normal and symmetric.     Reflex Scores:      Brachioradialis reflexes are 2+ on the right side and 2+ on the left side.      Patellar reflexes are 2+ on the right side and 2+ on the left side. Psychiatric:        Attention and Perception: Attention normal.        Mood and Affect: Mood normal.        Speech: Speech normal.        Behavior: Behavior normal. Behavior is cooperative.        Thought Content: Thought content normal.        Cognition and Memory: Cognition normal.    Diabetic Foot Exam - Simple   Simple Foot Form Visual Inspection See comments: Yes Sensation Testing See comments: Yes Pulse Check Posterior Tibialis and Dorsalis pulse intact bilaterally: Yes Comments Dry feet, thick toenails.  Sensation: left 8/10, right 8/10.       04/17/2023    2:26 PM 02/14/2022    9:10 AM 02/13/2021   10:39 AM 10/23/2018    9:51 AM  6CIT Screen  What Year? 0 points 0 points 0 points 0 points  What month? 0 points 0 points 0 points 0 points  What time? 0 points 0 points 0 points 0 points  Count back from 20 0 points  0 points 0 points  Months in reverse 0 points 0 points 0 points 0 points  Repeat phrase 0 points 0 points 0 points 0 points  Total Score 0 points  0 points 0 points   Results for  orders placed or performed in visit on 12/05/23  Hemoglobin A1c   Collection Time: 11/05/23 12:00 AM  Result Value Ref Range   Hemoglobin-A1c 6.8%   Hemoglobin A1c   Collection Time: 11/08/23 12:00 AM  Result Value Ref Range   A1c 6.8%       Assessment & Plan:   Problem List Items Addressed This Visit       Cardiovascular and Mediastinum   Hypertension associated with diabetes (HCC)   Chronic, stable.  BP at goal today..  Recommend he continue to check BP at home twice daily and document for provider visits. Continue current medication regimen at this time, is on max dose multiple medications.  May need to adjust in future and change HCTZ if return of gout flares or decline in kidney function.  Labs: up to date with endo.  Urine ALB <28 April 2023, maintain ARB -- recheck today.      Relevant Medications   losartan  (COZAAR ) 100 MG tablet   hydrochlorothiazide  (HYDRODIURIL ) 25 MG tablet   carvedilol  (COREG ) 25 MG tablet   atorvastatin  (LIPITOR) 20 MG tablet   amLODipine  (NORVASC ) 10 MG tablet   Other Relevant Orders   Microalbumin, Urine Waived   CBC with Differential/Platelet   TSH   Aortic atherosclerosis (HCC)   Chronic.  Noted on 09/16/19 imaging.  Continue daily statin and ASA for prevention + focus on healthy diet and modest weight loss.      Relevant Medications   losartan  (COZAAR ) 100 MG tablet   hydrochlorothiazide  (HYDRODIURIL ) 25 MG tablet   carvedilol  (COREG ) 25 MG tablet   atorvastatin  (LIPITOR) 20 MG tablet   amLODipine  (NORVASC ) 10 MG tablet     Respiratory   Sleep apnea   Nodule of middle lobe of right lung   Noted on CT imaging  in ER on 02/04/23, right middle lobe nodule 16 x 10 mm.  Recommendation for repeat CT in 3 months be considered.  Discussed with patient.  He wishes to hold off on this at this time.        Endocrine   Type 2 diabetes mellitus with proteinuria (HCC)   Chronic, ongoing, followed by endocrinology with insulin  pump in place.   Continue current collaboration and pump settings per endo -- recent note reviewed, will defer pump changes to them.  A1c 6.8% with them -- urine ALB < 28 April 2023, continue Losartan  for kidney protection.  Recommend focus on diet and modest weight loss.  Does endorse poor diet on occasion.  Recommend he continue to monitor BS closely at home and if any consistent <70 immediately alert endo for pump changes.  Return in 6 months. - Vaccinations up to date - ARB and statin on board - Foot exam up to date, needs to schedule eye exam.      Relevant Medications   losartan  (COZAAR ) 100 MG tablet   atorvastatin  (LIPITOR) 20 MG tablet   Other Relevant Orders   Microalbumin, Urine Waived   Type 2 diabetes mellitus with both eyes affected by mild nonproliferative retinopathy without macular edema, with long-term current use of insulin  (HCC) - Primary   Refer to diabetes with proteinuria plan.  Continue visits with ophthalmology.      Relevant Medications   losartan  (COZAAR ) 100 MG tablet   atorvastatin  (LIPITOR) 20 MG tablet   Other Relevant Orders   Microalbumin, Urine Waived   Hyperlipidemia associated with type 2 diabetes mellitus (HCC)   Chronic, ongoing.  Continue current medication regimen and adjust as needed.  Labs up to date with endo.       Relevant Medications   losartan  (COZAAR ) 100 MG tablet   hydrochlorothiazide  (HYDRODIURIL ) 25 MG tablet   carvedilol  (COREG ) 25 MG tablet   atorvastatin  (LIPITOR) 20 MG tablet   amLODipine  (NORVASC ) 10 MG tablet   CKD stage 3 due to type 2 diabetes mellitus (HCC)   Has been stable for 2 years, will continue to monitor and if remains stable with discontinue this diagnosis.  Urine ALB <28 April 2023 with endo, maintain ARB on board.  Will recheck today.      Relevant Medications   losartan  (COZAAR ) 100 MG tablet   atorvastatin  (LIPITOR) 20 MG tablet   Other Relevant Orders   Microalbumin, Urine Waived     Other   Morbid obesity (HCC)    BMI 42.60 with T2DM, HTN.  Recommended eating smaller high protein, low fat meals more frequently and exercising 30 mins a day 5 times a week with a goal of 10-15lb weight loss in the next 3 months. Patient voiced their understanding and motivation to adhere to these recommendations.       History of thyroidectomy, subtotal   On 06/04/23.  Monitor labs annually.      History of prostate cancer   Continue to collaborate with urology as needed.  PSA obtained today.      Relevant Orders   PSA   Chronic pain syndrome   Chronic, ongoing to cervical spine.  Continue collaboration with pain management and neurosurgery on as needed basis, notes reviewed.  Gabapentin  refills up to date.      Relevant Medications   gabapentin  (NEURONTIN ) 600 MG tablet   Other Relevant Orders   Uric acid   BMI 40.0-44.9, adult (HCC)   Recommend focus on modest weight  loss with diet changes and regular exercise regimen.        Other Visit Diagnoses       Encounter for annual physical exam       Annual physical today, health maintenance reviewed with patient.       Discussed aspirin  prophylaxis for myocardial infarction prevention and decision was made to continue ASA  LABORATORY TESTING:  Health maintenance labs ordered today as discussed above.   The natural history of prostate cancer and ongoing controversy regarding screening and potential treatment outcomes of prostate cancer has been discussed with the patient. The meaning of a false positive PSA and a false negative PSA has been discussed. He indicates understanding of the limitations of this screening test and wishes to proceed with screening PSA testing.   IMMUNIZATIONS:   - Tdap: Tetanus vaccination status reviewed: Td vaccination indicated and given today. - Influenza: Up to date - Pneumovax: Up to date - Prevnar: Up to date - Zostavax vaccine: Plans on getting these  SCREENING: - Colonoscopy: Up to date  Discussed with patient  purpose of the colonoscopy is to detect colon cancer at curable precancerous or early stages   - AAA Screening: Not Applicable, never a smoker -Hearing Test: Not applicable  -Spirometry: Not applicable   PATIENT COUNSELING:    Sexuality: Discussed sexually transmitted diseases, partner selection, use of condoms, avoidance of unintended pregnancy  and contraceptive alternatives.   Advised to avoid cigarette smoking.  I discussed with the patient that most people either abstain from alcohol or drink within safe limits (<=14/week and <=4 drinks/occasion for males, <=7/weeks and <= 3 drinks/occasion for females) and that the risk for alcohol disorders and other health effects rises proportionally with the number of drinks per week and how often a drinker exceeds daily limits.  Discussed cessation/primary prevention of drug use and availability of treatment for abuse.   Diet: Encouraged to adjust caloric intake to maintain  or achieve ideal body weight, to reduce intake of dietary saturated fat and total fat, to limit sodium intake by avoiding high sodium foods and not adding table salt, and to maintain adequate dietary potassium and calcium  preferably from fresh fruits, vegetables, and low-fat dairy products.    Stressed the importance of regular exercise  Injury prevention: Discussed safety belts, safety helmets, smoke detector, smoking near bedding or upholstery.   Dental health: Discussed importance of regular tooth brushing, flossing, and dental visits.   Follow up plan: NEXT PREVENTATIVE PHYSICAL DUE IN 1 YEAR. Return in about 6 months (around 06/06/2024) for T2DM, HTN/HLD, CKD.

## 2023-12-05 NOTE — Assessment & Plan Note (Signed)
 Chronic, ongoing, followed by endocrinology with insulin  pump in place.  Continue current collaboration and pump settings per endo -- recent note reviewed, will defer pump changes to them.  A1c 6.8% with them -- urine ALB < 28 April 2023, continue Losartan  for kidney protection.  Recommend focus on diet and modest weight loss.  Does endorse poor diet on occasion.  Recommend he continue to monitor BS closely at home and if any consistent <70 immediately alert endo for pump changes.  Return in 6 months. - Vaccinations up to date - ARB and statin on board - Foot exam up to date, needs to schedule eye exam.

## 2023-12-05 NOTE — Assessment & Plan Note (Signed)
Recommend focus on modest weight loss with diet changes and regular exercise regimen.   

## 2023-12-05 NOTE — Assessment & Plan Note (Signed)
Continue to collaborate with urology as needed.  PSA obtained today.

## 2023-12-05 NOTE — Assessment & Plan Note (Signed)
 Chronic, stable.  BP at goal today..  Recommend he continue to check BP at home twice daily and document for provider visits. Continue current medication regimen at this time, is on max dose multiple medications.  May need to adjust in future and change HCTZ if return of gout flares or decline in kidney function.  Labs: up to date with endo.  Urine ALB <28 April 2023, maintain ARB -- recheck today.

## 2023-12-05 NOTE — Assessment & Plan Note (Signed)
 Chronic, ongoing.  Continue current medication regimen and adjust as needed.  Labs up to date with endo.

## 2023-12-05 NOTE — Assessment & Plan Note (Signed)
 BMI 42.60 with T2DM, HTN.  Recommended eating smaller high protein, low fat meals more frequently and exercising 30 mins a day 5 times a week with a goal of 10-15lb weight loss in the next 3 months. Patient voiced their understanding and motivation to adhere to these recommendations.

## 2023-12-05 NOTE — Assessment & Plan Note (Signed)
Refer to diabetes with proteinuria plan.  Continue visits with ophthalmology.

## 2023-12-05 NOTE — Assessment & Plan Note (Signed)
 On 06/04/23.  Monitor labs annually.

## 2023-12-05 NOTE — Assessment & Plan Note (Signed)
Chronic.  Noted on 09/16/19 imaging.  Continue daily statin and ASA for prevention + focus on healthy diet and modest weight loss. 

## 2023-12-05 NOTE — Assessment & Plan Note (Signed)
Chronic, ongoing to cervical spine.  Continue collaboration with pain management and neurosurgery on as needed basis, notes reviewed.  Gabapentin refills up to date.

## 2023-12-05 NOTE — Assessment & Plan Note (Signed)
 Noted on CT imaging in ER on 02/04/23, right middle lobe nodule 16 x 10 mm.  Recommendation for repeat CT in 3 months be considered.  Discussed with patient.  He wishes to hold off on this at this time.

## 2023-12-05 NOTE — Assessment & Plan Note (Signed)
 Has been stable for 2 years, will continue to monitor and if remains stable with discontinue this diagnosis.  Urine ALB <28 April 2023 with endo, maintain ARB on board.  Will recheck today.

## 2023-12-06 ENCOUNTER — Ambulatory Visit: Payer: Self-pay | Admitting: Nurse Practitioner

## 2023-12-06 DIAGNOSIS — G959 Disease of spinal cord, unspecified: Secondary | ICD-10-CM | POA: Diagnosis not present

## 2023-12-06 DIAGNOSIS — E890 Postprocedural hypothyroidism: Secondary | ICD-10-CM

## 2023-12-06 DIAGNOSIS — M2569 Stiffness of other specified joint, not elsewhere classified: Secondary | ICD-10-CM | POA: Diagnosis not present

## 2023-12-06 LAB — CBC WITH DIFFERENTIAL/PLATELET
Basophils Absolute: 0.1 x10E3/uL (ref 0.0–0.2)
Basos: 1 %
EOS (ABSOLUTE): 0.1 x10E3/uL (ref 0.0–0.4)
Eos: 2 %
Hematocrit: 43.2 % (ref 37.5–51.0)
Hemoglobin: 13.8 g/dL (ref 13.0–17.7)
Immature Grans (Abs): 0 x10E3/uL (ref 0.0–0.1)
Immature Granulocytes: 0 %
Lymphocytes Absolute: 2.1 x10E3/uL (ref 0.7–3.1)
Lymphs: 27 %
MCH: 27.7 pg (ref 26.6–33.0)
MCHC: 31.9 g/dL (ref 31.5–35.7)
MCV: 87 fL (ref 79–97)
Monocytes Absolute: 0.6 x10E3/uL (ref 0.1–0.9)
Monocytes: 8 %
Neutrophils Absolute: 4.8 x10E3/uL (ref 1.4–7.0)
Neutrophils: 62 %
Platelets: 239 x10E3/uL (ref 150–450)
RBC: 4.98 x10E6/uL (ref 4.14–5.80)
RDW: 14.7 % (ref 11.6–15.4)
WBC: 7.7 x10E3/uL (ref 3.4–10.8)

## 2023-12-06 LAB — PSA: Prostate Specific Ag, Serum: 0.3 ng/mL (ref 0.0–4.0)

## 2023-12-06 LAB — TSH: TSH: 32.7 u[IU]/mL — ABNORMAL HIGH (ref 0.450–4.500)

## 2023-12-06 LAB — URIC ACID: Uric Acid: 4.5 mg/dL (ref 3.8–8.4)

## 2023-12-06 NOTE — Progress Notes (Signed)
 Please let patient know his labs have returned and needs 4 week lab visit: - Thyroid  levels are showing significant elevation in TSH, I would like to recheck this in 4 weeks outpatient and if elevation continues we will need to start medication as currently thyroid  is more sluggish, slow.  - Remainder of labs are all stable.  Any questions? Keep being stellar!!  Thank you for allowing me to participate in your care.  I appreciate you. Kindest regards, Kiyomi Pallo

## 2023-12-10 ENCOUNTER — Telehealth: Payer: Self-pay | Admitting: Nurse Practitioner

## 2023-12-10 NOTE — Telephone Encounter (Signed)
 The fax has been received   Copied from CRM [id number]. Topic: General - Other >> Dec 10, 2023 10:38 AM Larissa RAMAN wrote: Reason for CRM: Katie with [facility] calling to verify if a fax was received for medical clearance for patient. States she will refax documentation, fax # provided.

## 2023-12-11 ENCOUNTER — Telehealth: Payer: Self-pay

## 2023-12-11 NOTE — Telephone Encounter (Unsigned)
 Copied from CRM [id number]. Topic: Clinical - Request for Lab/Test Order >> Dec 11, 2023 11:46 AM Aaron [name] wrote: Patient called to see if lab orders could be placed for pre surgical labs. Please advise.

## 2023-12-12 NOTE — Telephone Encounter (Signed)
 Clearance form and lab requests received from [facility].   Called [facility] and notified them that we have received paperwork and will be holding it until the patient's provider returns from vacation.   Called and notified patient of the above as well. Notified patient that we will contact him if/when lab orders have been placed when his provider returns.

## 2023-12-19 ENCOUNTER — Telehealth: Payer: Self-pay

## 2023-12-19 NOTE — Telephone Encounter (Signed)
 Copied from CRM [id number]. Topic: Clinical - Order For Equipment >> Dec 19, 2023 11:28 AM Aaron [name] wrote: Reason for CRM: Patient calling to check the status of his request for a new CPAP machine and it the info was submitted to [facility].  Patient can be reached at [phone]

## 2023-12-24 ENCOUNTER — Other Ambulatory Visit: Payer: Self-pay | Admitting: Nurse Practitioner

## 2023-12-24 ENCOUNTER — Other Ambulatory Visit

## 2023-12-24 DIAGNOSIS — Z01818 Encounter for other preprocedural examination: Secondary | ICD-10-CM | POA: Diagnosis not present

## 2023-12-24 DIAGNOSIS — E890 Postprocedural hypothyroidism: Secondary | ICD-10-CM | POA: Diagnosis not present

## 2023-12-24 DIAGNOSIS — J189 Pneumonia, unspecified organism: Secondary | ICD-10-CM

## 2023-12-25 ENCOUNTER — Ambulatory Visit: Payer: Self-pay | Admitting: Nurse Practitioner

## 2023-12-25 DIAGNOSIS — E890 Postprocedural hypothyroidism: Secondary | ICD-10-CM

## 2023-12-25 LAB — BASIC METABOLIC PANEL WITH GFR
BUN/Creatinine Ratio: 14 (ref 10–24)
BUN: 16 mg/dL (ref 8–27)
CO2: 25 mmol/L (ref 20–29)
Calcium: 9.5 mg/dL (ref 8.6–10.2)
Chloride: 102 mmol/L (ref 96–106)
Creatinine, Ser: 1.16 mg/dL (ref 0.76–1.27)
Glucose: 106 mg/dL — ABNORMAL HIGH (ref 70–99)
Potassium: 4.1 mmol/L (ref 3.5–5.2)
Sodium: 142 mmol/L (ref 134–144)
eGFR: 67 mL/min/1.73 (ref >59)

## 2023-12-25 LAB — T4, FREE: Free T4: 0.68 ng/dL — ABNORMAL LOW (ref 0.82–1.77)

## 2023-12-25 LAB — PROTIME-INR
INR: 1 (ref 0.9–1.2)
Prothrombin Time: 11.1 s (ref 9.1–12.0)

## 2023-12-25 LAB — TSH: TSH: 18.7 u[IU]/mL — ABNORMAL HIGH (ref 0.450–4.500)

## 2023-12-25 MED ORDER — LEVOTHYROXINE SODIUM 25 MCG PO TABS
25.0000 ug | ORAL_TABLET | Freq: Every day | ORAL | 3 refills | Status: AC
Start: 2023-12-25 — End: ?

## 2023-12-25 NOTE — Telephone Encounter (Signed)
 See lab result note. Forms signed and faxed back to [facility]

## 2024-01-22 DIAGNOSIS — M4812 Ankylosing hyperostosis [Forestier], cervical region: Secondary | ICD-10-CM | POA: Diagnosis not present

## 2024-01-22 DIAGNOSIS — I129 Hypertensive chronic kidney disease with stage 1 through stage 4 chronic kidney disease, or unspecified chronic kidney disease: Secondary | ICD-10-CM | POA: Diagnosis not present

## 2024-01-22 DIAGNOSIS — E113293 Type 2 diabetes mellitus with mild nonproliferative diabetic retinopathy without macular edema, bilateral: Secondary | ICD-10-CM | POA: Diagnosis not present

## 2024-01-22 DIAGNOSIS — G992 Myelopathy in diseases classified elsewhere: Secondary | ICD-10-CM | POA: Diagnosis not present

## 2024-01-22 DIAGNOSIS — M488X2 Other specified spondylopathies, cervical region: Secondary | ICD-10-CM | POA: Diagnosis not present

## 2024-01-22 DIAGNOSIS — Z7982 Long term (current) use of aspirin: Secondary | ICD-10-CM | POA: Diagnosis not present

## 2024-01-22 DIAGNOSIS — E1140 Type 2 diabetes mellitus with diabetic neuropathy, unspecified: Secondary | ICD-10-CM | POA: Diagnosis not present

## 2024-01-22 DIAGNOSIS — G8929 Other chronic pain: Secondary | ICD-10-CM | POA: Diagnosis not present

## 2024-01-22 DIAGNOSIS — M5412 Radiculopathy, cervical region: Secondary | ICD-10-CM | POA: Diagnosis not present

## 2024-01-22 DIAGNOSIS — M4802 Spinal stenosis, cervical region: Secondary | ICD-10-CM | POA: Diagnosis not present

## 2024-01-22 DIAGNOSIS — E1165 Type 2 diabetes mellitus with hyperglycemia: Secondary | ICD-10-CM | POA: Diagnosis not present

## 2024-01-22 DIAGNOSIS — E46 Unspecified protein-calorie malnutrition: Secondary | ICD-10-CM | POA: Diagnosis not present

## 2024-01-22 DIAGNOSIS — M4712 Other spondylosis with myelopathy, cervical region: Secondary | ICD-10-CM | POA: Diagnosis not present

## 2024-01-22 DIAGNOSIS — Z9641 Presence of insulin pump (external) (internal): Secondary | ICD-10-CM | POA: Diagnosis not present

## 2024-01-22 DIAGNOSIS — N189 Chronic kidney disease, unspecified: Secondary | ICD-10-CM | POA: Diagnosis not present

## 2024-01-22 DIAGNOSIS — E1122 Type 2 diabetes mellitus with diabetic chronic kidney disease: Secondary | ICD-10-CM | POA: Diagnosis not present

## 2024-01-22 DIAGNOSIS — Z7952 Long term (current) use of systemic steroids: Secondary | ICD-10-CM | POA: Diagnosis not present

## 2024-01-22 DIAGNOSIS — G4733 Obstructive sleep apnea (adult) (pediatric): Secondary | ICD-10-CM | POA: Diagnosis not present

## 2024-01-22 DIAGNOSIS — Z794 Long term (current) use of insulin: Secondary | ICD-10-CM | POA: Diagnosis not present

## 2024-01-22 DIAGNOSIS — E890 Postprocedural hypothyroidism: Secondary | ICD-10-CM | POA: Diagnosis not present

## 2024-01-22 DIAGNOSIS — G959 Disease of spinal cord, unspecified: Secondary | ICD-10-CM | POA: Diagnosis not present

## 2024-01-22 DIAGNOSIS — N183 Chronic kidney disease, stage 3 unspecified: Secondary | ICD-10-CM | POA: Diagnosis not present

## 2024-01-22 DIAGNOSIS — Z79899 Other long term (current) drug therapy: Secondary | ICD-10-CM | POA: Diagnosis not present

## 2024-01-22 DIAGNOSIS — M4000 Postural kyphosis, site unspecified: Secondary | ICD-10-CM | POA: Diagnosis not present

## 2024-02-06 ENCOUNTER — Other Ambulatory Visit

## 2024-02-08 DIAGNOSIS — G959 Disease of spinal cord, unspecified: Secondary | ICD-10-CM | POA: Diagnosis not present

## 2024-02-08 DIAGNOSIS — M542 Cervicalgia: Secondary | ICD-10-CM | POA: Diagnosis not present

## 2024-02-14 DIAGNOSIS — G959 Disease of spinal cord, unspecified: Secondary | ICD-10-CM | POA: Diagnosis not present

## 2024-02-26 ENCOUNTER — Encounter: Payer: Self-pay | Admitting: Nurse Practitioner

## 2024-02-26 ENCOUNTER — Ambulatory Visit (INDEPENDENT_AMBULATORY_CARE_PROVIDER_SITE_OTHER): Admitting: Nurse Practitioner

## 2024-02-26 VITALS — BP 126/84 | HR 91 | Temp 97.7°F | Ht 74.0 in | Wt 325.8 lb

## 2024-02-26 DIAGNOSIS — Z23 Encounter for immunization: Secondary | ICD-10-CM

## 2024-02-26 DIAGNOSIS — N3001 Acute cystitis with hematuria: Secondary | ICD-10-CM | POA: Diagnosis not present

## 2024-02-26 DIAGNOSIS — R300 Dysuria: Secondary | ICD-10-CM

## 2024-02-26 LAB — URINALYSIS, ROUTINE W REFLEX MICROSCOPIC
Glucose, UA: NEGATIVE
Nitrite, UA: NEGATIVE
Specific Gravity, UA: 1.02 (ref 1.005–1.030)
Urobilinogen, Ur: 1 mg/dL (ref 0.2–1.0)
pH, UA: 6 (ref 5.0–7.5)

## 2024-02-26 LAB — MICROSCOPIC EXAMINATION
Bacteria, UA: NONE SEEN
Epithelial Cells (non renal): NONE SEEN /HPF (ref 0–10)

## 2024-02-26 MED ORDER — CIPROFLOXACIN HCL 500 MG PO TABS: 500.0000 mg | ORAL_TABLET | Freq: Two times a day (BID) | ORAL | 0 refills | Status: AC

## 2024-02-26 NOTE — Progress Notes (Signed)
 BP 126/84   Pulse 91   Temp 97.7 F (36.5 C) (Oral)   Ht 6' 2 (1.88 m)   Wt (!) 325 lb 12.8 oz (147.8 kg)   SpO2 99%   BMI 41.83 kg/m    Subjective:    Patient ID: Aaron [name], male    DOB: March 01, 1951, 73 y.o.   MRN: [id number]  NOTE WRITTEN BY DNP STUDENT.  ASSESSMENT AND PLAN OF CARE REVIEWED WITH STUDENT, AGREE WITH ABOVE FINDINGS AND PLAN.    Chief Complaint  Patient presents with   Dysuria    Onset about a week ago. Frequency, burning sensation,    HPI: Aaron [name] is a 73 y.o. male here for an acute visit for frequency, burning, hematuria for about a week. Pt denies fevers, flank pain, abdominal pain, pelvic or scrotal pain. Pt denies sexual activity and has a pmh of T2DM with proteinuria and a history of prostate cancer. Pt also endorses some mild nasal congestion, denies cough, SOB, sore throat, headaches, and reports a history of allergies. Pt would like to get flu shot today.  URINARY SYMPTOMS  Dysuria: burning Urinary frequency: yes Urgency: yes Small volume voids: yes Symptom severity: no Urinary incontinence: no Foul odor: yes Hematuria: yes Abdominal pain: no Back pain: no Suprapubic pain/pressure: no Flank pain: no Fever:  no Vomiting: no Relief with cranberry juice: yes Relief with pyridium: no Status: better/worse/stable Previous urinary tract infection: yes Recurrent urinary tract infection: no Sexual activity: No sexually active/monogomous/practicing safe sex History of sexually transmitted disease: no Penile discharge: no Treatments attempted: cranberry   Relevant past medical, surgical, family and social history reviewed and updated as indicated. Interim medical history since our last visit reviewed. Allergies and medications reviewed and updated.  Review of Systems  Constitutional:  Negative for chills, fatigue and fever.  HENT:  Positive for congestion and rhinorrhea. Negative for postnasal drip, sinus pressure, sinus pain  and sore throat.   Eyes:  Negative for discharge and itching.  Respiratory:  Negative for cough, chest tightness, shortness of breath and wheezing.   Cardiovascular:  Negative for chest pain and palpitations.  Gastrointestinal:  Negative for abdominal pain, nausea and vomiting.  Genitourinary:  Positive for dysuria, frequency, hematuria and urgency. Negative for flank pain, penile discharge, scrotal swelling and testicular pain.  Musculoskeletal:  Negative for arthralgias and myalgias.  Neurological:  Negative for weakness, light-headedness and headaches.    Per HPI unless specifically indicated above     Objective:    BP 126/84   Pulse 91   Temp 97.7 F (36.5 C) (Oral)   Ht 6' 2 (1.88 m)   Wt (!) 325 lb 12.8 oz (147.8 kg)   SpO2 99%   BMI 41.83 kg/m   Wt Readings from Last 3 Encounters:  02/26/24 (!) 325 lb 12.8 oz (147.8 kg)  12/05/23 (!) 340 lb 12.8 oz (154.6 kg)  05/30/23 (!) 329 lb 3.2 oz (149.3 kg)    Physical Exam Vitals and nursing note reviewed.  Constitutional:      General: He is not in acute distress.    Appearance: He is not ill-appearing, toxic-appearing or diaphoretic.  HENT:     Head: Normocephalic.     Right Ear: External ear normal.     Left Ear: External ear normal.     Nose: Congestion and rhinorrhea present.     Mouth/Throat:     Mouth: Mucous membranes are moist.     Pharynx: Oropharynx is clear. No  oropharyngeal exudate or posterior oropharyngeal erythema.  Eyes:     General:        Right eye: No discharge.        Left eye: No discharge.     Extraocular Movements: Extraocular movements intact.     Conjunctiva/sclera: Conjunctivae normal.     Pupils: Pupils are equal, round, and reactive to light.  Cardiovascular:     Rate and Rhythm: Normal rate and regular rhythm.     Pulses: Normal pulses.     Heart sounds: Normal heart sounds. No murmur heard. Pulmonary:     Effort: Pulmonary effort is normal. No respiratory distress.     Breath  sounds: Normal breath sounds. No wheezing, rhonchi or rales.  Abdominal:     General: Abdomen is flat. Bowel sounds are normal.     Palpations: Abdomen is soft.     Tenderness: There is no abdominal tenderness. There is no right CVA tenderness, left CVA tenderness, guarding or rebound.  Musculoskeletal:     Cervical back: Normal range of motion and neck supple.  Skin:    General: Skin is warm and dry.     Capillary Refill: Capillary refill takes less than 2 seconds.  Neurological:     General: No focal deficit present.     Mental Status: He is oriented to person, place, and time.  Psychiatric:        Mood and Affect: Mood normal.        Behavior: Behavior normal.        Thought Content: Thought content normal.        Judgment: Judgment normal.     Results for orders placed or performed in visit on 12/24/23  Basic Metabolic Panel (BMET)   Collection Time: 12/24/23 10:25 AM  Result Value Ref Range   Glucose 106 (H) 70 - 99 mg/dL   BUN 16 8 - 27 mg/dL   Creatinine, Ser 8.83 0.76 - 1.27 mg/dL   eGFR 67 >40 fO/fpw/8.26   BUN/Creatinine Ratio 14 10 - 24   Sodium 142 134 - 144 mmol/L   Potassium 4.1 3.5 - 5.2 mmol/L   Chloride 102 96 - 106 mmol/L   CO2 25 20 - 29 mmol/L   Calcium  9.5 8.6 - 10.2 mg/dL  INR/PT   Collection Time: 12/24/23 10:25 AM  Result Value Ref Range   INR 1.0 0.9 - 1.2   Prothrombin Time 11.1 9.1 - 12.0 sec  TSH   Collection Time: 12/24/23 10:25 AM  Result Value Ref Range   TSH 18.700 (H) 0.450 - 4.500 uIU/mL  T4, free   Collection Time: 12/24/23 10:25 AM  Result Value Ref Range   Free T4 0.68 (L) 0.82 - 1.77 ng/dL      Assessment & Plan:   Problem List Items Addressed This Visit   None Visit Diagnoses       Acute cystitis with hematuria    -  Primary   Culture pending. Cipro  500mg  BID for 7 days ordered. Follow-up as needed if symptoms worsen or do not resolve.   Relevant Orders   Urine Culture     Dysuria       UA pending. Cipro  500mg  BID for  7 days ordered. Follow-up as needed if symptoms worsen or do not resolve.   Relevant Orders   Urinalysis, Routine w reflex microscopic     Need for influenza vaccination       Relevant Orders   Flu vaccine HIGH DOSE PF(Fluzone Trivalent) (  Completed)        Follow up plan: No follow-ups on file.

## 2024-03-03 ENCOUNTER — Ambulatory Visit: Payer: Self-pay | Admitting: Nurse Practitioner

## 2024-03-03 LAB — URINE CULTURE

## 2024-03-05 ENCOUNTER — Encounter: Payer: Self-pay | Admitting: Nurse Practitioner

## 2024-03-06 NOTE — Telephone Encounter (Signed)
 Scheduled

## 2024-03-18 ENCOUNTER — Other Ambulatory Visit: Payer: Self-pay

## 2024-03-18 ENCOUNTER — Emergency Department
Admission: EM | Admit: 2024-03-18 | Discharge: 2024-03-19 | Disposition: A | Discharge status: Home / Self Care | Attending: Emergency Medicine | Admitting: Emergency Medicine

## 2024-03-18 ENCOUNTER — Encounter: Payer: Self-pay | Admitting: Emergency Medicine

## 2024-03-18 DIAGNOSIS — E876 Hypokalemia: Secondary | ICD-10-CM | POA: Diagnosis not present

## 2024-03-18 DIAGNOSIS — R310 Gross hematuria: Secondary | ICD-10-CM | POA: Diagnosis not present

## 2024-03-18 DIAGNOSIS — N189 Chronic kidney disease, unspecified: Secondary | ICD-10-CM | POA: Insufficient documentation

## 2024-03-18 DIAGNOSIS — I129 Hypertensive chronic kidney disease with stage 1 through stage 4 chronic kidney disease, or unspecified chronic kidney disease: Secondary | ICD-10-CM | POA: Diagnosis not present

## 2024-03-18 DIAGNOSIS — R339 Retention of urine, unspecified: Secondary | ICD-10-CM | POA: Insufficient documentation

## 2024-03-18 DIAGNOSIS — D72829 Elevated white blood cell count, unspecified: Secondary | ICD-10-CM | POA: Insufficient documentation

## 2024-03-18 DIAGNOSIS — R319 Hematuria, unspecified: Secondary | ICD-10-CM | POA: Diagnosis present

## 2024-03-18 DIAGNOSIS — E1122 Type 2 diabetes mellitus with diabetic chronic kidney disease: Secondary | ICD-10-CM | POA: Insufficient documentation

## 2024-03-18 LAB — URINALYSIS, ROUTINE W REFLEX MICROSCOPIC
Bacteria, UA: NONE SEEN
RBC / HPF: >50 RBC/hpf (ref 0–5)
Squamous Epithelial / HPF: 0 /HPF (ref 0–5)
WBC, UA: >50 WBC/hpf (ref 0–5)

## 2024-03-18 LAB — COMPREHENSIVE METABOLIC PANEL WITH GFR
ALT: 12 U/L (ref 0–44)
AST: 17 U/L (ref 15–41)
Albumin: 4 g/dL (ref 3.5–5.0)
Alkaline Phosphatase: 129 U/L — ABNORMAL HIGH (ref 38–126)
Anion gap: 14 (ref 5–15)
BUN: 19 mg/dL (ref 8–23)
CO2: 24 mmol/L (ref 22–32)
Calcium: 9.3 mg/dL (ref 8.9–10.3)
Chloride: 102 mmol/L (ref 98–111)
Creatinine, Ser: 0.91 mg/dL (ref 0.61–1.24)
GFR, Estimated: >60 mL/min (ref >60)
Glucose, Bld: 88 mg/dL (ref 70–99)
Potassium: 3.2 mmol/L — ABNORMAL LOW (ref 3.5–5.1)
Sodium: 140 mmol/L (ref 135–145)
Total Bilirubin: 0.7 mg/dL (ref 0.0–1.2)
Total Protein: 8.1 g/dL (ref 6.5–8.1)

## 2024-03-18 LAB — CBC WITH DIFFERENTIAL/PLATELET
Abs Immature Granulocytes: 0.03 K/uL (ref 0.00–0.07)
Basophils Absolute: 0.1 K/uL (ref 0.0–0.1)
Basophils Relative: 1 %
Eosinophils Absolute: 0.4 K/uL (ref 0.0–0.5)
Eosinophils Relative: 3 %
HCT: 38.3 % — ABNORMAL LOW (ref 39.0–52.0)
Hemoglobin: 12.7 g/dL — ABNORMAL LOW (ref 13.0–17.0)
Immature Granulocytes: 0 %
Lymphocytes Relative: 29 %
Lymphs Abs: 3.1 K/uL (ref 0.7–4.0)
MCH: 27 pg (ref 26.0–34.0)
MCHC: 33.2 g/dL (ref 30.0–36.0)
MCV: 81.5 fL (ref 80.0–100.0)
Monocytes Absolute: 0.8 K/uL (ref 0.1–1.0)
Monocytes Relative: 8 %
Neutro Abs: 6.4 K/uL (ref 1.7–7.7)
Neutrophils Relative %: 59 %
Platelets: 267 K/uL (ref 150–400)
RBC: 4.7 MIL/uL (ref 4.22–5.81)
RDW: 16.1 % — ABNORMAL HIGH (ref 11.5–15.5)
WBC: 10.7 K/uL — ABNORMAL HIGH (ref 4.0–10.5)
nRBC: 0 % (ref 0.0–0.2)

## 2024-03-18 MED ORDER — CEPHALEXIN 500 MG PO CAPS: 500.0000 mg | ORAL_CAPSULE | Freq: Four times a day (QID) | ORAL | 0 refills | Status: DC

## 2024-03-18 MED ORDER — MORPHINE SULFATE (PF) 4 MG/ML IV SOLN
4.0000 mg | Freq: Once | INTRAVENOUS | Status: AC
  Administered 2024-03-18: 4 mg via INTRAVENOUS
  Filled 2024-03-18: qty 1

## 2024-03-18 MED ORDER — SODIUM CHLORIDE 0.9 % IR SOLN
3000.0000 mL | Status: DC
  Administered 2024-03-18: 3000 mL

## 2024-03-18 NOTE — ED Provider Notes (Signed)
 [facility] Provider Note    Event Date/Time   First MD Initiated Contact with Patient 03/18/24 1954     (approximate)   History   Chief Complaint Hematuria   HPI  Aaron [name] is a 73 y.o. male with past medical history of hypertension, hyperlipidemia, diabetes, CKD, and gout who presents to the ED complaining of hematuria.  Patient reports that he noticed some bleeding when he tried to urinate over the past few hours.  During this time, he has been unable to pass any urine and describes severe pain in his suprapubic area.  He does not take a blood thinner and denies any history of similar symptoms.  He was not having any hematuria or dysuria prior to this.     Physical Exam   Triage Vital Signs: ED Triage Vitals  Encounter Vitals Group     BP 03/18/24 1935 (!) 157/79     Girls Systolic BP Percentile --      Girls Diastolic BP Percentile --      Boys Systolic BP Percentile --      Boys Diastolic BP Percentile --      Pulse Rate 03/18/24 1935 84     Resp 03/18/24 1935 20     Temp 03/18/24 1935 97.8 F (36.6 C)     Temp Source 03/18/24 1935 Oral     SpO2 03/18/24 1935 100 %     Weight 03/18/24 1933 (!) 325 lb (147.4 kg)     Height 03/18/24 1933 6' 3 (1.905 m)     Head Circumference --      Peak Flow --      Pain Score 03/18/24 1933 7     Pain Loc --      Pain Education --      Exclude from Growth Chart --     Most recent vital signs: Vitals:   03/18/24 2230 03/18/24 2300  BP: (!) 108/90 125/66  Pulse: 63 68  Resp: 16   Temp:    SpO2: 100% 100%    Constitutional: Alert and oriented. Eyes: Conjunctivae are normal. Head: Atraumatic. Nose: No congestion/rhinnorhea. Mouth/Throat: Mucous membranes are moist.  Cardiovascular: Normal rate, regular rhythm. Grossly normal heart sounds.  2+ radial pulses bilaterally. Respiratory: Normal respiratory effort.  No retractions. Lungs CTAB. Gastrointestinal: Soft and tender to palpation in  the suprapubic area with no rebound or guarding.  No distention. Genitourinary: Small amount of blood at the urethral meatus. Musculoskeletal: No lower extremity tenderness nor edema.  Neurologic:  Normal speech and language. No gross focal neurologic deficits are appreciated.    ED Results / Procedures / Treatments   Labs (all labs ordered are listed, but only abnormal results are displayed) Labs Reviewed  CBC WITH DIFFERENTIAL/PLATELET - Abnormal; Notable for the following components:      Result Value   WBC 10.7 (*)    Hemoglobin 12.7 (*)    HCT 38.3 (*)    RDW 16.1 (*)    All other components within normal limits  COMPREHENSIVE METABOLIC PANEL WITH GFR - Abnormal; Notable for the following components:   Potassium 3.2 (*)    Alkaline Phosphatase 129 (*)    All other components within normal limits  URINALYSIS, ROUTINE W REFLEX MICROSCOPIC - Abnormal; Notable for the following components:   Color, Urine RED (*)    APPearance TURBID (*)    Glucose, UA   (*)    Value: TEST NOT REPORTED DUE TO COLOR INTERFERENCE  OF URINE PIGMENT   Hgb urine dipstick   (*)    Value: TEST NOT REPORTED DUE TO COLOR INTERFERENCE OF URINE PIGMENT   Bilirubin Urine   (*)    Value: TEST NOT REPORTED DUE TO COLOR INTERFERENCE OF URINE PIGMENT   Ketones, ur   (*)    Value: TEST NOT REPORTED DUE TO COLOR INTERFERENCE OF URINE PIGMENT   Protein, ur   (*)    Value: TEST NOT REPORTED DUE TO COLOR INTERFERENCE OF URINE PIGMENT   Nitrite   (*)    Value: TEST NOT REPORTED DUE TO COLOR INTERFERENCE OF URINE PIGMENT   Leukocytes,Ua   (*)    Value: TEST NOT REPORTED DUE TO COLOR INTERFERENCE OF URINE PIGMENT   All other components within normal limits  URINE CULTURE    PROCEDURES:  Critical Care performed: No  Procedures   MEDICATIONS ORDERED IN ED: Medications  sodium chloride  irrigation 0.9 % 3,000 mL (3,000 mLs Irrigation New Bag/Given 03/18/24 2114)  morphine (PF) 4 MG/ML injection 4 mg (4 mg  Intravenous Given 03/18/24 2042)     IMPRESSION / MDM / ASSESSMENT AND PLAN / ED COURSE  I reviewed the triage vital signs and the nursing notes.                              73 y.o. male with past medical history of hypertension, hyperlipidemia, diabetes, gout, and CKD who presents to the ED complaining of hematuria and difficulty urinating starting earlier this afternoon.  Patient's presentation is most consistent with acute presentation with potential threat to life or bodily function.  Differential diagnosis includes, but is not limited to, hematuria, cystitis, kidney stone, urinary retention, BPH.  Patient uncomfortable appearing but nontoxic and in no acute distress, vital signs are unremarkable.  He appears to have urinary retention due to hematuria, will place three-way Foley catheter and plan for CBI.  Labs with slight drop in hemoglobin compared to a few months ago, mild leukocytosis noted.  No electrolyte abnormality noted other than mild hypokalemia, renal function reassuring.  Three-way Foley catheter was successfully placed and patient's urine is now running almost clear following CBI.  He reports resolution of pain following catheter placement and is appropriate for discharge with outpatient urology follow-up.  Urine was sent for culture and we will prescribe course of antibiotics given acute gross hematuria.  He was counseled to return to the ED for new or worsening symptoms, patient agrees with plan.      FINAL CLINICAL IMPRESSION(S) / ED DIAGNOSES   Final diagnoses:  Gross hematuria  Urinary retention     Rx / DC Orders   ED Discharge Orders          Ordered    cephALEXin  (KEFLEX ) 500 MG capsule  4 times daily        03/18/24 2324             Note:  This document was prepared using Dragon voice recognition software and may include unintentional dictation errors.   Willo Dunnings, MD 03/18/24 [phone]

## 2024-03-18 NOTE — ED Triage Notes (Signed)
 Pt to triage via w/c with no distress noted; reports this evening having hematuria, lower abd pain and diff urinating

## 2024-03-19 NOTE — ED Notes (Signed)
Pt calling spouse for ride home.

## 2024-03-20 LAB — URINE CULTURE: Culture: NO GROWTH

## 2024-03-22 NOTE — Patient Instructions (Signed)
 Living With Sleep Apnea Sleep apnea is a condition that affects your breathing while you're sleeping. Your tongue or the tissue in your throat may block the flow of air while you sleep. You may have shallow breathing or stop breathing for short periods of time. The breaks in breathing interrupt the deep sleep that you need to feel rested. Even if you don't wake up from the gaps in breathing, you may feel tired during the day. People with sleep apnea may snore loudly. You may have a headache in the morning and feel anxious or depressed. How can sleep apnea affect me? Sleep apnea increases your chances of being very tired during the day. This is called daytime fatigue. Sleep apnea can also increase your risk of: Heart attack. Stroke. Obesity. Type 2 diabetes. Heart failure. Irregular heartbeat. High blood pressure. If you are very tired during the day, you may be more likely to: Not do well in school or at work. Fall asleep while driving. Have trouble paying attention. Develop depression or anxiety. Have problems having sex. This is called sexual dysfunction. What actions can I take to manage sleep apnea? Sleep apnea treatment  If you were given a device to open your airway while you sleep, use it only as told by your health care provider. You may be given: An oral appliance. This is a mouthpiece that shifts your lower jaw forward. A continuous positive airway pressure (CPAP) device. This blows air through a mask. A nasal expiratory positive airway pressure (EPAP) device. This has valves that you put into each nostril. A bi-level positive airway pressure (BIPAP) device. This blows air through a mask when you breathe in and breathe out. You may need surgery if other treatments don't work for you. Sleep habits Go to sleep and wake up at the same time every day. This helps set your internal clock for sleeping. If you stay up later than usual on weekends, try to get up in the morning within 2  hours of the time you usually wake up. Try to get at least 7-9 hours of sleep each night. Stop using a computer, tablet, and mobile phone a few hours before bedtime. Do not take long naps during the day. If you nap, limit it to 30 minutes. Have a relaxing bedtime routine. Reading or listening to music may relax you and help you sleep. Use your bedroom only for sleep. Keep your television and computer out of your bedroom. Keep your bedroom cool, dark, and quiet. Use a supportive mattress and pillows. Follow your provider's instructions for other changes to sleep habits. Nutrition Do not eat big meals in the evening. Do not have caffeine in the later part of the day. The effects of caffeine can last for more than 5 hours. Follow your provider's instructions for any changes to what you eat and drink. Lifestyle Do not drink alcohol before bedtime. Alcohol can cause you to fall asleep at first, but then it can cause you to wake up in the middle of the night and have trouble getting back to sleep. Do not smoke, vape, or use nicotine or tobacco. Medicines Take over-the-counter and prescription medicines only as told by your provider. Do not use over-the-counter sleep medicine. You may become dependent on this medicine, and it can make sleep apnea worse. Do not take medicines, such as sedatives and narcotics, unless told to by your provider. Activity Exercise on most days, but avoid exercising in the evening. Exercising near bedtime can interfere with sleeping.  If possible, spend time outside every day. Natural light helps with your internal clock. General information Lose weight if you need to. Stay at a healthy weight. If you are having surgery, make sure to tell your provider that you have sleep apnea. You may need to bring your device with you. Keep all follow-up visits. Your provider will want to check on your condition. Where to find more information National Heart, Lung, and Blood  Institute: [employer] This information is not intended to replace advice given to you by your health care provider. Make sure you discuss any questions you have with your health care provider. Document Revised: 08/30/2022 Document Reviewed: 08/30/2022 Elsevier Patient Education  2024 [employer].

## 2024-03-26 ENCOUNTER — Ambulatory Visit (INDEPENDENT_AMBULATORY_CARE_PROVIDER_SITE_OTHER): Admitting: Nurse Practitioner

## 2024-03-26 ENCOUNTER — Encounter: Payer: Self-pay | Admitting: Nurse Practitioner

## 2024-03-26 VITALS — BP 136/76 | HR 80 | Temp 98.2°F | Ht 74.0 in | Wt 322.2 lb

## 2024-03-26 DIAGNOSIS — G959 Disease of spinal cord, unspecified: Secondary | ICD-10-CM | POA: Insufficient documentation

## 2024-03-26 DIAGNOSIS — G4733 Obstructive sleep apnea (adult) (pediatric): Secondary | ICD-10-CM

## 2024-03-26 LAB — HEMOGLOBIN A1C: A1c: 6.8

## 2024-03-26 NOTE — Progress Notes (Signed)
 BP 136/76 (BP Location: Left Arm)   Pulse 80   Temp 98.2 F (36.8 C) (Oral)   Ht 6' 2 (1.88 m)   Wt (!) 322 lb 4 oz (146.2 kg)   SpO2 99%   BMI 41.37 kg/m    Subjective:    Patient ID: Aaron [name], male    DOB: 1950-05-31, 73 y.o.   MRN: [id number]  HPI: Aaron [name] is a 73 y.o. male  Chief Complaint  Patient presents with   Obstructive Sleep Apnea    Need new Rx to get a new machine   SLEEP APNEA Last sleep study was a few years back at [facility]. [employer] supplies his equipment. Needs new machine and all equipment as his machine is reporting a code that it will not be working soon.  Saw neurosurgery last for myelopathy, has posterior cervical laminectomy - C2-4.   Sleep apnea status: controlled Duration: chronic Satisfied with current treatment?:  yes CPAP use:  yes Sleep quality with CPAP use: excellent Treament compliance:excellent compliance Last sleep study: a few years ago Treatments attempted: CPAP Wakes feeling refreshed: sometimes Daytime hypersomnolence:  no Fatigue:  no Insomnia:  no Good sleep hygiene:  yes Difficulty falling asleep:  no Difficulty staying asleep:  no Snoring bothers bed partner:  no Observed apnea by bed partner: no Obesity:  yes Hypertension: yes  Pulmonary hypertension:  no Coronary artery disease:  no    03/26/2024   10:00 AM  Results of the Epworth flowsheet  Sitting and reading 2  Watching TV 2  Sitting, inactive in a public place (e.g. a theatre or a meeting) 2  As a passenger in a car for an hour without a break 2  Lying down to rest in the afternoon when circumstances permit 2  Sitting and talking to someone 0  Sitting quietly after a lunch without alcohol 0  In a car, while stopped for a few minutes in traffic 0  Total score 10    Relevant past medical, surgical, family and social history reviewed and updated as indicated. Interim medical history since our last visit reviewed. Allergies and medications  reviewed and updated.  Review of Systems  Constitutional:  Negative for activity change, diaphoresis, fatigue and fever.  Respiratory:  Negative for cough, chest tightness, shortness of breath and wheezing.   Cardiovascular:  Negative for chest pain, palpitations and leg swelling.  Gastrointestinal: Negative.   Endocrine: Negative for cold intolerance, heat intolerance, polydipsia, polyphagia and polyuria.  Neurological:  Negative for dizziness, syncope, weakness, light-headedness, numbness and headaches.  Psychiatric/Behavioral: Negative.      Per HPI unless specifically indicated above     Objective:    BP 136/76 (BP Location: Left Arm)   Pulse 80   Temp 98.2 F (36.8 C) (Oral)   Ht 6' 2 (1.88 m)   Wt (!) 322 lb 4 oz (146.2 kg)   SpO2 99%   BMI 41.37 kg/m   Wt Readings from Last 3 Encounters:  03/26/24 (!) 322 lb 4 oz (146.2 kg)  03/18/24 (!) 325 lb (147.4 kg)  02/26/24 (!) 325 lb 12.8 oz (147.8 kg)    Physical Exam Vitals and nursing note reviewed.  Constitutional:      General: He is awake. He is not in acute distress.    Appearance: He is well-developed and well-groomed. He is obese. He is not ill-appearing or toxic-appearing.  HENT:     Head: Normocephalic.     Right Ear: Hearing  and external ear normal.     Left Ear: Hearing and external ear normal.  Eyes:     General: Lids are normal.     Extraocular Movements: Extraocular movements intact.     Conjunctiva/sclera: Conjunctivae normal.  Neck:     Thyroid : No thyromegaly.     Vascular: No carotid bruit.  Cardiovascular:     Rate and Rhythm: Normal rate and regular rhythm.     Heart sounds: Normal heart sounds. No murmur heard.    No gallop.  Pulmonary:     Effort: No accessory muscle usage or respiratory distress.     Breath sounds: Normal breath sounds.  Abdominal:     General: Bowel sounds are normal. There is no distension.     Palpations: Abdomen is soft.     Tenderness: There is no abdominal  tenderness.  Musculoskeletal:     Cervical back: Full passive range of motion without pain.     Right lower leg: No edema.     Left lower leg: No edema.  Lymphadenopathy:     Cervical: No cervical adenopathy.  Skin:    General: Skin is warm.     Capillary Refill: Capillary refill takes less than 2 seconds.  Neurological:     Mental Status: He is alert and oriented to person, place, and time.     Deep Tendon Reflexes: Reflexes are normal and symmetric.     Reflex Scores:      Brachioradialis reflexes are 2+ on the right side and 2+ on the left side.      Patellar reflexes are 2+ on the right side and 2+ on the left side. Psychiatric:        Attention and Perception: Attention normal.        Mood and Affect: Mood normal.        Speech: Speech normal.        Behavior: Behavior normal. Behavior is cooperative.        Thought Content: Thought content normal.     Results for orders placed or performed in visit on 03/26/24  Hemoglobin A1C w/out eAG   Collection Time: 03/17/24 12:00 AM  Result Value Ref Range   A1c 6.8%       Assessment & Plan:   Problem List Items Addressed This Visit       Respiratory   Sleep apnea   Chronic, ongoing.  Continues to use CPAP faithfully every night. Last sleep study a few years back. Needs new machine and equipment, will work on obtaining this for him.        Nervous and Auditory   Myelopathy (HCC) - Primary   Stable, continue collaboration with neurosurgery. Recent notes reviewed.        Follow up plan: Return for as scheduled in January.

## 2024-03-26 NOTE — Assessment & Plan Note (Signed)
 Stable, continue collaboration with neurosurgery. Recent notes reviewed.

## 2024-03-26 NOTE — Assessment & Plan Note (Signed)
 Chronic, ongoing.  Continues to use CPAP faithfully every night. Last sleep study a few years back. Needs new machine and equipment, will work on obtaining this for him.

## 2024-03-27 NOTE — Telephone Encounter (Signed)
 Were you able to locate the report, if so can you please point me in the right direction?

## 2024-04-01 ENCOUNTER — Ambulatory Visit: Admitting: Physician Assistant

## 2024-04-01 ENCOUNTER — Other Ambulatory Visit: Payer: Self-pay

## 2024-04-01 VITALS — BP 108/66 | HR 74 | Ht 75.0 in | Wt 322.0 lb

## 2024-04-01 DIAGNOSIS — Z8546 Personal history of malignant neoplasm of prostate: Secondary | ICD-10-CM

## 2024-04-01 DIAGNOSIS — R338 Other retention of urine: Secondary | ICD-10-CM

## 2024-04-01 LAB — BLADDER SCAN AMB NON-IMAGING: Scan Result: 114 mL

## 2024-04-01 MED ORDER — SULFAMETHOXAZOLE-TRIMETHOPRIM 800-160 MG PO TABS: 1.0000 | ORAL_TABLET | Freq: Two times a day (BID) | ORAL | 0 refills | Status: AC

## 2024-04-01 NOTE — Progress Notes (Signed)
 04/01/2024 4:07 PM   Aaron [name] 10-22-1950 [id number]  CC: Chief Complaint  Patient presents with   Follow-up   HPI: Aaron [name] is a 73 y.o. 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   Foley removed in the morning; see separate note.  He returned in the afternoon. He reports he has voided several times without difficulty. PVR .  His ED visit was his first episode of gross hematuria and greater than 1 year.  PMH: Past Medical History:  Diagnosis Date   Cancer [facility]) 2013   prostate (radiation and surgery)   Diabetes mellitus without complication (HCC)    Gout    Hyperlipidemia    Hypertension    Sleep apnea    Spinal stenosis in cervical region 06/03/2020    Surgical History: Past Surgical History:  Procedure Laterality Date   CIRCUMCISION     COLONOSCOPY N/A 06/22/2021   Procedure: COLONOSCOPY;  Surgeon: Toledo, Ladell POUR, MD;  Location: [facility] ENDOSCOPY;  Service: Gastroenterology;  Laterality: N/A;  IDDM   CYST EXCISION Left 2015   arm   deviated septrum  2003   JOINT REPLACEMENT Left 2016   Hip   PROSTATE SURGERY     prostatectomy due to cancer   PROSTATECTOMY  2013   TONSILLECTOMY AND ADENOIDECTOMY  2004   TOTAL HIP ARTHROPLASTY Left 2016   TOTAL HIP ARTHROPLASTY Right 11/27/2017   Procedure: TOTAL HIP ARTHROPLASTY ANTERIOR APPROACH;  Surgeon: Kathlynn Sharper, MD;  Location: [facility] ORS;  Service: Orthopedics;  Laterality: Right;    Home Medications:  Allergies as of 04/01/2024       Reactions   Glucophage [metformin] Diarrhea        Medication List        Accurate as of April 01, 2024  4:07 PM. If you have any questions, ask your nurse or doctor.          Accu-Chek Guide test  strip Generic drug: glucose blood   allopurinol  300 MG tablet Commonly known as: ZYLOPRIM  Take 1 tablet (300 mg total) by mouth daily.   amLODipine  10 MG tablet Commonly known as: NORVASC  Take 1 tablet (10 mg total) by mouth daily.   ASPIRIN  81 PO every evening.   atorvastatin  20 MG tablet Commonly known as: LIPITOR Take 1 tablet (20 mg total) by mouth daily.   carvedilol  25 MG tablet Commonly known as: COREG  TAKE 1 TABLET TWICE DAILY WITH A MEAL   Cholecalciferol 1.25 MG (50000 UT) capsule Take 1 capsule by mouth once a week.   gabapentin  600 MG tablet Commonly known as: Neurontin  Take 1-1.5 tablets (600-900 mg total) by mouth at bedtime.   HumaLOG KwikPen 200 UNIT/ML KwikPen Generic drug: insulin  lispro Inject 75 Units into the skin 2 (two) times daily.   hydrochlorothiazide  25 MG tablet Commonly known as: HYDRODIURIL  Take 1 tablet (25 mg total) by mouth daily.   insulin  pump Soln Inject into the skin.   levothyroxine  25 MCG tablet Commonly known as: SYNTHROID  Take 1 tablet (25 mcg total) by mouth daily.   losartan  100 MG tablet Commonly known as: COZAAR  Take 1 tablet (100 mg total) by mouth daily.   tamsulosin  0.4 MG Caps capsule Commonly known as: FLOMAX  Take 1 capsule (0.4 mg  total) by mouth daily.        Allergies:  Allergies  Allergen Reactions   Glucophage [Metformin] Diarrhea    Family History: Family History  Problem Relation Age of Onset   Cancer Mother    Breast cancer Mother    Stroke Father    Cancer Sister    Cancer Maternal Uncle    Stroke Maternal Uncle     Social History:   reports that he has never smoked. He has never used smokeless tobacco. He reports that he does not drink alcohol and does not use drugs.  Physical Exam: BP 108/66 (BP Location: Left Arm, Patient Position: Sitting, Cuff Size: Large)   Pulse 74   Ht 6' 3 (1.905 m)   Wt (!) 322 lb (146.1 kg)   SpO2 97%   BMI 40.25 kg/m   Constitutional:  Alert and  oriented, no acute distress, nontoxic appearing HEENT: [place], AT Cardiovascular: No clubbing, cyanosis, or edema Respiratory: Normal respiratory effort, no increased work of breathing Skin: No rashes, bruises or suspicious lesions Neurologic: Grossly intact, no focal deficits, moving all 4 extremities Psychiatric: Normal mood and affect  Laboratory Data: Results for orders placed or performed in visit on 04/01/24  Bladder Scan (Post Void Residual) in office   Collection Time: 04/01/24  4:10 PM  Result Value Ref Range   Scan Result 114 ml   Assessment & Plan:   1. Clot retention of urine (Primary) Voiding trial passed.  Infrequent gross hematuria.  We discussed that if this becomes more frequent, we can pursue hyperbaric oxygen.  Will have him follow-up with Dr. Twylla in 6 months.  Sending in 3 days of empiric Bactrim to sterilize the urine. - Bladder Scan (Post Void Residual) in office - sulfamethoxazole-trimethoprim (BACTRIM DS) 800-160 MG tablet; Take 1 tablet by mouth 2 (two) times daily for 3 days.  Dispense: 6 tablet; Refill: 0   Return in about 6 months (around 09/29/2024) for UA + PSA with Dr. Twylla.  Lucie Hones, PA-C  [facility] [place] [address] [place], [place] [address] [phone]

## 2024-04-01 NOTE — Progress Notes (Signed)
 Catheter Removal  Patient is present today for a catheter removal.  15ml of water was drained from the balloon. A 24FR foley cath was removed from the bladder, no complications were noted. Patient tolerated well.  Performed by: Beauford Browner, CCMA  Follow up/ Additional notes: Afternoon appointment for PVR.

## 2024-04-02 ENCOUNTER — Ambulatory Visit: Admitting: Physician Assistant

## 2024-04-02 ENCOUNTER — Encounter
Admission: RE | Disposition: A | Discharge status: Home / Self Care | Payer: Self-pay | Source: Ambulatory Visit | Attending: Urology

## 2024-04-02 ENCOUNTER — Encounter: Payer: Self-pay | Admitting: Physician Assistant

## 2024-04-02 ENCOUNTER — Other Ambulatory Visit: Payer: Self-pay

## 2024-04-02 ENCOUNTER — Ambulatory Visit: Admitting: Anesthesiology

## 2024-04-02 ENCOUNTER — Encounter: Payer: Self-pay | Admitting: Urology

## 2024-04-02 ENCOUNTER — Observation Stay
Admission: RE | Admit: 2024-04-02 | Discharge: 2024-04-03 | Disposition: A | Discharge status: Home / Self Care | Source: Ambulatory Visit | Attending: Urology | Admitting: Urology

## 2024-04-02 ENCOUNTER — Ambulatory Visit (INDEPENDENT_AMBULATORY_CARE_PROVIDER_SITE_OTHER): Admitting: Urology

## 2024-04-02 VITALS — BP 186/79 | HR 91 | Temp 97.6°F | Ht 75.0 in | Wt 313.0 lb

## 2024-04-02 DIAGNOSIS — R338 Other retention of urine: Secondary | ICD-10-CM | POA: Diagnosis not present

## 2024-04-02 DIAGNOSIS — E1122 Type 2 diabetes mellitus with diabetic chronic kidney disease: Secondary | ICD-10-CM | POA: Diagnosis not present

## 2024-04-02 DIAGNOSIS — R310 Gross hematuria: Secondary | ICD-10-CM

## 2024-04-02 DIAGNOSIS — Z79899 Other long term (current) drug therapy: Secondary | ICD-10-CM | POA: Insufficient documentation

## 2024-04-02 DIAGNOSIS — I129 Hypertensive chronic kidney disease with stage 1 through stage 4 chronic kidney disease, or unspecified chronic kidney disease: Secondary | ICD-10-CM | POA: Insufficient documentation

## 2024-04-02 DIAGNOSIS — E1129 Type 2 diabetes mellitus with other diabetic kidney complication: Secondary | ICD-10-CM | POA: Diagnosis not present

## 2024-04-02 DIAGNOSIS — N3041 Irradiation cystitis with hematuria: Principal | ICD-10-CM

## 2024-04-02 DIAGNOSIS — E1169 Type 2 diabetes mellitus with other specified complication: Secondary | ICD-10-CM | POA: Diagnosis not present

## 2024-04-02 DIAGNOSIS — N183 Chronic kidney disease, stage 3 unspecified: Secondary | ICD-10-CM | POA: Insufficient documentation

## 2024-04-02 DIAGNOSIS — E113293 Type 2 diabetes mellitus with mild nonproliferative diabetic retinopathy without macular edema, bilateral: Secondary | ICD-10-CM | POA: Diagnosis not present

## 2024-04-02 DIAGNOSIS — Z794 Long term (current) use of insulin: Secondary | ICD-10-CM | POA: Insufficient documentation

## 2024-04-02 DIAGNOSIS — E1159 Type 2 diabetes mellitus with other circulatory complications: Secondary | ICD-10-CM | POA: Diagnosis not present

## 2024-04-02 HISTORY — PX: CYSTOSCOPY WITH FULGERATION: SHX6638

## 2024-04-02 LAB — TYPE AND SCREEN
ABO/RH(D): O POS
Antibody Screen: NEGATIVE

## 2024-04-02 LAB — PROTIME-INR
INR: 1.2 (ref 0.8–1.2)
Prothrombin Time: 15.7 s — ABNORMAL HIGH (ref 11.4–15.2)

## 2024-04-02 LAB — BASIC METABOLIC PANEL WITH GFR
Anion gap: 15 (ref 5–15)
BUN: 22 mg/dL (ref 8–23)
CO2: 23 mmol/L (ref 22–32)
Calcium: 9.1 mg/dL (ref 8.9–10.3)
Chloride: 102 mmol/L (ref 98–111)
Creatinine, Ser: 1.44 mg/dL — ABNORMAL HIGH (ref 0.61–1.24)
GFR, Estimated: 52 mL/min — ABNORMAL LOW (ref >60)
Glucose, Bld: 207 mg/dL — ABNORMAL HIGH (ref 70–99)
Potassium: 4.7 mmol/L (ref 3.5–5.1)
Sodium: 140 mmol/L (ref 135–145)

## 2024-04-02 LAB — CBC WITH DIFFERENTIAL/PLATELET
Abs Immature Granulocytes: 0.19 K/uL — ABNORMAL HIGH (ref 0.00–0.07)
Basophils Absolute: 0.1 K/uL (ref 0.0–0.1)
Basophils Relative: 0 %
Eosinophils Absolute: 0 K/uL (ref 0.0–0.5)
Eosinophils Relative: 0 %
HCT: 36.3 % — ABNORMAL LOW (ref 39.0–52.0)
Hemoglobin: 11.7 g/dL — ABNORMAL LOW (ref 13.0–17.0)
Immature Granulocytes: 1 %
Lymphocytes Relative: 3 %
Lymphs Abs: 0.7 K/uL (ref 0.7–4.0)
MCH: 26.4 pg (ref 26.0–34.0)
MCHC: 32.2 g/dL (ref 30.0–36.0)
MCV: 81.9 fL (ref 80.0–100.0)
Monocytes Absolute: 0.8 K/uL (ref 0.1–1.0)
Monocytes Relative: 4 %
Neutro Abs: 20 K/uL — ABNORMAL HIGH (ref 1.7–7.7)
Neutrophils Relative %: 92 %
Platelets: 419 K/uL — ABNORMAL HIGH (ref 150–400)
RBC: 4.43 MIL/uL (ref 4.22–5.81)
RDW: 15.8 % — ABNORMAL HIGH (ref 11.5–15.5)
WBC: 21.9 K/uL — ABNORMAL HIGH (ref 4.0–10.5)
nRBC: 0 % (ref 0.0–0.2)

## 2024-04-02 LAB — GLUCOSE, CAPILLARY
Glucose-Capillary: 154 mg/dL — ABNORMAL HIGH (ref 70–99)
Glucose-Capillary: 182 mg/dL — ABNORMAL HIGH (ref 70–99)
Glucose-Capillary: 199 mg/dL — ABNORMAL HIGH (ref 70–99)
Glucose-Capillary: 223 mg/dL — ABNORMAL HIGH (ref 70–99)
Glucose-Capillary: 231 mg/dL — ABNORMAL HIGH (ref 70–99)

## 2024-04-02 SURGERY — CYSTOSCOPY, WITH BLADDER FULGURATION
Anesthesia: General

## 2024-04-02 MED ORDER — CARVEDILOL 25 MG PO TABS
25.0000 mg | ORAL_TABLET | Freq: Every day | ORAL | Status: DC
  Administered 2024-04-03: 25 mg via ORAL
  Filled 2024-04-02: qty 1

## 2024-04-02 MED ORDER — TRANEXAMIC ACID-NACL 1000-0.7 MG/100ML-% IV SOLN
1000.0000 mg | Freq: Once | INTRAVENOUS | Status: AC
  Administered 2024-04-02: 1000 mg via INTRAVENOUS

## 2024-04-02 MED ORDER — PROPOFOL 10 MG/ML IV BOLUS
INTRAVENOUS | Status: AC
  Filled 2024-04-02: qty 20

## 2024-04-02 MED ORDER — CEFAZOLIN SODIUM-DEXTROSE 2-4 GM/100ML-% IV SOLN
2.0000 g | Freq: Three times a day (TID) | INTRAVENOUS | Status: DC
  Administered 2024-04-02 – 2024-04-03 (×3): 2 g via INTRAVENOUS
  Filled 2024-04-02 (×3): qty 100

## 2024-04-02 MED ORDER — FENTANYL CITRATE (PF) 100 MCG/2ML IJ SOLN
INTRAMUSCULAR | Status: AC
  Filled 2024-04-02: qty 2

## 2024-04-02 MED ORDER — SUCCINYLCHOLINE CHLORIDE 200 MG/10ML IV SOSY
PREFILLED_SYRINGE | INTRAVENOUS | Status: DC | PRN
  Administered 2024-04-02: 160 mg via INTRAVENOUS

## 2024-04-02 MED ORDER — HYDROMORPHONE HCL 1 MG/ML IJ SOLN: 0.5000 mg | INTRAMUSCULAR | Status: DC | PRN

## 2024-04-02 MED ORDER — SODIUM CHLORIDE 0.9 % IR SOLN
Status: DC | PRN
  Administered 2024-04-02: 6000 mL

## 2024-04-02 MED ORDER — AMLODIPINE BESYLATE 10 MG PO TABS
10.0000 mg | ORAL_TABLET | Freq: Every day | ORAL | Status: DC
  Administered 2024-04-03: 10 mg via ORAL
  Filled 2024-04-02: qty 1

## 2024-04-02 MED ORDER — HYDROCODONE-ACETAMINOPHEN 5-325 MG PO TABS: 1.0000 | ORAL_TABLET | ORAL | Status: DC | PRN

## 2024-04-02 MED ORDER — ONDANSETRON HCL 4 MG/2ML IJ SOLN: 4.0000 mg | INTRAMUSCULAR | Status: DC | PRN

## 2024-04-02 MED ORDER — LEVOTHYROXINE SODIUM 50 MCG PO TABS
25.0000 ug | ORAL_TABLET | Freq: Every day | ORAL | Status: DC
  Administered 2024-04-03: 25 ug via ORAL
  Filled 2024-04-02: qty 1

## 2024-04-02 MED ORDER — LIDOCAINE HCL (CARDIAC) PF 100 MG/5ML IV SOSY
PREFILLED_SYRINGE | INTRAVENOUS | Status: DC | PRN
  Administered 2024-04-02: 100 mg via INTRAVENOUS

## 2024-04-02 MED ORDER — PROPOFOL 10 MG/ML IV BOLUS
INTRAVENOUS | Status: DC | PRN
  Administered 2024-04-02: 50 mg via INTRAVENOUS
  Administered 2024-04-02: 200 mg via INTRAVENOUS
  Administered 2024-04-02: 50 mg via INTRAVENOUS

## 2024-04-02 MED ORDER — CHLORHEXIDINE GLUCONATE 0.12 % MT SOLN
OROMUCOSAL | Status: AC
Start: 2024-04-02 — End: 2024-04-02
  Filled 2024-04-02: qty 15

## 2024-04-02 MED ORDER — FENTANYL CITRATE (PF) 100 MCG/2ML IJ SOLN
INTRAMUSCULAR | Status: DC | PRN
  Administered 2024-04-02 (×4): 50 ug via INTRAVENOUS

## 2024-04-02 MED ORDER — INSULIN PUMP
Freq: Three times a day (TID) | SUBCUTANEOUS | Status: DC
  Filled 2024-04-02: qty 1

## 2024-04-02 MED ORDER — INSULIN ASPART 100 UNIT/ML IJ SOLN: 0.0000 [IU] | INTRAMUSCULAR | Status: DC

## 2024-04-02 MED ORDER — STERILE WATER FOR IRRIGATION IR SOLN
Status: DC | PRN
Start: 2024-04-02 — End: 2024-04-02
  Administered 2024-04-02 (×2): 1000 mL

## 2024-04-02 MED ORDER — OXYCODONE HCL 5 MG/5ML PO SOLN: 5.0000 mg | Freq: Once | ORAL | Status: DC | PRN

## 2024-04-02 MED ORDER — TRANEXAMIC ACID-NACL 1000-0.7 MG/100ML-% IV SOLN
INTRAVENOUS | Status: AC
  Filled 2024-04-02: qty 100

## 2024-04-02 MED ORDER — SODIUM CHLORIDE 0.9 % IV SOLN: INTRAVENOUS | Status: DC

## 2024-04-02 MED ORDER — HEPARIN SODIUM (PORCINE) 5000 UNIT/ML IJ SOLN
5000.0000 [IU] | Freq: Three times a day (TID) | INTRAMUSCULAR | Status: DC
  Administered 2024-04-03: 5000 [IU] via SUBCUTANEOUS
  Filled 2024-04-02: qty 1

## 2024-04-02 MED ORDER — OXYBUTYNIN CHLORIDE 5 MG PO TABS: 5.0000 mg | ORAL_TABLET | Freq: Three times a day (TID) | ORAL | Status: DC | PRN

## 2024-04-02 MED ORDER — ATORVASTATIN CALCIUM 20 MG PO TABS
20.0000 mg | ORAL_TABLET | Freq: Every day | ORAL | Status: DC
  Administered 2024-04-03: 20 mg via ORAL
  Filled 2024-04-02: qty 1

## 2024-04-02 MED ORDER — CHLORHEXIDINE GLUCONATE CLOTH 2 % EX PADS
6.0000 | MEDICATED_PAD | Freq: Every day | CUTANEOUS | Status: DC
  Administered 2024-04-02 – 2024-04-03 (×2): 6 via TOPICAL

## 2024-04-02 MED ORDER — ACETAMINOPHEN 10 MG/ML IV SOLN
INTRAVENOUS | Status: AC
  Filled 2024-04-02: qty 100

## 2024-04-02 MED ORDER — ACETAMINOPHEN 10 MG/ML IV SOLN: 1000.0000 mg | Freq: Once | INTRAVENOUS | Status: DC | PRN

## 2024-04-02 MED ORDER — ALLOPURINOL 100 MG PO TABS
300.0000 mg | ORAL_TABLET | Freq: Every day | ORAL | Status: DC
  Administered 2024-04-03: 300 mg via ORAL
  Filled 2024-04-02: qty 3

## 2024-04-02 MED ORDER — OXYCODONE HCL 5 MG PO TABS: 5.0000 mg | ORAL_TABLET | Freq: Once | ORAL | Status: DC | PRN

## 2024-04-02 MED ORDER — ORAL CARE MOUTH RINSE: 15.0000 mL | Freq: Once | OROMUCOSAL | Status: AC

## 2024-04-02 MED ORDER — LOSARTAN POTASSIUM 50 MG PO TABS
100.0000 mg | ORAL_TABLET | Freq: Every day | ORAL | Status: DC
  Administered 2024-04-03: 100 mg via ORAL
  Filled 2024-04-02: qty 2

## 2024-04-02 MED ORDER — ACETAMINOPHEN 325 MG PO TABS: 650.0000 mg | ORAL_TABLET | ORAL | Status: DC | PRN

## 2024-04-02 MED ORDER — ACETAMINOPHEN 10 MG/ML IV SOLN
INTRAVENOUS | Status: DC | PRN
  Administered 2024-04-02: 1000 mg via INTRAVENOUS

## 2024-04-02 MED ORDER — CHLORHEXIDINE GLUCONATE 0.12 % MT SOLN
15.0000 mL | Freq: Once | OROMUCOSAL | Status: AC
  Administered 2024-04-02: 15 mL via OROMUCOSAL

## 2024-04-02 MED ORDER — DROPERIDOL 2.5 MG/ML IJ SOLN: 0.6250 mg | Freq: Once | INTRAMUSCULAR | Status: DC | PRN

## 2024-04-02 MED ORDER — ONDANSETRON HCL 4 MG/2ML IJ SOLN
INTRAMUSCULAR | Status: DC | PRN
Start: 2024-04-02 — End: 2024-04-02
  Administered 2024-04-02: 4 mg via INTRAVENOUS

## 2024-04-02 MED ORDER — FENTANYL CITRATE (PF) 100 MCG/2ML IJ SOLN: 25.0000 ug | INTRAMUSCULAR | Status: DC | PRN

## 2024-04-02 MED ORDER — DOCUSATE SODIUM 100 MG PO CAPS
100.0000 mg | ORAL_CAPSULE | Freq: Two times a day (BID) | ORAL | Status: DC
  Administered 2024-04-02 – 2024-04-03 (×2): 100 mg via ORAL
  Filled 2024-04-02 (×2): qty 1

## 2024-04-02 MED ORDER — DEXAMETHASONE SOD PHOSPHATE PF 10 MG/ML IJ SOLN
INTRAMUSCULAR | Status: DC | PRN
  Administered 2024-04-02: 10 mg via INTRAVENOUS

## 2024-04-02 MED ORDER — GABAPENTIN 300 MG PO CAPS
600.0000 mg | ORAL_CAPSULE | Freq: Every day | ORAL | Status: DC
  Administered 2024-04-02: 600 mg via ORAL
  Filled 2024-04-02: qty 2

## 2024-04-02 MED ORDER — CEFAZOLIN SODIUM-DEXTROSE 2-4 GM/100ML-% IV SOLN
INTRAVENOUS | Status: AC
  Filled 2024-04-02: qty 100

## 2024-04-02 MED ORDER — HYDROCHLOROTHIAZIDE 25 MG PO TABS
25.0000 mg | ORAL_TABLET | Freq: Every day | ORAL | Status: DC
  Administered 2024-04-03: 25 mg via ORAL
  Filled 2024-04-02: qty 1

## 2024-04-02 SURGICAL SUPPLY — 18 items
BAG DRAIN SIEMENS DORNER NS (MISCELLANEOUS) ×1 IMPLANT
BAG URINE DRAIN 2000ML AR STRL (UROLOGICAL SUPPLIES) ×1 IMPLANT
CATH FOL 2WAY LX 18X30 (CATHETERS) ×1 IMPLANT
CATH FOL 3WAY LX COUV 24X75 (CATHETERS) IMPLANT
ELECTRD BIVAP BIPO 22/24 DONUT (ELECTROSURGICAL) IMPLANT
ELECTRODE REM PT RTRN 9FT ADLT (ELECTROSURGICAL) ×1 IMPLANT
GLOVE BIOGEL PI IND STRL 7.5 (GLOVE) ×1 IMPLANT
GOWN STRL REUS W/ TWL LRG LVL3 (GOWN DISPOSABLE) ×1 IMPLANT
GOWN STRL REUS W/ TWL XL LVL3 (GOWN DISPOSABLE) ×1 IMPLANT
GUIDEWIRE GREEN .038 145CM (MISCELLANEOUS) IMPLANT
GUIDEWIRE STR DUAL SENSOR (WIRE) IMPLANT
PACK CYSTO AR (MISCELLANEOUS) ×1 IMPLANT
SET CYSTO IRRIGATION (SET/KITS/TRAYS/PACK) ×1 IMPLANT
SOL .9 NS 3000ML IRR UROMATIC (IV SOLUTION) ×1 IMPLANT
SOLN STERILE WATER BTL 1000 ML (IV SOLUTION) ×1 IMPLANT
SYRINGE TOOMEY IRRIG 70ML (MISCELLANEOUS) ×1 IMPLANT
WATER STERILE IRR 3000ML UROMA (IV SOLUTION) ×1 IMPLANT
WATER STERILE IRR 500ML POUR (IV SOLUTION) ×1 IMPLANT

## 2024-04-02 NOTE — Plan of Care (Signed)

## 2024-04-02 NOTE — Progress Notes (Signed)
 Cystoscopy Procedure Note:  Indication: Gross hematuria, clot retention, inability to place Foley catheter  Patient seen by Lucie Hones today with hematuria and clot retention, they were unable to place a Foley catheter and I was requested to perform cystoscopy.  After informed consent and discussion of the procedure and its risks, Aaron [name] was positioned and prepped in the standard fashion. Cystoscopy was performed with a flexible cystoscope.  The proximal urethra was ragged and bloody, difficult to identify any anatomic landmarks.  Ultimately I was able to navigate the flexible cystoscope upward into the bladder and there appeared to be fairly significant clot burden.  A Super Stiff wire was placed through the scope and the scope removed.  A 24 French two-way Foley was turned into a Glass Blower/designer by cutting the tip, and advanced into the bladder over the wire with return of bloody urine.  20 mL were placed in the balloon, and the catheter was irrigated.  Urine was cherry colored and I was able to irrigate approximately 20 mL old clot, however patient had severe bladder spasms that prevented further irrigation.  Bladder scan was indicating incomplete drainage of the bladder.  Findings: Prostate surgically absent, trauma to the proximal urethra from multiple prior catheter attempts, clot retention in the bladder limiting any vision of the bladder.  Foley placed over wire.  ------------------------------------------------------------------------------  Assessment and Plan: 73 year old male with history of radical prostatectomy in 2014 at [facility] with adjuvant radiation, history of recurrent radiation cystitis, with recurrent clot retention in the last 2 weeks.   Currently with 24 French two-way Foley in place with poor drainage and high concern for residual clots within the bladder causing incomplete emptying and retention.  See clinic note for full details.   We discussed  options including observation with high risk of worsening lower abdominal pain and retention, or proceeding to the OR for cystoscopy, clot evacuation, fulguration.  Risks and benefits discussed including bleeding, infection, need for additional procedures, and risk of recurrence based on radiation cystitis.  He is in agreement for OR today.   OR today for cystoscopy, clot evacuation, fulguration Likely will benefit from outpatient hyperbaric oxygen treatment in the future  I spent 55 total minutes on the day of the encounter including pre-visit review of the medical record, face-to-face time with the patient, and post visit ordering of labs/imaging/tests.  We discussed need for add-on to operating room today for cystoscopy and clot evacuation for persistent hematuria and clot evacuation with poorly draining Foley catheter and lower abdominal pain.   Redell Burnet, MD 04/02/2024

## 2024-04-02 NOTE — Op Note (Signed)
 Date of procedure: 04/02/24  Preoperative diagnosis:  Gross hematuria Radiation cystitis  Postoperative diagnosis:  Same  Procedure: Cystoscopy, clot evacuation, fulguration  Surgeon: Redell Burnet, MD  Anesthesia: General  Complications: None  Intraoperative findings:  Prostate surgically absent, trauma to proximal urethra from prior catheters but patent ~113ml old clot evacuated free No suspicious bladder lesions, extensive radiation cystitis around the bladder neck, active bleeding at 1:00 fulgurated  EBL: 100 mL  Specimens: None  Drains: 24 French three-way Foley  Indication: Aaron [name] is a 73 y.o. patient with history of radical prostatectomy and radiation who presented to clinic today with gross hematuria and clot retention and failed catheter and irrigation.  After reviewing the management options for treatment, they elected to proceed with the above surgical procedure(s). We have discussed the potential benefits and risks of the procedure, side effects of the proposed treatment, the likelihood of the patient achieving the goals of the procedure, and any potential problems that might occur during the procedure or recuperation. Informed consent has been obtained.  Description of procedure:  The patient was taken to the operating room and general anesthesia was induced. SCDs were placed for DVT prophylaxis. The patient was placed in the dorsal lithotomy position, prepped and draped in the usual sterile fashion, and preoperative antibiotics(Ancef ) were administered. A preoperative time-out was performed.  1 g of tranexamic acid  was given by anesthesia.  A 26 French resectoscope with visual obturator was used to intubate the urethra and a normal-appearing urethra was followed proximally towards the bladder.  There was significant edema and bleeding in the proximal urethra where there had been trauma from prior catheter placement.  Prostate was surgically absent.  The  Toomey syringe was connected to the sheath and approximately old maroon clot was evacuated free from the bladder.  Thorough cystoscopy showed radiation cystitis changes throughout the bladder neck, but especially anteriorly.  Challenging cystoscopy with his morbid obesity and fixed bladder neck from prior surgery and radiation.  There was active bleeding at the 1 o'clock position at the bladder neck and this was carefully fulgurated.  Vision improved significantly.  Unable to definitively identify the ureteral orifices with radiation cystitis changes near the trigone, and fulguration minimized near the trigone and bladder neck from 9:00 to 3 o'clock position.  Most of the irritation and bleeding was coming from above the bladder neck and this was carefully fulgurated.  With the bladder decompressed no active bleeding was noted.  Careful pullback cystoscopy showed no other abnormalities within the proximal urethra and it appeared widely patent.  I was able to gently place a 24 French three-way Roush Foley into the bladder with return of clear fluid.  30 mL were placed in the balloon.  The catheter irrigated easily and remained crystal-clear.  The catheter was connected to slow CBI and drainage.  Disposition: Stable to PACU  Plan: Continue CBI to PACU, if remains clear can stop this evening Admit overnight  Redell Burnet, MD

## 2024-04-02 NOTE — H&P (Signed)
 04/02/24 1:04 PM   Aaron [name] [birth year] [id number]  CC: Gross hematuria, clot retention  HPI: 73 year old male with history of intermediate risk prostate cancer treated with robotic radical prostatectomy January 2014 at [facility], underwent adjuvant radiation based on extracapsular extension.  He has a history of intermittent gross hematuria likely secondary to radiation cystitis with most recent cystoscopy April 2024 by Dr. Twylla.  He was seen in the ER on 10/28 with gross hematuria and clot retention and a Foley was placed in the ER, and he was discharged with urology follow-up.  Foley was removed 04/01/2024 by Sheppard Hones, PA, but he developed recurrent gross hematuria and clot retention overnight.  She was unable to replace a Foley.  In clinic this morning I performed flexible cystoscopy and was able to advance a wire into the bladder and placed a 24 French two-way Foley with return of red urine.  I was able to irrigate out approximately 20 mL clots, but very limited secondary to severe bladder spasm.  Urine remained dark red and he opted to proceed 2 OR for cystoscopy, clot evacuation, and fulguration.  He is on baby aspirin  but no other anticoagulation  PMH: Past Medical History:  Diagnosis Date   Cancer (HCC) 2013   prostate (radiation and surgery)   Diabetes mellitus without complication (HCC)    Gout    Hyperlipidemia    Hypertension    Sleep apnea    Spinal stenosis in cervical region 06/03/2020    Surgical History: Past Surgical History:  Procedure Laterality Date   CIRCUMCISION     COLONOSCOPY N/A 06/22/2021   Procedure: COLONOSCOPY;  Surgeon: Toledo, Ladell POUR, MD;  Location: [facility] ENDOS[facility];  Service: Gastroenterology;  Laterality: N/A;  IDDM   CYST EXCISION Left 2015   arm   deviated septrum  2003   JOINT REPLACEMENT Left 2016   Hip   PROSTATE SURGERY     prostatectomy due to cancer   PROSTATECTOMY  2013   TONSILLECTOMY AND ADENOIDECTOMY  2004    TOTAL HIP ARTHROPLASTY Left 2016   TOTAL HIP ARTHROPLASTY Right 11/27/2017   Procedure: TOTAL HIP ARTHROPLASTY ANTERIOR APPROACH;  Surgeon: Kathlynn Sharper, MD;  Location: [facility] ORS;  Service: Orthopedics;  Laterality: Right;     Family History: Family History  Problem Relation Age of Onset   Cancer Mother    Breast cancer Mother    Stroke Father    Cancer Sister    Cancer Maternal Uncle    Stroke Maternal Uncle     Social History:  reports that he has never smoked. He has never used smokeless tobacco. He reports that he does not drink alcohol and does not use drugs.  Physical Exam:   Constitutional:  Alert and oriented, No acute distress. Cardiovascular: Regular rate and rhythm Respiratory: Clear to auscultation bilaterally GI: Abdomen is soft, nontender, nondistended, no abdominal masses Foley with strawberry colored urine  Laboratory Data: Reviewed in epic 03/18/2024 ER labs notable for normal renal function, hematocrit of 38 from a baseline of 43  Assessment & Plan:   73 year old male with history of radical prostatectomy in 2014 at [facility] with adjuvant radiation, history of recurrent radiation cystitis, with recurrent clot retention in the last 2 weeks.  Currently with 24 French two-way Foley in place with poor drainage and high concern for residual clots within the bladder causing incomplete emptying and retention.  See clinic note for full details.  We discussed options including observation with high risk of worsening lower  abdominal pain and retention, or proceeding to the OR for cystoscopy, clot evacuation, fulguration.  Risks and benefits discussed including bleeding, infection, need for additional procedures, and risk of recurrence based on radiation cystitis.  He is in agreement for OR today.  OR today for cystoscopy, clot evacuation, fulguration Likely will benefit from outpatient hyperbaric oxygen treatment in the future  Redell Burnet, MD 04/02/2024  [facility]  Urology [address], [address] [place], [place] [address] [phone]

## 2024-04-02 NOTE — Progress Notes (Signed)
 04/02/2024 11:07 AM   Aaron [name] Nose [birth year] [id number]  CC: Chief Complaint  Patient presents with   Hematuria   Urinary Retention   HPI: Aaron [name] is a 73 y.o. 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   As of yesterday afternoon he had voided several times without difficulty.  PVR was 114 mL.  Today he reports he voided several times after leaving our office, but overnight he developed recurrent gross hematuria and lost the ability to void.  He is having some discomfort this morning.  He takes aspirin  81 mg daily, no other anticoagulation.  He has not eaten anything since yesterday, but had some water to drink this morning.  PMH: Past Medical History:  Diagnosis Date   Cancer [facility]) 2013   prostate (radiation and surgery)   Diabetes mellitus without complication (HCC)    Gout    Hyperlipidemia    Hypertension    Sleep apnea    Spinal stenosis in cervical region 06/03/2020    Surgical History: Past Surgical History:  Procedure Laterality Date   CIRCUMCISION     COLONOSCOPY N/A 06/22/2021   Procedure: COLONOSCOPY;  Surgeon: Toledo, Ladell POUR, MD;  Location: [facility] END[facility];  Service: Gastroenterology;  Laterality: N/A;  IDDM   CYST EXCISION Left 2015   arm   deviated septrum  2003   JOINT REPLACEMENT Left 2016   Hip   PROSTATE SURGERY     prostatectomy due to cancer   PROSTATECTOMY  2013   TONSILLECTOMY AND ADENOIDECTOMY  2004   TOTAL HIP ARTHROPLASTY Left 2016   TOTAL HIP ARTHROPLASTY Right 11/27/2017   Procedure: TOTAL HIP ARTHROPLASTY ANTERIOR APPROACH;  Surgeon: Kathlynn Sharper, MD;  Location: [facility] ORS;  Service: Orthopedics;  Laterality: Right;    Home Medications:  Allergies as of 04/02/2024        Reactions   Glucophage [metformin] Diarrhea        Medication List        Accurate as of April 02, 2024 11:07 AM. If you have any questions, ask your nurse or doctor.          Accu-Chek Guide test strip Generic drug: glucose blood   allopurinol  300 MG tablet Commonly known as: ZYLOPRIM  Take 1 tablet (300 mg total) by mouth daily.   amLODipine  10 MG tablet Commonly known as: NORVASC  Take 1 tablet (10 mg total) by mouth daily.   ASPIRIN  81 PO every evening.   atorvastatin  20 MG tablet Commonly known as: LIPITOR Take 1 tablet (20 mg total) by mouth daily.   carvedilol  25 MG tablet Commonly known as: COREG  TAKE 1 TABLET TWICE DAILY WITH A MEAL   Cholecalciferol 1.25 MG (50000 UT) capsule Take 1 capsule by mouth once a week.   gabapentin  600 MG tablet Commonly known as: Neurontin  Take 1-1.5 tablets (600-900 mg total) by mouth at bedtime.   HumaLOG KwikPen 200 UNIT/ML KwikPen Generic drug: insulin  lispro Inject 75 Units into the skin 2 (two) times daily.   hydrochlorothiazide  25 MG tablet Commonly known as: HYDRODIURIL  Take 1 tablet (25 mg total) by mouth daily.   insulin  pump Soln Inject into the skin.   levothyroxine  25 MCG tablet Commonly known as: SYNTHROID  Take 1 tablet (25 mcg total) by mouth  daily.   losartan  100 MG tablet Commonly known as: COZAAR  Take 1 tablet (100 mg total) by mouth daily.   sulfamethoxazole-trimethoprim 800-160 MG tablet Commonly known as: BACTRIM DS Take 1 tablet by mouth 2 (two) times daily for 3 days.   tamsulosin  0.4 MG Caps capsule Commonly known as: FLOMAX  Take 1 capsule (0.4 mg total) by mouth daily.        Allergies:  Allergies  Allergen Reactions   Glucophage [Metformin] Diarrhea    Family History: Family History  Problem Relation Age of Onset   Cancer Mother    Breast cancer Mother    Stroke Father    Cancer Sister    Cancer Maternal Uncle    Stroke Maternal Uncle     Social History:    reports that he has never smoked. He has never used smokeless tobacco. He reports that he does not drink alcohol and does not use drugs.  Physical Exam: There were no vitals taken for this visit.  Constitutional:  Alert and oriented, no acute distress, nontoxic appearing HEENT: [place], AT Cardiovascular: No clubbing, cyanosis, or edema Respiratory: Normal respiratory effort, no increased work of breathing GU: Bright red blood leaking from urethral meatus Skin: No rashes, bruises or suspicious lesions Neurologic: Grossly intact, no focal deficits, moving all 4 extremities Psychiatric: Normal mood and affect  Assessment & Plan:   1. Clot retention of urine (Primary) Recurrent clot retention, anticipated to be due to radiation cystitis. Using sterile technique I attempted to place a 22Fr three-way Foley, then a 22Fr coude two-way Foley, then an 18Fr coude two-way Foley, however I met significant resistance with these and was unable to advance the catheters to the level of the bladder. I finally attempted to pass a SuperStiff wire with plans to thread a Council tip catheter over it, but met resistance with this as well. Ultimately I engaged Dr. Francisca for cysto Foley placement and he assumed care from that point , see separate note.  Return for Cysto Foley placement with Dr. Francisca.  Lucie Hones, PA-C  [facility] [place] [address], [address] [place], [place] [address] [phone]

## 2024-04-02 NOTE — Anesthesia Preprocedure Evaluation (Signed)
 Anesthesia Evaluation  Patient identified by MRN, date of birth, ID band Patient awake    Reviewed: Allergy & Precautions, H&P , NPO status , Patient's Chart, lab work & pertinent test results, reviewed documented beta blocker date and time   Airway Mallampati: III  TM Distance: >3 FB Neck ROM: full    Dental  (+) Teeth Intact   Pulmonary sleep apnea and Continuous Positive Airway Pressure Ventilation    Pulmonary exam normal        Cardiovascular Exercise Tolerance: Poor hypertension, On Medications negative cardio ROS Normal cardiovascular exam Rhythm:regular Rate:Normal     Neuro/Psych negative neurological ROS  negative psych ROS   GI/Hepatic Neg liver ROS,GERD  Medicated,,  Endo/Other  negative endocrine ROSdiabetes, Well Controlled    Renal/GU Renal disease  negative genitourinary   Musculoskeletal   Abdominal   Peds  Hematology negative hematology ROS (+)   Anesthesia Other Findings Past Medical History: 2013: Cancer (HCC)     Comment:  prostate (radiation and surgery) No date: Diabetes mellitus without complication (HCC) No date: Gout No date: Hyperlipidemia No date: Hypertension No date: Sleep apnea 06/03/2020: Spinal stenosis in cervical region Past Surgical History: No date: CIRCUMCISION 06/22/2021: COLONOSCOPY; N/A     Comment:  Procedure: COLONOSCOPY;  Surgeon: Toledo, Ladell POUR, MD;              Location: [facility];  Service: Gastroenterology;                Laterality: N/A;  IDDM 2015: CYST EXCISION; Left     Comment:  arm 2003: deviated septrum 2016: JOINT REPLACEMENT; Left     Comment:  Hip No date: PROSTATE SURGERY     Comment:  prostatectomy due to cancer 2013: PROSTATECTOMY 2004: TONSILLECTOMY AND ADENOIDECTOMY 2016: TOTAL HIP ARTHROPLASTY; Left 11/27/2017: TOTAL HIP ARTHROPLASTY; Right     Comment:  Procedure: TOTAL HIP ARTHROPLASTY ANTERIOR APPROACH;                Surgeon:  Kathlynn Sharper, MD;  Location: [facility] ORS;                Service: Orthopedics;  Laterality: Right; BMI    Body Mass Index: 40.25 kg/m     Reproductive/Obstetrics negative OB ROS                              Anesthesia Physical Anesthesia Plan  ASA: 3  Anesthesia Plan: General ETT   Post-op Pain Management:    Induction:   PONV Risk Score and Plan: 3  Airway Management Planned:   Additional Equipment:   Intra-op Plan:   Post-operative Plan:   Informed Consent: I have reviewed the patients History and Physical, chart, labs and discussed the procedure including the risks, benefits and alternatives for the proposed anesthesia with the patient or authorized representative who has indicated his/her understanding and acceptance.     Dental Advisory Given  Plan Discussed with: CRNA  Anesthesia Plan Comments:         Anesthesia Quick Evaluation

## 2024-04-02 NOTE — Transfer of Care (Signed)
 Immediate Anesthesia Transfer of Care Note  Patient: Aaron [name]  Procedure(s) Performed: CYSTOSCOPY, WITH BLADDER FULGURATION  Patient Location: PACU  Anesthesia Type:General  Level of Consciousness: awake, alert , and oriented  Airway & Oxygen Therapy: Patient Spontanous Breathing and Patient connected to face mask oxygen  Post-op Assessment: Report given to RN and Post -op Vital signs reviewed and stable  Post vital signs: Reviewed and stable  Last Vitals:  Vitals Value Taken Time  BP 151/71 04/02/24 16:31  Temp    Pulse 83 04/02/24 16:34  Resp 20 04/02/24 16:34  SpO2 100 % 04/02/24 16:34  Vitals shown include unfiled device data.  Last Pain:  Vitals:   04/02/24 1322  TempSrc: Temporal  PainSc: 7          Complications: No notable events documented.

## 2024-04-02 NOTE — Anesthesia Procedure Notes (Signed)
 Procedure Name: Intubation Date/Time: 04/02/2024 3:43 PM  Performed by: Belinda, Sims Laday, CRNAPre-anesthesia Checklist: Patient identified, Emergency Drugs available, Suction available and Patient being monitored Patient Re-evaluated:Patient Re-evaluated prior to induction Oxygen Delivery Method: Circle system utilized Preoxygenation: Pre-oxygenation with 100% oxygen Induction Type: IV induction Ventilation: Mask ventilation without difficulty and Two handed mask ventilation required Laryngoscope Size: Glidescope and 4 Grade View: Grade I Tube type: Oral Tube size: 7.5 mm Number of attempts: 1 Airway Equipment and Method: Stylet Placement Confirmation: ETT inserted through vocal cords under direct vision, positive ETCO2 and breath sounds checked- equal and bilateral Secured at: 26 cm Tube secured with: Tape Dental Injury: Teeth and Oropharynx as per pre-operative assessment  Comments: Neck maintained in neutral position before and post intubation.

## 2024-04-03 ENCOUNTER — Encounter: Payer: Self-pay | Admitting: Urology

## 2024-04-03 ENCOUNTER — Other Ambulatory Visit: Payer: Self-pay | Admitting: Physician Assistant

## 2024-04-03 DIAGNOSIS — N3040 Irradiation cystitis without hematuria: Secondary | ICD-10-CM

## 2024-04-03 DIAGNOSIS — N3041 Irradiation cystitis with hematuria: Secondary | ICD-10-CM | POA: Diagnosis not present

## 2024-04-03 LAB — GLUCOSE, CAPILLARY
Glucose-Capillary: 133 mg/dL — ABNORMAL HIGH (ref 70–99)
Glucose-Capillary: 202 mg/dL — ABNORMAL HIGH (ref 70–99)

## 2024-04-03 NOTE — Plan of Care (Signed)

## 2024-04-03 NOTE — Inpatient Diabetes Management (Signed)
 Inpatient Diabetes Program Recommendations  AACE/ADA: New Consensus Statement on Inpatient Glycemic Control (2015)  Target Ranges:  Prepandial:   less than 140 mg/dL      Peak postprandial:   less than 180 mg/dL (1-2 hours)      Critically ill patients:  140 - 180 mg/dL    Latest Reference Range & Units 04/02/24 13:03 04/02/24 16:40 04/02/24 18:11 04/02/24 19:39 04/02/24 21:52 04/03/24 00:53 04/03/24 08:40  Glucose-Capillary 70 - 99 mg/dL 845 (H) 817 (H) 800 (H) 223 (H) 231 (H) 202 (H) 133 (H)   Review of Glycemic Control  Diabetes history: DM2 Outpatient Diabetes medications: Tandem T-Slim insulin  pump with Humalog U200 insulin  and Dexcom G7 CGM Current orders for Inpatient glycemic control: Insulin  Pump AC&HS and 2am and Novolog  0-20 units Q4H  Inpatient Diabetes Program Recommendations:    Insulin : Please discontinue Novolog  0-20 units Q4H since patient is using insulin  pump for basal, correction, and carb coverage.   NOTE: Patient admitted following cystoscopy on 04/02/24. Noted patient received Decadron  10 mg at 15:46 on 04/02/24 which is contributing to hyperglycemia. Per chart review patient sees Dr. Damian and was seen on 03/17/24. Per office note on 03/17/24, patient's insulin  pump settings are:  Tandem T:slim X2 pump (started in 04/2020) and a DexCom G7 CGM. Devices were downloaded and reviewed. Basal rates Correction factor Carb ratio Target BG 12 am 2.45 units/hr 20 6.6 110 4 am 1.85 units/hr 20 6.6 110 8:30 am 1.3 units/hr 20 6.6 110 11 am 1.5 units.hr 20 5.6 110 8 pm 2.2 units/hr 20 5.6 110  24-hr basal = 43.175 units Humalog U200 insulin  (equals 86.35 units of U100 insulin )  Hb A1c is 6.8%.  Spoke with patient over the phone and he reports he has his insulin  pump on along with Dexcom CGM. Patient reports that the insulin  pump settings noted by Dr. Damian above are his settings currently and he uses Auto Mode consistently. Informed patient that he received Decadron  20  mg yesterday which is a steroid and likely to keep glucose elevated for at least 24-48 hours. Patient notes that his glucose got up in the 200 during the night but they are back down in the 100's this morning.  Informed patient that the Novolog  correction scale was ordered along with insulin  pump and it had been requested that the Novolog  correction be discontinued. Patient states the plan is for him to be discharged home today.   Patient appreciative of call and has no questions at this time.  Sent chat message to S. Vaillancourt, PA and Aida, RN to ask if Novolog  correction could be discontinued since patient is using his insulin  pump for glycemic control. S. Vaillancourt, PA gave permission to discontinue the Novolog  correction orders and notes that patient will be discharged home today.  Thanks, Earnie Gainer, RN, MSN, CDCES Diabetes Coordinator Inpatient Diabetes Program [phone] (Team Pager from 8am to 5pm)

## 2024-04-03 NOTE — Discharge Summary (Signed)
 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, radiation cystitis  Discharge diagnosis: Same as above  Secondary diagnoses:  Patient Active Problem List   Diagnosis Date Noted   Hematuria, gross 04/02/2024   Myelopathy (HCC) 03/26/2024   Constipation 04/13/2023   History of thyroidectomy, subtotal 02/05/2023   Nodule of middle lobe of right lung 02/05/2023   Gastroesophageal reflux disease without esophagitis 11/23/2021   Cervical spondylosis 11/09/2020   CKD stage 3 due to type 2 diabetes mellitus (HCC) 11/05/2020   Chronic pain syndrome 06/03/2020   Type 2 diabetes mellitus with both eyes affected by mild nonproliferative retinopathy without macular edema, with long-term current use of insulin  (HCC) 05/01/2020   Aortic atherosclerosis 05/01/2020   Type 2 diabetes mellitus with proteinuria (HCC) 01/18/2018   Morbid obesity (HCC) 01/18/2018   Presence of insulin  pump 01/18/2018   Status post total hip replacement, right 11/27/2017   Advanced care planning/counseling discussion 08/15/2017   Spinal stenosis, cervical region 02/15/2017   Sleep apnea 04/26/2015   Gout 04/26/2015   History of prostate cancer 04/26/2015   BMI 40.0-44.9, adult (HCC) 04/26/2015   Hyperlipidemia associated with type 2 diabetes mellitus (HCC)    Hypertension associated with diabetes (HCC)    History and Physical: For full details, please see admission history and physical. Briefly, Aaron [name] is a 73 y.o. year old patient admitted on 04/02/2024 from clinic for urgent cystoscopy with clot evacuation and fulguration with Dr. 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  CBI was discontinued in the PACU and this morning his urine remains clear yellow off CBI.  He has no acute concerns today.  Perioperative hemoglobin was 11.7; hematuria has since resolved.  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  He is in agreement with this plan.  Physical Exam: Constitutional:  Alert and oriented, no acute distress, nontoxic appearing HEENT: [place], AT Cardiovascular: No clubbing, cyanosis, or edema Respiratory: Normal respiratory effort, no increased work of breathing Skin: No rashes, bruises or suspicious lesions Neurologic: Grossly intact, no focal deficits, moving all 4 extremities Psychiatric: Normal mood and affect   Hospital Course: Patient tolerated the procedure well.  He was then transferred to the floor after an uneventful PACU stay.  His hospital course was uncomplicated.  On POD#1 he had met discharge criteria: was eating a regular diet, was up and ambulating independently,  pain was well controlled, hematuria had resolved and catheter was draining well, and was ready for discharge.  Laboratory values:  Recent Labs    04/02/24 1704  WBC 21.9*  HGB 11.7*  HCT 36.3*   Recent Labs    04/02/24 1704  NA 140  K 4.7  CL 102  CO2 23  GLUCOSE 207*  BUN 22  CREATININE 1.44*  CALCIUM  9.1   Recent Labs    04/02/24 1704  INR 1.2   Results for orders placed or performed during the hospital encounter of 03/18/24  Urine Culture     Status: None   Collection Time: 03/18/24  9:40 PM   Specimen: Urine, Catheterized  Result Value Ref Range Status   Specimen Description   Final    URINE, CATHETERIZED Performed at [facility], [address], [place], [place] [address]    Special Requests   Final  NONE Performed at [facility], [address], [place], [place] [address]    Culture   Final    NO GROWTH Performed at [facility], [address] [place], [place] [address]    Report Status 03/20/2024 FINAL  Final   Disposition: Home  Discharge instruction: The patient was instructed to be  ambulatory but told to refrain from heavy lifting, strenuous activity, or driving.  [place]inner care instructions provided by nursing.  Discharge medications:  Allergies as of 04/03/2024       Reactions   Glucophage [metformin] Diarrhea        Medication List     TAKE these medications    Accu-Chek Guide test strip Generic drug: glucose blood   allopurinol  300 MG tablet Commonly known as: ZYLOPRIM  Take 1 tablet (300 mg total) by mouth daily.   amLODipine  10 MG tablet Commonly known as: NORVASC  Take 1 tablet (10 mg total) by mouth daily.   ASPIRIN  81 PO every evening.   atorvastatin  20 MG tablet Commonly known as: LIPITOR Take 1 tablet (20 mg total) by mouth daily.   carvedilol  25 MG tablet Commonly known as: COREG  TAKE 1 TABLET TWICE DAILY WITH A MEAL   Cholecalciferol 1.25 MG (50000 UT) capsule Take 1 capsule by mouth once a week.   gabapentin  600 MG tablet Commonly known as: Neurontin  Take 1-1.5 tablets (600-900 mg total) by mouth at bedtime.   HumaLOG KwikPen 200 UNIT/ML KwikPen Generic drug: insulin  lispro Inject 75 Units into the skin 2 (two) times daily.   hydrochlorothiazide  25 MG tablet Commonly known as: HYDRODIURIL  Take 1 tablet (25 mg total) by mouth daily.   insulin  pump Soln Inject into the skin.   levothyroxine  25 MCG tablet Commonly known as: SYNTHROID  Take 1 tablet (25 mcg total) by mouth daily.   losartan  100 MG tablet Commonly known as: COZAAR  Take 1 tablet (100 mg total) by mouth daily.   sulfamethoxazole-trimethoprim 800-160 MG tablet Commonly known as: BACTRIM DS Take 1 tablet by mouth 2 (two) times daily for 3 days.   tamsulosin  0.4 MG Caps capsule Commonly known as: FLOMAX  Take 1 capsule (0.4 mg total) by mouth daily.        Followup:   Follow-up Information     Maurine Lukes, PA-C Follow up on 04/07/2024.   Specialty: Urology Why: For catheter removal Contact information: [address] [place]  [place] [address] [phone]

## 2024-04-03 NOTE — TOC CM/SW Note (Signed)
 Transition of Care [facility]) - Inpatient Brief Assessment   Patient Details  Name: Aaron [name] MRN: [id number] Date of Birth: 1951/01/27  Transition of Care [facility]) CM/SW Contact:    Nathanael CHRISTELLA Ring, RN Phone Number: 04/03/2024, 11:19 AM   Clinical Narrative:   Transition of Care [facility]) Screening Note   Patient Details  Name: Aaron [name] Date of Birth: [birth year]   Transition of Care [facility]) CM/SW Contact:    Nathanael CHRISTELLA Ring, RN Phone Number: 04/03/2024, 11:19 AM    Transition of Care Department [facility]) has reviewed patient and no TOC needs have been identified at this time. If new patient transition needs arise, please place a TOC consult.    Transition of Care Asessment: Insurance and Status: Insurance coverage has been reviewed Patient has primary care physician: Yes     Prior/Current Home Services: No current home services Social Drivers of Health Review: SDOH reviewed no interventions necessary Readmission risk has been reviewed: No (Obs status- no risk score generated) Transition of care needs: no transition of care needs at this time

## 2024-04-03 NOTE — Progress Notes (Signed)
 Referral to the wound clinic for HBO placed.

## 2024-04-03 NOTE — Progress Notes (Signed)
 Patient dressed, took all his personal belonging. Educate on Foley care, he did not what to switch out his drainage bag to a leg one. Wheeled off floor to dc pick up, wife is awaiting.

## 2024-04-03 NOTE — Care Management Obs Status (Signed)
 MEDICARE OBSERVATION STATUS NOTIFICATION   Patient Details  Name: Aaron [name] MRN: [id number] Date of Birth: 29-Nov-1950   Medicare Observation Status Notification Given:  Yes    Rojelio SHAUNNA Rattler 04/03/2024, 11:55 AM

## 2024-04-04 NOTE — Anesthesia Postprocedure Evaluation (Signed)
 Anesthesia Post Note  Patient: Aaron [name]  Procedure(s) Performed: CYSTOSCOPY, WITH BLADDER FULGURATION  Patient location during evaluation: PACU Anesthesia Type: General Level of consciousness: awake and alert Pain management: pain level controlled Vital Signs Assessment: post-procedure vital signs reviewed and stable Respiratory status: spontaneous breathing, nonlabored ventilation, respiratory function stable and patient connected to nasal cannula oxygen Cardiovascular status: blood pressure returned to baseline and stable Postop Assessment: no apparent nausea or vomiting Anesthetic complications: no   No notable events documented.   Last Vitals:  Vitals:   04/03/24 0403 04/03/24 0844  BP: (!) 129/58 133/65  Pulse: 72 70  Resp: 18 16  Temp: 36.5 C 36.7 C  SpO2: 98% 100%    Last Pain:  Vitals:   04/03/24 0844  TempSrc:   PainSc: 0-No pain                 [name]

## 2024-04-07 ENCOUNTER — Ambulatory Visit: Admitting: Physician Assistant

## 2024-04-07 DIAGNOSIS — Z9889 Other specified postprocedural states: Secondary | ICD-10-CM

## 2024-04-07 NOTE — Progress Notes (Addendum)
 Catheter Removal  Patient is present today for a catheter removal.  30 ml of water was drained from the balloon. A 24 FR foley 3 way cath was removed from the bladder, no complications were noted. Patient tolerated well.  Performed by: Mathew Pinal, RN  Follow up/ Additional notes: follow up on 5/11 with Dr. Twylla, HBO referral placed.

## 2024-04-11 ENCOUNTER — Encounter: Payer: Self-pay | Admitting: Nurse Practitioner

## 2024-04-15 ENCOUNTER — Ambulatory Visit (INDEPENDENT_AMBULATORY_CARE_PROVIDER_SITE_OTHER): Payer: Self-pay | Admitting: Emergency Medicine

## 2024-04-15 VITALS — Ht 75.0 in | Wt 322.0 lb

## 2024-04-15 DIAGNOSIS — Z0000 Encounter for general adult medical examination without abnormal findings: Secondary | ICD-10-CM | POA: Diagnosis not present

## 2024-04-15 DIAGNOSIS — Z1211 Encounter for screening for malignant neoplasm of colon: Secondary | ICD-10-CM

## 2024-04-15 NOTE — Progress Notes (Signed)
 Chief Complaint  Patient presents with   Medicare Wellness     Subjective:   Aaron [name] is a 73 y.o. male who presents for a Medicare Annual Wellness Visit.  Allergies (verified) Glucophage [metformin]   History: Past Medical History:  Diagnosis Date   Cancer (HCC) 2013   prostate (radiation and surgery)   Diabetes mellitus without complication (HCC)    Gout    Hyperlipidemia    Hypertension    Sleep apnea    Spinal stenosis in cervical region 06/03/2020   Past Surgical History:  Procedure Laterality Date   CIRCUMCISION     COLONOSCOPY N/A 06/22/2021   Procedure: COLONOSCOPY;  Surgeon: Toledo, Ladell POUR, MD;  Location: [facility];  Service: Gastroenterology;  Laterality: N/A;  IDDM   CYST EXCISION Left 2015   arm   CYSTOSCOPY WITH FULGERATION N/A 04/02/2024   Procedure: CYSTOSCOPY, WITH BLADDER FULGURATION;  Surgeon: Francisca Redell BROCKS, MD;  Location: [facility];  Service: Urology;  Laterality: N/A;   deviated septrum  2003   JOINT REPLACEMENT Left 2016   Hip   PROSTATE SURGERY     prostatectomy due to cancer   PROSTATECTOMY  2013   TONSILLECTOMY AND ADENOIDECTOMY  2004   TOTAL HIP ARTHROPLASTY Left 2016   TOTAL HIP ARTHROPLASTY Right 11/27/2017   Procedure: TOTAL HIP ARTHROPLASTY ANTERIOR APPROACH;  Surgeon: Kathlynn Sharper, MD;  Location: [facility] ORS;  Service: Orthopedics;  Laterality: Right;   Family History  Problem Relation Age of Onset   Cancer Mother    Breast cancer Mother    Stroke Father    Cancer Sister    Cancer Maternal Uncle    Stroke Maternal Uncle    Social History   Occupational History   Occupation: retired  Tobacco Use   Smoking status: Never   Smokeless tobacco: Never  Vaping Use   Vaping status: Never Used  Substance and Sexual Activity   Alcohol use: No   Drug use: No   Sexual activity: Yes   Tobacco Counseling Counseling given: Not Answered  SDOH Screenings   Food Insecurity: No Food Insecurity (04/15/2024)  Housing: Low  Risk  (04/15/2024)  Transportation Needs: No Transportation Needs (04/15/2024)  Utilities: Not At Risk (04/15/2024)  Alcohol Screen: Low Risk  (04/17/2023)  Depression (PHQ2-9): Low Risk  (04/15/2024)  Financial Resource Strain: Low Risk  (04/17/2023)  Physical Activity: Inactive (04/15/2024)  Social Connections: Moderately Isolated (04/15/2024)  Stress: No Stress Concern Present (04/15/2024)  Tobacco Use: Low Risk  (04/15/2024)  Health Literacy: Adequate Health Literacy (04/15/2024)   See flowsheets for full screening details  Depression Screen PHQ 2 & 9 Depression Scale- Over the past 2 weeks, how often have you been bothered by any of the following problems? Little interest or pleasure in doing things: 0 Feeling down, depressed, or hopeless (PHQ Adolescent also includes...irritable): 0 PHQ-2 Total Score: 0 Trouble falling or staying asleep, or sleeping too much: 0 Feeling tired or having little energy: 0 Poor appetite or overeating (PHQ Adolescent also includes...weight loss): 0 Feeling bad about yourself - or that you are a failure or have let yourself or your family down: 0 Trouble concentrating on things, such as reading the newspaper or watching television (PHQ Adolescent also includes...like school work): 0 Moving or speaking so slowly that other people could have noticed. Or the opposite - being so fidgety or restless that you have been moving around a lot more than usual: 0 Thoughts that you would be better off dead,  or of hurting yourself in some way: 0 PHQ-9 Total Score: 0 If you checked off any problems, how difficult have these problems made it for you to do your work, take care of things at home, or get along with other people?: Not difficult at all  Depression Treatment Depression Interventions/Treatment : EYV7-0 Score <4 Follow-up Not Indicated     Goals Addressed               This Visit's Progress     live another year (pt-stated)         Visit info /  Clinical Intake: Medicare Wellness Visit Type:: Subsequent Annual Wellness Visit Persons participating in visit:: patient Medicare Wellness Visit Mode:: Telephone If telephone:: video error Because this visit was a virtual/telehealth visit:: pt reported vitals If Telephone or Video please confirm:: I connected with the patient using audio enabled telemedicine application and verified that I am speaking with the correct person using two identifiers; I discussed the limitations of evaluation and management by telemedicine; The patient expressed understanding and agreed to proceed Patient Location:: home Provider Location:: office/clinic Information given by:: patient Interpreter Needed?: No Pre-visit prep was completed: yes AWV questionnaire completed by patient prior to visit?: no Living arrangements:: lives with spouse/significant other Patient's Overall Health Status Rating: good Typical amount of pain: none Does pain affect daily life?: no Are you currently prescribed opioids?: no  Dietary Habits and Nutritional Risks How many meals a day?: 2 Eats fruit and vegetables daily?: yes Most meals are obtained by: preparing own meals In the last 2 weeks, have you had any of the following?: none Diabetic:: (!) yes How often do you check your BS?: continuous glucose monitor (FBS this morning per patient was 38) Would you like to be referred to a Nutritionist or for Diabetic Management? : no  Functional Status Activities of Daily Living (to include ambulation/medication): Independent Ambulation: Independent with device- listed below Home Assistive Devices/Equipment: Cane; Eyeglasses (uses cane prn) Medication Administration: Independent Home Management: Independent Manage your own finances?: yes Primary transportation is: driving Concerns about vision?: no *vision screening is required for WTM* Concerns about hearing?: no  Fall Screening Falls in the past year?: 0 Number of falls in  past year: 0 Was there an injury with Fall?: 0 Fall Risk Category Calculator: 0 Patient Fall Risk Level: Low Fall Risk  Fall Risk Patient at Risk for Falls Due to: Impaired balance/gait; Impaired mobility Fall risk Follow up: Falls evaluation completed; Education provided; Falls prevention discussed  Home and Transportation Safety: All rugs have non-skid backing?: yes All stairs or steps have railings?: yes Grab bars in the bathtub or shower?: (!) no Have non-skid surface in bathtub or shower?: (!) no Good home lighting?: yes Regular seat belt use?: yes Hospital stays in the last year:: (!) yes How many hospital stays:: 3 Reason: thyroid  surgery, spinal cord surgery, blood clots in bladder  Cognitive Assessment Difficulty concentrating, remembering, or making decisions? : no Will 6CIT or Mini Cog be Completed: yes What year is it?: 0 points What month is it?: 0 points Give patient an address phrase to remember (5 components): [address] [place] About what time is it?: 0 points Count backwards from 20 to 1: 0 points Say the months of the year in reverse: 0 points Repeat the address phrase from earlier: 0 points 6 CIT Score: 0 points  Advance Directives (For Healthcare) Does Patient Have a Medical Advance Directive?: No Would patient like information on creating a  medical advance directive?: No - Patient declined  Reviewed/Updated  Reviewed/Updated: Reviewed All (Medical, Surgical, Family, Medications, Allergies, Care Teams, Patient Goals)        Objective:    Today's Vitals   04/15/24 1509  Weight: (!) 322 lb (146.1 kg)  Height: 6' 3 (1.905 m)   Body mass index is 40.25 kg/m.  Current Medications (verified) Outpatient Encounter Medications as of 04/15/2024  Medication Sig   allopurinol  (ZYLOPRIM ) 300 MG tablet Take 1 tablet (300 mg total) by mouth daily.   amLODipine  (NORVASC ) 10 MG tablet Take 1 tablet (10 mg total) by mouth daily.   ASPIRIN  81 PO every  evening.    atorvastatin  (LIPITOR) 20 MG tablet Take 1 tablet (20 mg total) by mouth daily.   carvedilol  (COREG ) 25 MG tablet TAKE 1 TABLET TWICE DAILY WITH A MEAL   Cholecalciferol 1.25 MG (50000 UT) capsule Take 1 capsule by mouth once a week.   gabapentin  (NEURONTIN ) 600 MG tablet Take 1-1.5 tablets (600-900 mg total) by mouth at bedtime. (Patient taking differently: Take 600-900 mg by mouth at bedtime. PRN)   hydrochlorothiazide  (HYDRODIURIL ) 25 MG tablet Take 1 tablet (25 mg total) by mouth daily.   Insulin  Human (INSULIN  PUMP) SOLN Inject into the skin.   insulin  lispro (HUMALOG KWIKPEN) 200 UNIT/ML KwikPen Inject 75 Units into the skin 2 (two) times daily.   levothyroxine  (SYNTHROID ) 25 MCG tablet Take 1 tablet (25 mcg total) by mouth daily.   losartan  (COZAAR ) 100 MG tablet Take 1 tablet (100 mg total) by mouth daily.   tamsulosin  (FLOMAX ) 0.4 MG CAPS capsule Take 1 capsule (0.4 mg total) by mouth daily.   ACCU-CHEK GUIDE test strip  (Patient not taking: Reported on 04/15/2024)   No facility-administered encounter medications on file as of 04/15/2024.   Hearing/Vision screen Hearing Screening - Comments:: Denies hearing loss  Vision Screening - Comments:: Needs DM eye exam. Patient to call and schedule an appointment Immunizations and Health Maintenance Health Maintenance  Topic Date Due   OPHTHALMOLOGY EXAM  09/05/2023   COVID-19 Vaccine (5 - 2025-26 season) 01/21/2024   Colonoscopy  06/22/2024   Zoster Vaccines- Shingrix  (1 of 2) 06/22/2024 (Originally 07/19/1969)   HEMOGLOBIN A1C  09/15/2024   Diabetic kidney evaluation - Urine ACR  12/04/2024   FOOT EXAM  12/04/2024   Diabetic kidney evaluation - eGFR measurement  04/02/2025   Medicare Annual Wellness (AWV)  04/15/2025   DTaP/Tdap/Td (3 - Td or Tdap) 11/24/2031   Pneumococcal Vaccine: 50+ Years  Completed   Influenza Vaccine  Completed   Hepatitis C Screening  Completed   Meningococcal B Vaccine  Aged Out         Assessment/Plan:  This is a routine wellness examination for Aaron [name].  Patient Care Team: Cannady, Jolene T, NP as PCP - General (Nurse Practitioner) Damian Therisa HERO, MD as Consulting Physician (Endocrinology) Laurice Francis NOVAK, OD (Optometry) Gilman, Ladell POUR, MD as Consulting Physician (Gastroenterology) Twylla Glendia BROCKS, MD (Urology) Lugene Ozell Faden, MD as Referring Physician (Neurosurgery) Denys Reyes Lunger, MD as Referring Physician (Otolaryngology)  I have personally reviewed and noted the following in the patient's chart:   Medical and social history Use of alcohol, tobacco or illicit drugs  Current medications and supplements including opioid prescriptions. Functional ability and status Nutritional status Physical activity Advanced directives List of other physicians Hospitalizations, surgeries, and ER visits in previous 12 months Vitals Screenings to include cognitive, depression, and falls Referrals and appointments  Orders Placed This  Encounter  Procedures   Ambulatory referral to Gastroenterology    Referral Priority:   Routine    Referral Type:   Consultation    Referral Reason:   Specialty Services Required    Referred to Provider:   Toledo, Teodoro K, MD    Requested Specialty:   Gastroenterology    Number of Visits Requested:   1   In addition, I have reviewed and discussed with patient certain preventive protocols, quality metrics, and best practice recommendations. A written personalized care plan for preventive services as well as general preventive health recommendations were provided to patient.   Vina Ned, CMA   04/15/2024   Return in 1 year (on 04/23/2025).  After Visit Summary: (MyChart) Due to this being a telephonic visit, the after visit summary with patients personalized plan was offered to patient via MyChart   Nurse Notes:  FBS this morning per patient was 20 Placed referral to [facility] for colonoscopy (due ~ 06/22/24 Needs DM  eye exam. Patient to call and schedule after the holidays Declined Covid and shingles vaccines Declined DM & Nutrition education referral

## 2024-04-15 NOTE — Patient Instructions (Signed)
 Mr. Rathbone,  Thank you for taking the time for your Medicare Wellness Visit. I appreciate your continued commitment to your health goals. Please review the care plan we discussed, and feel free to reach out if I can assist you further.  Please note that Annual Wellness Visits do not include a physical exam. Some assessments may be limited, especially if the visit was conducted virtually. If needed, we may recommend an in-person follow-up with your provider.  Ongoing Care Seeing your primary care provider every 3 to 6 months helps us  monitor your health and provide consistent, personalized care.   Referrals If a referral was made during today's visit and you haven't received any updates within two weeks, please contact the referred provider directly to check on the status.  Recommended Screenings:   Call to schedule a diabetic eye exam at your earliest convenience.  I have placed a referral to [facility] (Dr. Aundria) for a colonoscopy that is due around 05/22/24. Their ph# is [phone].   Health Maintenance  Topic Date Due   Eye exam for diabetics  09/05/2023   COVID-19 Vaccine (5 - 2025-26 season) 01/21/2024   Medicare Annual Wellness Visit  04/16/2024   Colon Cancer Screening  06/22/2024   Zoster (Shingles) Vaccine (1 of 2) 06/22/2024*   Hemoglobin A1C  09/15/2024   Yearly kidney health urinalysis for diabetes  12/04/2024   Complete foot exam   12/04/2024   Yearly kidney function blood test for diabetes  04/02/2025   DTaP/Tdap/Td vaccine (3 - Td or Tdap) 11/24/2031   Pneumococcal Vaccine for age over 65  Completed   Flu Shot  Completed   Hepatitis C Screening  Completed   Meningitis B Vaccine  Aged Out  *Topic was postponed. The date shown is not the original due date.       04/15/2024    3:15 PM  Advanced Directives  Does Patient Have a Medical Advance Directive? No  Would patient like information on creating a medical advance directive? No - Patient declined     Vision: Annual vision screenings are recommended for early detection of glaucoma, cataracts, and diabetic retinopathy. These exams can also reveal signs of chronic conditions such as diabetes and high blood pressure.  Dental: Annual dental screenings help detect early signs of oral cancer, gum disease, and other conditions linked to overall health, including heart disease and diabetes.  Please see the attached documents for additional preventive care recommendations.

## 2024-04-16 ENCOUNTER — Ambulatory Visit
Admission: RE | Admit: 2024-04-16 | Discharge: 2024-04-16 | Disposition: A | Discharge status: Home / Self Care | Source: Ambulatory Visit | Attending: Physician Assistant | Admitting: Physician Assistant

## 2024-04-16 ENCOUNTER — Other Ambulatory Visit: Payer: Self-pay | Admitting: Physician Assistant

## 2024-04-16 ENCOUNTER — Encounter: Attending: Physician Assistant | Admitting: Physician Assistant

## 2024-04-16 DIAGNOSIS — N3040 Irradiation cystitis without hematuria: Secondary | ICD-10-CM

## 2024-04-21 NOTE — Telephone Encounter (Signed)
 Pending the newer sleep study report from [facility]. On other thread asked patient to reach out to them as well to try and help bridge the gap and get us  the needed report.

## 2024-04-22 ENCOUNTER — Ambulatory Visit: Admitting: Physician Assistant

## 2024-04-22 ENCOUNTER — Other Ambulatory Visit
Admission: RE | Admit: 2024-04-22 | Discharge: 2024-04-22 | Disposition: A | Discharge status: Home / Self Care | Source: Ambulatory Visit | Attending: Physician Assistant | Admitting: Physician Assistant

## 2024-04-22 VITALS — BP 124/69 | HR 75 | Ht 75.0 in | Wt 322.0 lb

## 2024-04-22 DIAGNOSIS — R310 Gross hematuria: Secondary | ICD-10-CM

## 2024-04-22 LAB — URINALYSIS, COMPLETE
Glucose, UA: NEGATIVE
Nitrite, UA: POSITIVE — AB
Specific Gravity, UA: 1.015 (ref 1.005–1.030)
Urobilinogen, Ur: 1 mg/dL (ref 0.2–1.0)
pH, UA: 6.5 (ref 5.0–7.5)

## 2024-04-22 LAB — CBC
HCT: 31.8 % — ABNORMAL LOW (ref 39.0–52.0)
Hemoglobin: 10.4 g/dL — ABNORMAL LOW (ref 13.0–17.0)
MCH: 26.5 pg (ref 26.0–34.0)
MCHC: 32.7 g/dL (ref 30.0–36.0)
MCV: 80.9 fL (ref 80.0–100.0)
Platelets: 236 K/uL (ref 150–400)
RBC: 3.93 MIL/uL — ABNORMAL LOW (ref 4.22–5.81)
RDW: 17.2 % — ABNORMAL HIGH (ref 11.5–15.5)
WBC: 10.6 K/uL — ABNORMAL HIGH (ref 4.0–10.5)
nRBC: 0 % (ref 0.0–0.2)

## 2024-04-22 LAB — MICROSCOPIC EXAMINATION
RBC, Urine: >30 /HPF — AB (ref 0–2)
WBC, UA: >30 /HPF — AB (ref 0–5)

## 2024-04-22 LAB — BLADDER SCAN AMB NON-IMAGING

## 2024-04-22 MED ORDER — SULFAMETHOXAZOLE-TRIMETHOPRIM 800-160 MG PO TABS: 1.0000 | ORAL_TABLET | Freq: Two times a day (BID) | ORAL | 0 refills | Status: AC

## 2024-04-22 NOTE — Progress Notes (Signed)
 04/22/2024 1:59 PM   [name] 09/06/1950 [id number]  CC: Chief Complaint  Patient presents with   Hematuria   HPI: Aaron [name] is a 73 y.o. 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 Francisca on 04/02/2024 who presents today for evaluation of recurrent gross hematuria.   Today he reports his gross hematuria recurred 5 days ago.  He has been passing small clots, but feels he is still emptying well.  He is having a little bit of terminal dysuria as well.  He is scheduled to start hyperbaric oxygen for his radiation cystitis in 6 days.  In-office UA today positive for 3+ protein, 3+ blood, 2+ bilirubin, nitrates, trace ketones, and 3+ leukocytes; urine microscopy with >30 WBCs/HPF and >30 RBCs/HPF. PVR .  PMH: Past Medical History:  Diagnosis Date   Cancer [facility]) 2013   prostate (radiation and surgery)   Diabetes mellitus without complication (HCC)    Gout    Hyperlipidemia    Hypertension    Sleep apnea    Spinal stenosis in cervical region 06/03/2020    Surgical History: Past Surgical History:  Procedure Laterality Date   CIRCUMCISION     COLONOSCOPY N/A 06/22/2021   Procedure: COLONOSCOPY;  Surgeon: Toledo, Ladell POUR, MD;  Location: [facility];  Service: Gastroenterology;  Laterality: N/A;  IDDM   CYST EXCISION Left 2015   arm   CYSTOSCOPY WITH FULGERATION N/A 04/02/2024   Procedure: CYSTOSCOPY, WITH BLADDER FULGURATION;  Surgeon: Francisca Redell BROCKS, MD;  Location: [facility] ORS;  Service: Urology;  Laterality: N/A;   deviated septrum  2003   JOINT REPLACEMENT Left 2016   Hip   PROSTATE SURGERY     prostatectomy due to cancer   PROSTATECTOMY  2013   TONSILLECTOMY AND ADENOIDECTOMY  2004   TOTAL HIP ARTHROPLASTY Left 2016   TOTAL HIP  ARTHROPLASTY Right 11/27/2017   Procedure: TOTAL HIP ARTHROPLASTY ANTERIOR APPROACH;  Surgeon: Kathlynn Sharper, MD;  Location: [facility] ORS;  Service: Orthopedics;  Laterality: Right;    Home Medications:  Allergies as of 04/22/2024       Reactions   Glucophage [metformin] Diarrhea        Medication List        Accurate as of April 22, 2024  1:59 PM. If you have any questions, ask your nurse or doctor.          Accu-Chek Guide test strip Generic drug: glucose blood   allopurinol  300 MG tablet Commonly known as: ZYLOPRIM  Take 1 tablet (300 mg total) by mouth daily.   amLODipine  10 MG tablet Commonly known as: NORVASC  Take 1 tablet (10 mg total) by mouth daily.   ASPIRIN  81 PO every evening.   atorvastatin  20 MG tablet Commonly known as: LIPITOR Take 1 tablet (20 mg total) by mouth daily.   carvedilol  25 MG tablet Commonly known as: COREG  TAKE 1 TABLET TWICE DAILY WITH A MEAL   Cholecalciferol 1.25 MG (50000 UT) capsule Take 1 capsule by mouth once a week.   gabapentin  600 MG tablet Commonly known as: Neurontin  Take 1-1.5 tablets (600-900 mg total) by mouth at bedtime. What changed: additional instructions   HumaLOG KwikPen 200 UNIT/ML KwikPen Generic drug: insulin  lispro Inject 75 Units into the skin 2 (two) times daily.   hydrochlorothiazide  25 MG  tablet Commonly known as: HYDRODIURIL  Take 1 tablet (25 mg total) by mouth daily.   insulin  pump Soln Inject into the skin.   levothyroxine  25 MCG tablet Commonly known as: SYNTHROID  Take 1 tablet (25 mcg total) by mouth daily.   losartan  100 MG tablet Commonly known as: COZAAR  Take 1 tablet (100 mg total) by mouth daily.   sulfamethoxazole -trimethoprim  800-160 MG tablet Commonly known as: BACTRIM  DS Take 1 tablet by mouth 2 (two) times daily for 7 days. Started by: Abdoul Encinas   tamsulosin  0.4 MG Caps capsule Commonly known as: FLOMAX  Take 1 capsule (0.4 mg total) by mouth daily.         Allergies:  Allergies  Allergen Reactions   Glucophage [Metformin] Diarrhea    Family History: Family History  Problem Relation Age of Onset   Cancer Mother    Breast cancer Mother    Stroke Father    Cancer Sister    Cancer Maternal Uncle    Stroke Maternal Uncle     Social History:   reports that he has never smoked. He has never used smokeless tobacco. He reports that he does not drink alcohol and does not use drugs.  Physical Exam: BP 124/69   Pulse 75   Ht 6' 3 (1.905 m)   Wt (!) 322 lb (146.1 kg)   SpO2 99%   BMI 40.25 kg/m   Constitutional:  Alert and oriented, no acute distress, nontoxic appearing HEENT: [place], AT Cardiovascular: No clubbing, cyanosis, or edema Respiratory: Normal respiratory effort, no increased work of breathing Skin: No rashes, bruises or suspicious lesions Neurologic: Grossly intact, no focal deficits, moving all 4 extremities Psychiatric: Normal mood and affect  Laboratory Data: Results for orders placed or performed in visit on 04/22/24  Bladder Scan (Post Void Residual) in office   Collection Time: 04/22/24 11:38 AM  Result Value Ref Range   Scan Result    Assessment & Plan:   1. Gross hematuria (Primary) UA is suspicious, though may be consistent with radiation cystitis.  He is emptying appropriately, and I would like to avoid additional instrumentation if possible, especially in light of upcoming hyperbaric oxygen treatments.  He is in agreement with this.  Will start empiric Bactrim  and send for culture.  Also get a CBC at the hospital lab today to make sure his blood counts are stable.  We discussed pushing fluids to keep the urine dilute and reduce his risk for clot formation. - Urinalysis, Complete - Bladder Scan (Post Void Residual) in office - CULTURE, URINE COMPREHENSIVE - sulfamethoxazole -trimethoprim  (BACTRIM  DS) 800-160 MG tablet; Take 1 tablet by mouth 2 (two) times daily for 7 days.  Dispense: 14 tablet;  Refill: 0 - CBC; Future   Return for Will call with results.  Lucie Hones, PA-C  [facility] [place] [address], [address] [place], [place] [address] [phone]

## 2024-04-22 NOTE — Telephone Encounter (Signed)
 Patient is s/p C2-4 laminoplasty with Dr. Galgano on 01/22/24.  Patient is now out of the surgical global period and follow up care will transition from the surgical nurse coordinator to the clinic RN.    Izetta Minus, MSN, RN  Nurse Coordinator, Spine Surgery [facility]

## 2024-04-22 NOTE — Addendum Note (Signed)
 Addended by: ALEX SLOUGH C on: 04/22/2024 12:31 PM   Modules accepted: Orders

## 2024-04-25 LAB — CULTURE, URINE COMPREHENSIVE

## 2024-04-28 ENCOUNTER — Encounter: Admitting: Internal Medicine

## 2024-04-28 ENCOUNTER — Ambulatory Visit: Payer: Self-pay | Admitting: Physician Assistant

## 2024-04-28 DIAGNOSIS — N3041 Irradiation cystitis with hematuria: Secondary | ICD-10-CM | POA: Diagnosis present

## 2024-04-28 DIAGNOSIS — N183 Chronic kidney disease, stage 3 unspecified: Secondary | ICD-10-CM | POA: Insufficient documentation

## 2024-04-28 DIAGNOSIS — G4733 Obstructive sleep apnea (adult) (pediatric): Secondary | ICD-10-CM | POA: Insufficient documentation

## 2024-04-28 DIAGNOSIS — I129 Hypertensive chronic kidney disease with stage 1 through stage 4 chronic kidney disease, or unspecified chronic kidney disease: Secondary | ICD-10-CM | POA: Diagnosis not present

## 2024-04-28 DIAGNOSIS — E1122 Type 2 diabetes mellitus with diabetic chronic kidney disease: Secondary | ICD-10-CM | POA: Diagnosis not present

## 2024-04-28 DIAGNOSIS — R310 Gross hematuria: Secondary | ICD-10-CM | POA: Insufficient documentation

## 2024-04-28 DIAGNOSIS — Z8546 Personal history of malignant neoplasm of prostate: Secondary | ICD-10-CM | POA: Diagnosis not present

## 2024-04-28 LAB — GLUCOSE, CAPILLARY
Glucose-Capillary: 193 mg/dL — ABNORMAL HIGH (ref 70–99)
Glucose-Capillary: 218 mg/dL — ABNORMAL HIGH (ref 70–99)

## 2024-04-29 ENCOUNTER — Encounter: Admitting: Internal Medicine

## 2024-04-29 DIAGNOSIS — N3041 Irradiation cystitis with hematuria: Secondary | ICD-10-CM | POA: Diagnosis not present

## 2024-04-29 LAB — GLUCOSE, CAPILLARY
Glucose-Capillary: 135 mg/dL — ABNORMAL HIGH (ref 70–99)
Glucose-Capillary: 144 mg/dL — ABNORMAL HIGH (ref 70–99)

## 2024-05-02 ENCOUNTER — Encounter: Admitting: Internal Medicine

## 2024-05-02 DIAGNOSIS — N3041 Irradiation cystitis with hematuria: Secondary | ICD-10-CM | POA: Diagnosis not present

## 2024-05-02 LAB — GLUCOSE, CAPILLARY
Glucose-Capillary: 142 mg/dL — ABNORMAL HIGH (ref 70–99)
Glucose-Capillary: 165 mg/dL — ABNORMAL HIGH (ref 70–99)

## 2024-05-05 ENCOUNTER — Other Ambulatory Visit: Payer: Self-pay

## 2024-05-05 DIAGNOSIS — Z8546 Personal history of malignant neoplasm of prostate: Secondary | ICD-10-CM

## 2024-05-06 ENCOUNTER — Other Ambulatory Visit

## 2024-05-06 ENCOUNTER — Encounter: Admitting: Internal Medicine

## 2024-05-06 DIAGNOSIS — N3041 Irradiation cystitis with hematuria: Secondary | ICD-10-CM | POA: Diagnosis not present

## 2024-05-06 LAB — GLUCOSE, CAPILLARY
Glucose-Capillary: 89 mg/dL (ref 70–99)
Glucose-Capillary: 129 mg/dL — ABNORMAL HIGH (ref 70–99)
Glucose-Capillary: 238 mg/dL — ABNORMAL HIGH (ref 70–99)

## 2024-05-07 ENCOUNTER — Encounter: Admitting: Internal Medicine

## 2024-05-07 DIAGNOSIS — N3041 Irradiation cystitis with hematuria: Secondary | ICD-10-CM | POA: Diagnosis not present

## 2024-05-07 LAB — GLUCOSE, CAPILLARY
Glucose-Capillary: 185 mg/dL — ABNORMAL HIGH (ref 70–99)
Glucose-Capillary: 239 mg/dL — ABNORMAL HIGH (ref 70–99)

## 2024-05-08 ENCOUNTER — Encounter: Payer: Self-pay | Admitting: Physician Assistant

## 2024-05-08 ENCOUNTER — Encounter: Admitting: Internal Medicine

## 2024-05-08 DIAGNOSIS — N3041 Irradiation cystitis with hematuria: Secondary | ICD-10-CM | POA: Diagnosis not present

## 2024-05-08 LAB — GLUCOSE, CAPILLARY
Glucose-Capillary: 224 mg/dL — ABNORMAL HIGH (ref 70–99)
Glucose-Capillary: 235 mg/dL — ABNORMAL HIGH (ref 70–99)

## 2024-05-09 ENCOUNTER — Encounter: Admitting: Internal Medicine

## 2024-05-09 DIAGNOSIS — N3041 Irradiation cystitis with hematuria: Secondary | ICD-10-CM | POA: Diagnosis not present

## 2024-05-09 LAB — GLUCOSE, CAPILLARY
Glucose-Capillary: 142 mg/dL — ABNORMAL HIGH (ref 70–99)
Glucose-Capillary: 135 mg/dL — ABNORMAL HIGH (ref 70–99)

## 2024-05-10 ENCOUNTER — Other Ambulatory Visit: Payer: Self-pay | Admitting: Nurse Practitioner

## 2024-05-12 ENCOUNTER — Encounter: Admitting: Physician Assistant

## 2024-05-12 DIAGNOSIS — N3041 Irradiation cystitis with hematuria: Secondary | ICD-10-CM | POA: Diagnosis not present

## 2024-05-12 LAB — GLUCOSE, CAPILLARY
Glucose-Capillary: 103 mg/dL — ABNORMAL HIGH (ref 70–99)
Glucose-Capillary: 132 mg/dL — ABNORMAL HIGH (ref 70–99)
Glucose-Capillary: 193 mg/dL — ABNORMAL HIGH (ref 70–99)

## 2024-05-13 ENCOUNTER — Encounter: Admitting: Physician Assistant

## 2024-05-13 DIAGNOSIS — N3041 Irradiation cystitis with hematuria: Secondary | ICD-10-CM | POA: Diagnosis not present

## 2024-05-13 LAB — GLUCOSE, CAPILLARY
Glucose-Capillary: 137 mg/dL — ABNORMAL HIGH (ref 70–99)
Glucose-Capillary: 211 mg/dL — ABNORMAL HIGH (ref 70–99)

## 2024-05-14 ENCOUNTER — Encounter: Admitting: Physician Assistant

## 2024-05-16 ENCOUNTER — Encounter: Admitting: Physician Assistant

## 2024-05-19 ENCOUNTER — Encounter: Admitting: Physician Assistant

## 2024-05-19 DIAGNOSIS — N3041 Irradiation cystitis with hematuria: Secondary | ICD-10-CM | POA: Diagnosis not present

## 2024-05-19 LAB — GLUCOSE, CAPILLARY
Glucose-Capillary: 111 mg/dL — ABNORMAL HIGH (ref 70–99)
Glucose-Capillary: 148 mg/dL — ABNORMAL HIGH (ref 70–99)
Glucose-Capillary: 186 mg/dL — ABNORMAL HIGH (ref 70–99)

## 2024-05-20 ENCOUNTER — Encounter: Admitting: Physician Assistant

## 2024-05-20 ENCOUNTER — Encounter: Payer: Self-pay | Admitting: *Deleted

## 2024-05-20 DIAGNOSIS — N3041 Irradiation cystitis with hematuria: Secondary | ICD-10-CM | POA: Diagnosis not present

## 2024-05-20 LAB — GLUCOSE, CAPILLARY
Glucose-Capillary: 209 mg/dL — ABNORMAL HIGH (ref 70–99)
Glucose-Capillary: 226 mg/dL — ABNORMAL HIGH (ref 70–99)

## 2024-05-20 NOTE — Progress Notes (Signed)
 Aaron [name]                                          MRN: [id number]   05/20/2024   The [facility] Quality Team Specialist reviewed this patient medical record for the purposes of chart review for care gap closure. The following were reviewed: abstraction for care gap closure-glycemic status assessment.    VBCI Quality Team

## 2024-05-21 ENCOUNTER — Encounter: Admitting: Physician Assistant

## 2024-05-21 DIAGNOSIS — N3041 Irradiation cystitis with hematuria: Secondary | ICD-10-CM | POA: Diagnosis not present

## 2024-05-21 LAB — GLUCOSE, CAPILLARY
Glucose-Capillary: 165 mg/dL — ABNORMAL HIGH (ref 70–99)
Glucose-Capillary: 142 mg/dL — ABNORMAL HIGH (ref 70–99)

## 2024-05-23 ENCOUNTER — Encounter: Attending: Physician Assistant | Admitting: Physician Assistant

## 2024-05-23 DIAGNOSIS — G4733 Obstructive sleep apnea (adult) (pediatric): Secondary | ICD-10-CM | POA: Insufficient documentation

## 2024-05-23 DIAGNOSIS — I129 Hypertensive chronic kidney disease with stage 1 through stage 4 chronic kidney disease, or unspecified chronic kidney disease: Secondary | ICD-10-CM | POA: Insufficient documentation

## 2024-05-23 DIAGNOSIS — N3041 Irradiation cystitis with hematuria: Secondary | ICD-10-CM | POA: Diagnosis present

## 2024-05-23 DIAGNOSIS — Z8546 Personal history of malignant neoplasm of prostate: Secondary | ICD-10-CM | POA: Diagnosis not present

## 2024-05-23 DIAGNOSIS — N183 Chronic kidney disease, stage 3 unspecified: Secondary | ICD-10-CM | POA: Diagnosis not present

## 2024-05-23 DIAGNOSIS — E1122 Type 2 diabetes mellitus with diabetic chronic kidney disease: Secondary | ICD-10-CM | POA: Diagnosis not present

## 2024-05-23 DIAGNOSIS — E1169 Type 2 diabetes mellitus with other specified complication: Secondary | ICD-10-CM | POA: Diagnosis not present

## 2024-05-23 LAB — GLUCOSE, CAPILLARY
Glucose-Capillary: 128 mg/dL — ABNORMAL HIGH (ref 70–99)
Glucose-Capillary: 162 mg/dL — ABNORMAL HIGH (ref 70–99)

## 2024-05-24 ENCOUNTER — Other Ambulatory Visit: Payer: Self-pay | Admitting: Nurse Practitioner

## 2024-05-26 ENCOUNTER — Encounter: Admitting: Physician Assistant

## 2024-05-26 DIAGNOSIS — N3041 Irradiation cystitis with hematuria: Secondary | ICD-10-CM | POA: Diagnosis not present

## 2024-05-26 LAB — GLUCOSE, CAPILLARY
Glucose-Capillary: 135 mg/dL — ABNORMAL HIGH (ref 70–99)
Glucose-Capillary: 109 mg/dL — ABNORMAL HIGH (ref 70–99)

## 2024-05-27 ENCOUNTER — Encounter: Admitting: Physician Assistant

## 2024-05-27 DIAGNOSIS — N3041 Irradiation cystitis with hematuria: Secondary | ICD-10-CM | POA: Diagnosis not present

## 2024-05-27 LAB — GLUCOSE, CAPILLARY
Glucose-Capillary: 179 mg/dL — ABNORMAL HIGH (ref 70–99)
Glucose-Capillary: 180 mg/dL — ABNORMAL HIGH (ref 70–99)

## 2024-05-27 NOTE — Telephone Encounter (Signed)
 Requested Prescriptions  Refused Prescriptions Disp Refills   levothyroxine  (SYNTHROID ) 25 MCG tablet [Pharmacy Med Name: Levothyroxine  Sodium 25 MCG Oral Tablet] 100 tablet 2    Sig: TAKE 1 TABLET BY MOUTH DAILY     Endocrinology:  Hypothyroid Agents Failed - 05/27/2024  7:51 AM      Failed - TSH in normal range and within 360 days    TSH  Date Value Ref Range Status  12/24/2023 18.700 (H) 0.450 - 4.500 uIU/mL Final         Passed - Valid encounter within last 12 months    Recent Outpatient Visits           2 months ago Myelopathy (HCC)   [place] [facility] Carnegie, [place] T, NP   3 months ago Acute cystitis with hematuria   [place] [facility] Melvin Pao, NP   5 months ago Type 2 diabetes mellitus with both eyes affected by mild nonproliferative retinopathy without macular edema, with long-term current use of insulin  (HCC)   [place] [facility] Valerio Melanie DASEN, NP       Future Appointments             In 4 months Stoioff, Glendia BROCKS, MD [facility] [place]

## 2024-05-28 ENCOUNTER — Encounter: Admitting: Physician Assistant

## 2024-05-28 DIAGNOSIS — N3041 Irradiation cystitis with hematuria: Secondary | ICD-10-CM | POA: Diagnosis not present

## 2024-05-28 LAB — GLUCOSE, CAPILLARY
Glucose-Capillary: 190 mg/dL — ABNORMAL HIGH (ref 70–99)
Glucose-Capillary: 179 mg/dL — ABNORMAL HIGH (ref 70–99)

## 2024-05-29 ENCOUNTER — Encounter: Admitting: Physician Assistant

## 2024-05-29 DIAGNOSIS — N3041 Irradiation cystitis with hematuria: Secondary | ICD-10-CM | POA: Diagnosis not present

## 2024-05-29 LAB — GLUCOSE, CAPILLARY
Glucose-Capillary: 198 mg/dL — ABNORMAL HIGH (ref 70–99)
Glucose-Capillary: 198 mg/dL — ABNORMAL HIGH (ref 70–99)

## 2024-05-30 ENCOUNTER — Encounter: Admitting: Physician Assistant

## 2024-05-30 DIAGNOSIS — N3041 Irradiation cystitis with hematuria: Secondary | ICD-10-CM | POA: Diagnosis not present

## 2024-05-30 LAB — GLUCOSE, CAPILLARY
Glucose-Capillary: 126 mg/dL — ABNORMAL HIGH (ref 70–99)
Glucose-Capillary: 155 mg/dL — ABNORMAL HIGH (ref 70–99)

## 2024-05-31 NOTE — Patient Instructions (Incomplete)

## 2024-06-02 ENCOUNTER — Encounter: Admitting: Physician Assistant

## 2024-06-02 DIAGNOSIS — N3041 Irradiation cystitis with hematuria: Secondary | ICD-10-CM | POA: Diagnosis not present

## 2024-06-02 LAB — GLUCOSE, CAPILLARY
Glucose-Capillary: 167 mg/dL — ABNORMAL HIGH (ref 70–99)
Glucose-Capillary: 169 mg/dL — ABNORMAL HIGH (ref 70–99)

## 2024-06-03 ENCOUNTER — Encounter: Admitting: Physician Assistant

## 2024-06-03 DIAGNOSIS — N3041 Irradiation cystitis with hematuria: Secondary | ICD-10-CM | POA: Diagnosis not present

## 2024-06-03 LAB — GLUCOSE, CAPILLARY
Glucose-Capillary: 136 mg/dL — ABNORMAL HIGH (ref 70–99)
Glucose-Capillary: 157 mg/dL — ABNORMAL HIGH (ref 70–99)

## 2024-06-04 ENCOUNTER — Encounter: Admitting: Physician Assistant

## 2024-06-04 DIAGNOSIS — N3041 Irradiation cystitis with hematuria: Secondary | ICD-10-CM | POA: Diagnosis not present

## 2024-06-04 LAB — GLUCOSE, CAPILLARY
Glucose-Capillary: 163 mg/dL — ABNORMAL HIGH (ref 70–99)
Glucose-Capillary: 211 mg/dL — ABNORMAL HIGH (ref 70–99)

## 2024-06-05 ENCOUNTER — Encounter: Admitting: Physician Assistant

## 2024-06-06 ENCOUNTER — Encounter: Admitting: Physician Assistant

## 2024-06-06 ENCOUNTER — Ambulatory Visit: Admitting: Nurse Practitioner

## 2024-06-06 DIAGNOSIS — Z6841 Body Mass Index (BMI) 40.0 and over, adult: Secondary | ICD-10-CM

## 2024-06-06 DIAGNOSIS — E113293 Type 2 diabetes mellitus with mild nonproliferative diabetic retinopathy without macular edema, bilateral: Secondary | ICD-10-CM

## 2024-06-06 DIAGNOSIS — I152 Hypertension secondary to endocrine disorders: Secondary | ICD-10-CM

## 2024-06-06 DIAGNOSIS — N183 Chronic kidney disease, stage 3 unspecified: Secondary | ICD-10-CM

## 2024-06-06 DIAGNOSIS — N3041 Irradiation cystitis with hematuria: Secondary | ICD-10-CM | POA: Diagnosis not present

## 2024-06-06 DIAGNOSIS — G894 Chronic pain syndrome: Secondary | ICD-10-CM

## 2024-06-06 DIAGNOSIS — Z9641 Presence of insulin pump (external) (internal): Secondary | ICD-10-CM

## 2024-06-06 DIAGNOSIS — G959 Disease of spinal cord, unspecified: Secondary | ICD-10-CM

## 2024-06-06 DIAGNOSIS — E1169 Type 2 diabetes mellitus with other specified complication: Secondary | ICD-10-CM

## 2024-06-06 DIAGNOSIS — G4733 Obstructive sleep apnea (adult) (pediatric): Secondary | ICD-10-CM

## 2024-06-06 DIAGNOSIS — I700 Atherosclerosis of aorta: Secondary | ICD-10-CM

## 2024-06-06 DIAGNOSIS — E1129 Type 2 diabetes mellitus with other diabetic kidney complication: Secondary | ICD-10-CM

## 2024-06-06 LAB — GLUCOSE, CAPILLARY
Glucose-Capillary: 166 mg/dL — ABNORMAL HIGH (ref 70–99)
Glucose-Capillary: 176 mg/dL — ABNORMAL HIGH (ref 70–99)

## 2024-06-09 ENCOUNTER — Encounter: Admitting: Physician Assistant

## 2024-06-09 DIAGNOSIS — N3041 Irradiation cystitis with hematuria: Secondary | ICD-10-CM | POA: Diagnosis not present

## 2024-06-09 LAB — GLUCOSE, CAPILLARY
Glucose-Capillary: 174 mg/dL — ABNORMAL HIGH (ref 70–99)
Glucose-Capillary: 228 mg/dL — ABNORMAL HIGH (ref 70–99)

## 2024-06-10 ENCOUNTER — Encounter: Admitting: Physician Assistant

## 2024-06-10 DIAGNOSIS — N3041 Irradiation cystitis with hematuria: Secondary | ICD-10-CM | POA: Diagnosis not present

## 2024-06-10 LAB — GLUCOSE, CAPILLARY
Glucose-Capillary: 147 mg/dL — ABNORMAL HIGH (ref 70–99)
Glucose-Capillary: 133 mg/dL — ABNORMAL HIGH (ref 70–99)

## 2024-06-11 ENCOUNTER — Encounter: Admitting: Physician Assistant

## 2024-06-11 DIAGNOSIS — N3041 Irradiation cystitis with hematuria: Secondary | ICD-10-CM | POA: Diagnosis not present

## 2024-06-11 LAB — GLUCOSE, CAPILLARY
Glucose-Capillary: 129 mg/dL — ABNORMAL HIGH (ref 70–99)
Glucose-Capillary: 131 mg/dL — ABNORMAL HIGH (ref 70–99)

## 2024-06-12 ENCOUNTER — Encounter: Admitting: Physician Assistant

## 2024-06-12 DIAGNOSIS — N3041 Irradiation cystitis with hematuria: Secondary | ICD-10-CM | POA: Diagnosis not present

## 2024-06-12 LAB — GLUCOSE, CAPILLARY
Glucose-Capillary: 145 mg/dL — ABNORMAL HIGH (ref 70–99)
Glucose-Capillary: 123 mg/dL — ABNORMAL HIGH (ref 70–99)

## 2024-06-13 ENCOUNTER — Encounter: Admitting: Physician Assistant

## 2024-06-13 NOTE — Progress Notes (Signed)
 Aaron [name]                                          MRN: [id number]   06/13/2024   The VBCI Quality Team Specialist reviewed this patient medical record for the purposes of chart review for care gap closure. The following were reviewed: chart review for care gap closure-glycemic status assessment.    VBCI Quality Team

## 2024-06-16 ENCOUNTER — Encounter: Admitting: Physician Assistant

## 2024-06-17 ENCOUNTER — Encounter: Admitting: Physician Assistant

## 2024-06-18 ENCOUNTER — Encounter: Admitting: Physician Assistant

## 2024-06-19 ENCOUNTER — Encounter: Admitting: Physician Assistant

## 2024-06-20 ENCOUNTER — Encounter: Admitting: Physician Assistant

## 2024-06-23 ENCOUNTER — Encounter: Admitting: Physician Assistant

## 2024-06-24 ENCOUNTER — Encounter: Admitting: Physician Assistant

## 2024-06-25 ENCOUNTER — Encounter: Admitting: Physician Assistant

## 2024-06-26 ENCOUNTER — Encounter: Admitting: Physician Assistant

## 2024-06-27 ENCOUNTER — Encounter: Admitting: Physician Assistant

## 2024-06-30 ENCOUNTER — Encounter: Admitting: Physician Assistant

## 2024-07-01 ENCOUNTER — Encounter: Admitting: Physician Assistant

## 2024-07-02 ENCOUNTER — Encounter: Admitting: Physician Assistant

## 2024-08-08 ENCOUNTER — Ambulatory Visit: Admitting: Nurse Practitioner

## 2024-09-29 ENCOUNTER — Ambulatory Visit: Admitting: Urology

## 2025-04-23 ENCOUNTER — Ambulatory Visit
